# Patient Record
Sex: Female | Born: 1945
Health system: Southern US, Community
[De-identification: ages and names within clinical notes are randomized; demographics above are authoritative.]

## PROBLEM LIST (undated history)

## (undated) DIAGNOSIS — F329 Major depressive disorder, single episode, unspecified: Secondary | ICD-10-CM

## (undated) DIAGNOSIS — E785 Hyperlipidemia, unspecified: Secondary | ICD-10-CM

## (undated) DIAGNOSIS — E119 Type 2 diabetes mellitus without complications: Secondary | ICD-10-CM

## (undated) DIAGNOSIS — R519 Headache, unspecified: Secondary | ICD-10-CM

## (undated) DIAGNOSIS — K219 Gastro-esophageal reflux disease without esophagitis: Secondary | ICD-10-CM

## (undated) DIAGNOSIS — F32A Depression, unspecified: Secondary | ICD-10-CM

## (undated) DIAGNOSIS — N189 Chronic kidney disease, unspecified: Secondary | ICD-10-CM

## (undated) DIAGNOSIS — G4730 Sleep apnea, unspecified: Secondary | ICD-10-CM

## (undated) DIAGNOSIS — R51 Headache: Secondary | ICD-10-CM

## (undated) DIAGNOSIS — M797 Fibromyalgia: Secondary | ICD-10-CM

## (undated) DIAGNOSIS — J189 Pneumonia, unspecified organism: Secondary | ICD-10-CM

## (undated) DIAGNOSIS — R0600 Dyspnea, unspecified: Secondary | ICD-10-CM

## (undated) DIAGNOSIS — F419 Anxiety disorder, unspecified: Secondary | ICD-10-CM

## (undated) DIAGNOSIS — I10 Essential (primary) hypertension: Secondary | ICD-10-CM

## (undated) DIAGNOSIS — M1990 Unspecified osteoarthritis, unspecified site: Secondary | ICD-10-CM

## (undated) HISTORY — PX: CHOLECYSTECTOMY: SHX55

## (undated) HISTORY — PX: APPENDECTOMY: SHX54

## (undated) HISTORY — PX: ESOPHAGOGASTRODUODENOSCOPY: SHX1529

## (undated) HISTORY — PX: ABDOMINAL HYSTERECTOMY: SHX81

## (undated) HISTORY — PX: OTHER SURGICAL HISTORY: SHX169

## (undated) HISTORY — PX: COLONOSCOPY: SHX174

## (undated) HISTORY — PX: FLEXIBLE SIGMOIDOSCOPY: SHX1649

---

## 1974-12-19 HISTORY — PX: TOTAL VAGINAL HYSTERECTOMY: SHX2548

## 1999-04-20 ENCOUNTER — Other Ambulatory Visit: Admission: RE | Admit: 1999-04-20 | Discharge: 1999-04-20 | Payer: Self-pay | Admitting: Gastroenterology

## 1999-07-13 ENCOUNTER — Ambulatory Visit (HOSPITAL_COMMUNITY): Admission: RE | Admit: 1999-07-13 | Discharge: 1999-07-13 | Payer: Self-pay | Admitting: Gastroenterology

## 1999-07-13 ENCOUNTER — Encounter: Payer: Self-pay | Admitting: Gastroenterology

## 1999-08-09 ENCOUNTER — Encounter: Payer: Self-pay | Admitting: Gastroenterology

## 1999-08-09 ENCOUNTER — Ambulatory Visit (HOSPITAL_COMMUNITY): Admission: RE | Admit: 1999-08-09 | Discharge: 1999-08-09 | Payer: Self-pay | Admitting: Gastroenterology

## 2000-03-22 ENCOUNTER — Encounter: Payer: Self-pay | Admitting: Gastroenterology

## 2000-03-22 ENCOUNTER — Ambulatory Visit (HOSPITAL_COMMUNITY): Admission: RE | Admit: 2000-03-22 | Discharge: 2000-03-22 | Payer: Self-pay | Admitting: Gastroenterology

## 2004-09-20 ENCOUNTER — Ambulatory Visit: Payer: Self-pay | Admitting: Internal Medicine

## 2004-10-21 ENCOUNTER — Other Ambulatory Visit: Payer: Self-pay

## 2004-10-21 ENCOUNTER — Emergency Department: Payer: Self-pay | Admitting: Emergency Medicine

## 2004-10-21 IMAGING — CT CT CHEST W/ CM
1 of 2 series · 15 of 32 positions shown, 19 images · IV contrast (APPLIED)
Comparison: none

REASON FOR EXAM: pe
COMMENTS:

[Series 6: inspace · axial · 0.66mm/px · z∈[-303,-33]mm · 15 of 425 slices shown, 19 images]
[im 20/425  mediastinal]
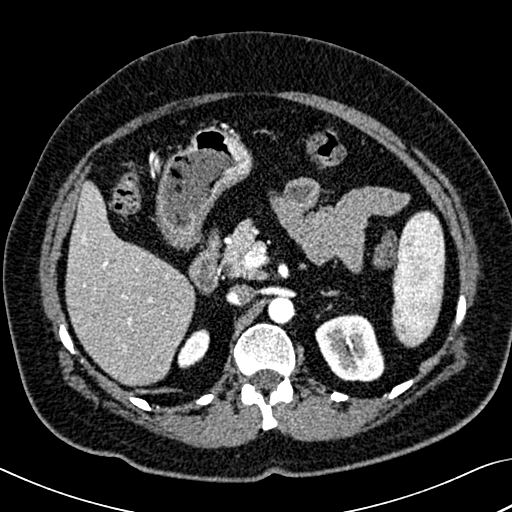
[im 20/425  lung]
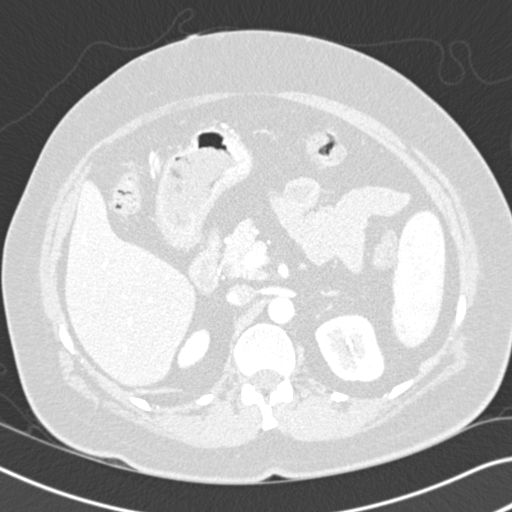
[im 58/425  lung]
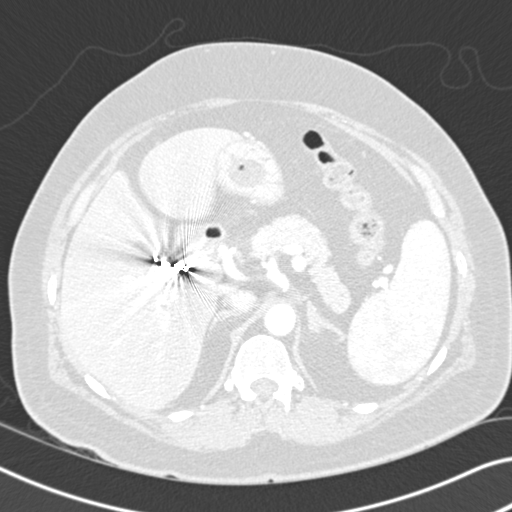
[im 97/425  lung]
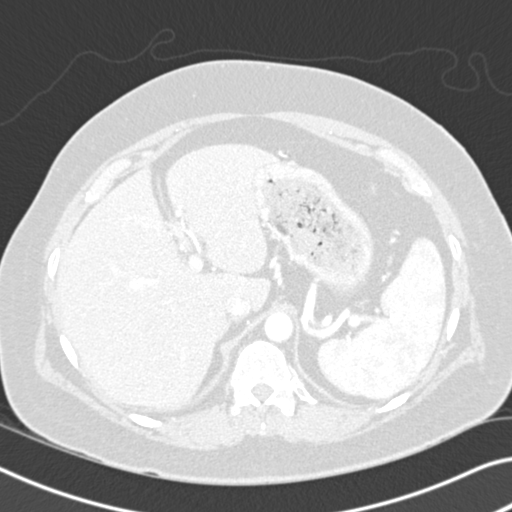
[im 107/425  lung]
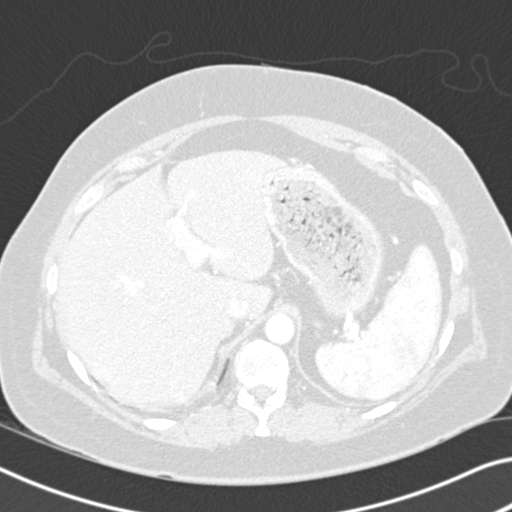
[im 135/425  mediastinal]
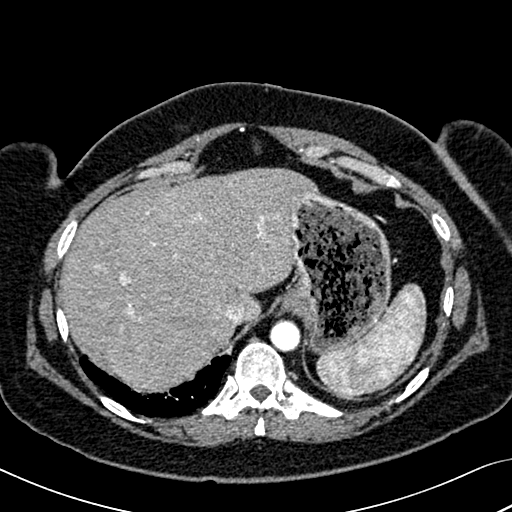
[im 135/425  lung]
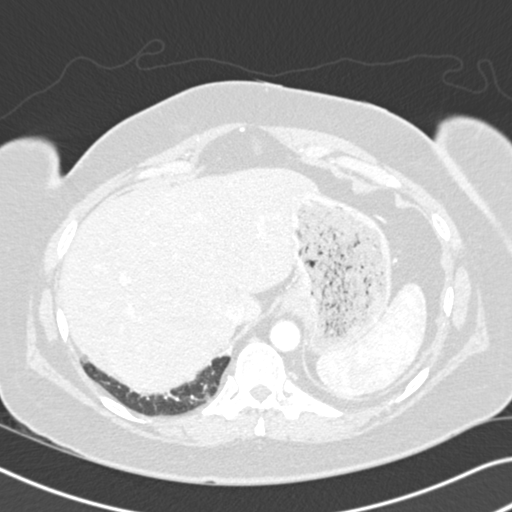
[im 155/425  lung]
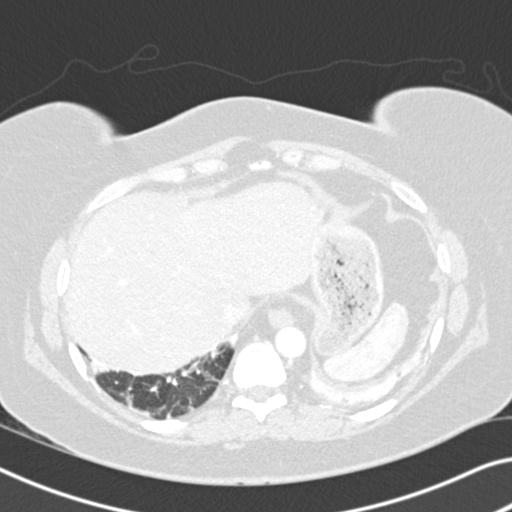
[im 193/425  lung]
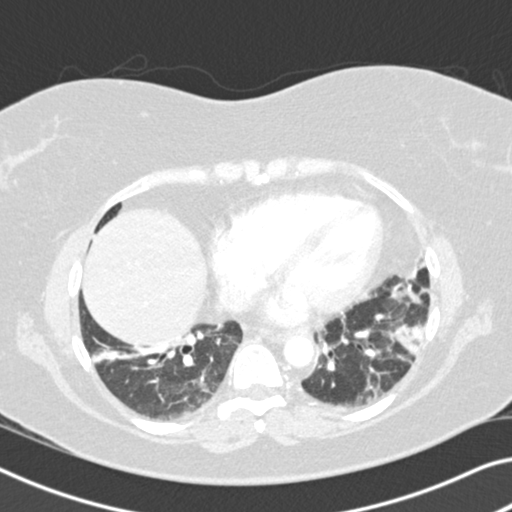
[im 213/425  lung]
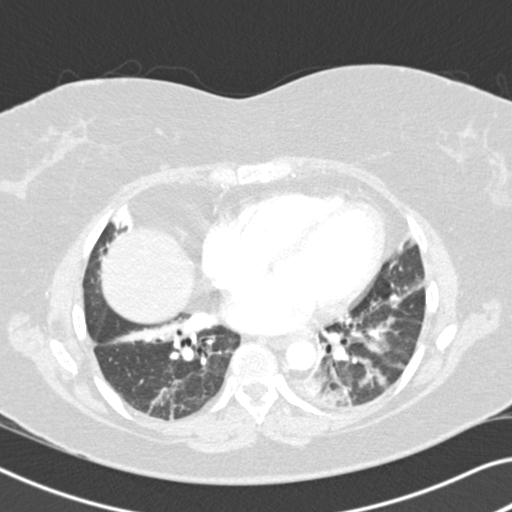
[im 232/425  mediastinal]
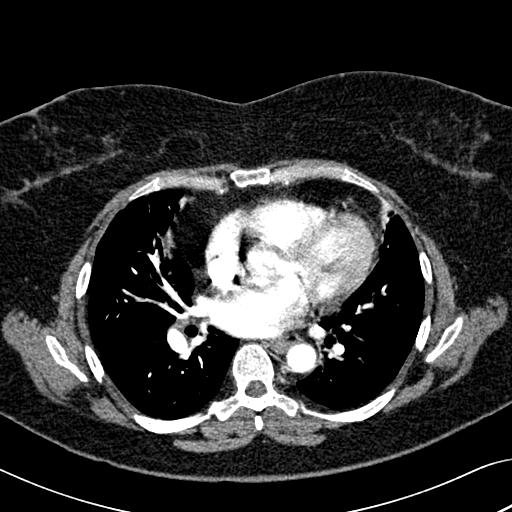
[im 232/425  lung]
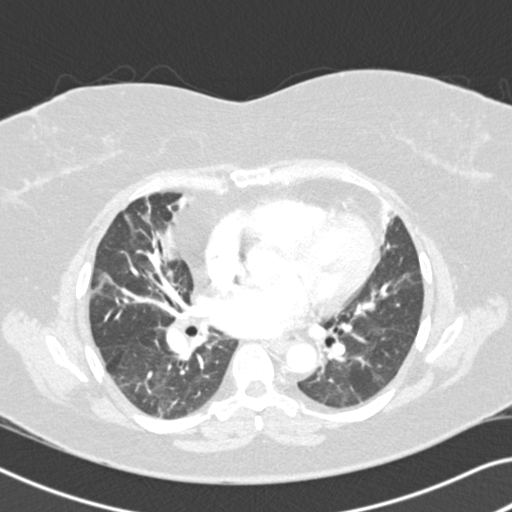
[im 270/425  lung]
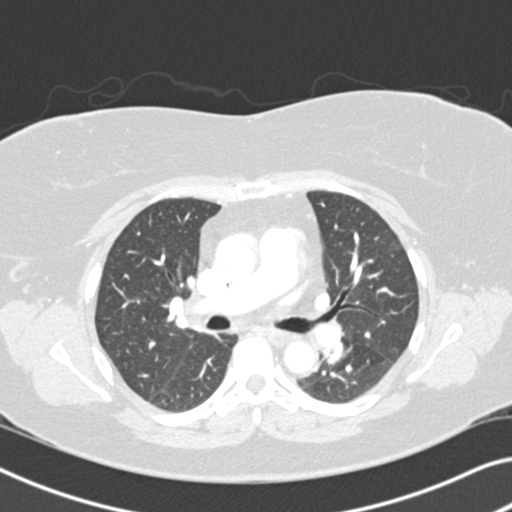
[im 290/425  lung]
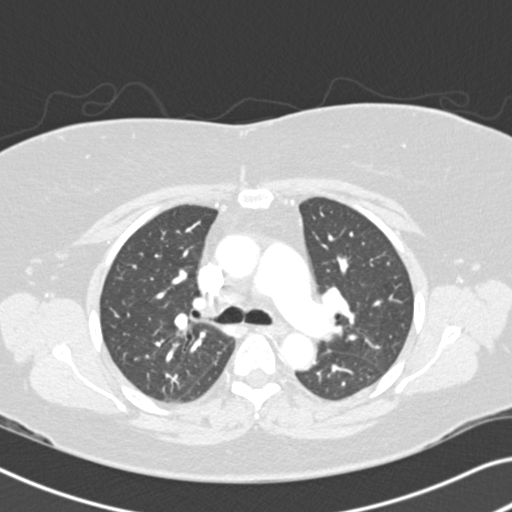
[im 319/425  lung]
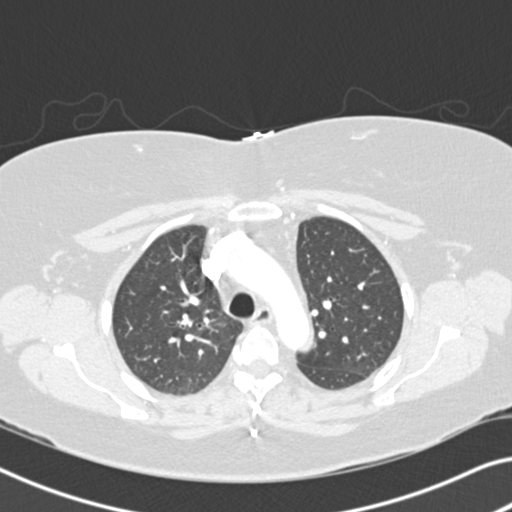
[im 347/425  mediastinal]
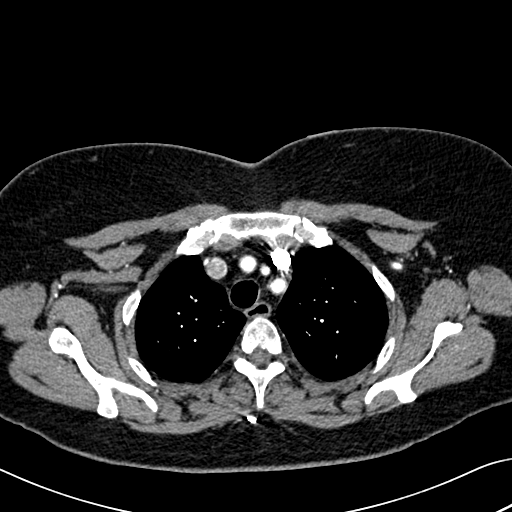
[im 347/425  lung]
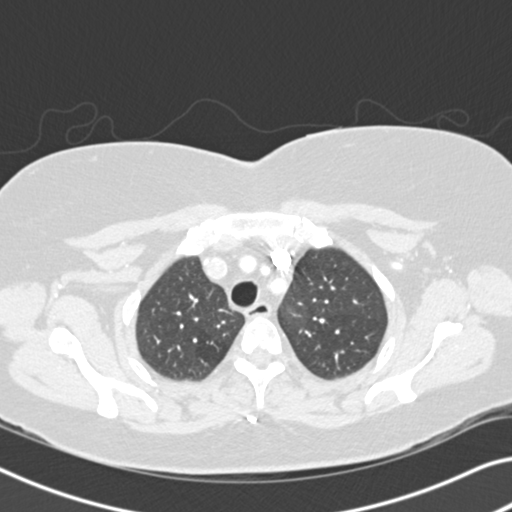
[im 367/425  lung]
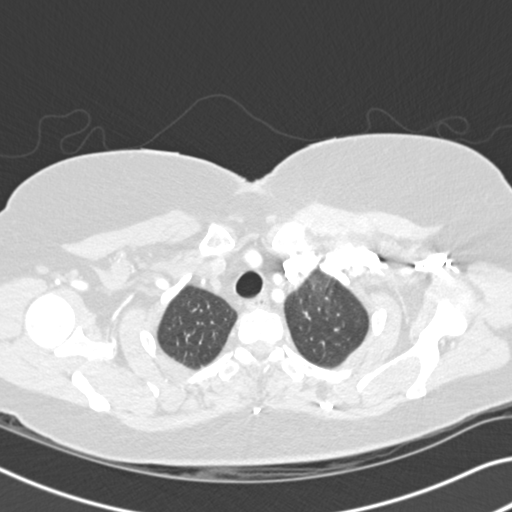
[im 405/425  lung]
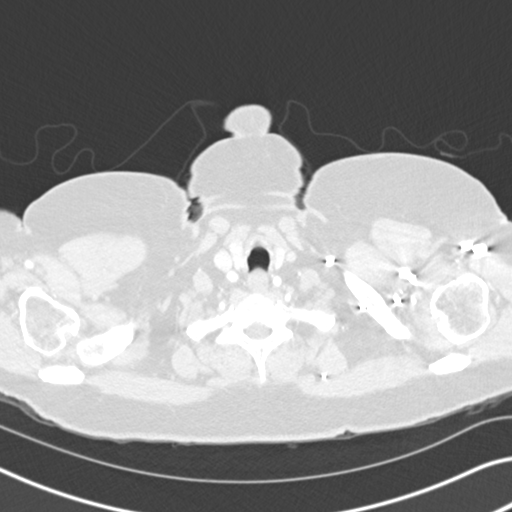

[15 of 32 positions shown; findings below may reference images not displayed]

PROCEDURE:     CT  - CT CHEST (FOR PE) W  - [DATE] [DATE]

RESULT:     IV contrast enhanced CT scan of chest shows no evidence of
filling defect within the pulmonary arteries to suggest a diagnosis of
pulmonary embolism. There appears to be shallow inspiratory effort and there
are some predominantly linear bands of density in the lung bases. The
appearance suggests bibasilar atelectasis. If the patient is experiencing
evidence of an acute infectious process either via laboratory data or
clinical history, the possibility of some mild bibasilar pneumonia cannot be
excluded. No pneumothorax or significant effusion is seen. The upper lung
zones appear to be clear. There is no interstitial edema. The upper
abdominal viscera included on the study appear to be within normal limits.
Cholecystectomy clips are noted.
IMPRESSION: 1.     No evidence of P.E.
2.     Bibasilar areas of atelectasis.
3.     Status post cholecystectomy.
4.     Findings were phoned to the [HOSPITAL] the completion of
the examination.

## 2004-12-16 ENCOUNTER — Ambulatory Visit: Payer: Self-pay | Admitting: Internal Medicine

## 2004-12-16 IMAGING — CT CT ABD-PELV W/ CM
1 of 3 series · 15 of 32 positions shown, 19 images · non-contrast
Comparison: none

REASON FOR EXAM: right midline abdominal pain
COMMENTS:

PROCEDURE:     CT  - CT ABDOMEN / PELVIS  W  - [DATE]  [DATE]
RESULT:     The patient is experiencing diffuse abdominal pain.
TECHNIQUE: Axial images were obtained from the hemidiaphragm to the pubic
symphysis post intravenous and oral administration of contrast.

[Series 3: inspace · axial · 0.63mm/px · z∈[-45,+389]mm · 15 of 490 slices shown, 19 images]
[im 37/490  soft-tissue]
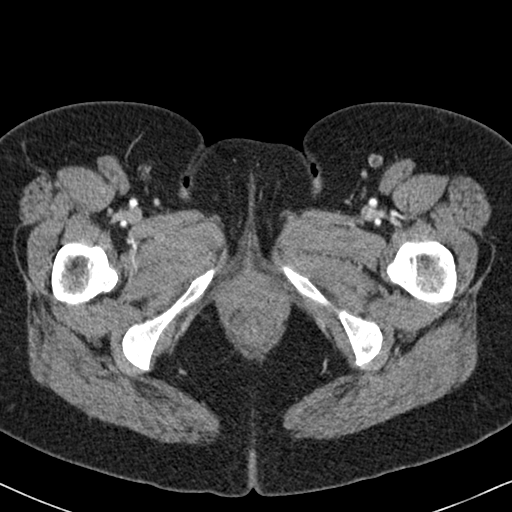
[im 37/490  bone]
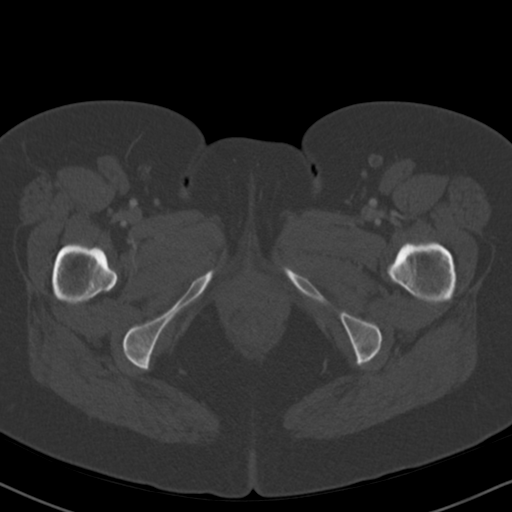
[im 73/490  soft-tissue]
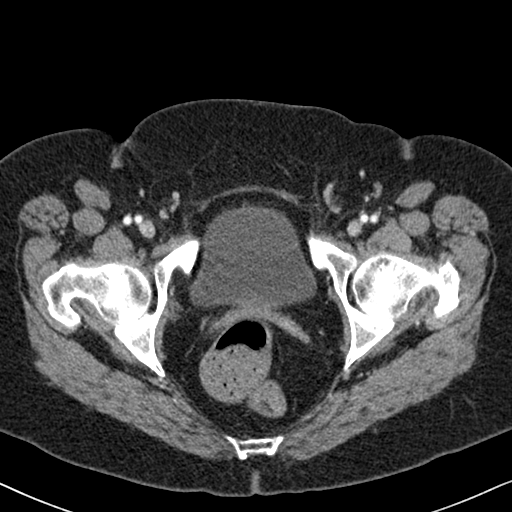
[im 109/490  soft-tissue]
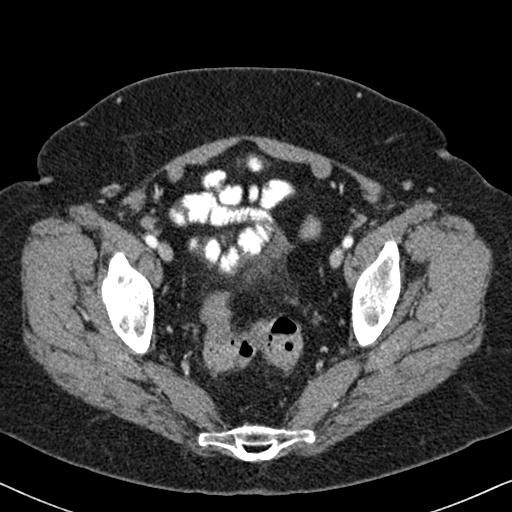
[im 145/490  soft-tissue]
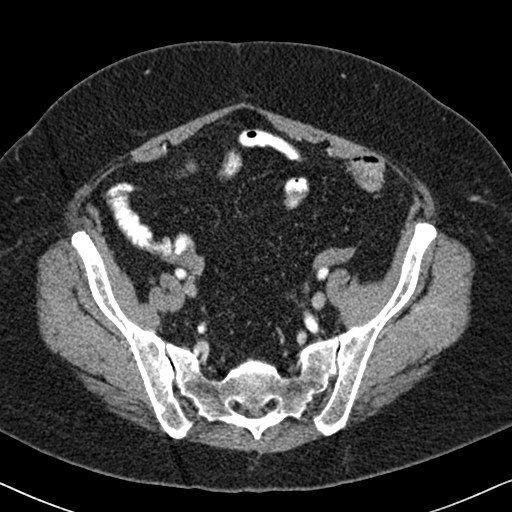
[im 182/490  soft-tissue]
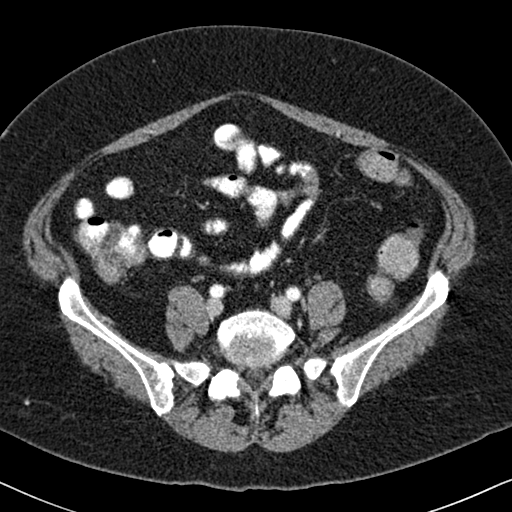
[im 218/490  soft-tissue]
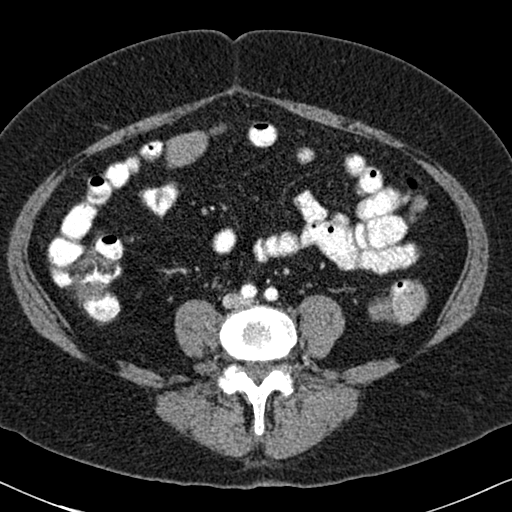
[im 254/490  soft-tissue]
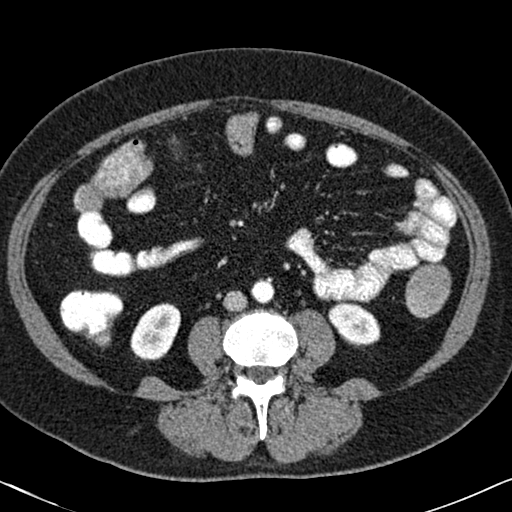
[im 290/490  soft-tissue]
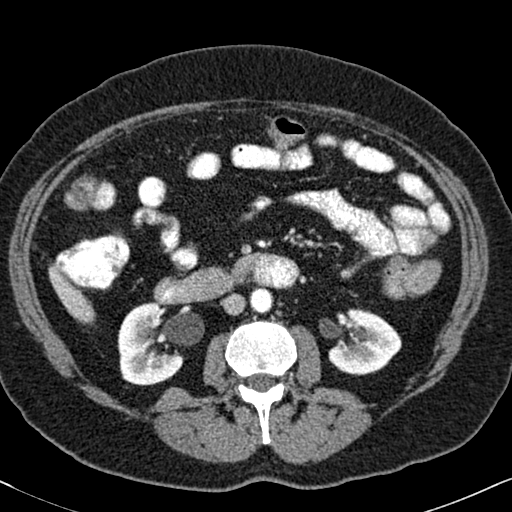
[im 327/490  soft-tissue]
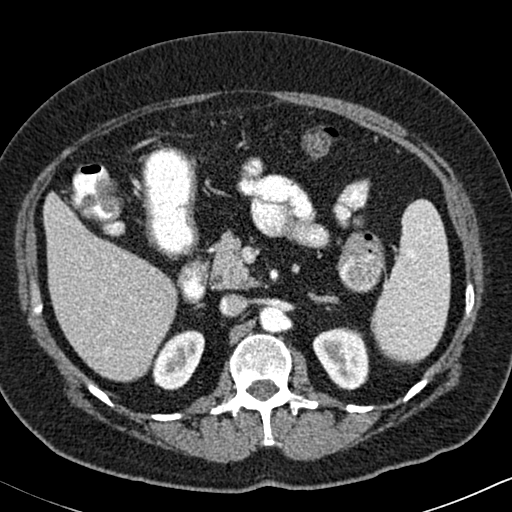
[im 327/490  bone]
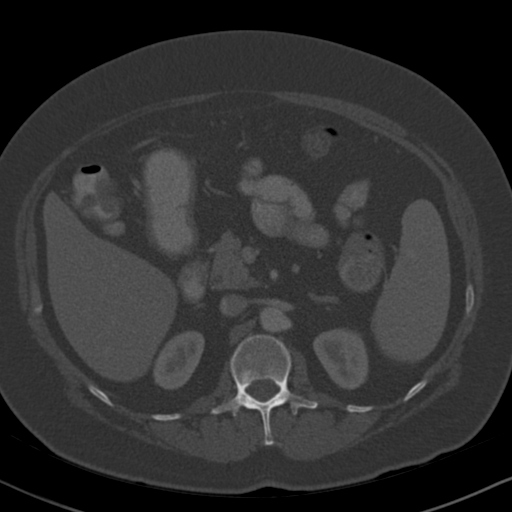
[im 363/490  soft-tissue]
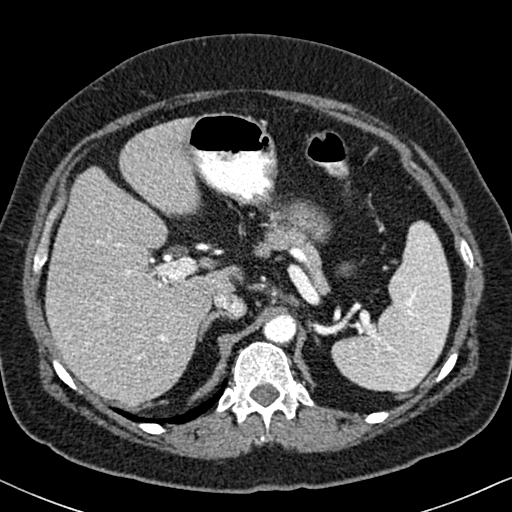
[im 399/490  soft-tissue]
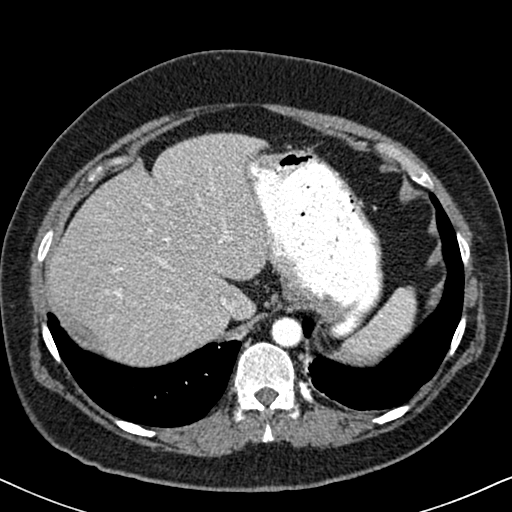
[im 417/490  lung]
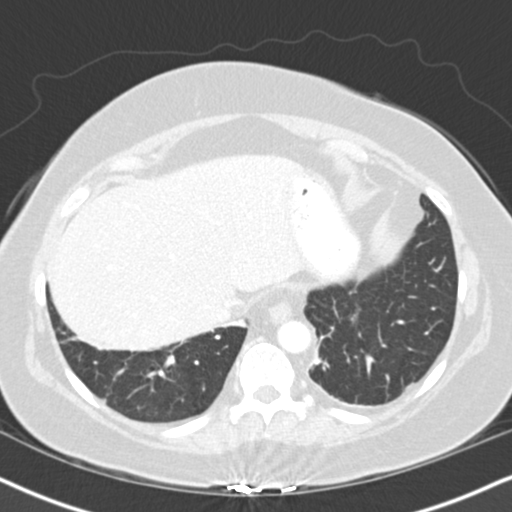
[im 435/490  soft-tissue]
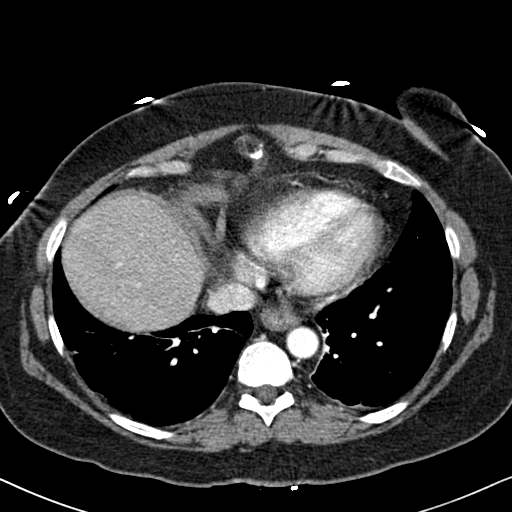
[im 435/490  lung]
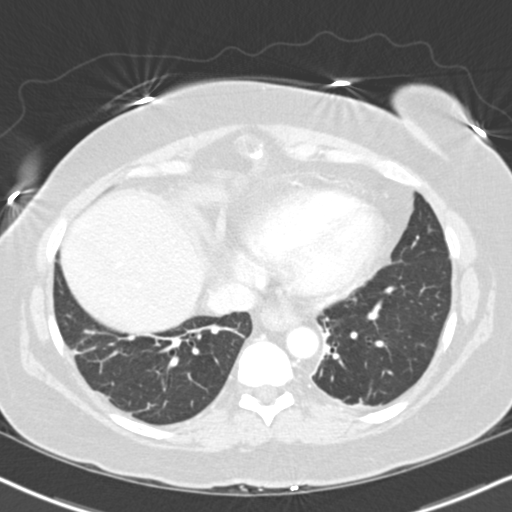
[im 453/490  lung]
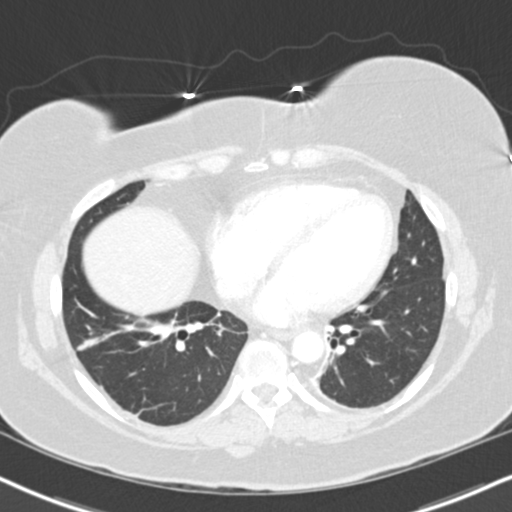
[im 471/490  soft-tissue]
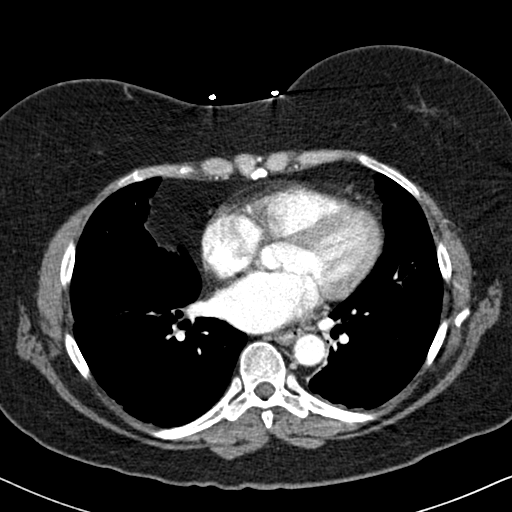
[im 471/490  lung]
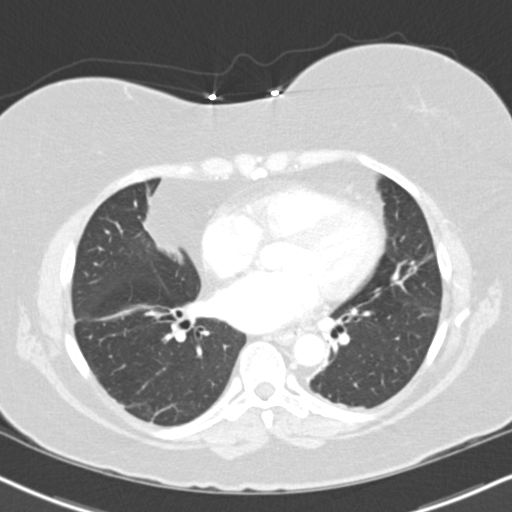

[15 of 32 positions shown; findings below may reference images not displayed]

FINDINGS: The liver and spleen appear intact. Surgical clips are noted in
the gallbladder fossa consistent with prior cholecystectomy. The pancreas is
intact. No focal masses are noted. Both kidneys excrete contrast and no
masses are identified. There is an extrarenal pelvis seen on the RIGHT as
well as a small one on the LEFT. No intra-abdominal masses are seen. No
retroperitoneal adenopathy is noted. In the pelvis, no adenopathy is
identified. No fluid is seen within the abdomen and/or pelvis. No dirtiness
to the fat is noted to suggest an inflammatory response.

In the lung bases, there is noted some linear atelectasis and/or scarring
bilaterally. No effusions are seen.
IMPRESSION: No significant abnormality is identified on the abdominal or
pelvic CT.

## 2005-03-04 ENCOUNTER — Ambulatory Visit: Payer: Self-pay | Admitting: Unknown Physician Specialty

## 2005-03-04 IMAGING — US ABDOMEN ULTRASOUND
1 series · 17 of 25 positions shown · non-contrast
Comparison: none

REASON FOR EXAM: abd pn
COMMENTS:

[Series 1: abdomen ultrasound · 17 of 45 slices shown]
[im 1/45]
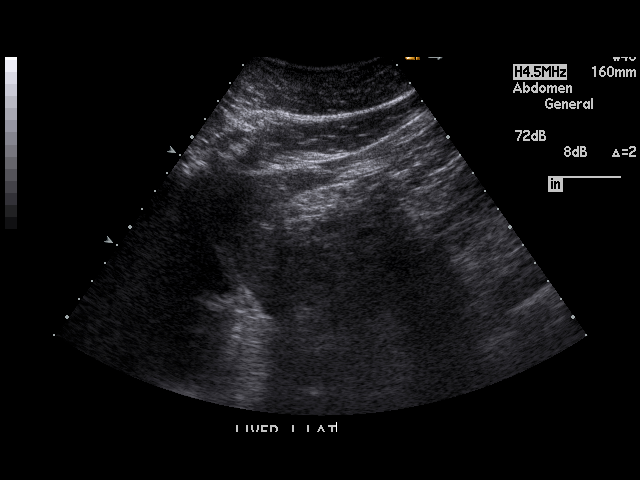
[im 4/45]
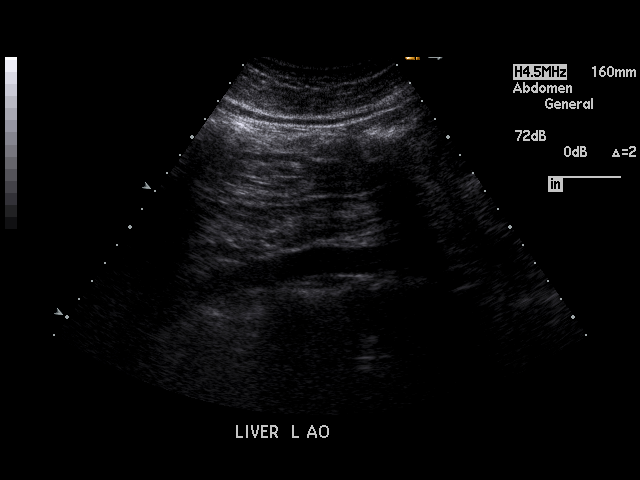
[im 6/45]
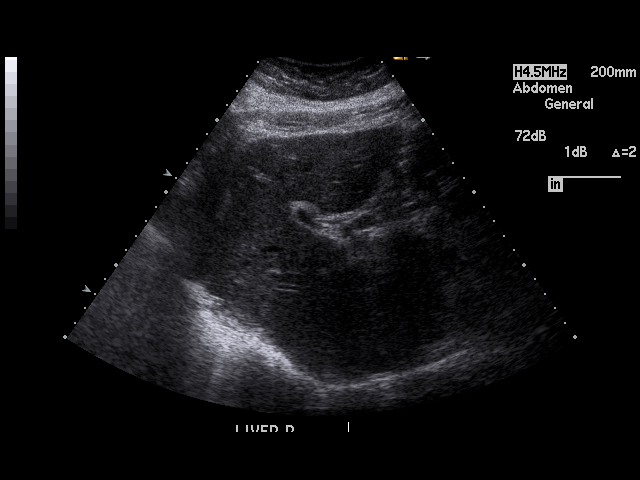
[im 10/45]
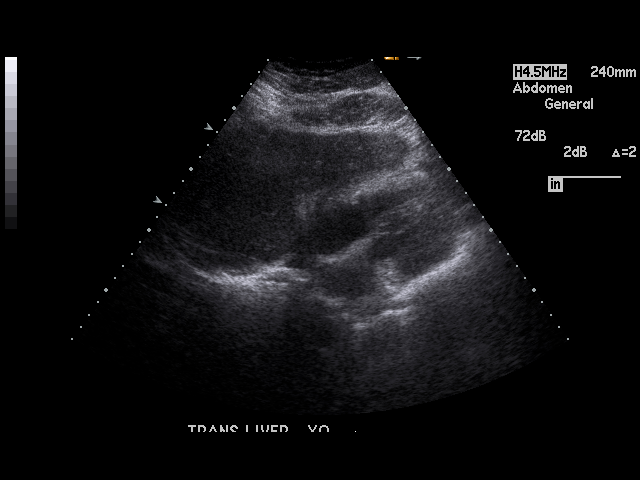
[im 12/45]
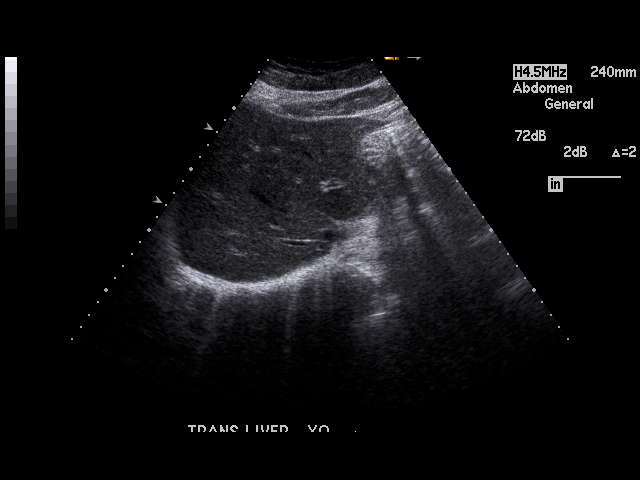
[im 15/45]
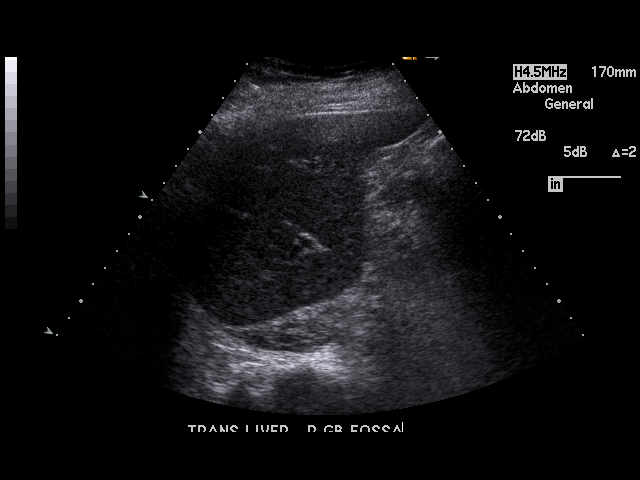
[im 17/45]
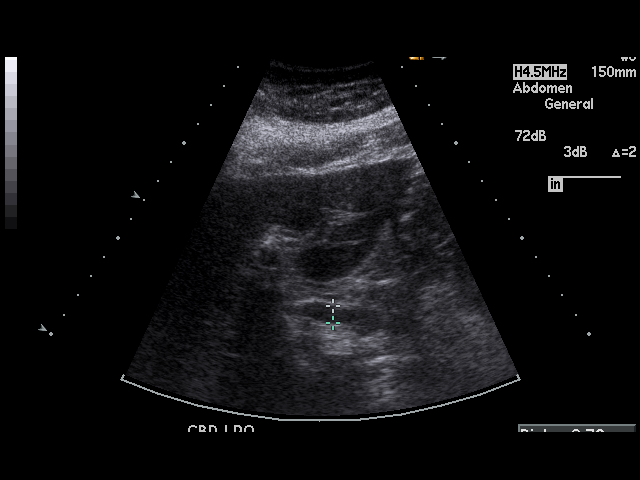
[im 21/45]
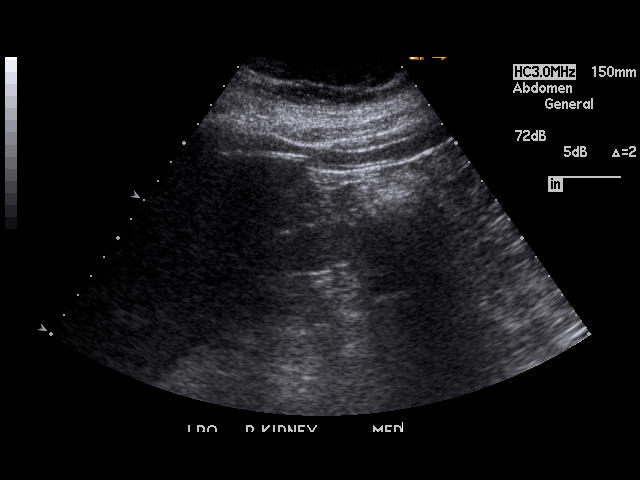
[im 23/45]
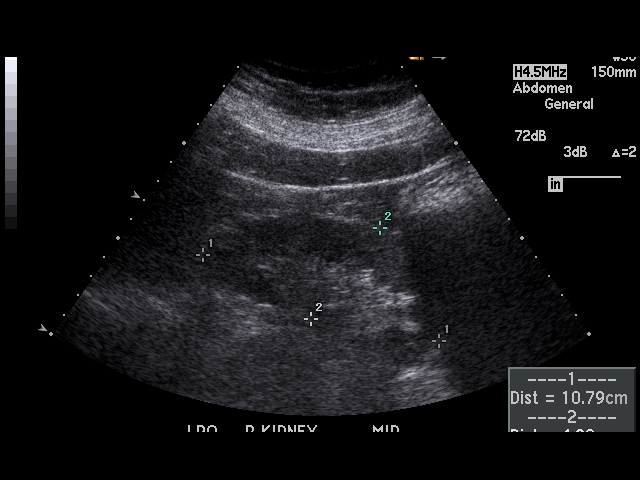
[im 24/45]
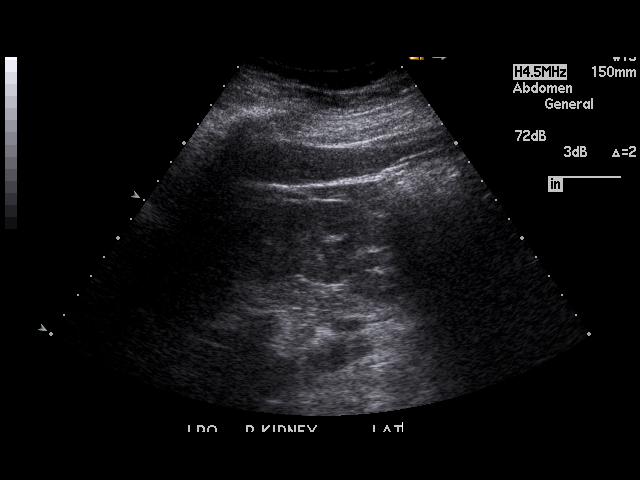
[im 28/45]
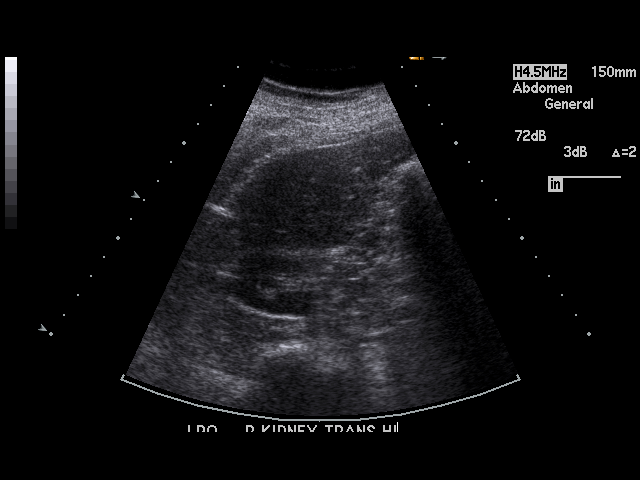
[im 30/45]
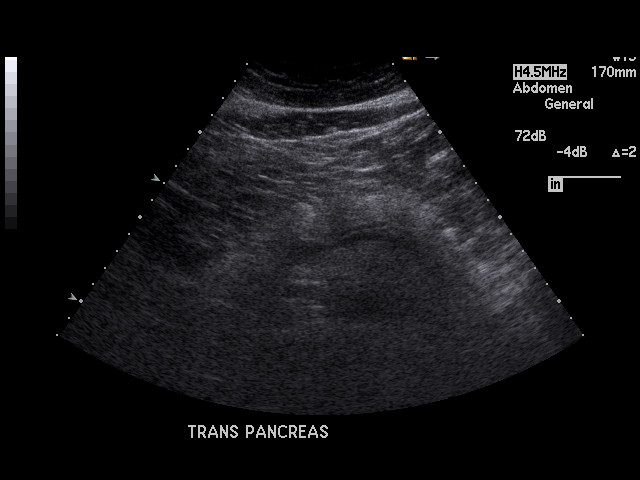
[im 34/45]
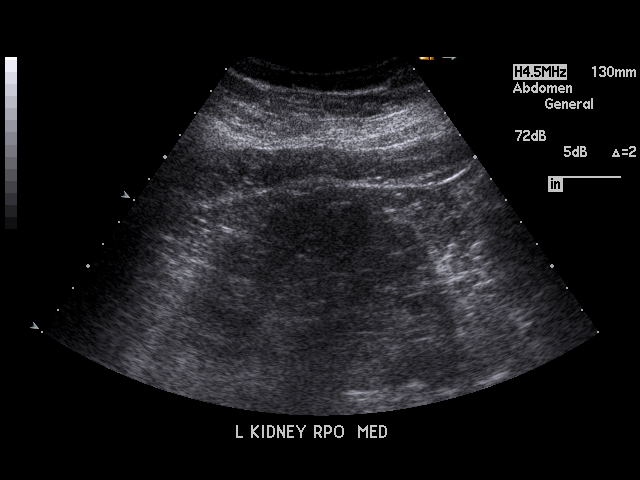
[im 35/45]
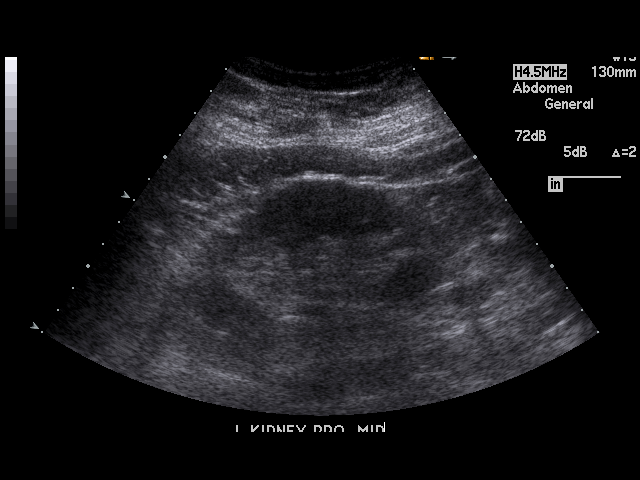
[im 39/45]
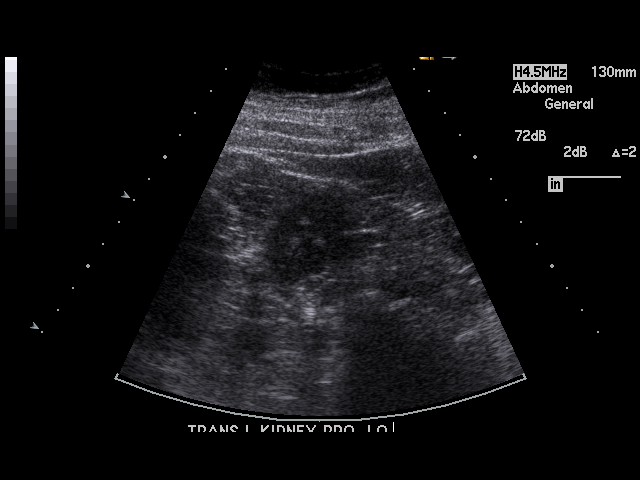
[im 41/45]
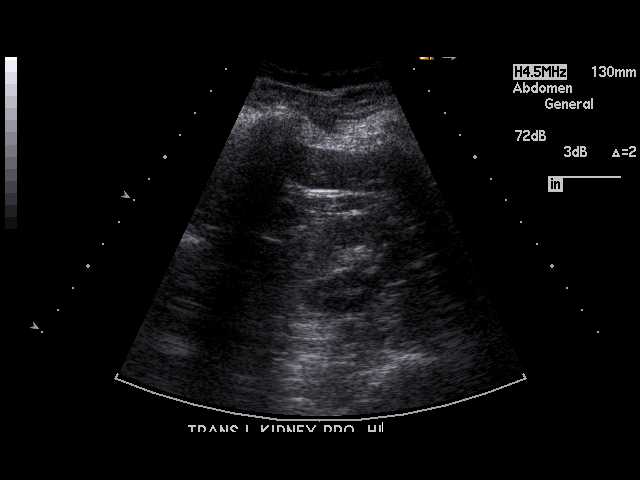
[im 45/45]
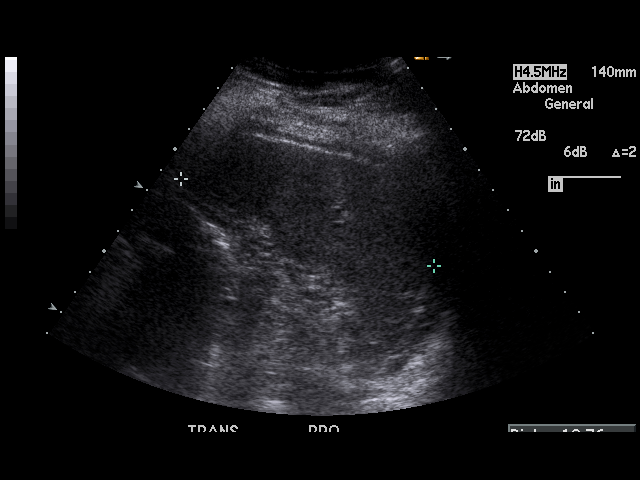

[17 of 25 positions shown; findings below may reference images not displayed]

PROCEDURE:     US  - US ABDOMEN GENERAL SURVEY  - [DATE]  [DATE]

RESULT:     The hepatic echo pattern is normal in appearance. The spleen is
upper limits to normal in size.  The pancreas is normal in appearance. The
gallbladder is not seen compatible with prior cholecystectomy. The common
bile duct measures 7.4 mm in diameter which is within normal limits. The
kidneys show no hydronephrosis. There is no ascites.
IMPRESSION: 1)The patient is status post cholecystectomy.

2)The spleen is upper limits to normal in size or possibly slightly
enlarged.

3)There is no ascites.

## 2005-03-11 ENCOUNTER — Ambulatory Visit: Payer: Self-pay | Admitting: Unknown Physician Specialty

## 2005-04-29 ENCOUNTER — Ambulatory Visit: Payer: Self-pay | Admitting: Unknown Physician Specialty

## 2005-05-26 ENCOUNTER — Ambulatory Visit: Payer: Self-pay | Admitting: General Surgery

## 2005-06-23 ENCOUNTER — Other Ambulatory Visit: Payer: Self-pay

## 2005-06-23 IMAGING — CR DG CHEST 2V
1 series · 2 of 2 positions shown · non-contrast
Comparison: none

REASON FOR EXAM: SOB
COMMENTS:

[Series 1: view not recorded · 0.17mm/px · 2 of 2 slices shown]
[im 1/2]
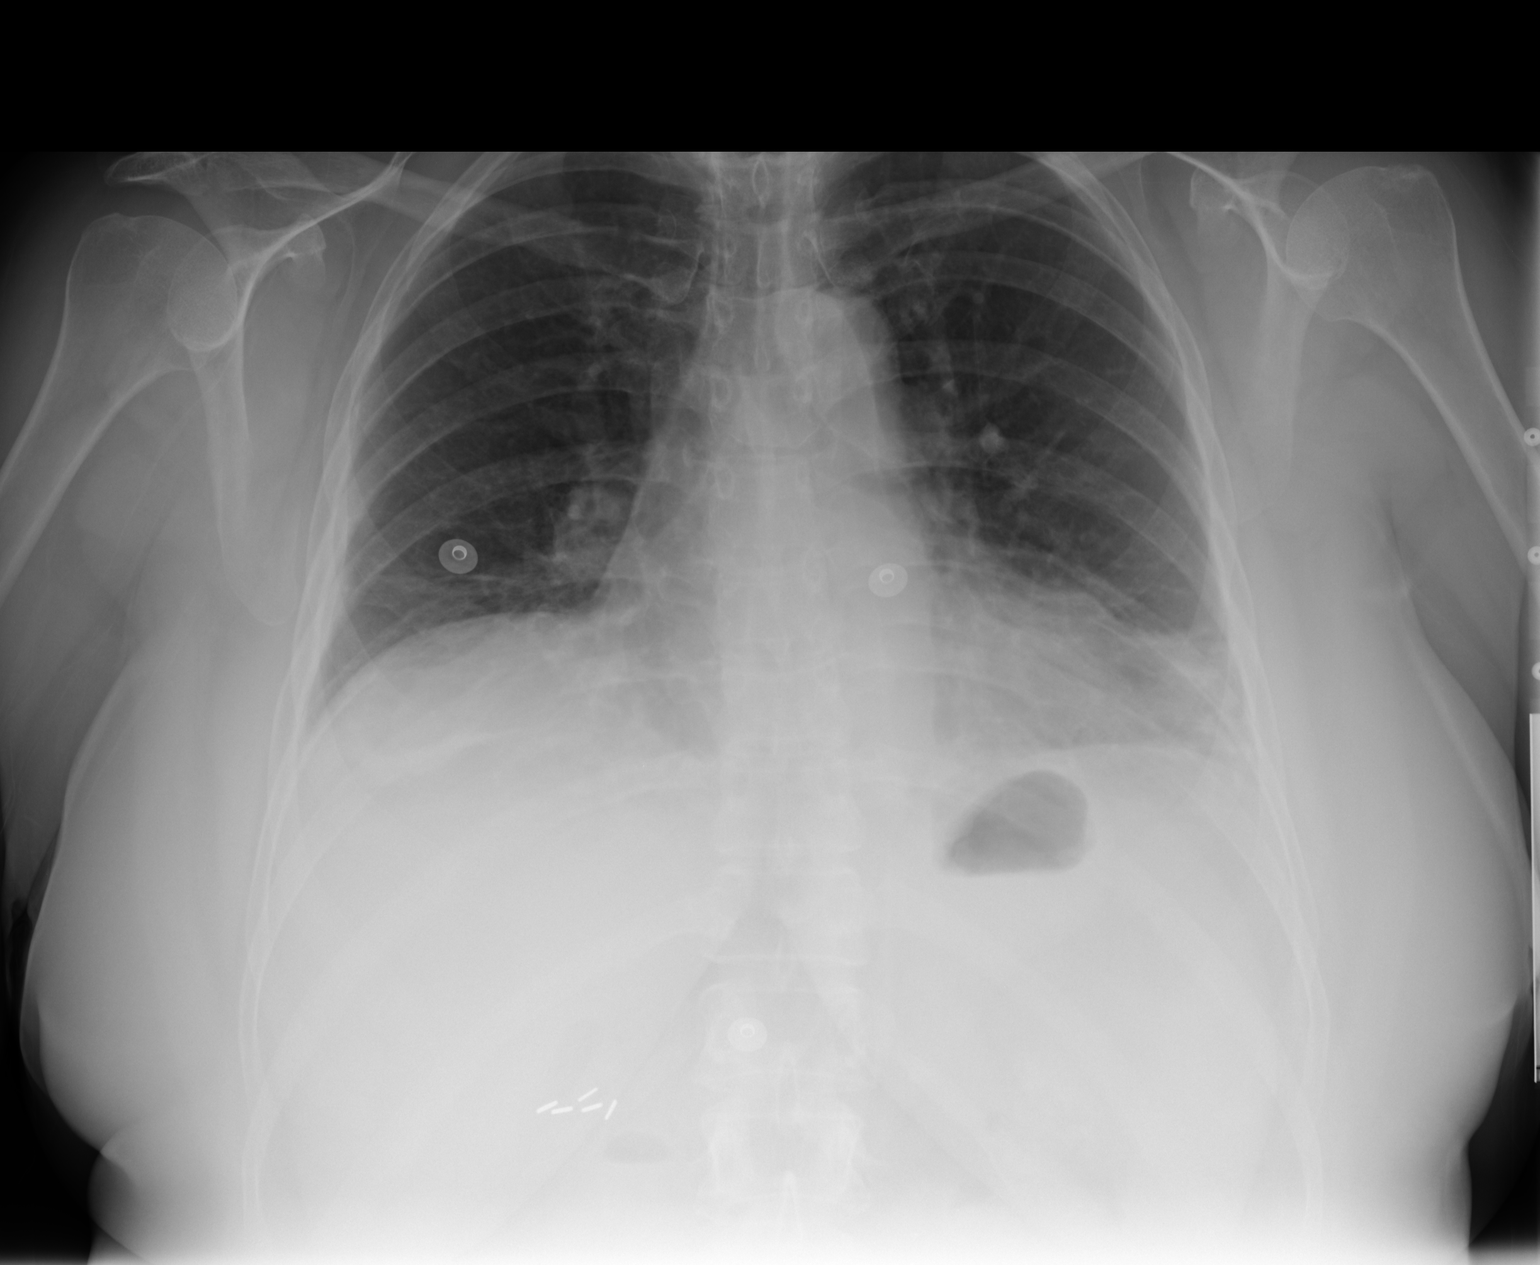
[im 2/2]
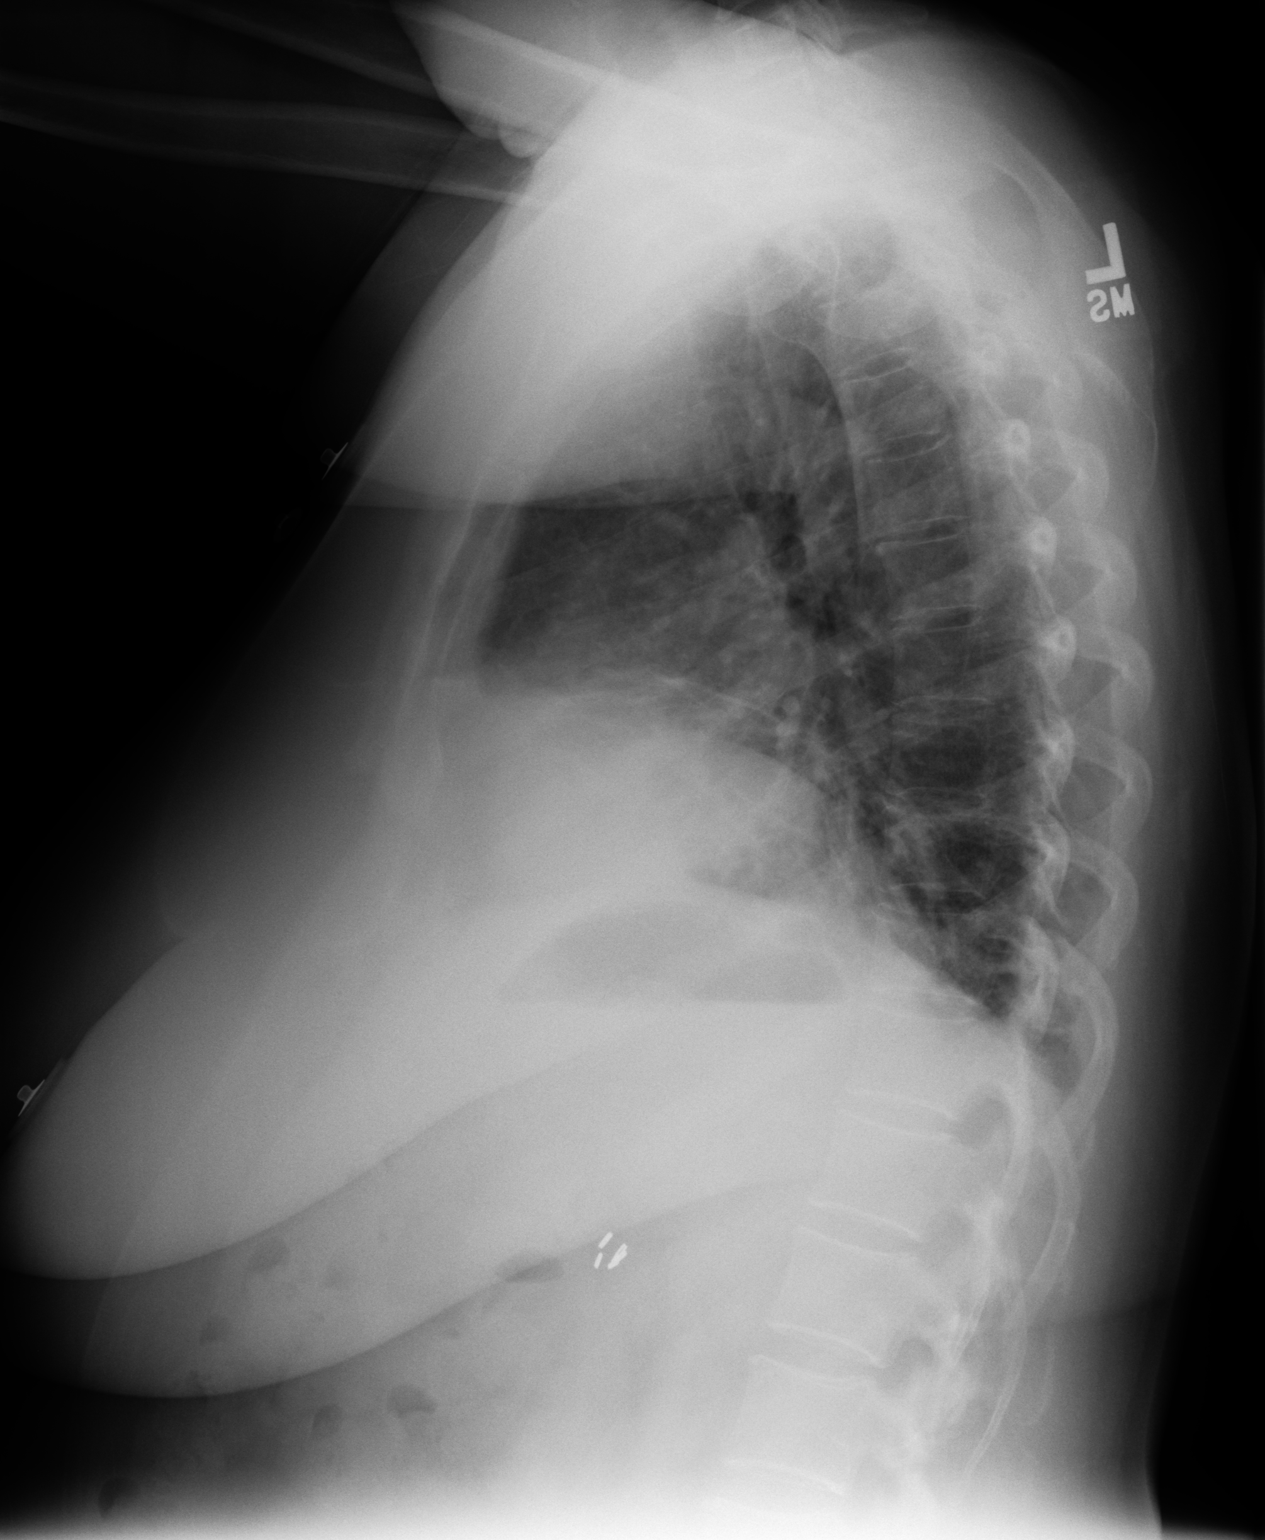

[2 of 2 positions shown; findings below may reference images not displayed]

PROCEDURE:     DXR - DXR CHEST PA (OR AP) AND LATERAL  - [DATE] [DATE]

RESULT:     Subsegmental atelectasis is noted in the lung bases.  The
patient is taking a poor respiratory effort.  There is pleural parenchymal
thickening in the LEFT lung base which is present on the prior study of
[DATE] and is again present.  This region was clear on a prior chest x-ray
of [DP].  These changes could represent chronic atelectasis with recurrent
pneumonia/atelectasis.  If bronchoscopy has not been performed on this
patient this might be useful to exclude a central obstructing lesion.
Reference is made to the CT chest report.
IMPRESSION: LEFT lower lobe atelectasis versus pneumonia.  See discussion above.

## 2005-06-24 ENCOUNTER — Inpatient Hospital Stay: Payer: Self-pay | Admitting: Internal Medicine

## 2005-06-24 IMAGING — CT CT CHEST W/ CM
1 series · 16 of 33 positions shown, 20 images · IV contrast (APPLIED)
Comparison: none

REASON FOR EXAM: Chest pain, shortness of breath, PE protocol     rm 5
COMMENTS:

[Series 5: soft tissue · axial · 0.73mm/px · z∈[-314,-44]mm · 16 of 98 slices shown, 20 images]
[im 4/98  mediastinal]
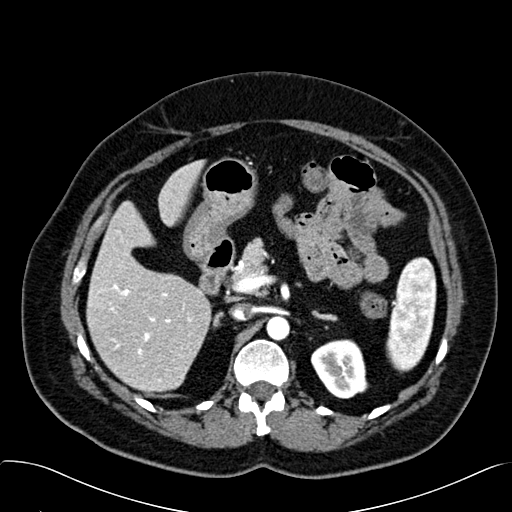
[im 4/98  lung]
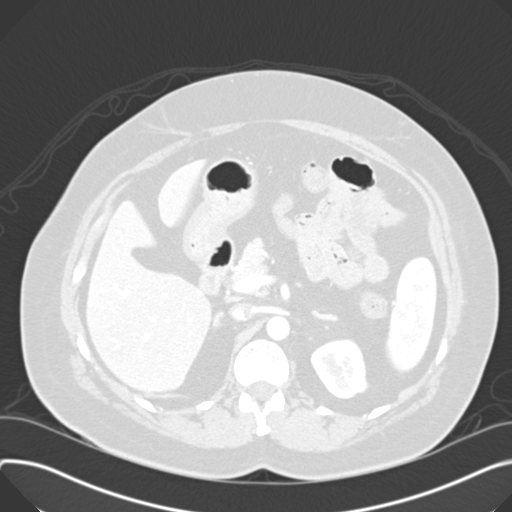
[im 11/98  lung]
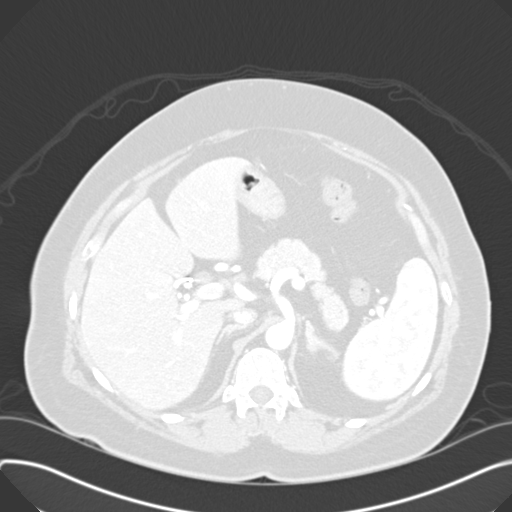
[im 18/98  lung]
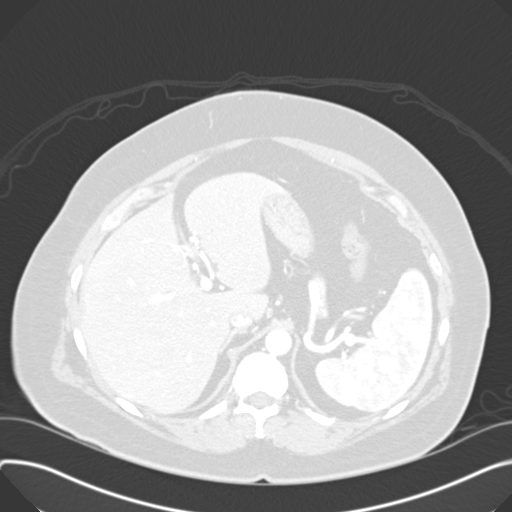
[im 22/98  lung]
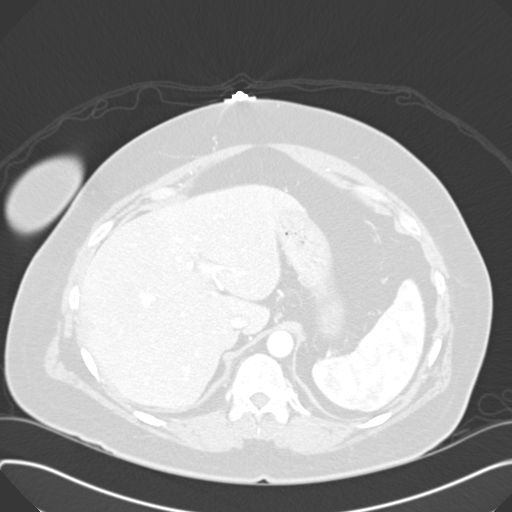
[im 29/98  mediastinal]
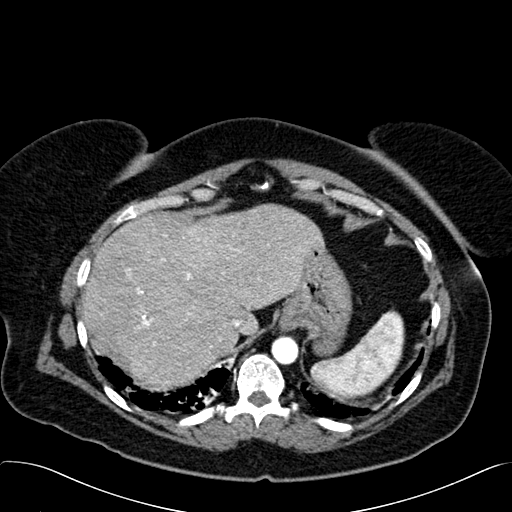
[im 29/98  lung]
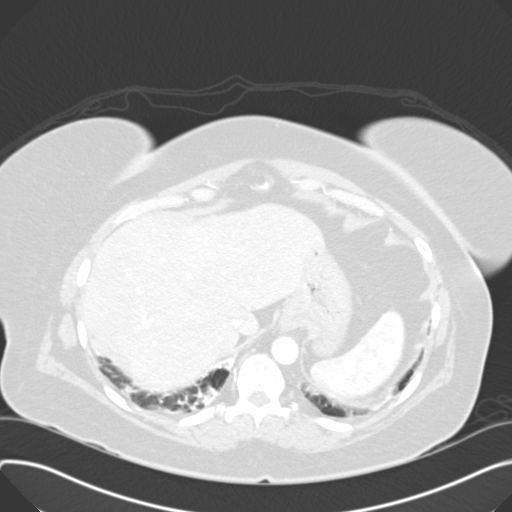
[im 36/98  lung]
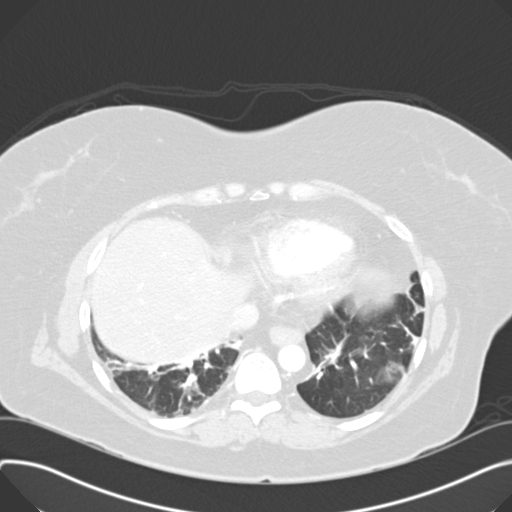
[im 40/98  lung]
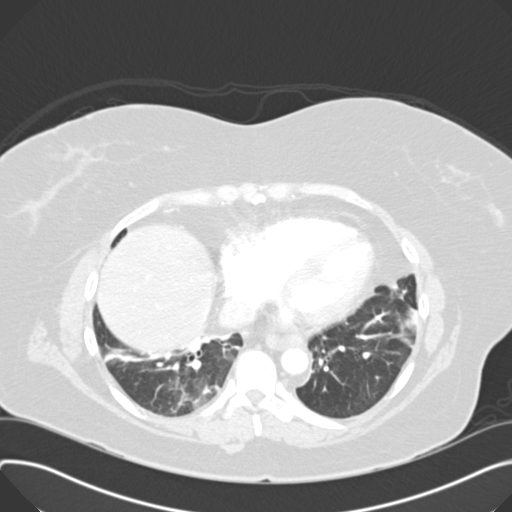
[im 47/98  lung]
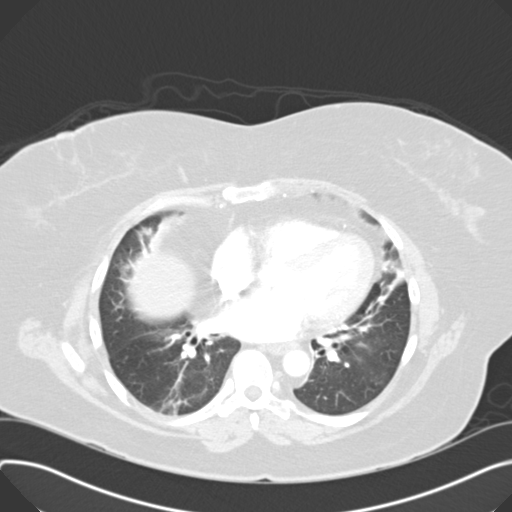
[im 52/98  mediastinal]
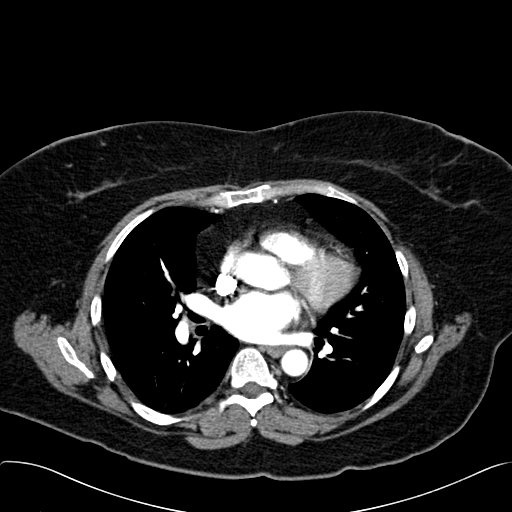
[im 52/98  lung]
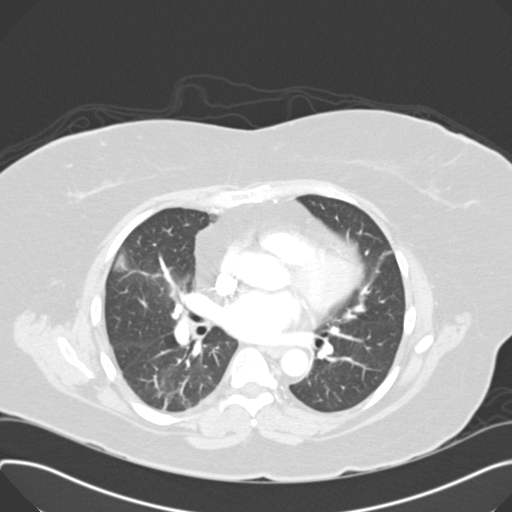
[im 58/98  lung]
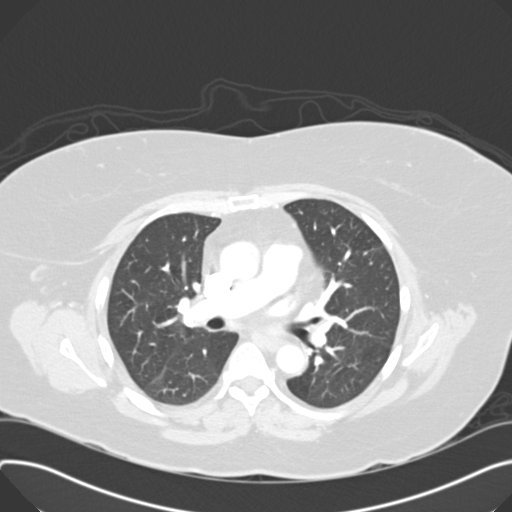
[im 62/98  lung]
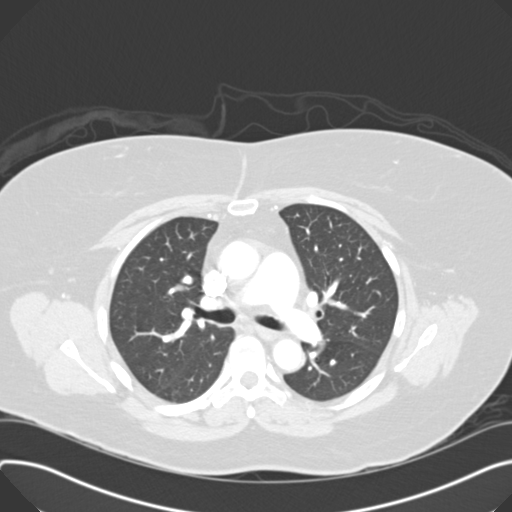
[im 69/98  lung]
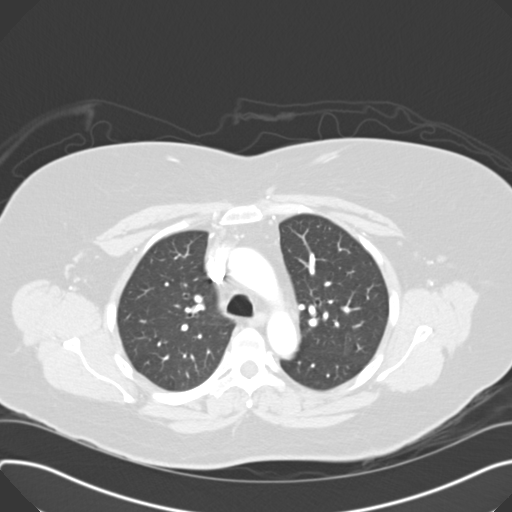
[im 76/98  mediastinal]
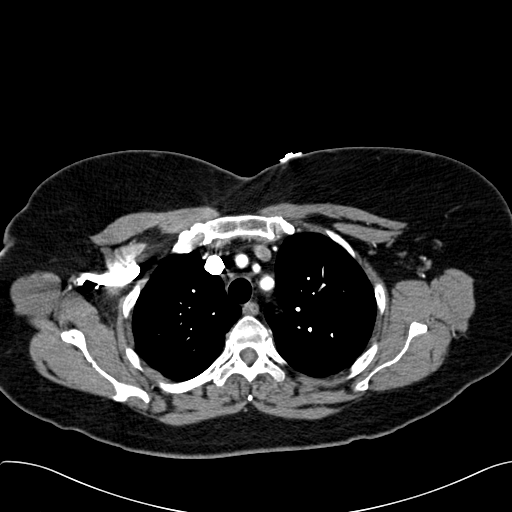
[im 76/98  lung]
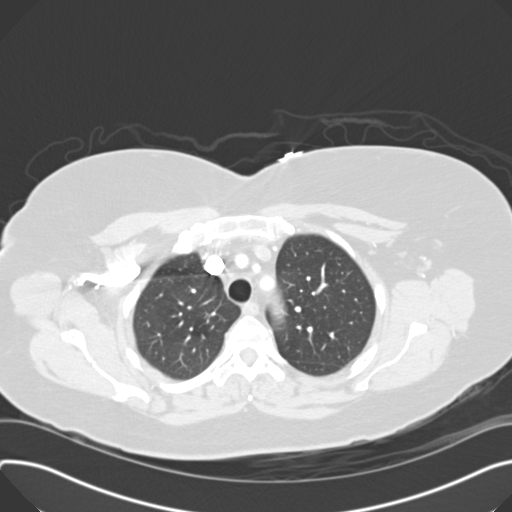
[im 80/98  lung]
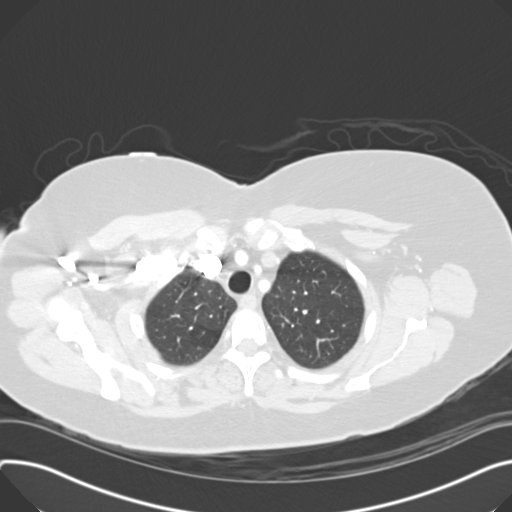
[im 87/98  lung]
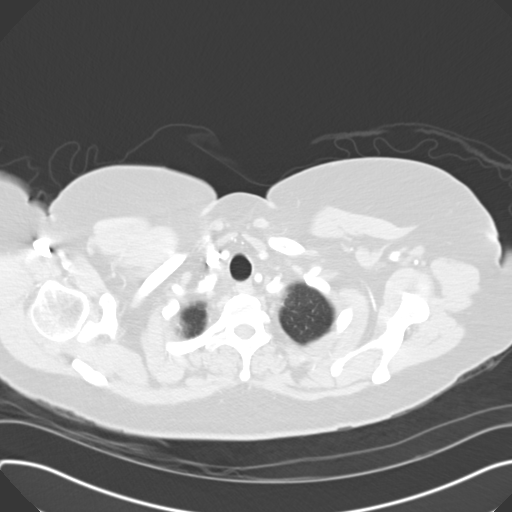
[im 94/98  lung]
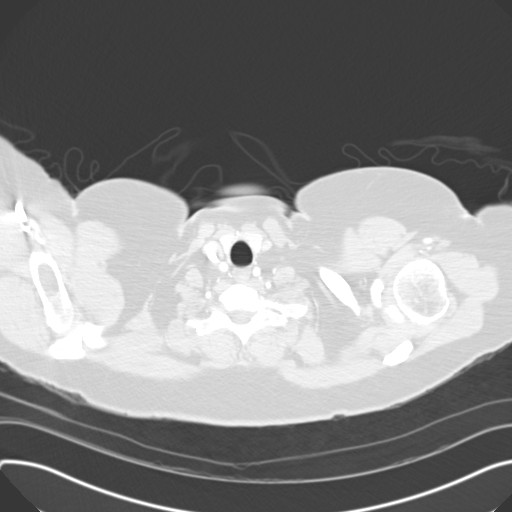

[16 of 33 positions shown; findings below may reference images not displayed]

PROCEDURE:     CT  - CT CHEST (FOR PE) W  - [DATE]  [DATE]

RESULT:     IV contrast-enhanced chest CT is obtained.  There is no
pulmonary embolus.  The pulmonary arteries are normal.  The adrenals are
normal.  There is no bowel obstruction.  Basilar atelectasis and/or scarring
is present bilaterally.  Large airways are patent.  No focal alveolar
infiltrates are noted.  The basilar atelectasis is prominent.  A component
of scarring may be present.  The initial report was given by the [HOSPITAL]
Group at the time of the study.
IMPRESSION: Prominent basilar atelectasis.  Large airways are patent.

No evidence of pulmonary embolus.

## 2005-06-25 IMAGING — CR DG CHEST 2V
1 series · 2 of 2 positions shown · non-contrast
Comparison: none

REASON FOR EXAM: Pneumonia
COMMENTS:

[Series 1: view not recorded · 0.17mm/px · 2 of 2 slices shown]
[im 1/2]
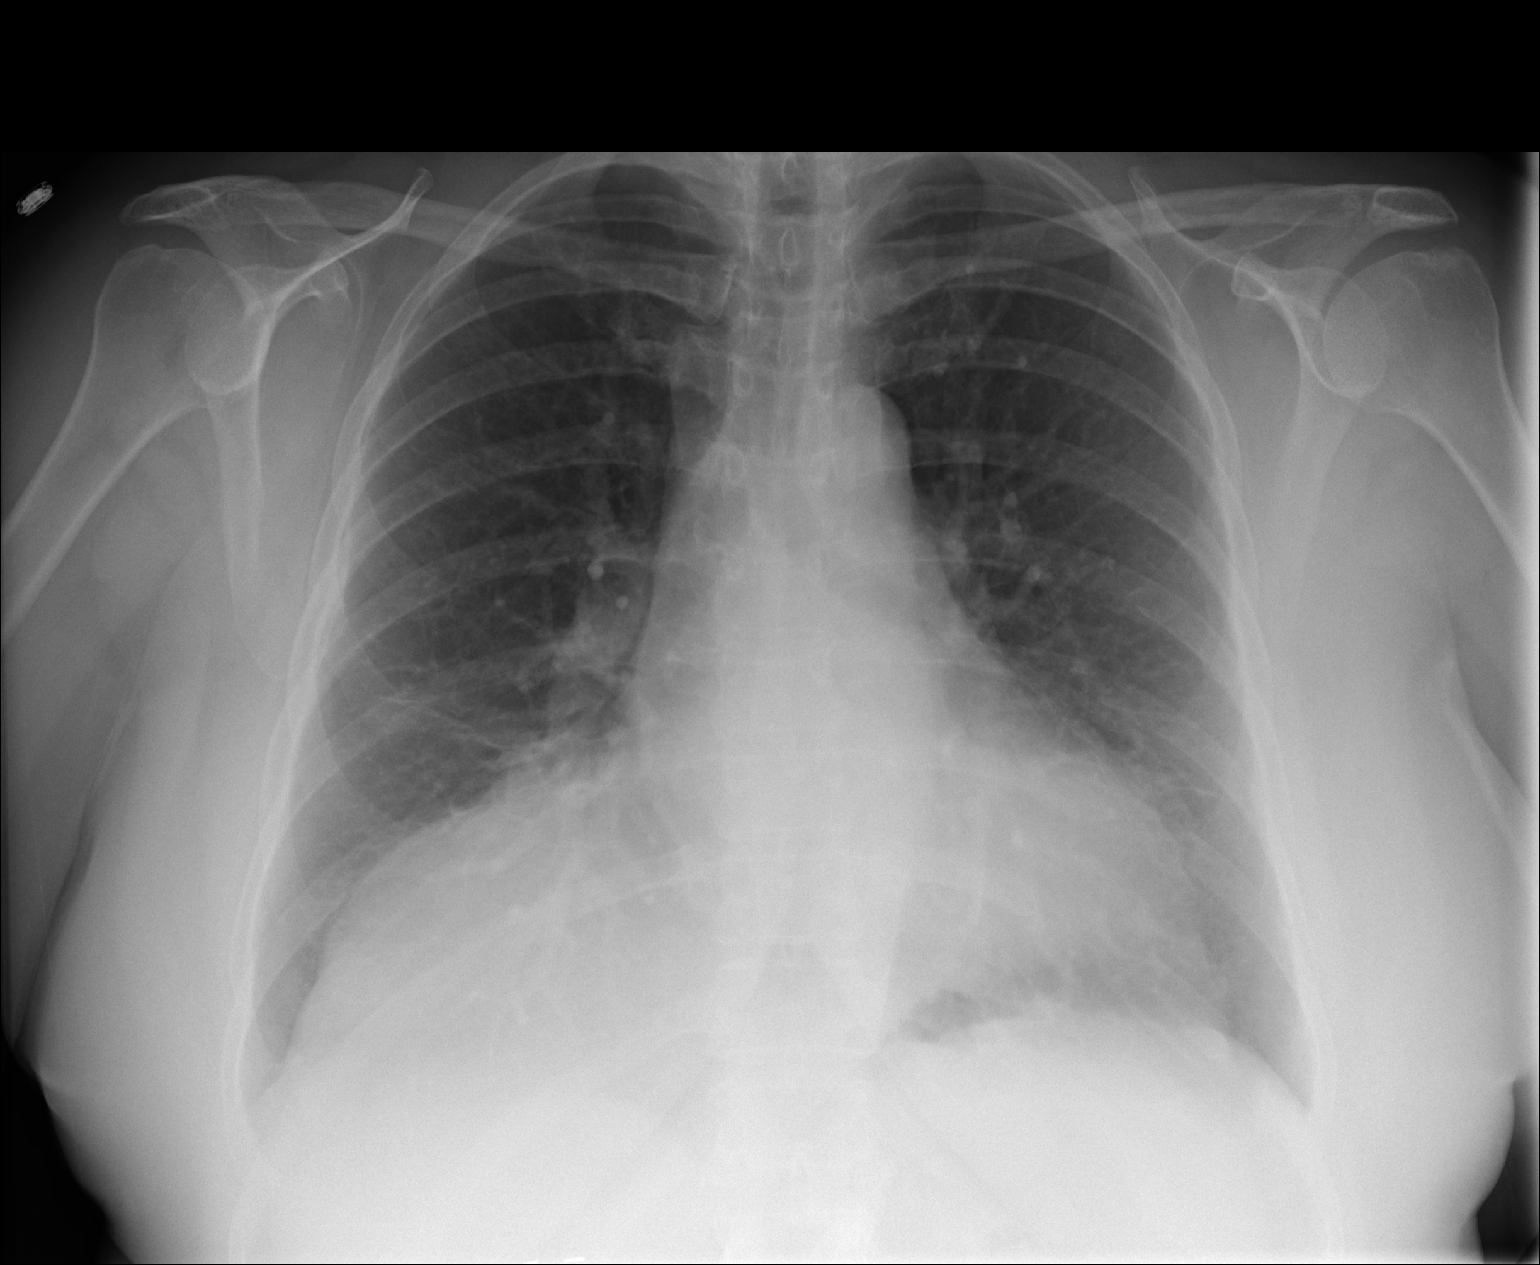
[im 2/2]
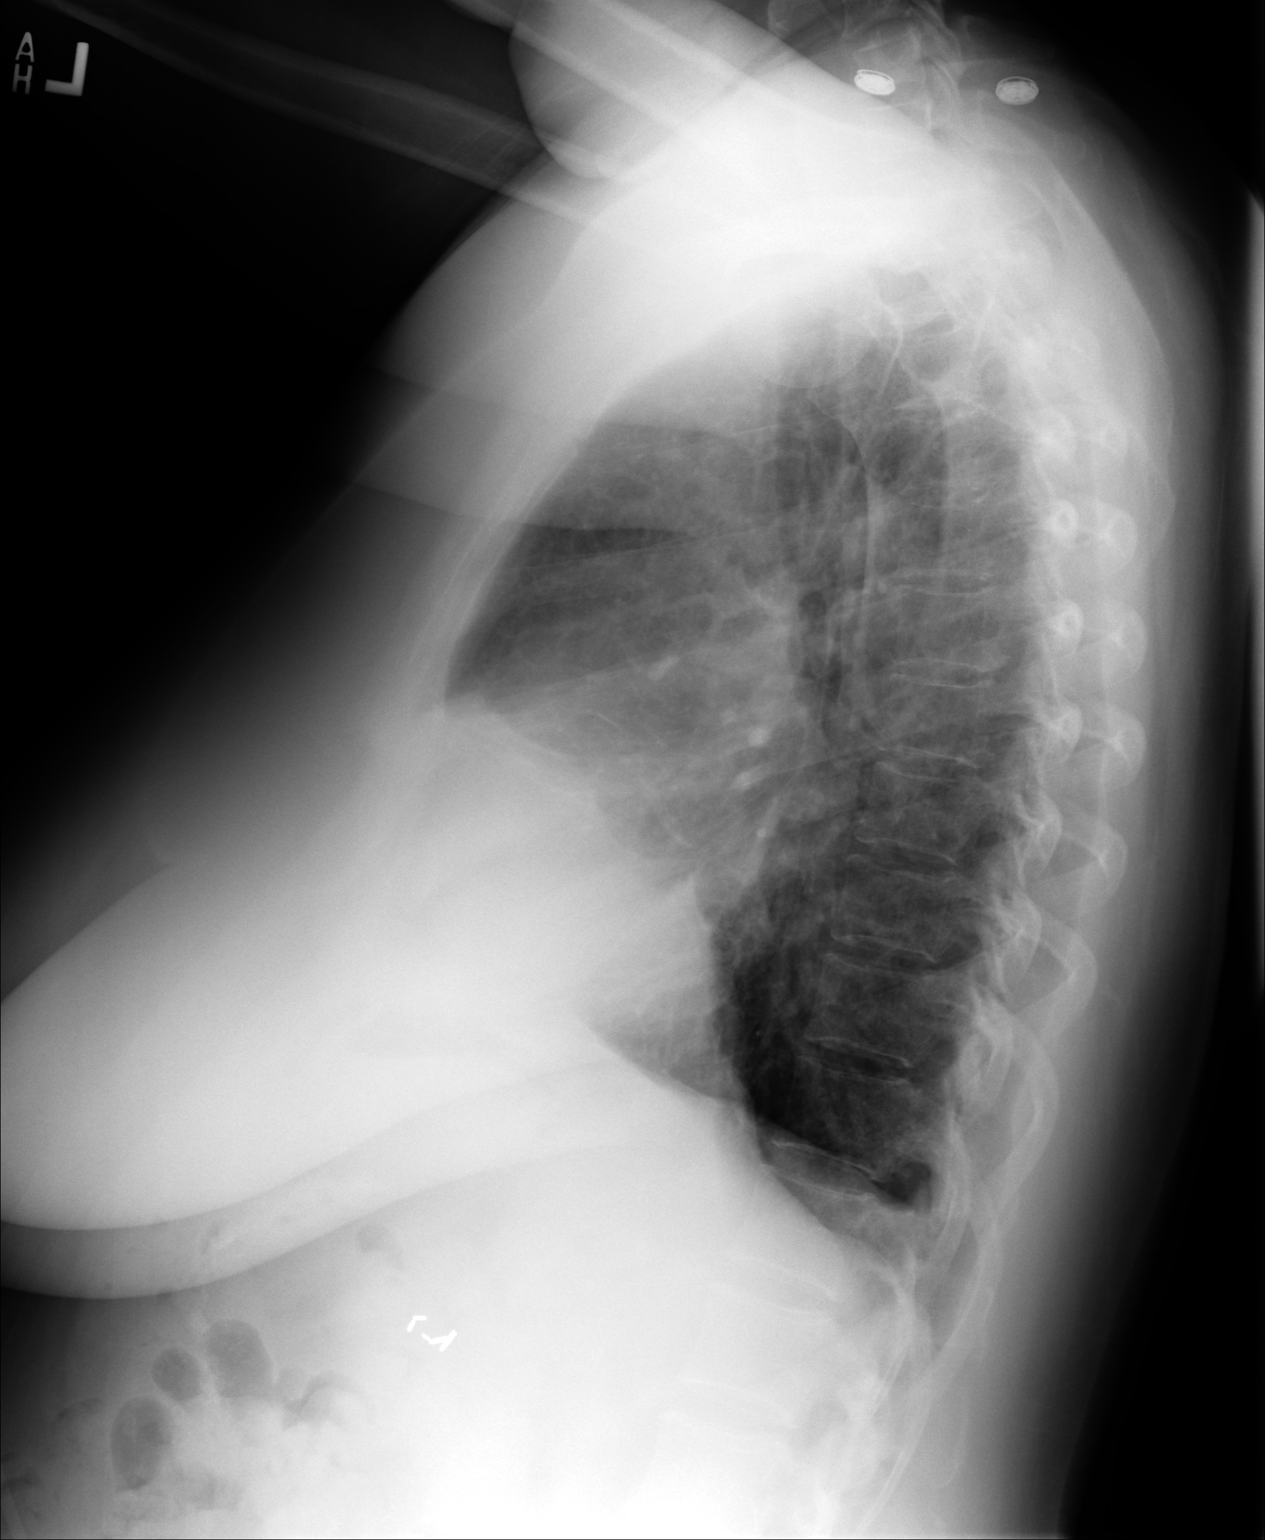

[2 of 2 positions shown; findings below may reference images not displayed]

PROCEDURE:     DXR - DXR CHEST PA (OR AP) AND LATERAL  - [DATE]  [DATE]

RESULT:     Comparison is made to a study [DATE].

The lungs are better inflated today.  The LEFT lung has improved
considerably in appearance.  The RIGHT hemidiaphragm remains prominently
elevated anteriorly.  Minimal atelectatic change at the RIGHT lung base is
seen.  The cardiopericardial silhouette is enlarged.
IMPRESSION: There is an improved appearance of both lungs since the
prior study due chiefly to improved aeration and decrease in atelectasis.
An additional followup chest x-ray is recommended.

## 2006-01-10 ENCOUNTER — Ambulatory Visit: Payer: Self-pay | Admitting: Specialist

## 2006-01-11 ENCOUNTER — Ambulatory Visit: Payer: Self-pay | Admitting: Specialist

## 2006-01-18 ENCOUNTER — Ambulatory Visit: Payer: Self-pay | Admitting: Specialist

## 2006-01-18 IMAGING — CT CT CHEST W/ CM
2 series · 16 of 31 positions shown, 19 images · non-contrast
Comparison: none

REASON FOR EXAM: Pleuritic chest pain
COMMENTS:

[Series 4: soft tissue · axial · 0.65mm/px · z∈[-220,-55]mm · 7 of 85 slices shown]
[im 10/85  mediastinal]
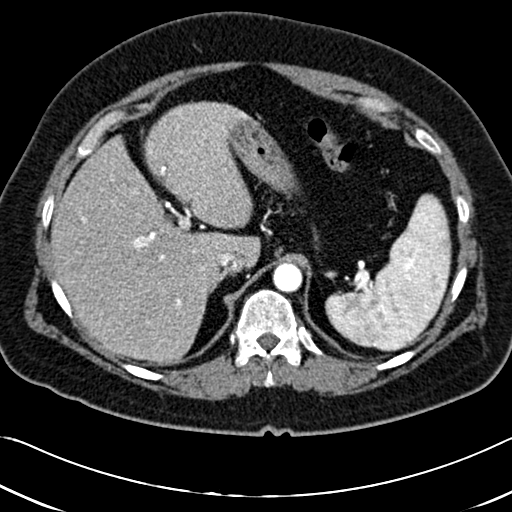
[im 20/85  mediastinal]
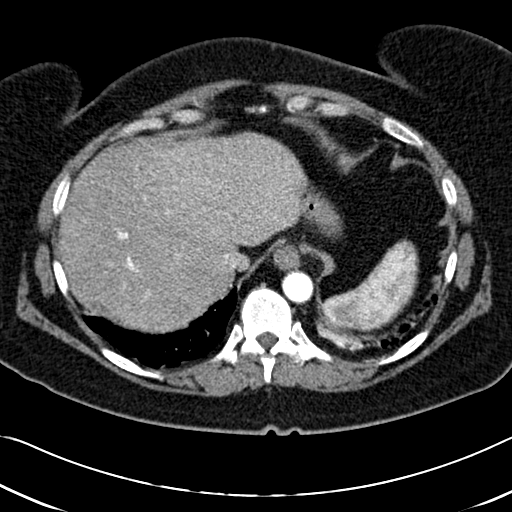
[im 29/85  mediastinal]
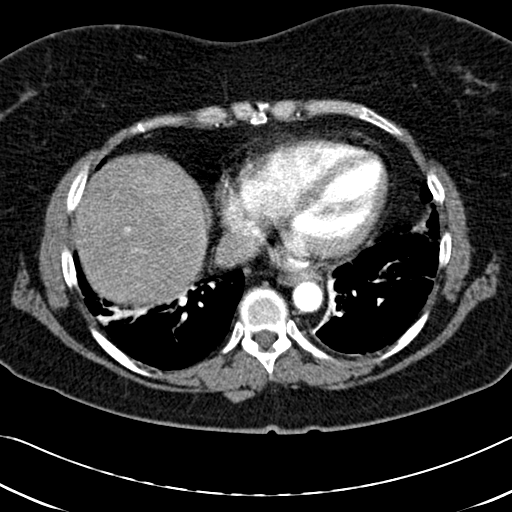
[im 35/85  mediastinal]
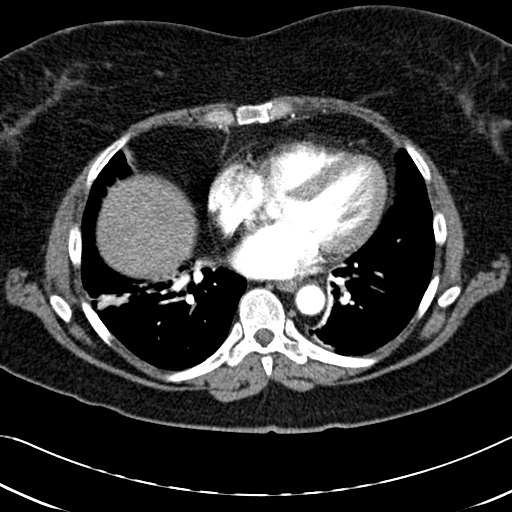
[im 50/85  mediastinal]
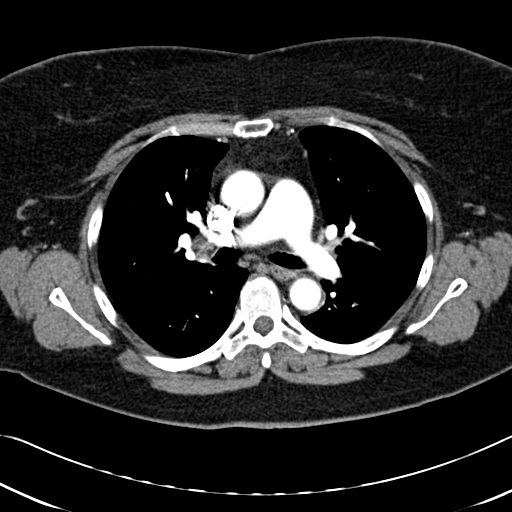
[im 57/85  mediastinal]
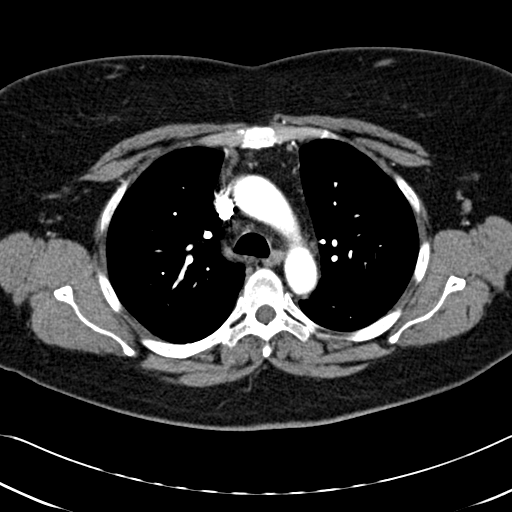
[im 65/85  mediastinal]
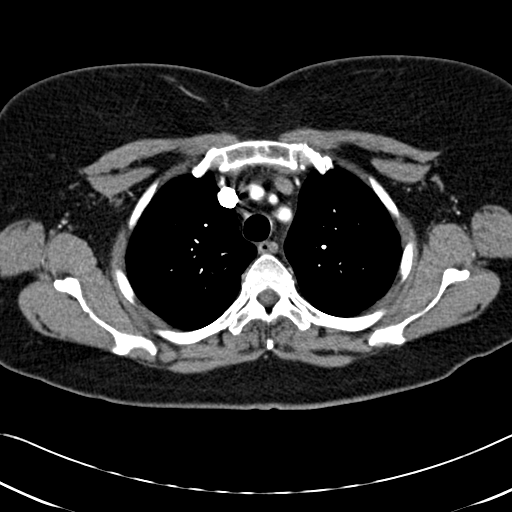

[Series 5: lung windows · axial · 0.65mm/px · z∈[-220,-25]mm · 9 of 51 slices shown, 12 images]
[im 6/51  mediastinal]
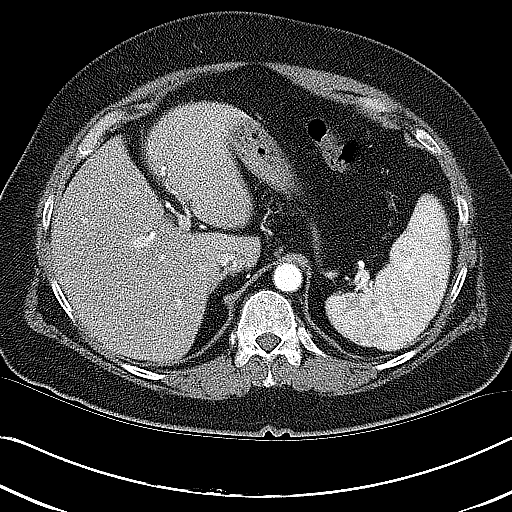
[im 6/51  lung]
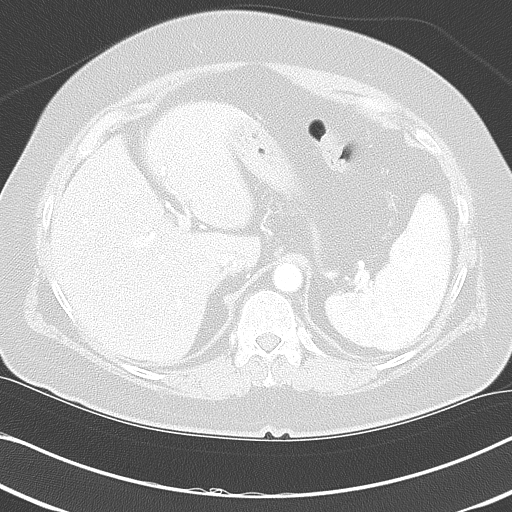
[im 12/51  lung]
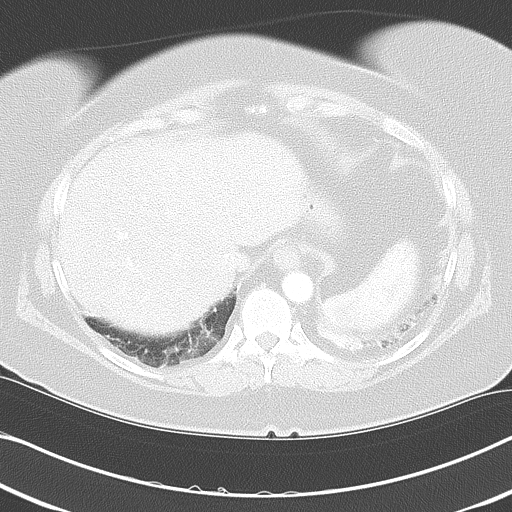
[im 17/51  lung]
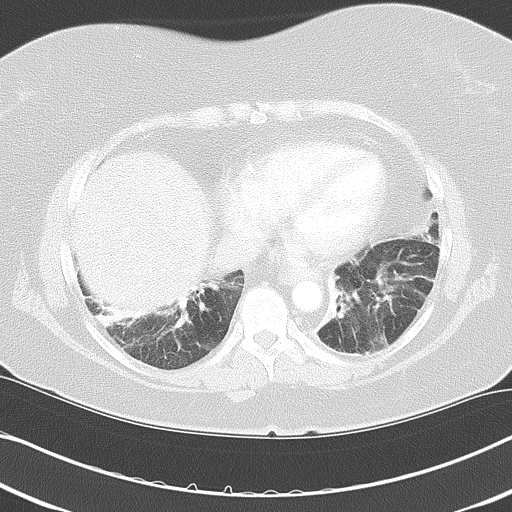
[im 23/51  lung]
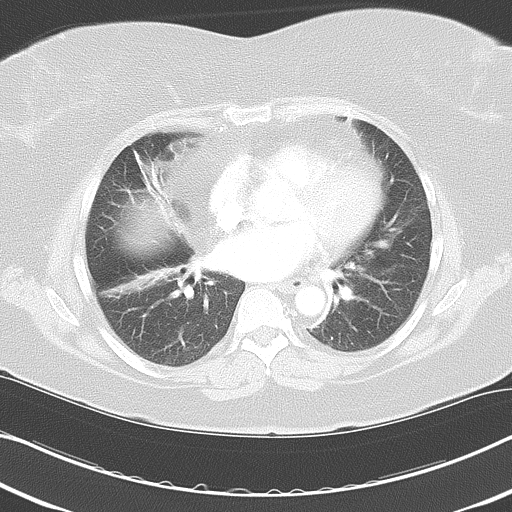
[im 24/51  mediastinal]
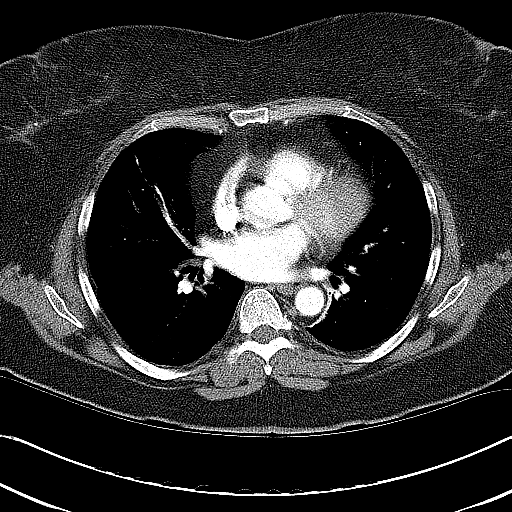
[im 24/51  lung]
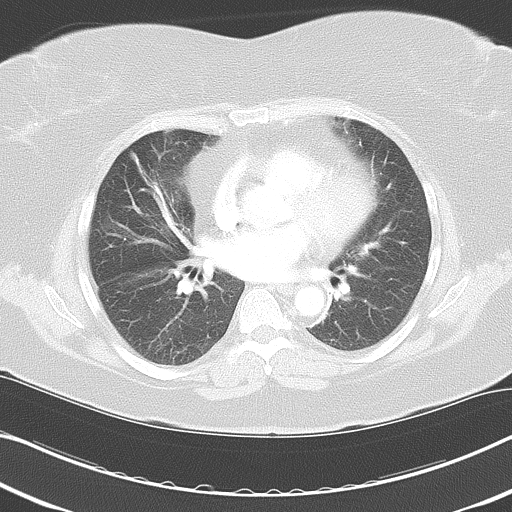
[im 28/51  lung]
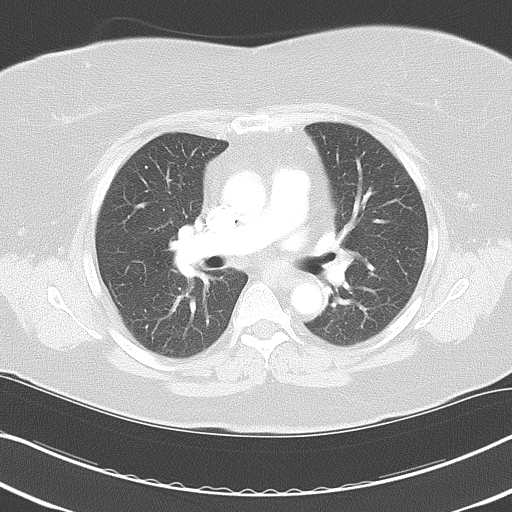
[im 34/51  lung]
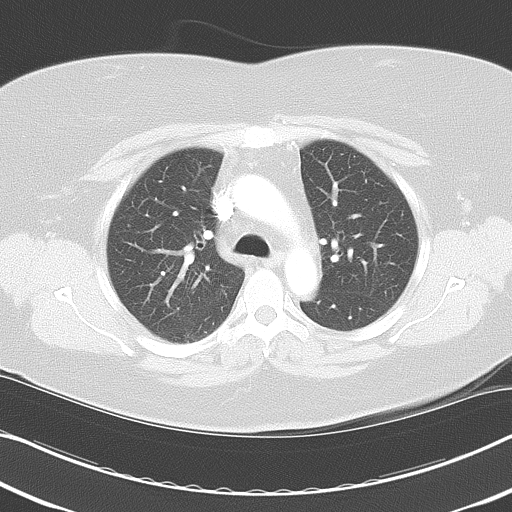
[im 39/51  lung]
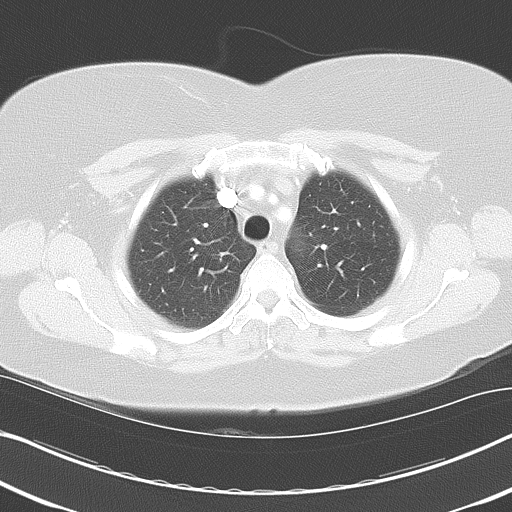
[im 45/51  mediastinal]
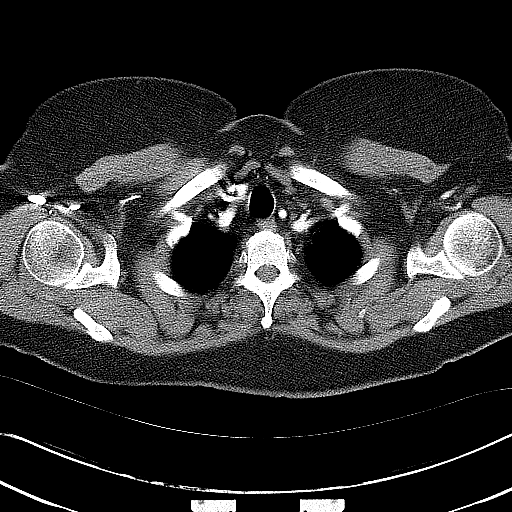
[im 45/51  lung]
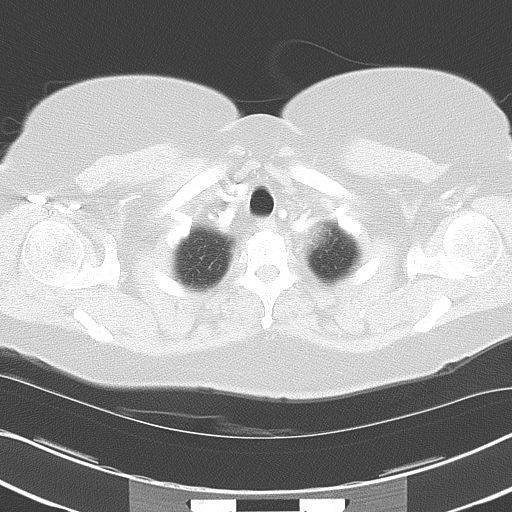

[16 of 31 positions shown; findings below may reference images not displayed]

PROCEDURE:     CT  - CT CHEST WITH CONTRAST  - [DATE]  [DATE]

RESULT:          Spiral 3 mm sections were obtained from the thoracic inlet
through the lung bases status post intravenous administration of 100 ml of
[UR].

Evaluation of the mediastinum and hilar regions and structures demonstrates
no evidence of mediastinal nor hilar adenopathy nor masses.  There is no
evidence of filling defects of any main lobar or segmental pulmonary
arteries.  Evaluation of the lung parenchyma demonstrates linear areas of
increased density within the lung bases as well as patchy areas of air space
disease.  These findings likely represent scarring and/or atelectasis,
though bibasilar infiltrates cannot be completely excluded.  The visualized
upper abdominal viscera demonstrate no gross abnormalities.
IMPRESSION: 1.     No evidence of pulmonary embolus.
2.     Atelectasis versus scarring and/or infiltrate within the lung bases,
LEFT slightly greater than RIGHT.  Clinical correlation is recommended.

## 2006-03-06 ENCOUNTER — Ambulatory Visit: Payer: Self-pay

## 2006-03-06 IMAGING — US ABDOMEN ULTRASOUND
1 series · 17 of 25 positions shown · non-contrast
Comparison: none

REASON FOR EXAM: Epigastric/abdominal pain
COMMENTS:

[Series 1: abdomen ultrasound · 17 of 47 slices shown]
[im 1/47]
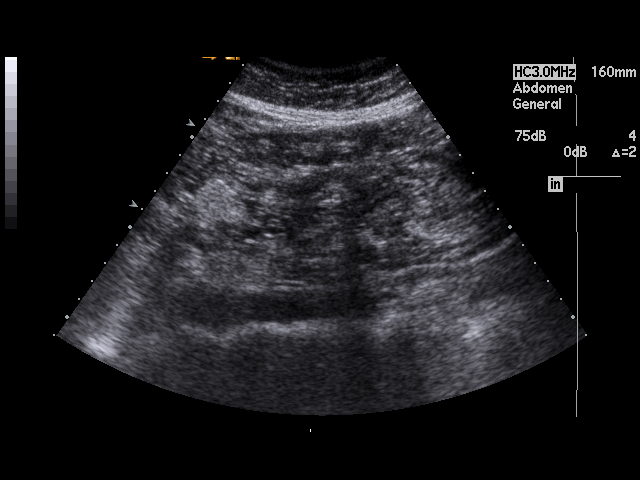
[im 4/47]
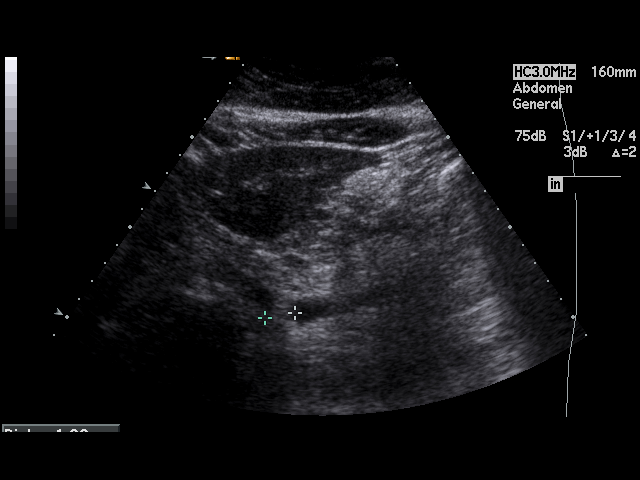
[im 6/47]
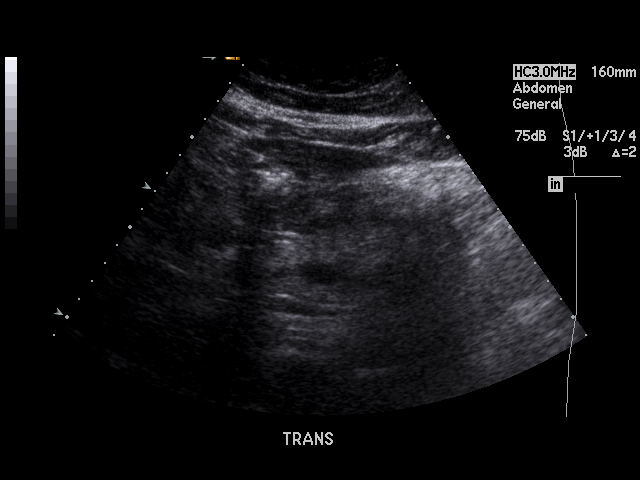
[im 10/47]
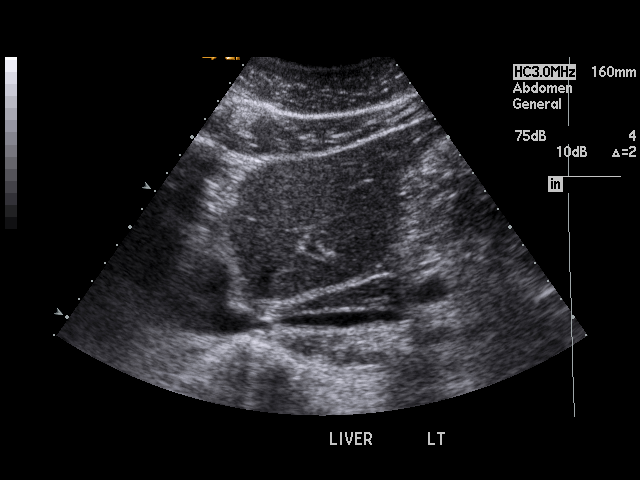
[im 12/47]
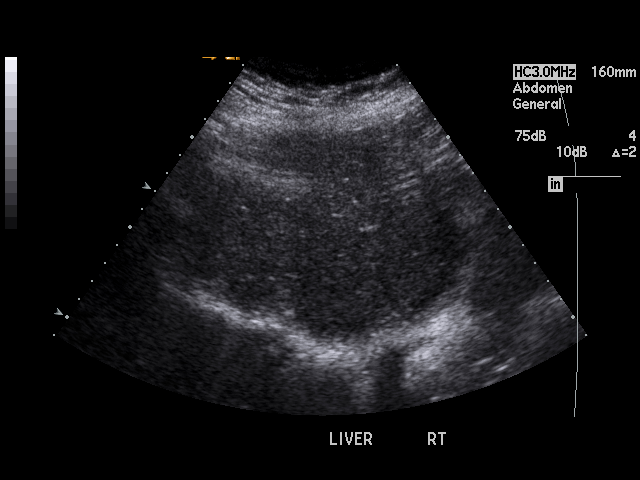
[im 16/47]
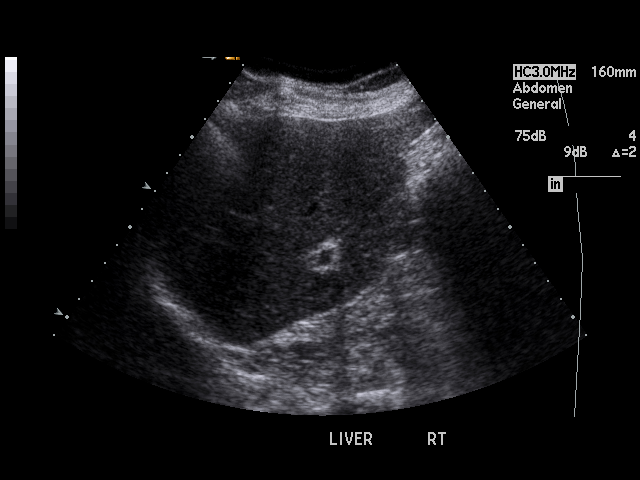
[im 18/47]
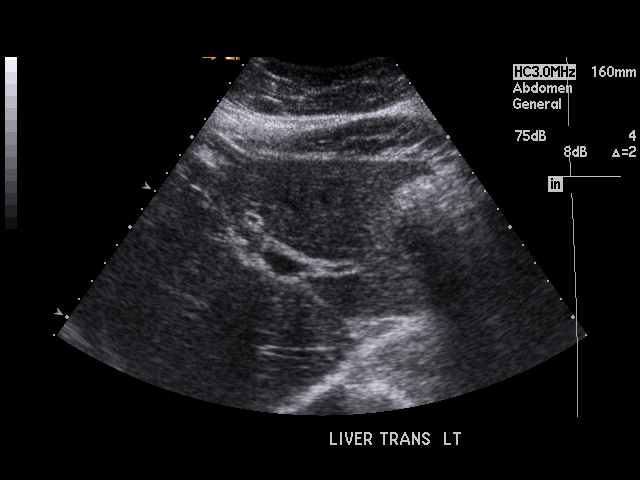
[im 22/47]
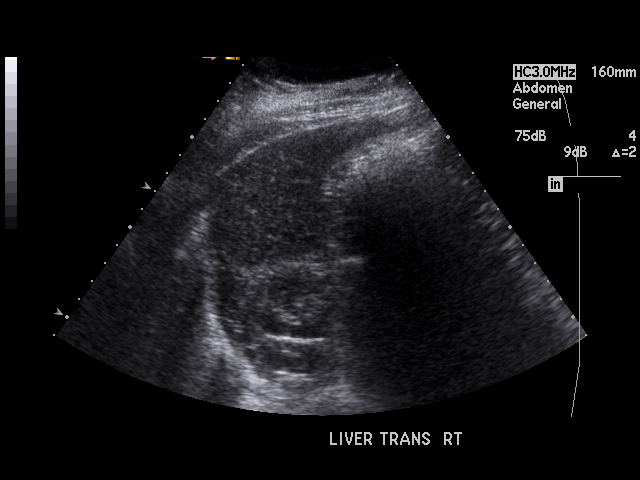
[im 24/47]
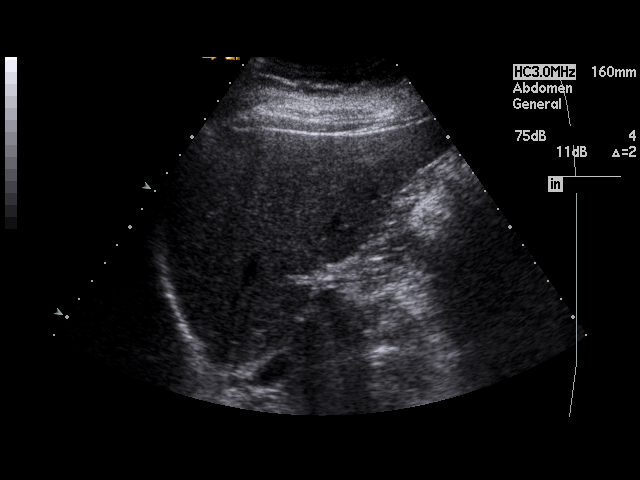
[im 25/47]
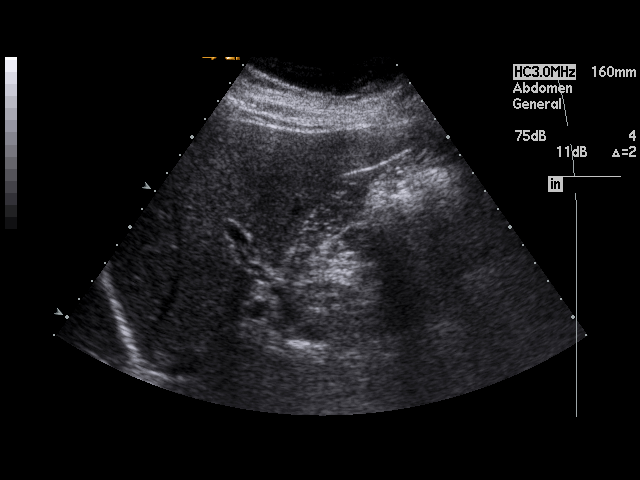
[im 29/47]
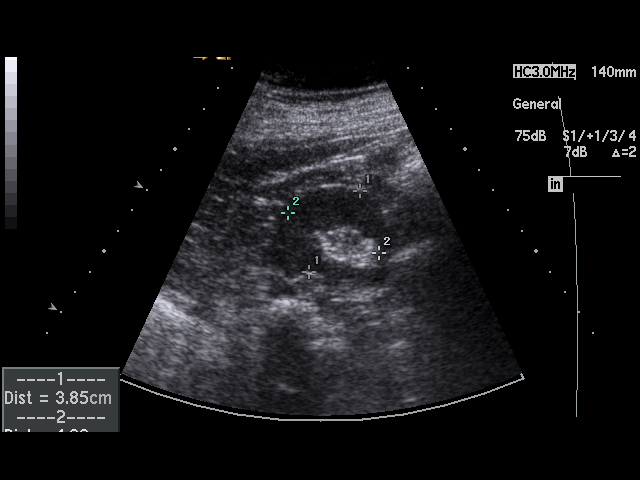
[im 31/47]
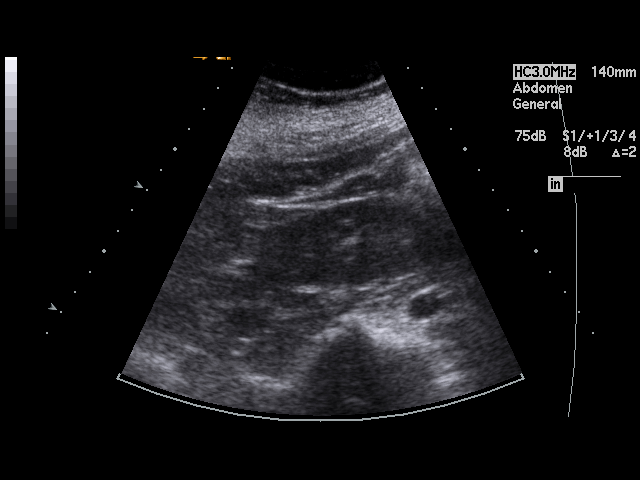
[im 35/47]
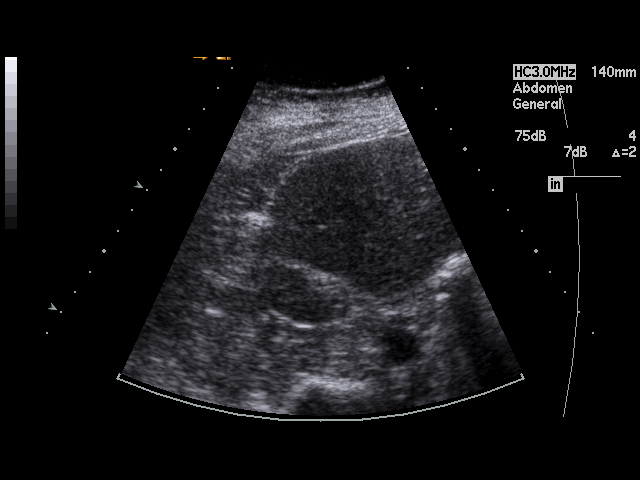
[im 37/47]
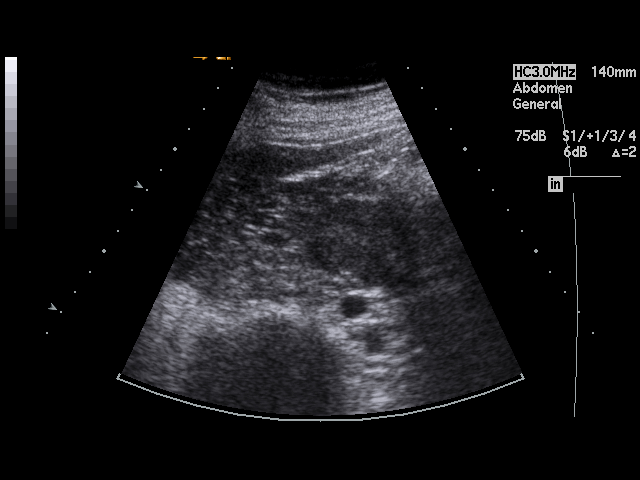
[im 41/47]
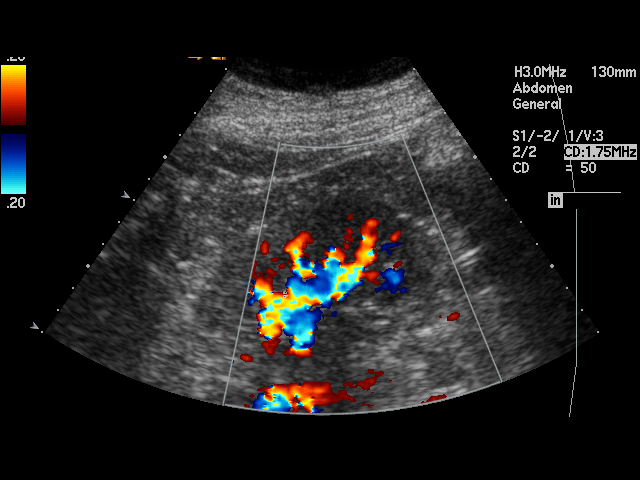
[im 43/47]
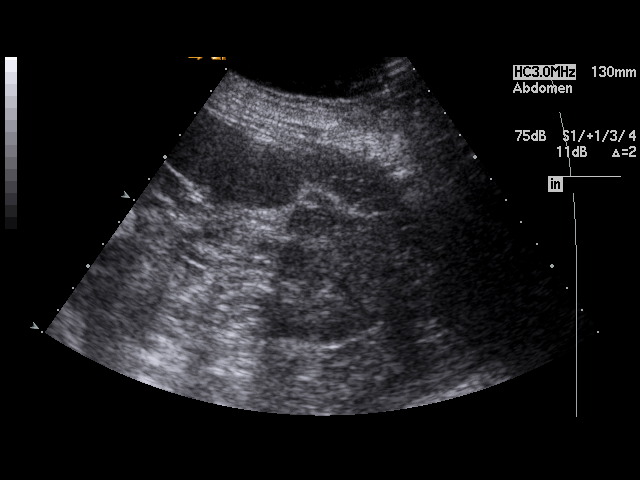
[im 47/47]
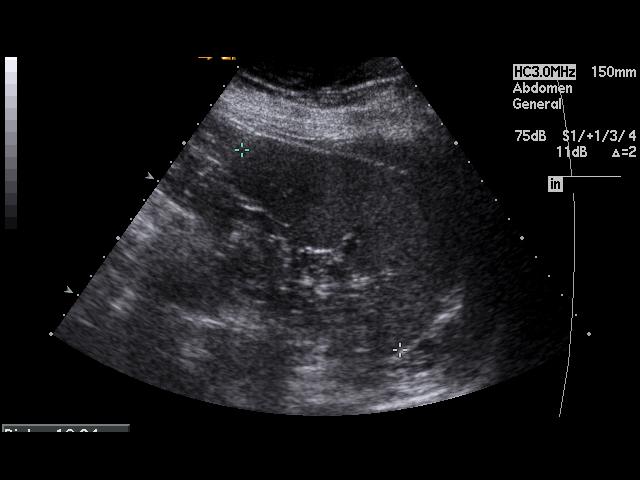

[17 of 25 positions shown; findings below may reference images not displayed]

PROCEDURE:     US  - US ABDOMEN GENERAL SURVEY  - [DATE] [DATE]

RESULT:     The liver, spleen and pancreas are normal in appearance. The
abdominal aorta is visualized and is normal in appearance. The gallbladder
is not seen compatible with prior cholecystectomy. The common bile duct
measures 5.5 mm in diameter which is within normal limits. The kidneys show
no hydronephrosis. There is no ascites.
IMPRESSION: 1.     No significant abnormalities are noted.
2.     The patient is status post cholecystectomy.

## 2006-03-23 ENCOUNTER — Ambulatory Visit: Payer: Self-pay | Admitting: Unknown Physician Specialty

## 2006-03-23 IMAGING — CT CT ABD-PELV W/ CM
1 of 3 series · 13 of 32 positions shown, 19 images · non-contrast
Comparison: none

REASON FOR EXAM: Abdominal pain
COMMENTS:

PROCEDURE:     CT  - CT ABDOMEN / PELVIS  W  - [DATE]  [DATE]
RESULT:          Reason for consultation is abdominal pain and periumbilical
pain.
TECHNIQUE: Axial images were obtained from the hemidiaphragm to the
pubic symphysis post intravenous and oral administration of contrast
material.

[Series 2: soft tissue · axial · 0.69mm/px · z∈[-275,+101]mm · 13 of 55 slices shown, 19 images]
[im 4/55  soft-tissue]
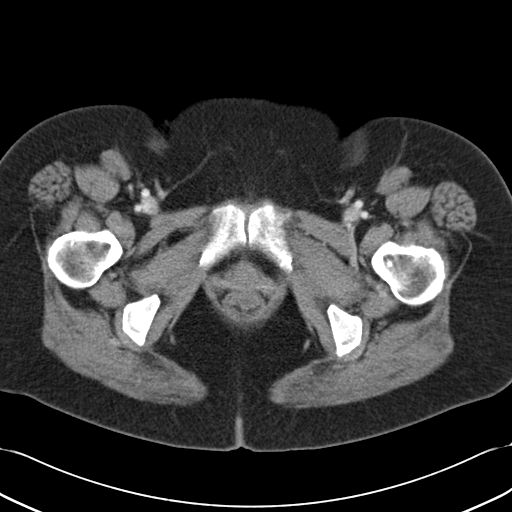
[im 4/55  bone]
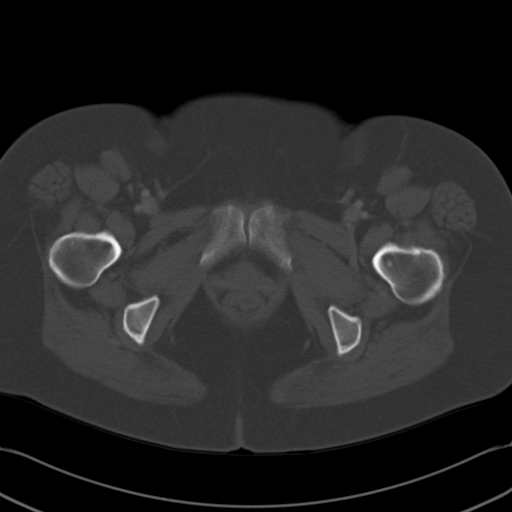
[im 8/55  soft-tissue]
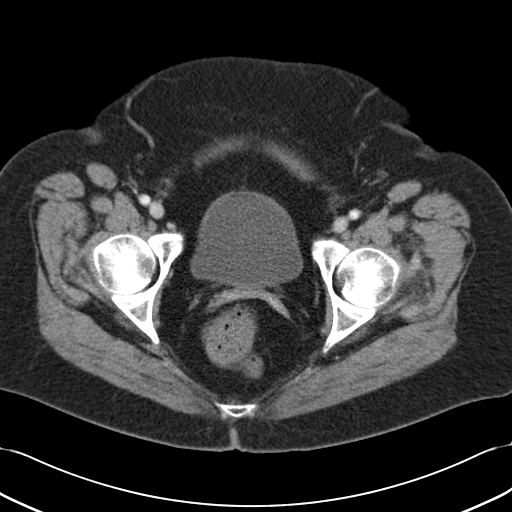
[im 11/55  soft-tissue]
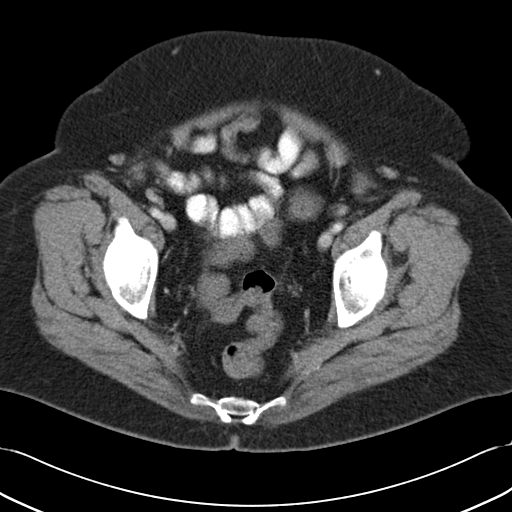
[im 15/55  soft-tissue]
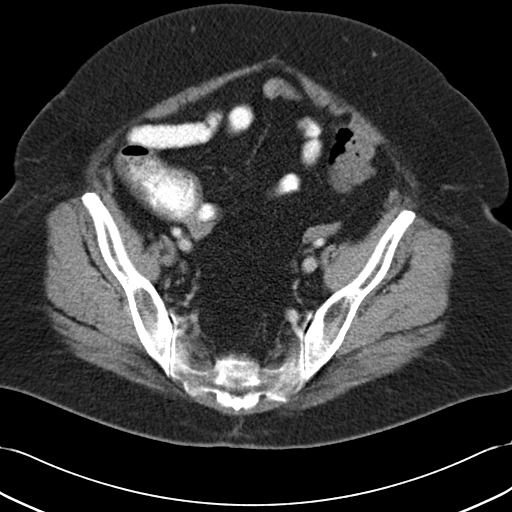
[im 19/55  soft-tissue]
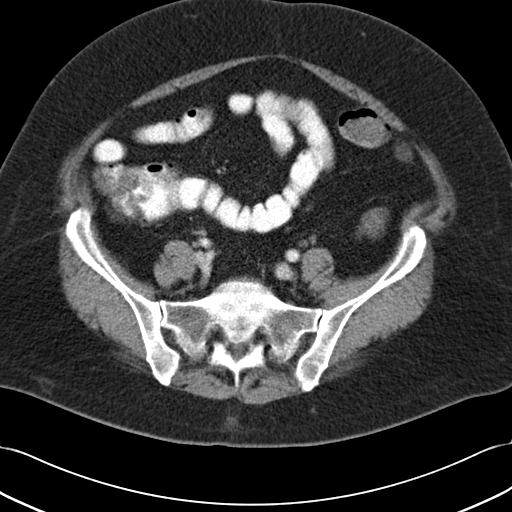
[im 22/55  soft-tissue]
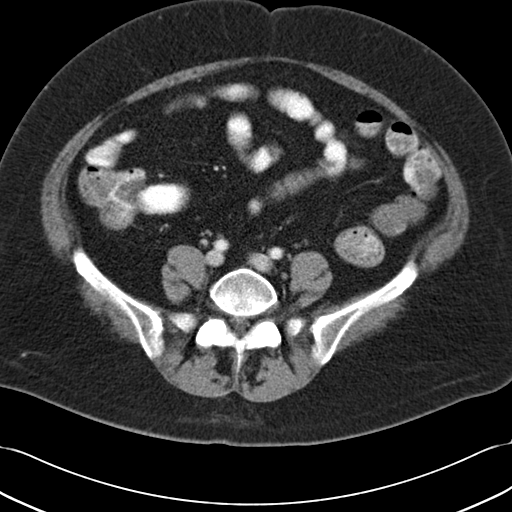
[im 29/55  soft-tissue]
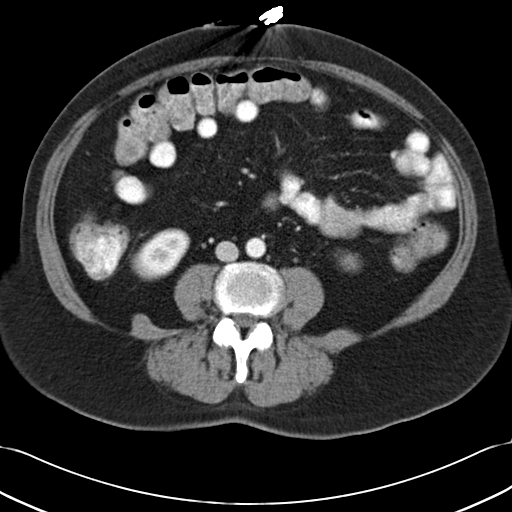
[im 33/55  soft-tissue]
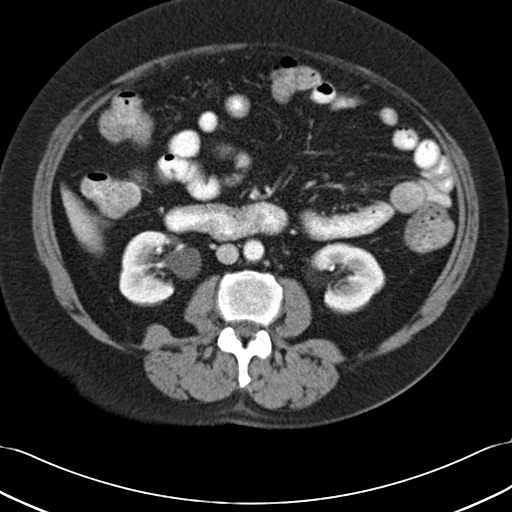
[im 37/55  soft-tissue]
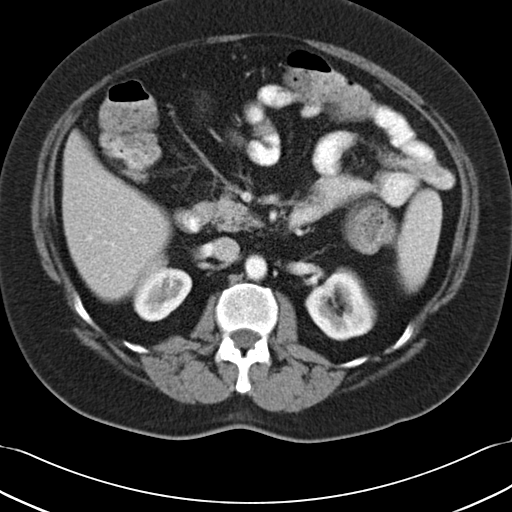
[im 37/55  bone]
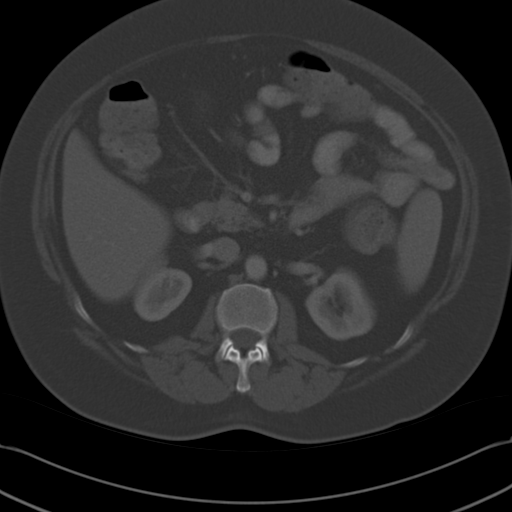
[im 40/55  soft-tissue]
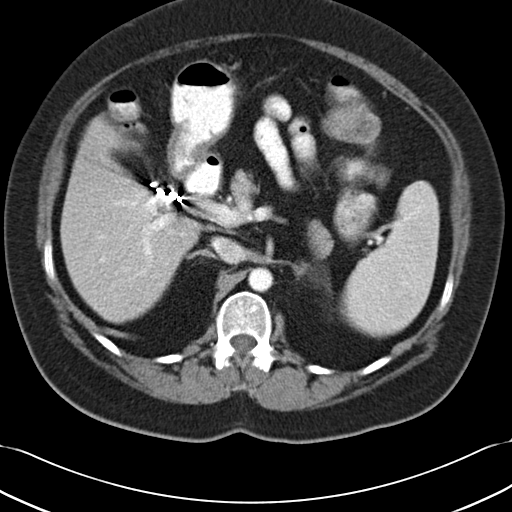
[im 40/55  lung]
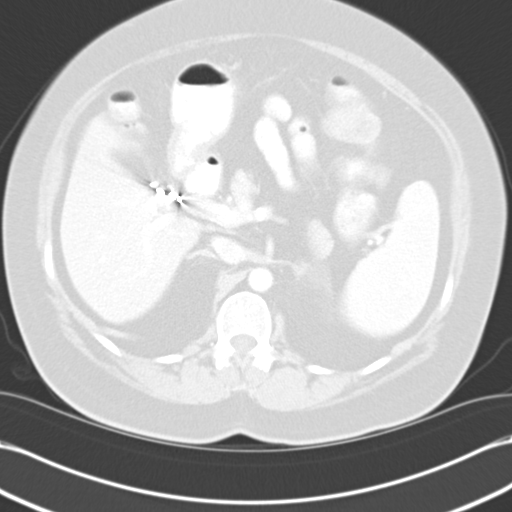
[im 44/55  soft-tissue]
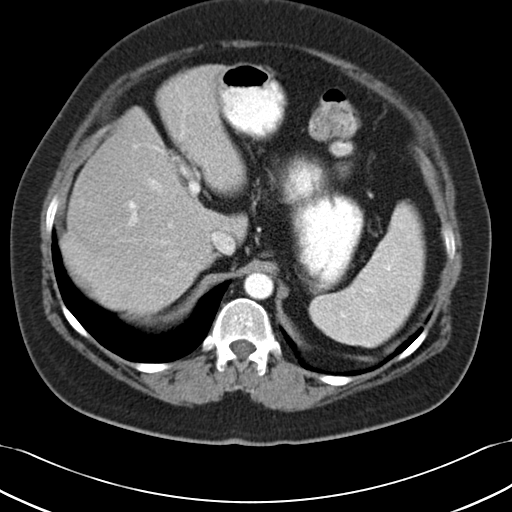
[im 44/55  lung]
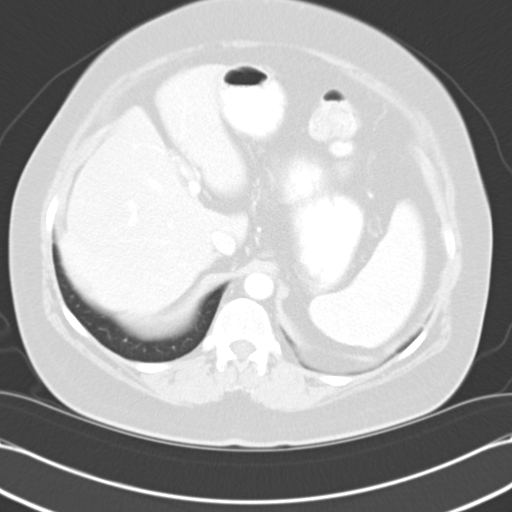
[im 47/55  soft-tissue]
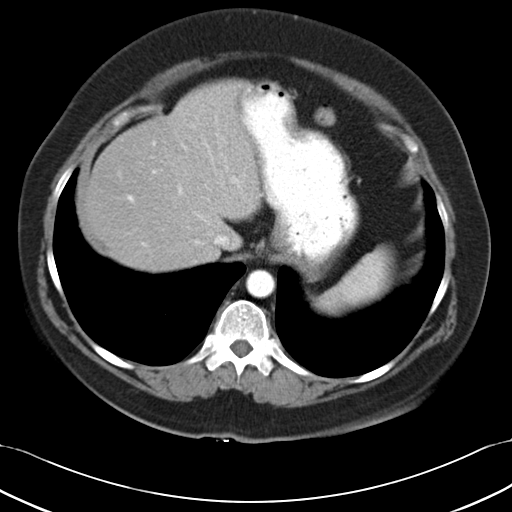
[im 47/55  lung]
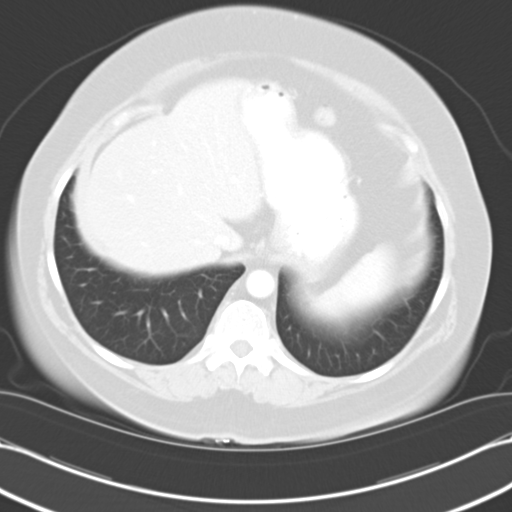
[im 51/55  soft-tissue]
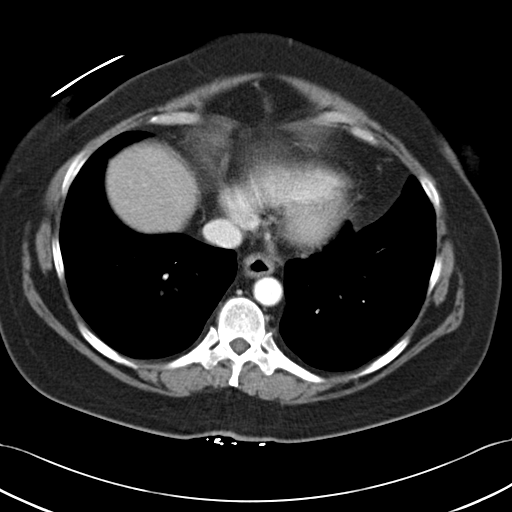
[im 51/55  lung]
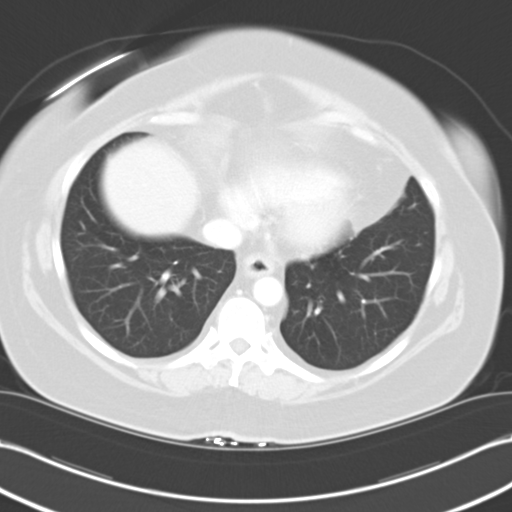

[13 of 32 positions shown; findings below may reference images not displayed]

Both the liver and spleen appear intact.  No focal lesions are identified.
Multiple surgical clips are noted in the gallbladder fossa.  The pancreas is
visualized with no focal masses seen.

Both kidneys excrete the contrast material.  There is no hydronephrosis.  An
extrarenal pelvis is noted bilaterally.  No renal lesions are noted.  No
evidence of diverticulitis is seen.

The images through the lung bases reveal the lung bases to be clear with no
effusions.
IMPRESSION: 1.     The patient is status post cholecystectomy.
2.     No significant abnormality is identified on the abdominopelvic CT.

## 2006-10-18 ENCOUNTER — Ambulatory Visit: Payer: Self-pay | Admitting: Internal Medicine

## 2007-04-17 ENCOUNTER — Ambulatory Visit: Payer: Self-pay | Admitting: Unknown Physician Specialty

## 2007-04-17 IMAGING — RF DG UGI W/ SMALL BOWEL
2 of 3 series · 7 of 24 positions shown · non-contrast
Comparison: none

REASON FOR EXAM: Epigastric abdominal pain
COMMENTS:

[Series 1: view not recorded · 0.17mm/px · 4 of 4 slices shown]
[im 1/4]
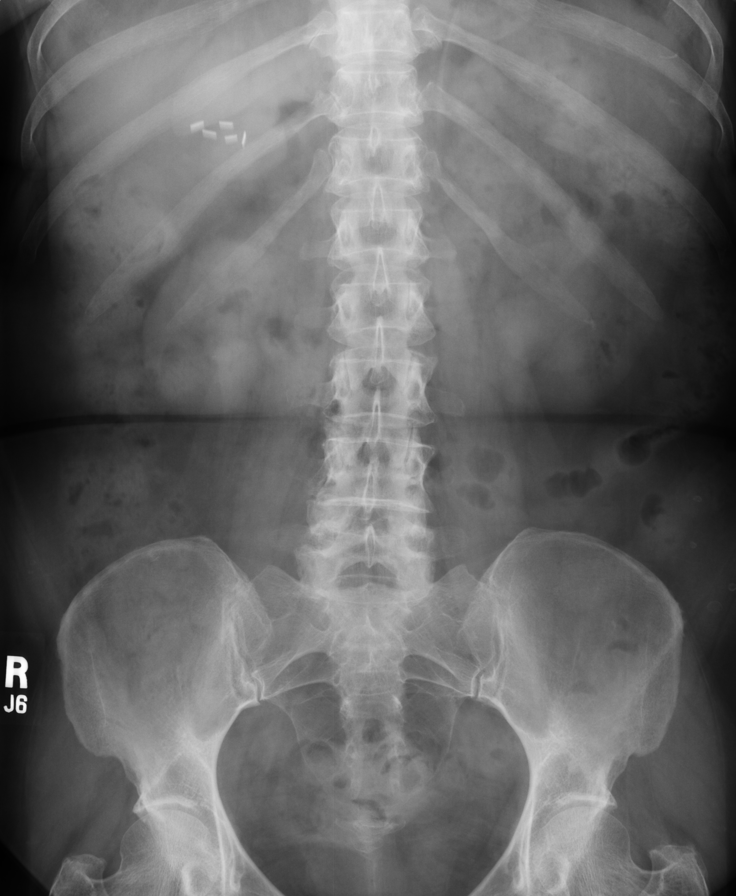
[im 2/4]
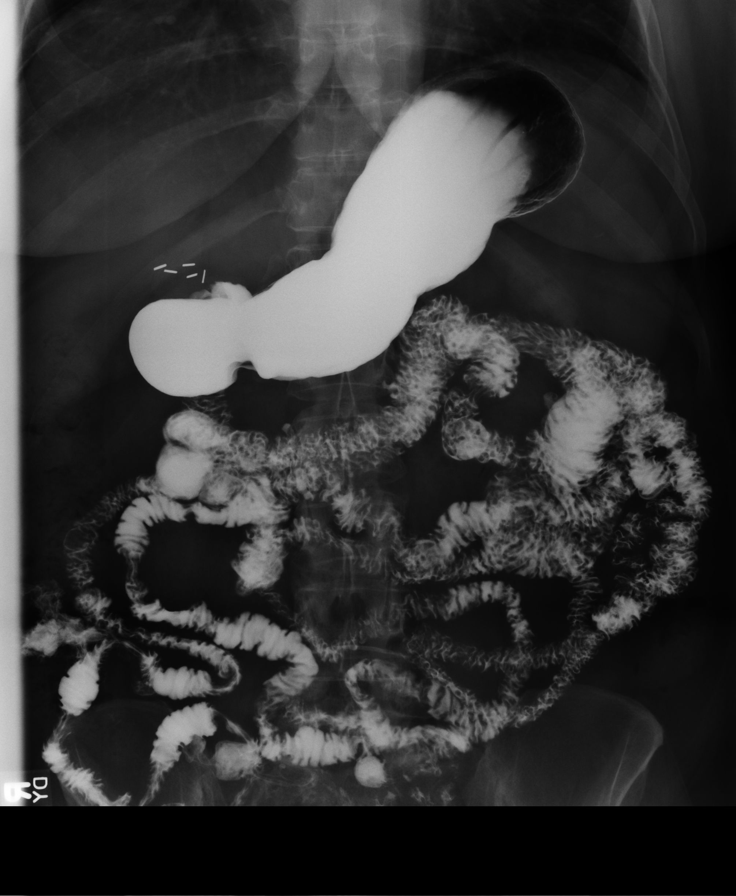
[im 3/4]
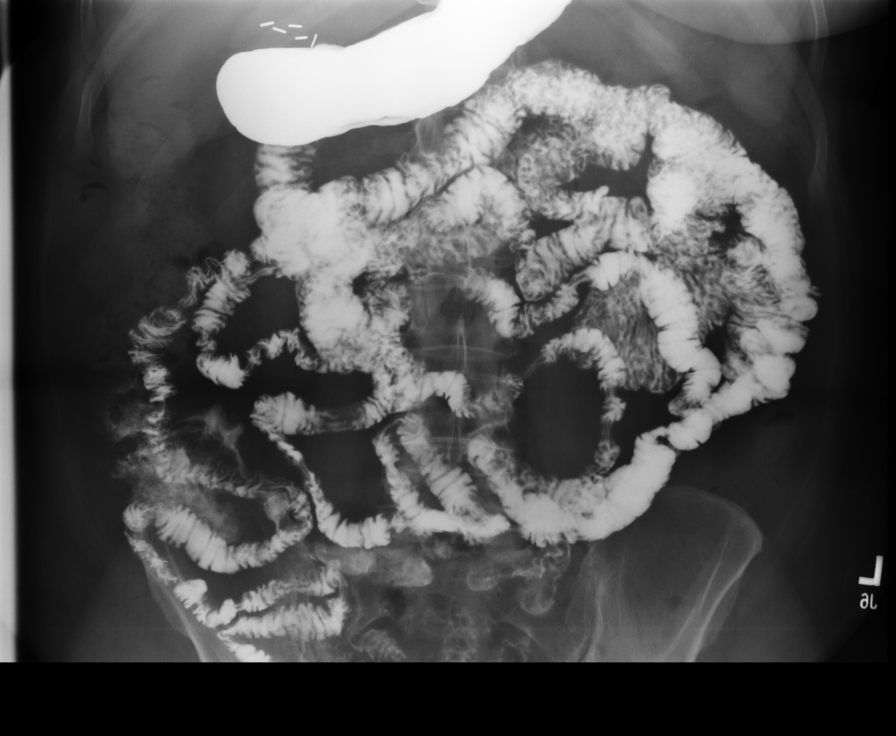
[im 4/4]
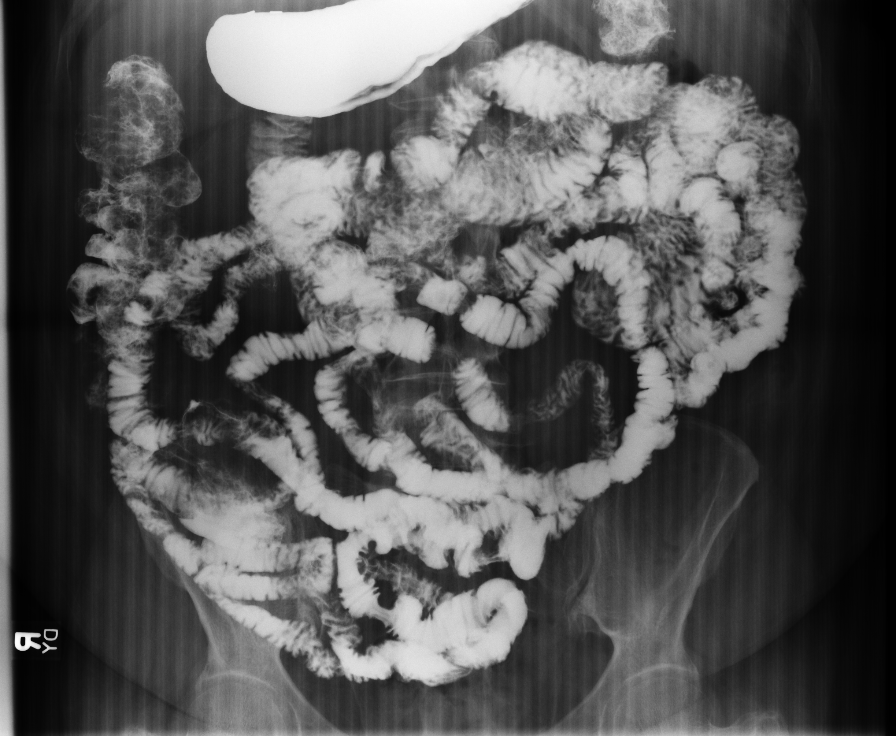

[Series 1: run · 3 of 19 slices shown]
[im 1/19]
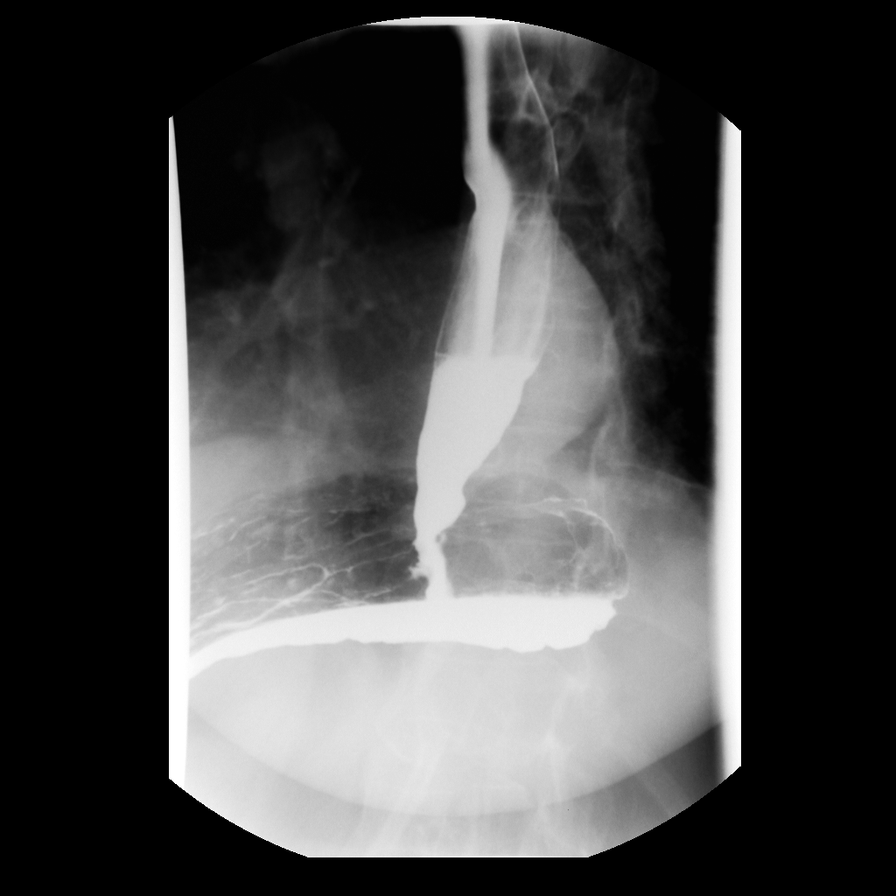
[im 2/19]
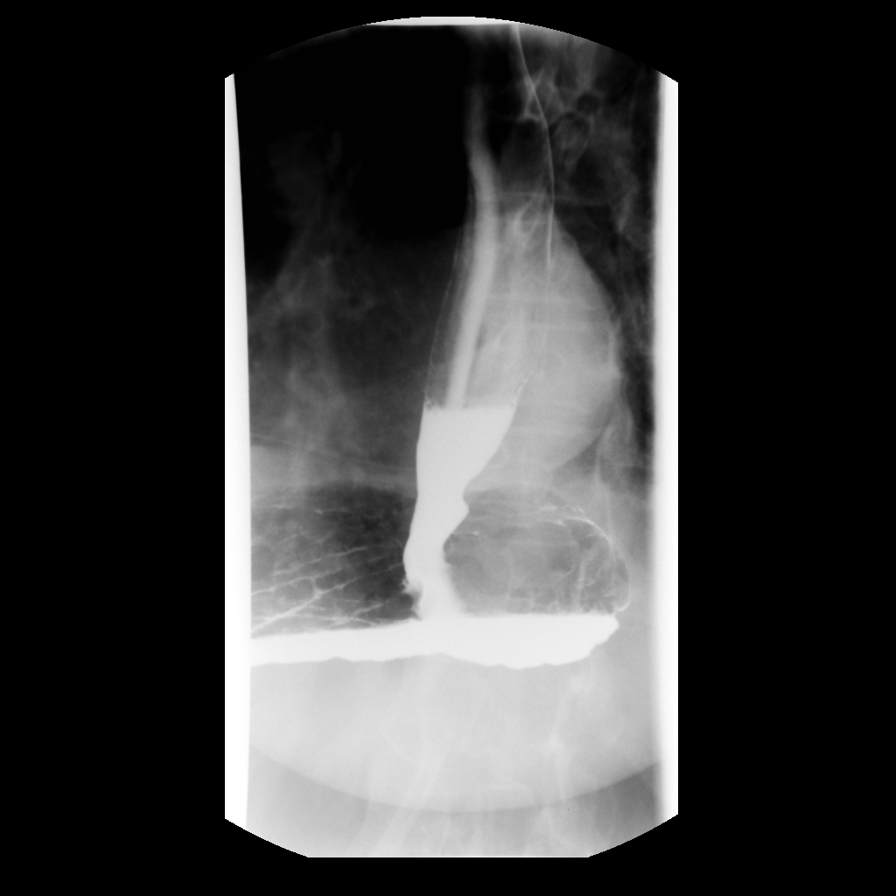
[im 3/19]
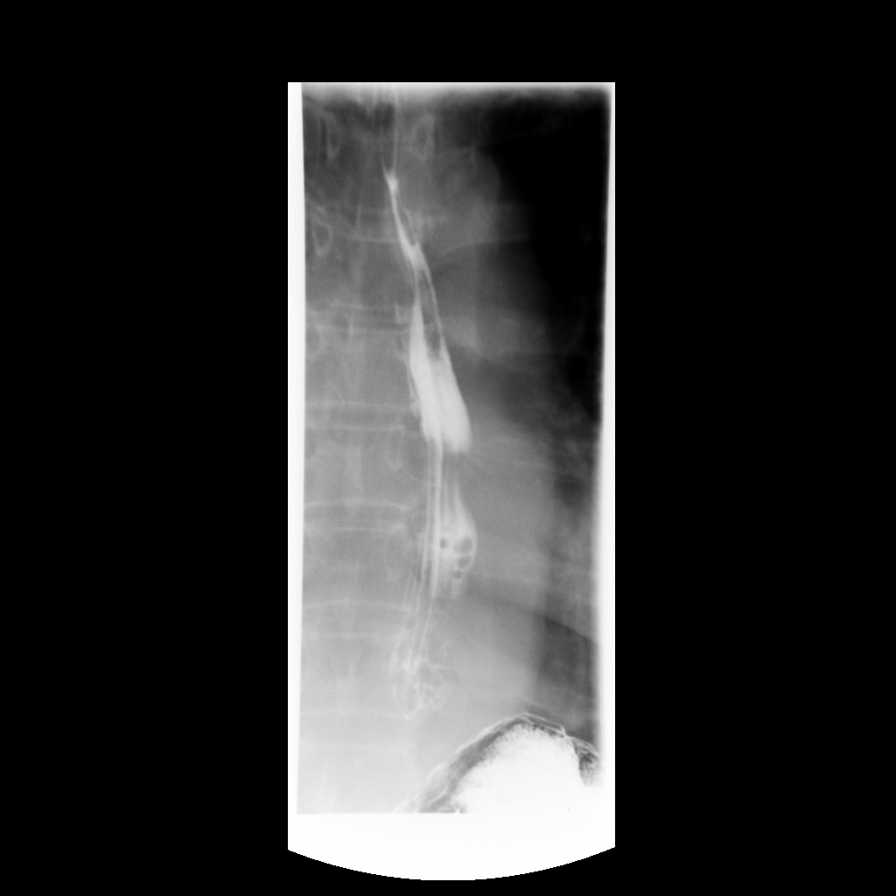

[7 of 24 positions shown; findings below may reference images not displayed]

PROCEDURE:     FL  - FL UPPER GI WITH SMALL BOWEL  - [DATE]  [DATE]

RESULT:     The anticipated procedure was discussed with Ms. AGOSTO. She
voiced her willingness to proceed. The patient ingested barium without
difficulty. The thoracic esophagus distended well. There was a small,
reducible hiatal hernia present. No more than minimal reflux was elicited.
There was no evidence of esophagitis. A 12.0 mm barium pill passed without
difficulty.

The stomach distended well. The mucosal fold pattern was normal. The gastric
emptying appeared normal. The duodenal bulb and C-sweep were normal in
appearance.

The jejunum demonstrated a normal feathery mucosal pattern. There was no
evidence of stricture or abnormally dilated bowel loops. The ileum also
demonstrated a normal mucosal pattern. The terminal ileum demonstrated
normal peristalsis. No tenderness was elicited to palpation over the
abdomen. The barium reached the large bowel by approximately 30 minutes.
IMPRESSION: 1.  There was a small, reducible hiatal hernia with no evidence of
esophagitis and only a small amount of reflux was elicited.
2.  The stomach and duodenum were normal in appearance.
3.  The jejunum and ileum were also normal in appearance.

## 2007-10-04 ENCOUNTER — Other Ambulatory Visit: Payer: Self-pay

## 2007-10-04 ENCOUNTER — Emergency Department: Payer: Self-pay | Admitting: Emergency Medicine

## 2007-10-04 IMAGING — CR DG CHEST 2V
1 series · 2 of 2 positions shown · non-contrast
Comparison: none

REASON FOR EXAM: chest tightness
COMMENTS:

[Series 1: view not recorded · 0.17mm/px · 2 of 2 slices shown]
[im 1/2]
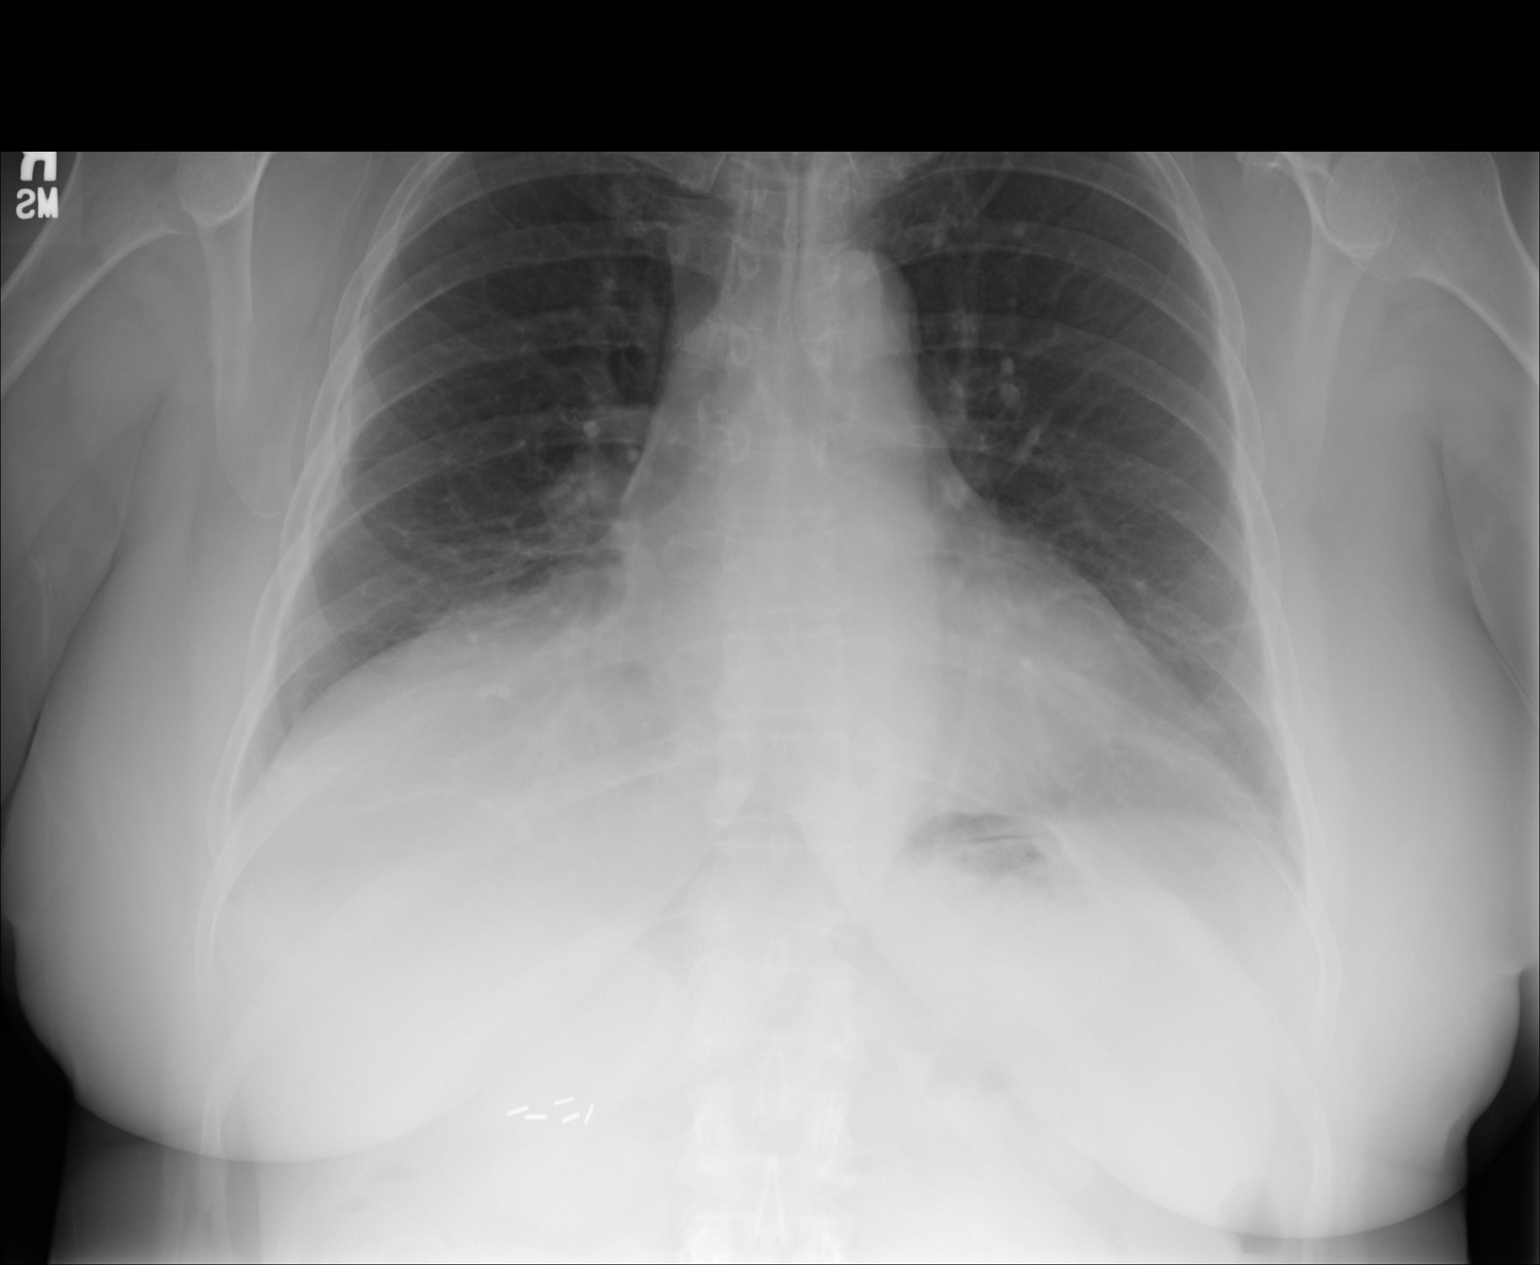
[im 2/2]
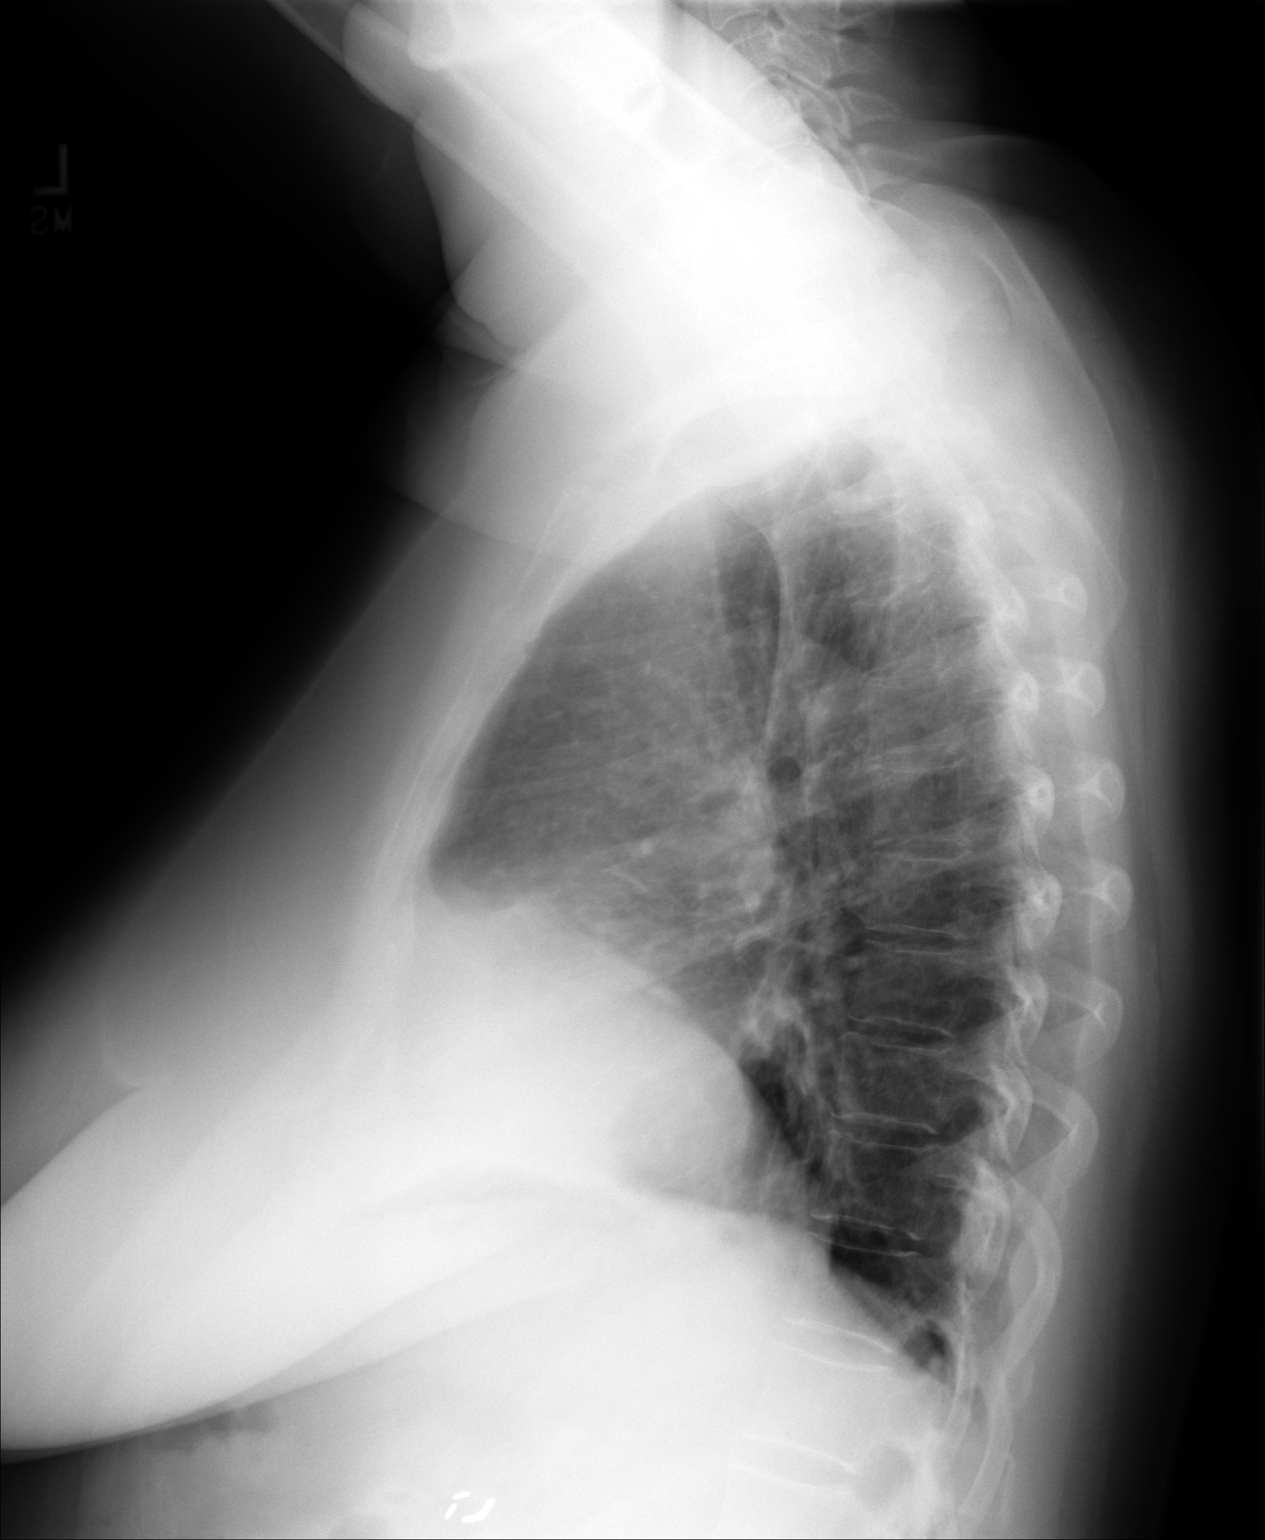

[2 of 2 positions shown; findings below may reference images not displayed]

PROCEDURE:     DXR - DXR CHEST PA (OR AP) AND LATERAL  - [DATE] [DATE]

RESULT:     Two images of the chest are performed. The patient has no prior
examination for comparison. There is mild elevation of the RIGHT
hemidiaphragm anteriorly. There appears to be some RIGHT basilar atelectasis
or minimal infiltrate present. The lungs are otherwise clear. There is no
effusion, pneumothorax or pulmonary edema present. No underlying pulmonary
nodule is evident.
IMPRESSION: 1.     Areas of basilar atelectasis or fibrosis most pronounced in the
anterior aspect at the RIGHT lung base suggestive of RIGHT middle lobe
atelectasis or minimal infiltrate.

## 2007-10-05 IMAGING — CT CT CHEST W/ CM
1 series · 16 of 33 positions shown, 20 images · non-contrast
Comparison: none

REASON FOR EXAM: rm 3   chest pain
COMMENTS:

PROCEDURE:     CT  - CT CHEST (FOR PE) W  - [DATE]  [DATE]
RESULT:
HISTORY: Chest pain.

[Series 4: soft tissue · axial · 0.71mm/px · z∈[-358,-82]mm · 16 of 100 slices shown, 20 images]
[im 4/100  mediastinal]
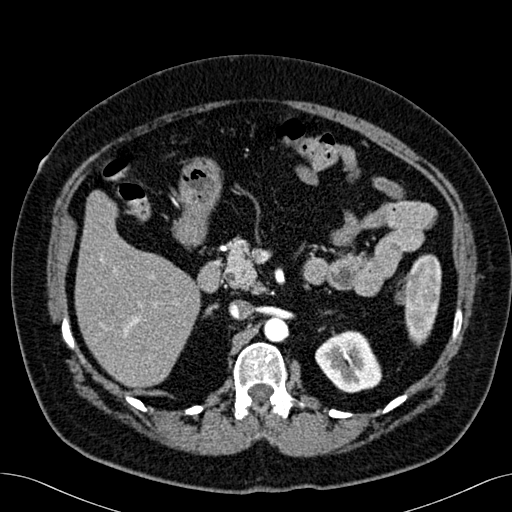
[im 4/100  lung]
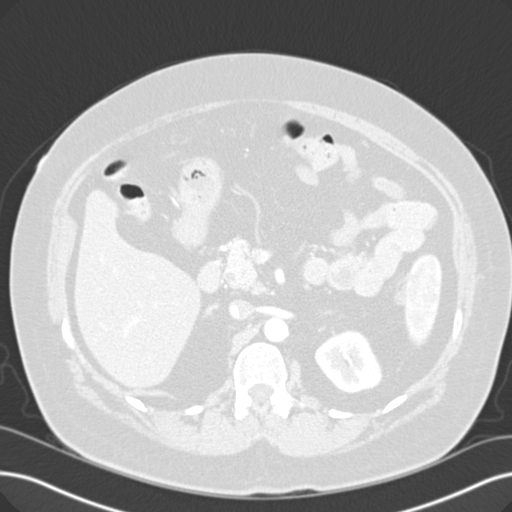
[im 12/100  lung]
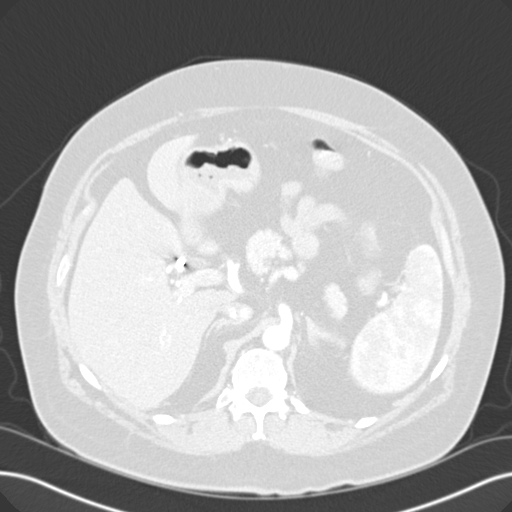
[im 19/100  lung]
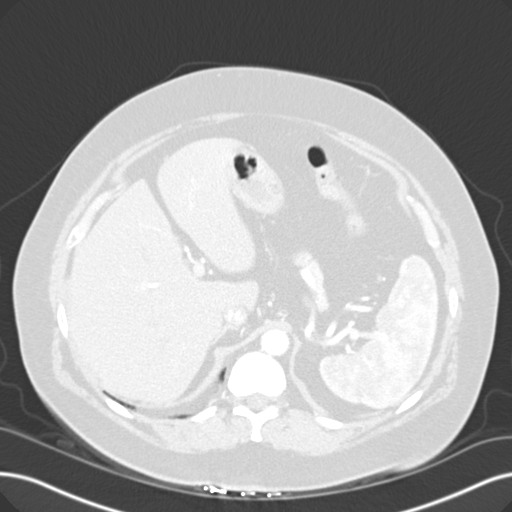
[im 23/100  lung]
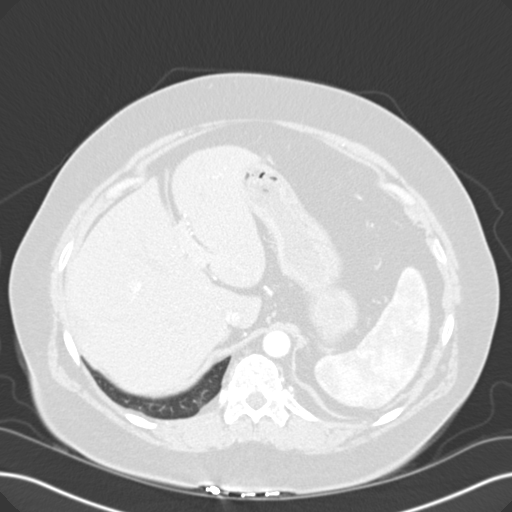
[im 30/100  mediastinal]
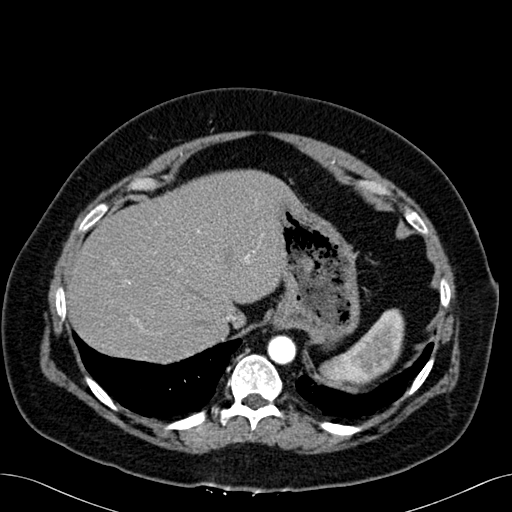
[im 30/100  lung]
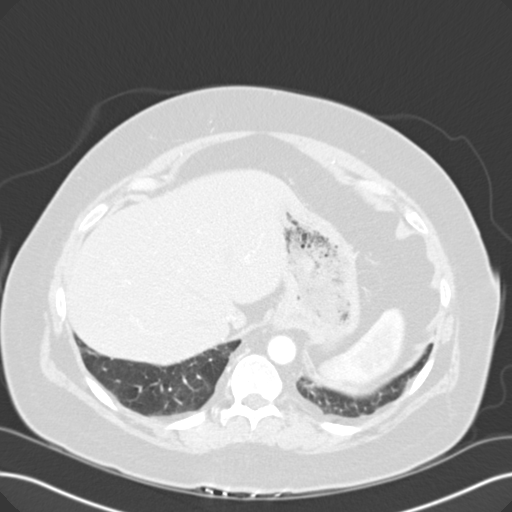
[im 37/100  lung]
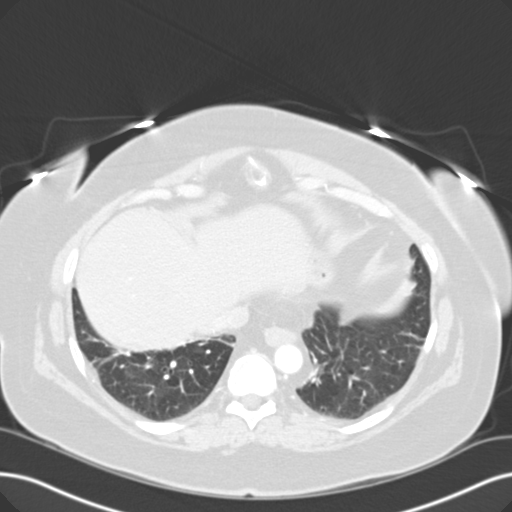
[im 41/100  lung]
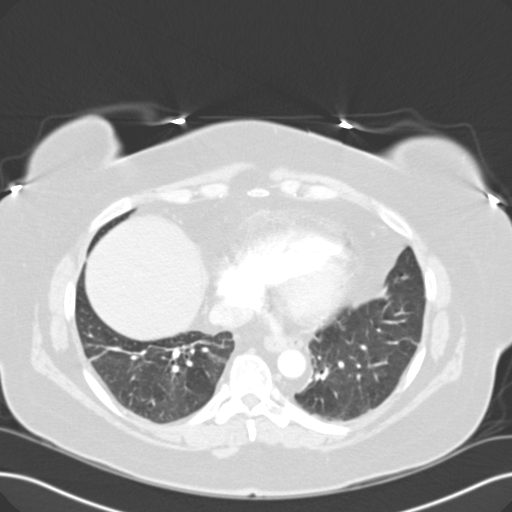
[im 48/100  lung]
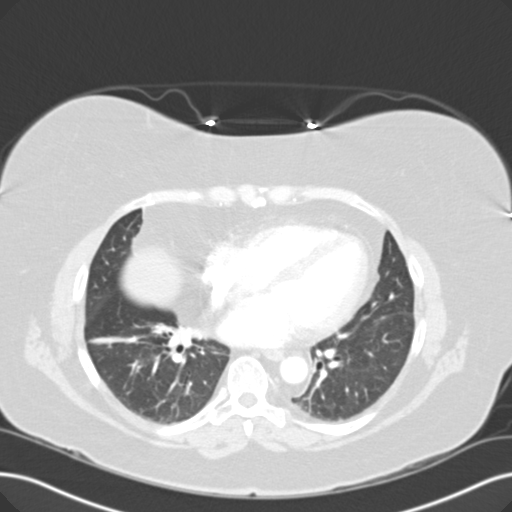
[im 53/100  mediastinal]
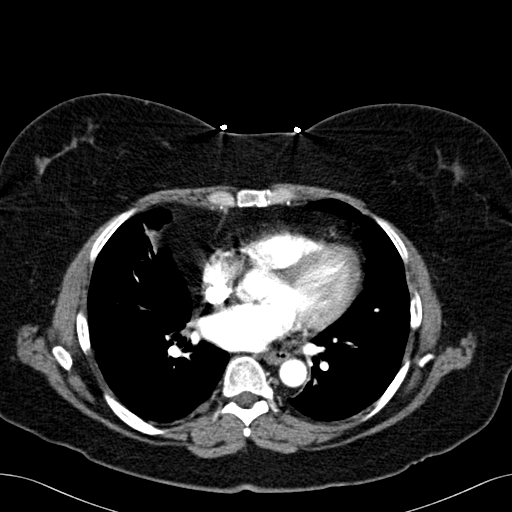
[im 53/100  lung]
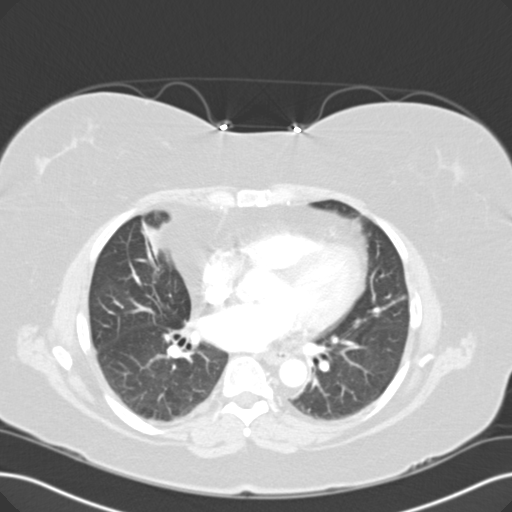
[im 59/100  lung]
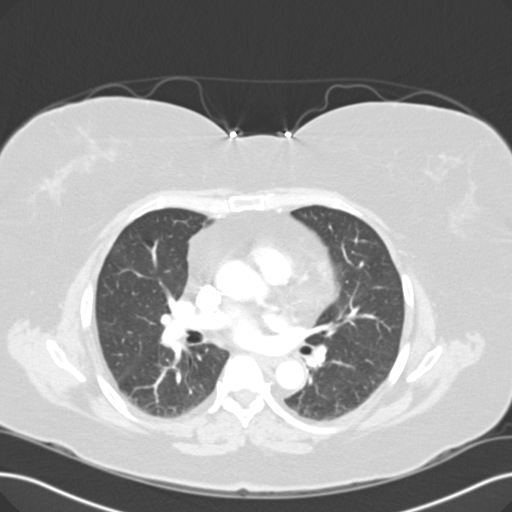
[im 63/100  lung]
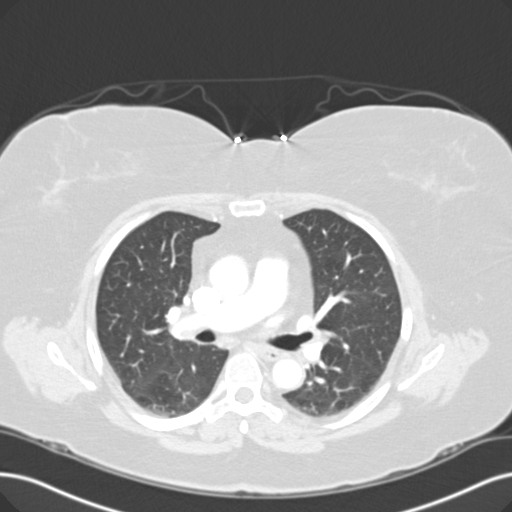
[im 70/100  lung]
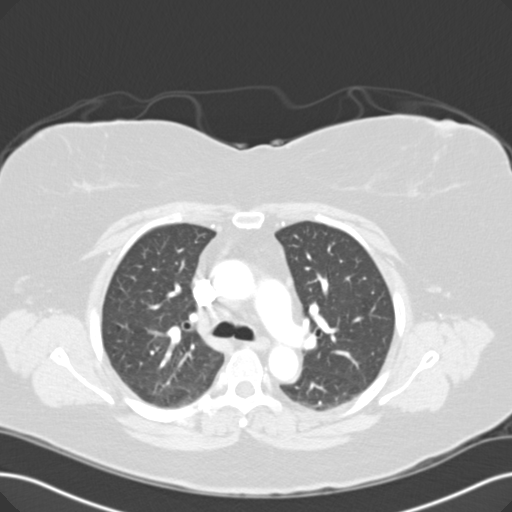
[im 78/100  mediastinal]
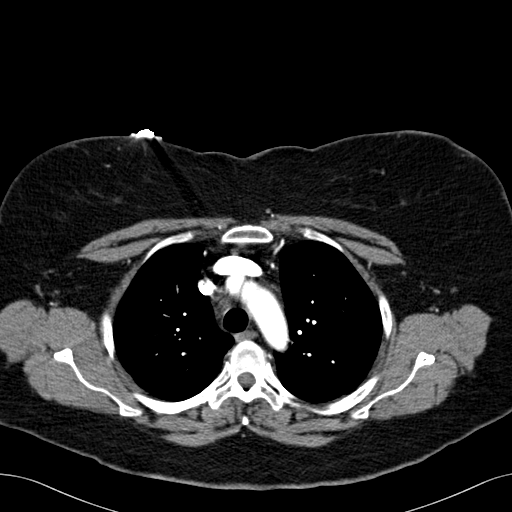
[im 78/100  lung]
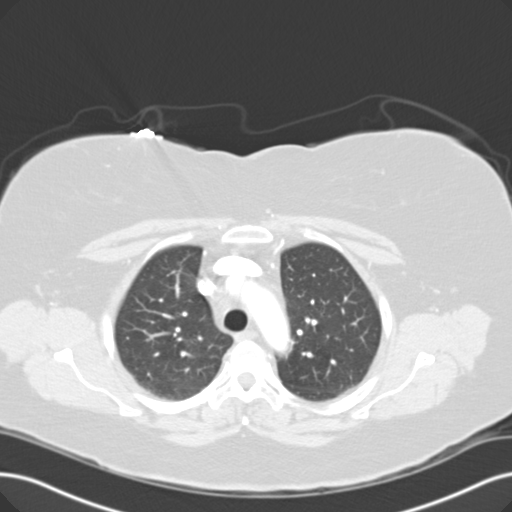
[im 81/100  lung]
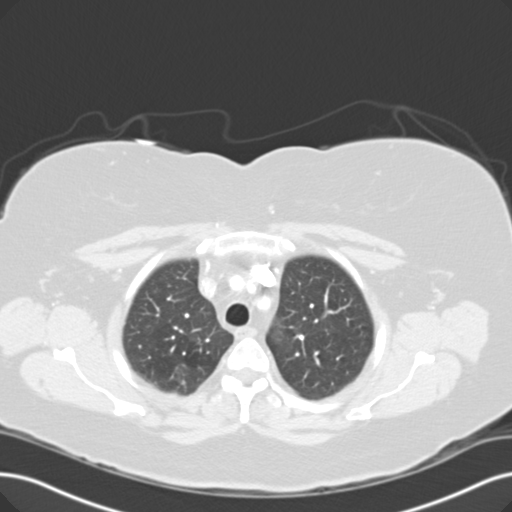
[im 89/100  lung]
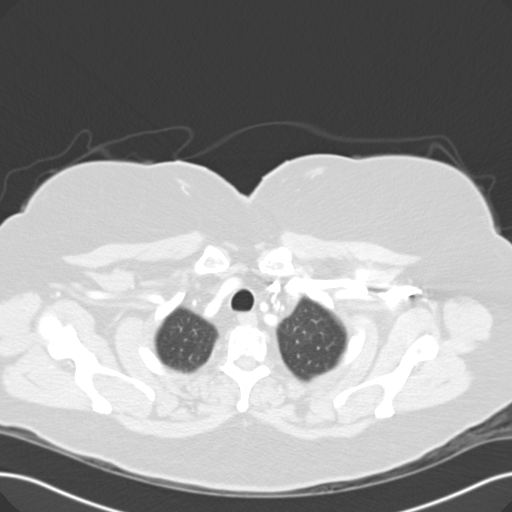
[im 96/100  lung]
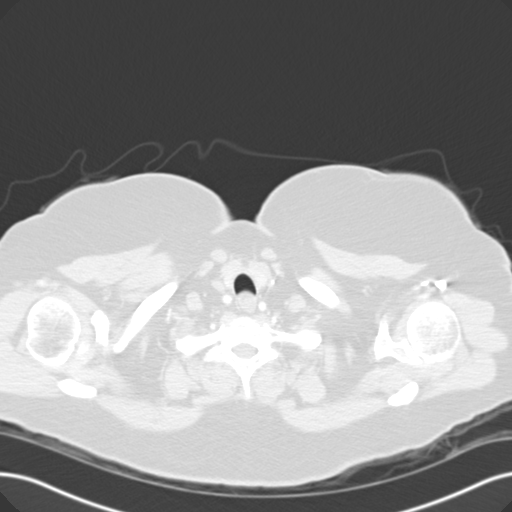

[16 of 33 positions shown; findings below may reference images not displayed]

COMPARISON STUDIES: No recent.

PROEDURE AND FINDINGS: IV contrast enhanced Chest CT is obtained. The
thoracic aorta is unremarkable.  The pulmonary arteries are normal. The
adrenals are normal. Large airways are patent.  No focal pulmonary lesions
are identified. There is mild basilar atelectasis.  Surgical clips are noted
in the gallbladder fossa.
IMPRESSION: 1)No evidence of pulmonary embolus.

## 2008-04-10 ENCOUNTER — Ambulatory Visit: Payer: Self-pay | Admitting: Internal Medicine

## 2009-04-14 ENCOUNTER — Ambulatory Visit: Payer: Self-pay | Admitting: Internal Medicine

## 2010-09-23 ENCOUNTER — Ambulatory Visit: Payer: Self-pay | Admitting: Internal Medicine

## 2010-09-23 IMAGING — MG MAM DGTL SCREENING MAMMO W/CAD
1 series · 4 of 4 positions shown · non-contrast
Comparison: none

REASON FOR EXAM: SCR
COMMENTS:

PROCEDURE:     MAM - MAM DGTL SCREENING MAMMO W/CAD  - [DATE] [DATE]
RESULT:
COMPARISONS: [DATE], [DATE], and [DATE].

[R CC · right · 4 of 4 slices shown]
[im 1/4]
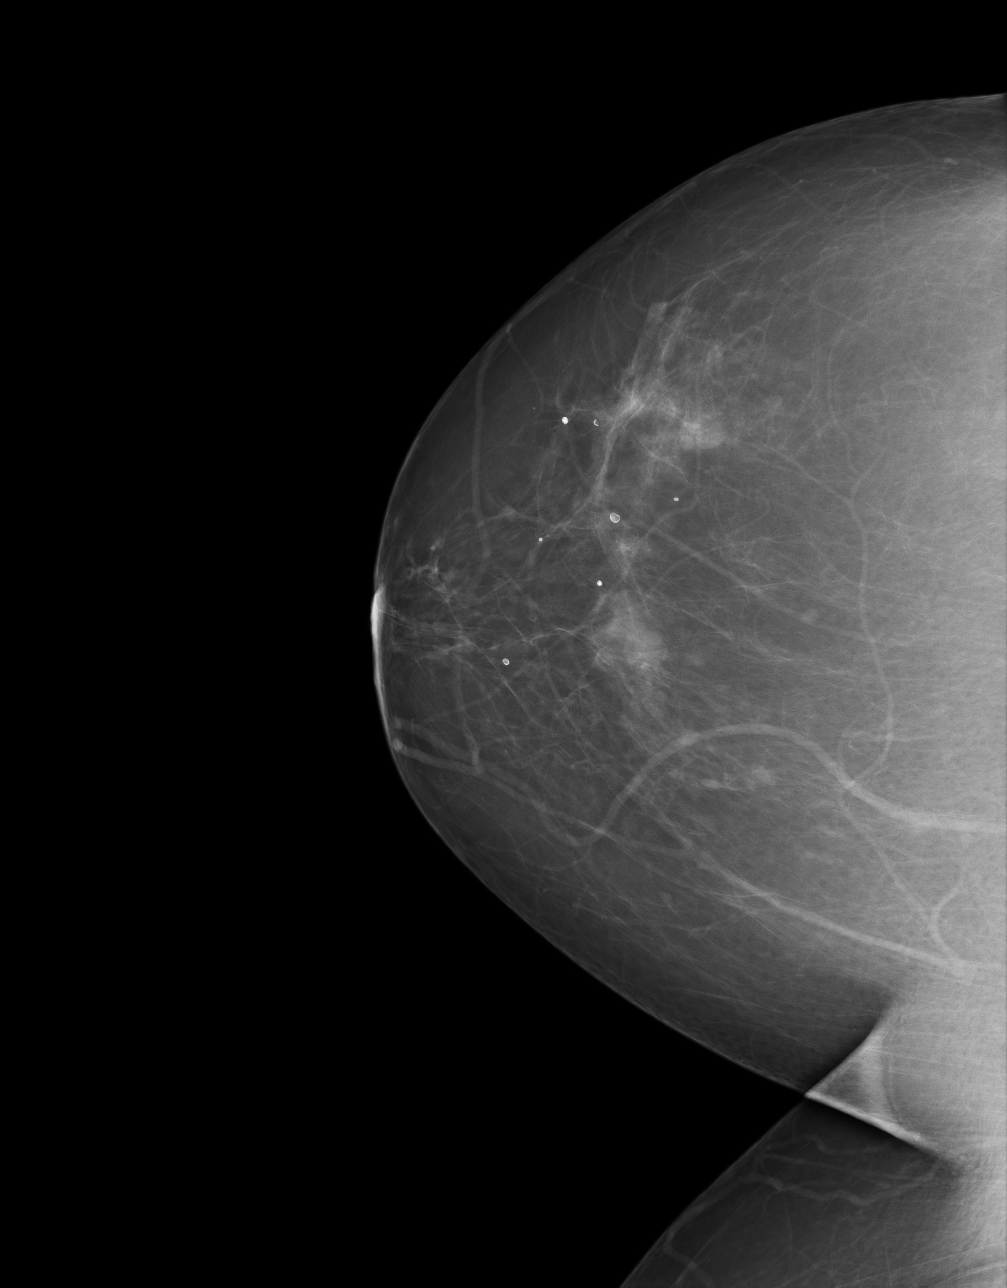
[im 2/4]
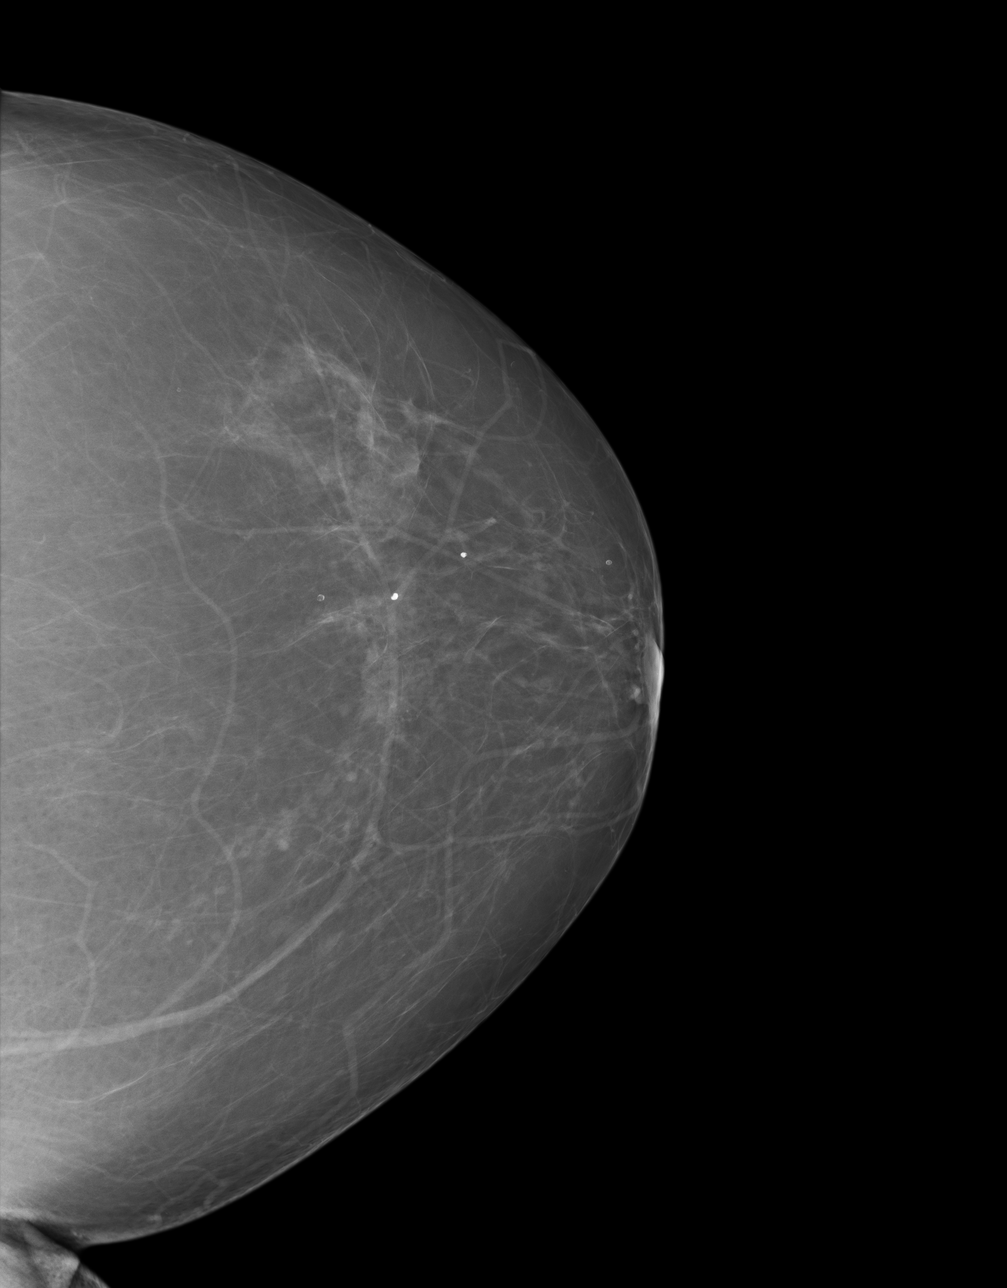
[im 3/4]
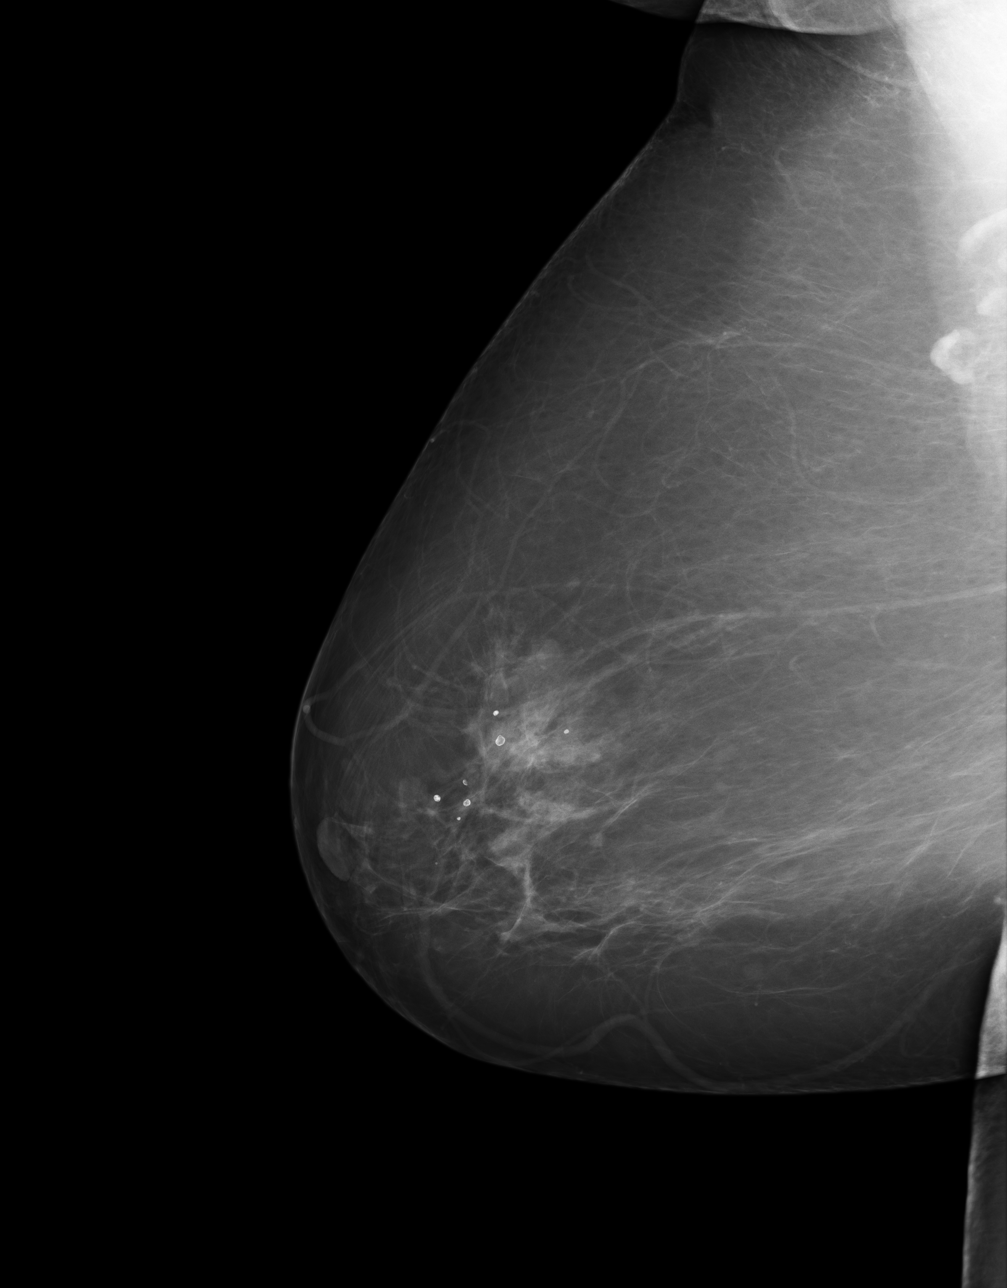
[im 4/4]
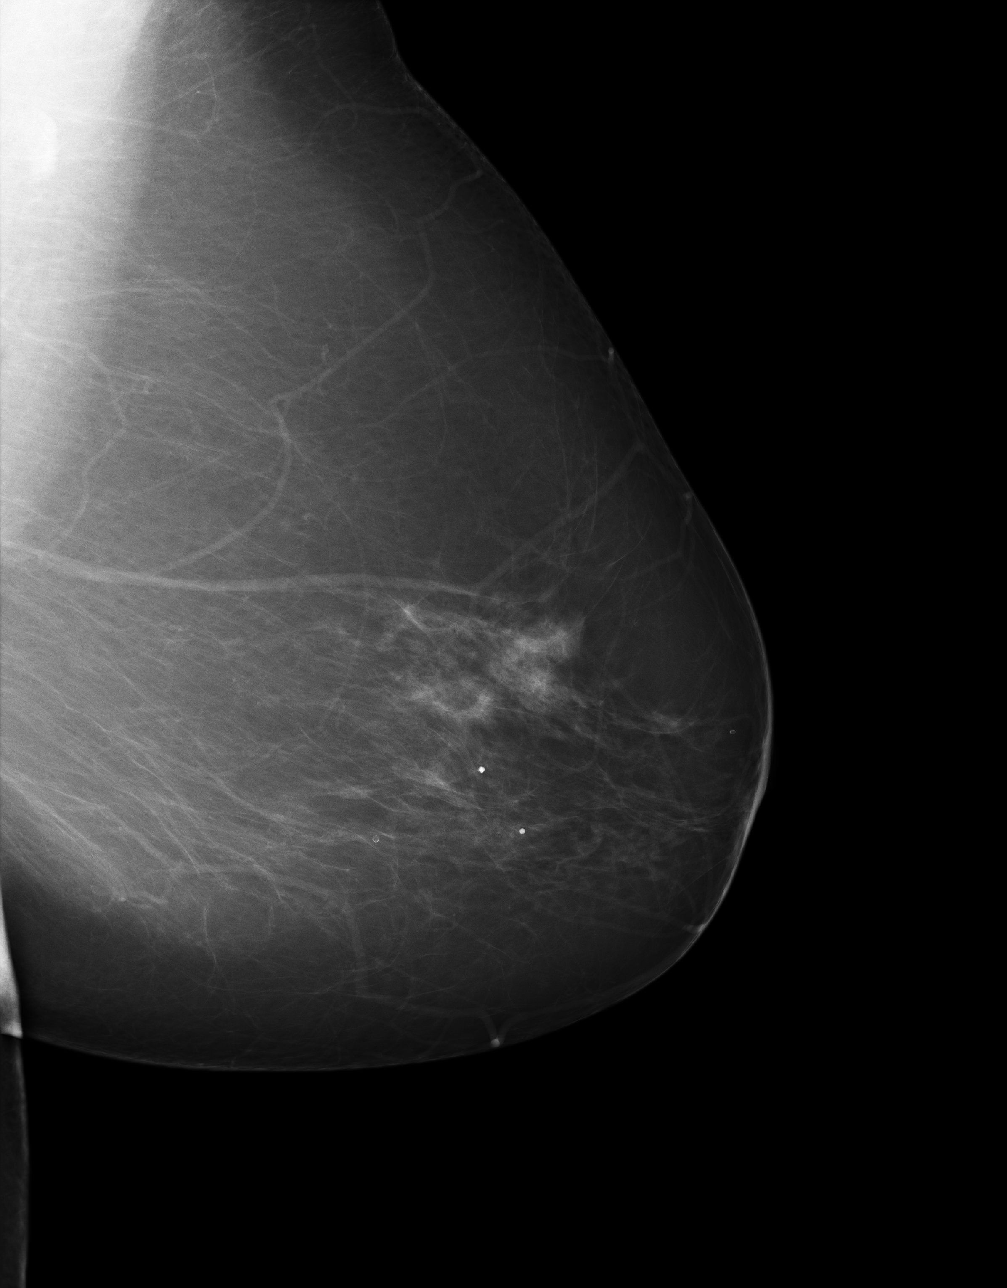

[4 of 4 positions shown; findings below may reference images not displayed]

FINDINGS: There is scattered fibroglandular tissue. There are scattered,
benign appearing calcifications. No new or suspicious masses or
calcifications are identified. No areas of architectural distortion.
IMPRESSION: BI-RADS: Category 2 - Benign Findings

Continue annual screening mammography.

A NEGATIVE MAMMOGRAM REPORT DOES NOT PRECLUDE BIOPSY OR OTHER EVALUATION OF
A CLINICALLY PALPABLE OR OTHERWISE SUSPICIOUS MASS OR LESION. BREAST CANCER
MAY NOT BE DETECTED BY MAMMOGRAPHY IN UP TO 10% OF CASES.

## 2011-10-26 ENCOUNTER — Ambulatory Visit: Payer: Self-pay | Admitting: Internal Medicine

## 2011-10-26 IMAGING — MG MAM DGTL SCRN MAM NO ORDER W/CAD
1 series · 4 of 4 positions shown · non-contrast
Comparison: none

REASON FOR EXAM: SCR MAMMO NO ORDER
COMMENTS:

PROCEDURE:     MAM - MAM DGTL SCRN MAM NO ORDER W/CAD  - [DATE]  [DATE]
RESULT:
No dominant masses or pathologic clustered calcifications demonstrated.
Nodular parenchymal pattern with benign calcifications present. CAD
evaluation is nonfocal.

[Series 9212: R CC · right · 4 of 4 slices shown]
[im 1/4]
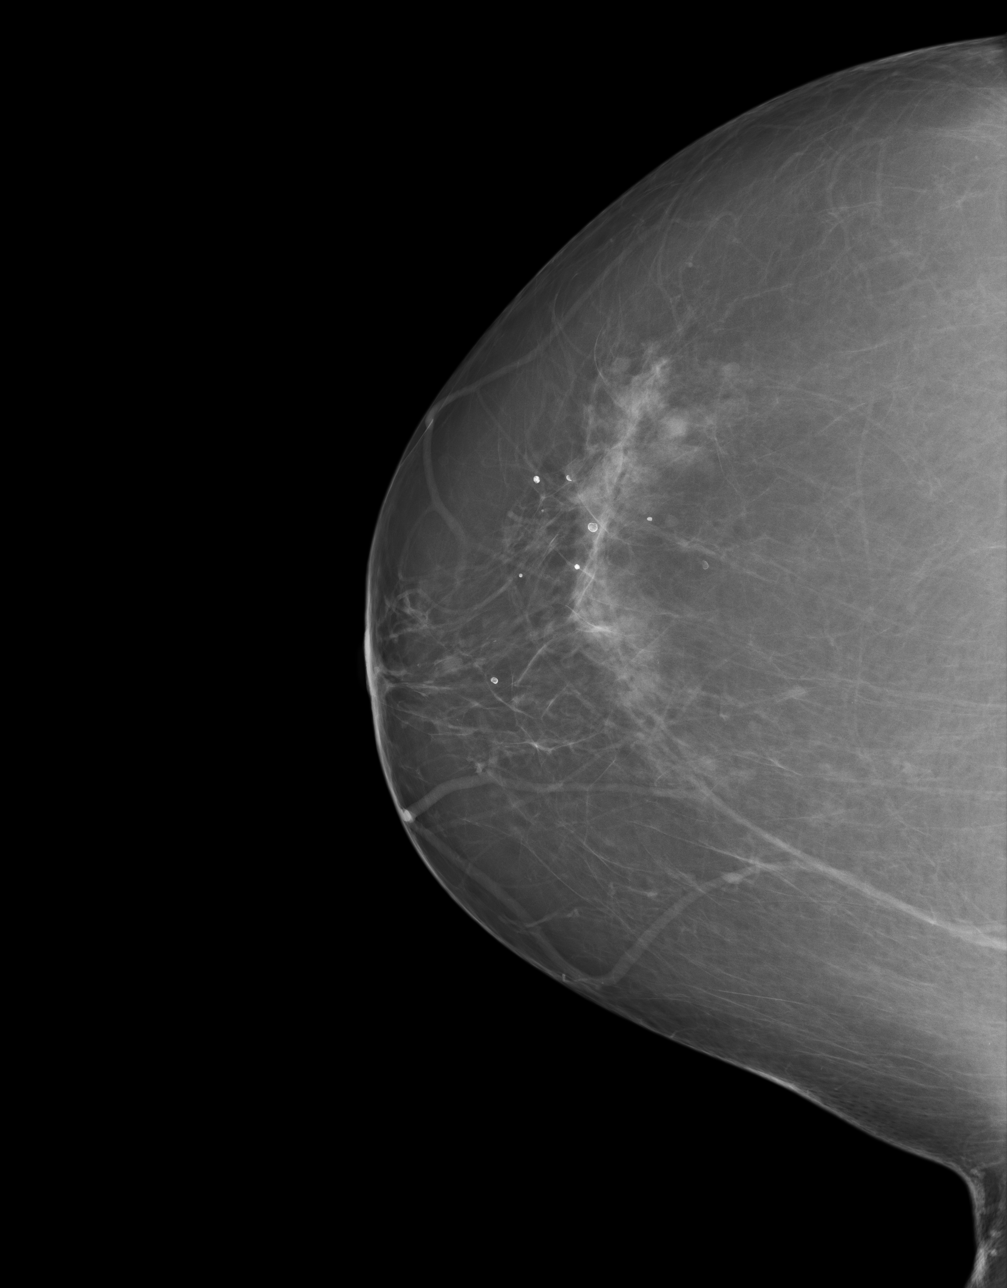
[im 2/4]
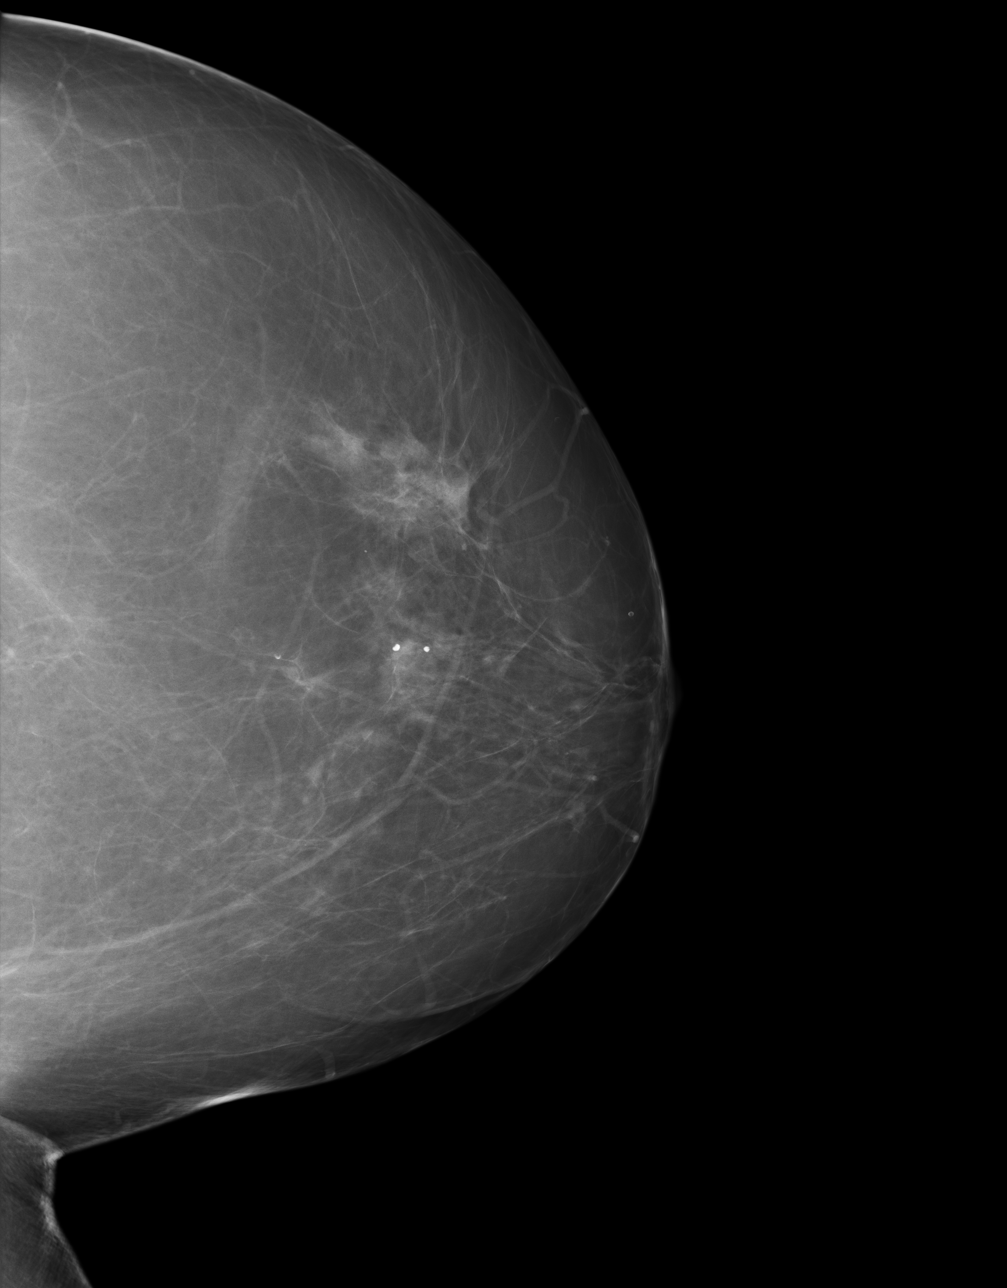
[im 3/4]
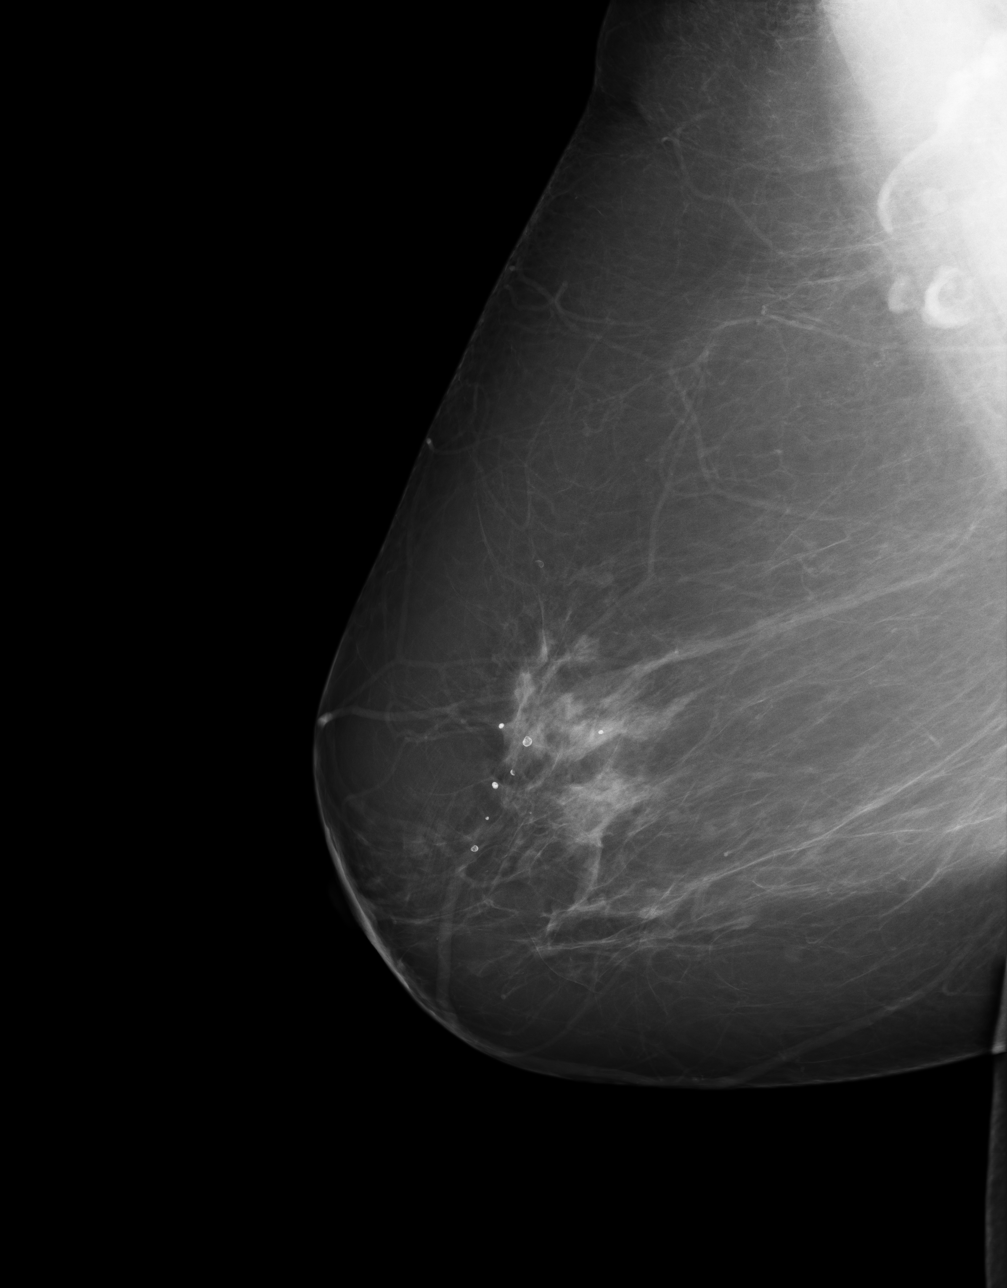
[im 4/4]
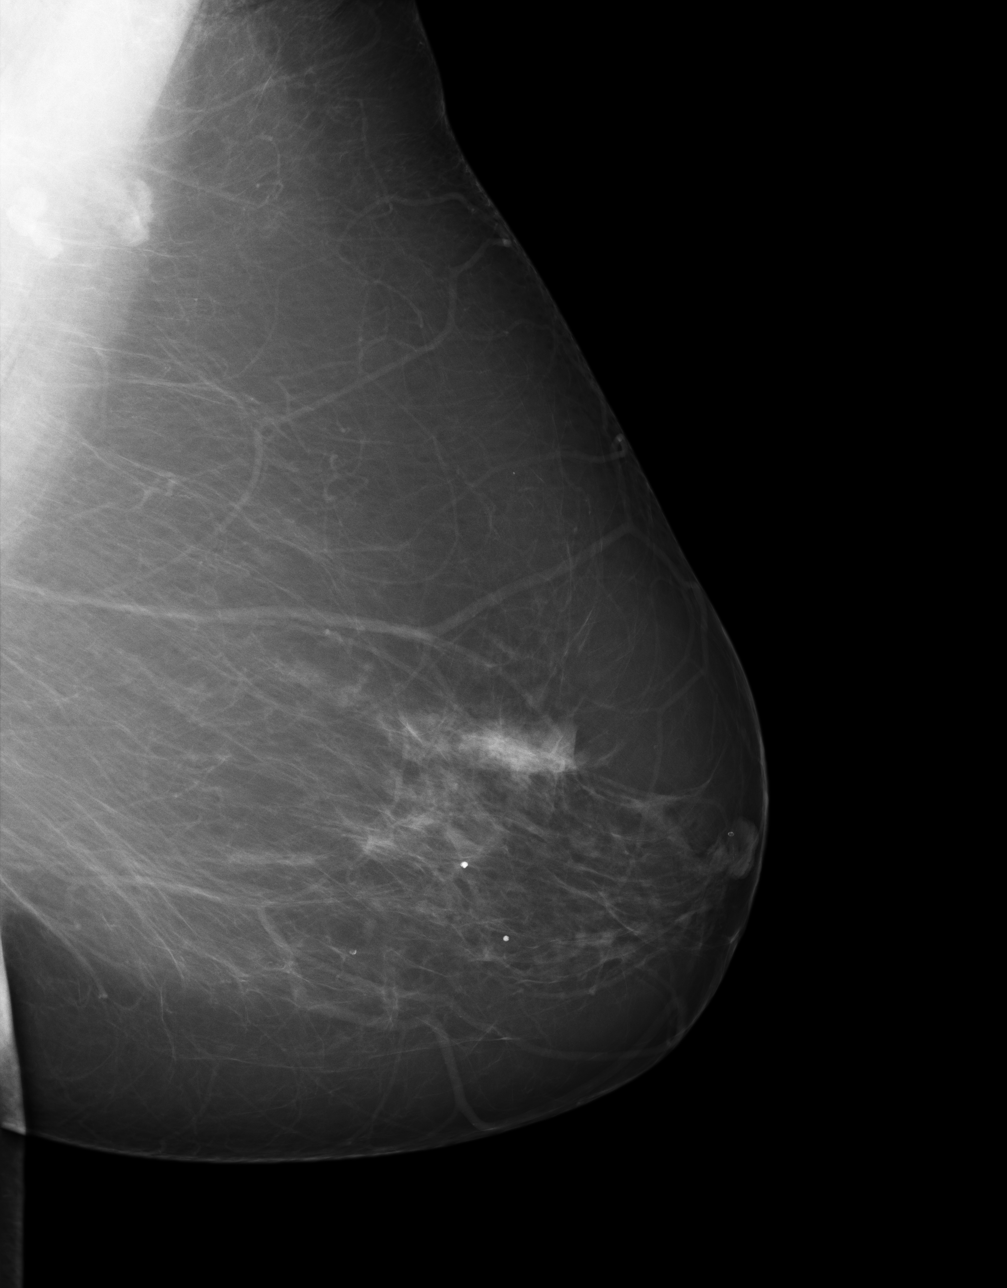

[4 of 4 positions shown; findings below may reference images not displayed]

IMPRESSION: Benign exam. Routine yearly follow-up mammograms suggested.

BI-RADS: Category 2 - Benign Findings

Thank you for this opportunity to contribute to the care of your patient.

A NEGATIVE MAMMOGRAM REPORT DOES NOT PRECLUDE BIOPSY OR OTHER EVALUATION OF
A CLINICALLY PALPABLE OR OTHERWISE SUSPICIOUS MASS OR LESION. BREAST CANCER
MAY NOT BE DETECTED BY MAMMOGRAPHY IN UP TO 10% OF CASES.

## 2011-12-20 HISTORY — PX: EYE SURGERY: SHX253

## 2012-10-30 ENCOUNTER — Ambulatory Visit: Payer: Self-pay | Admitting: Internal Medicine

## 2012-10-30 IMAGING — MG MM CAD SCREENING MAMMO
1 series · 6 of 6 positions shown · non-contrast
Comparison: none

REASON FOR EXAM: SCR MAMMO NO ORDER
COMMENTS:

PROCEDURE:     MAM - MAM DGTL SCRN MAM NO ORDER W/CAD  - [DATE]  [DATE]
RESULT:     Fibroglandular parenchymal pattern. No mass. No pathologic
clustered calcification. Benign calcifications. CAD evaluation is nonfocal.
Stable exam from multiple prior exams.

[R CC · right · 6 of 6 slices shown]
[im 1/6]
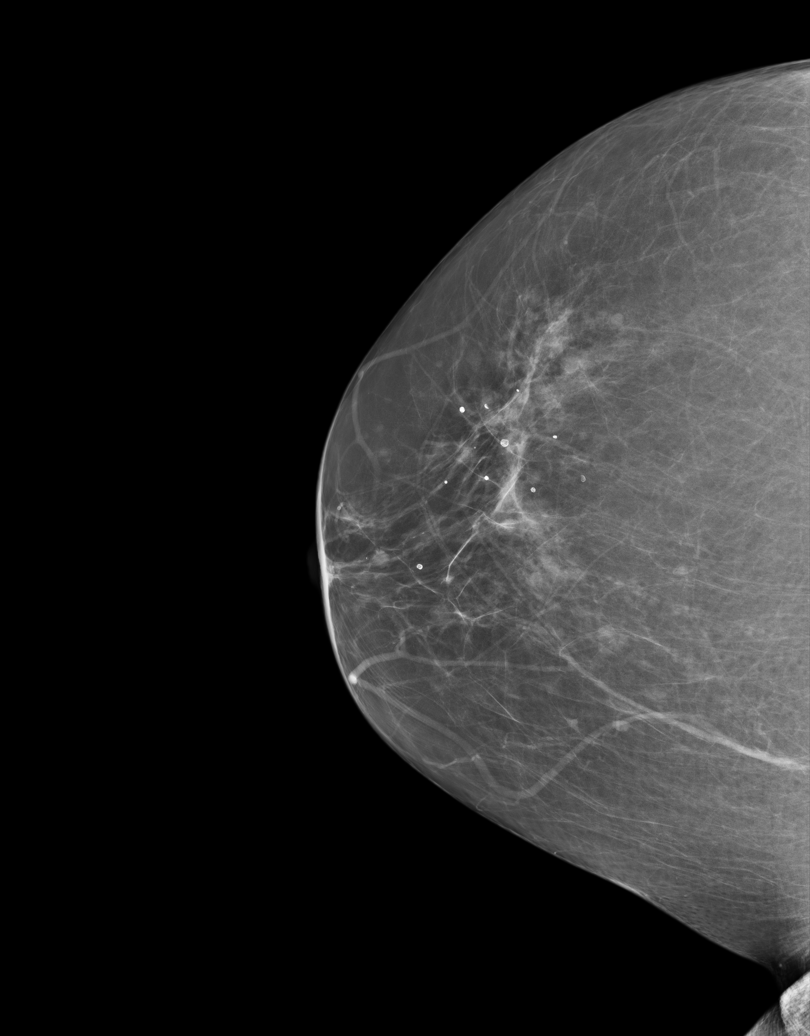
[im 2/6]
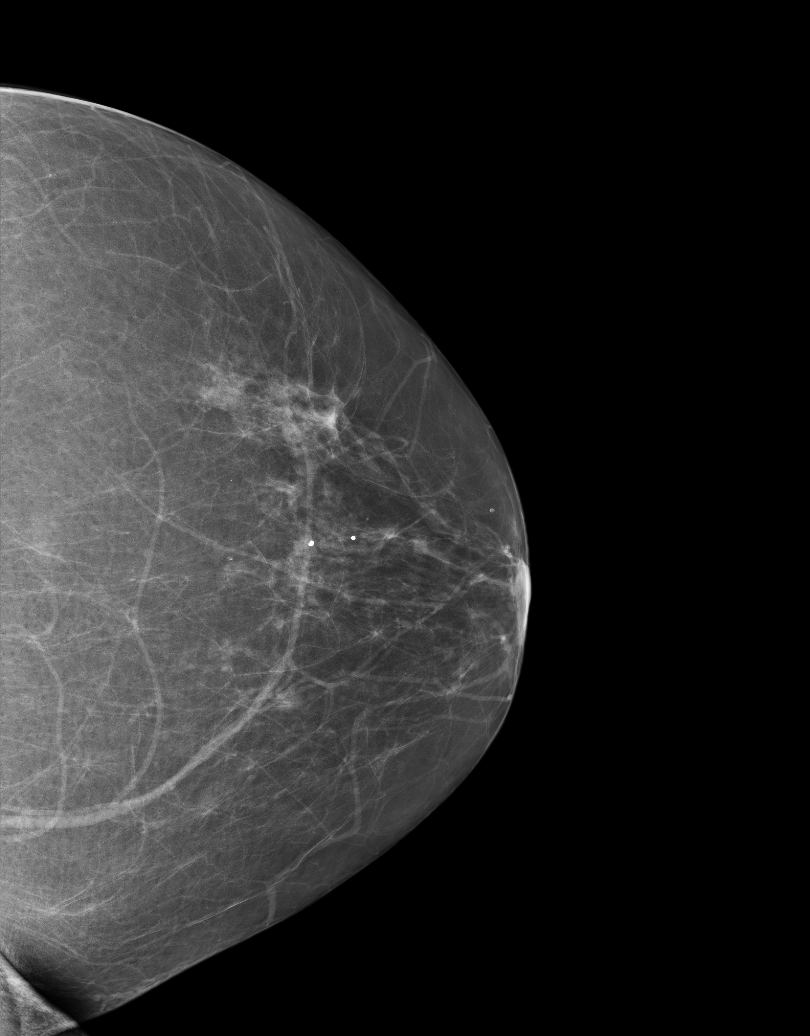
[im 3/6]
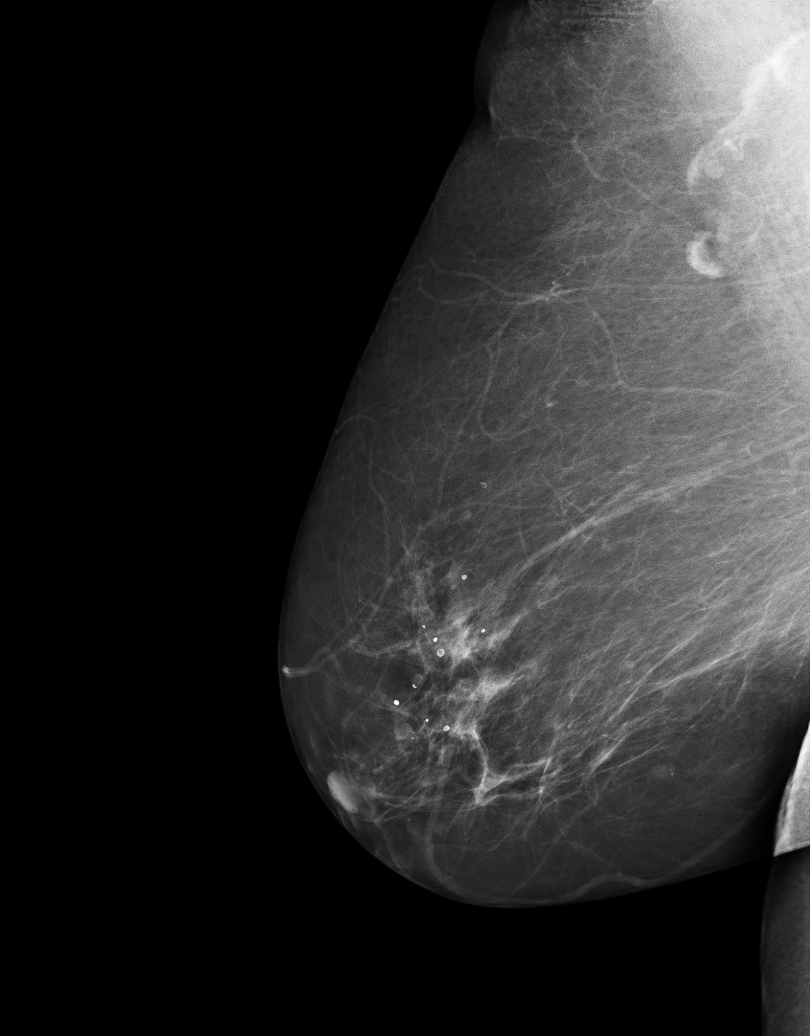
[im 4/6]
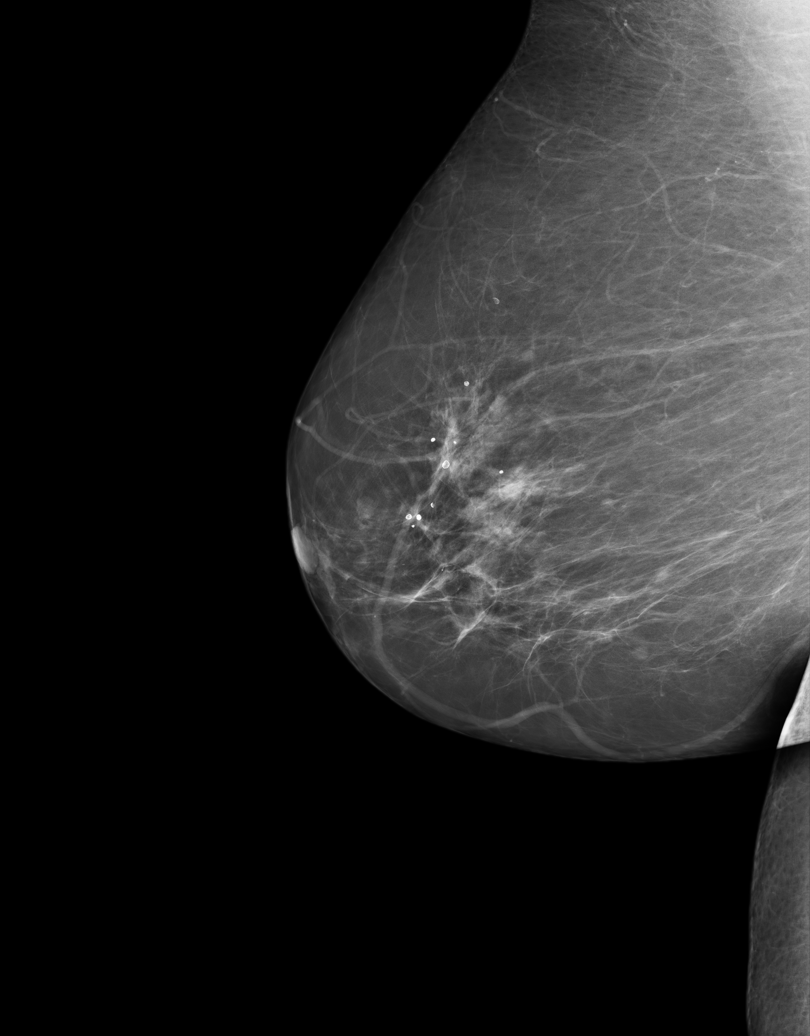
[im 5/6]
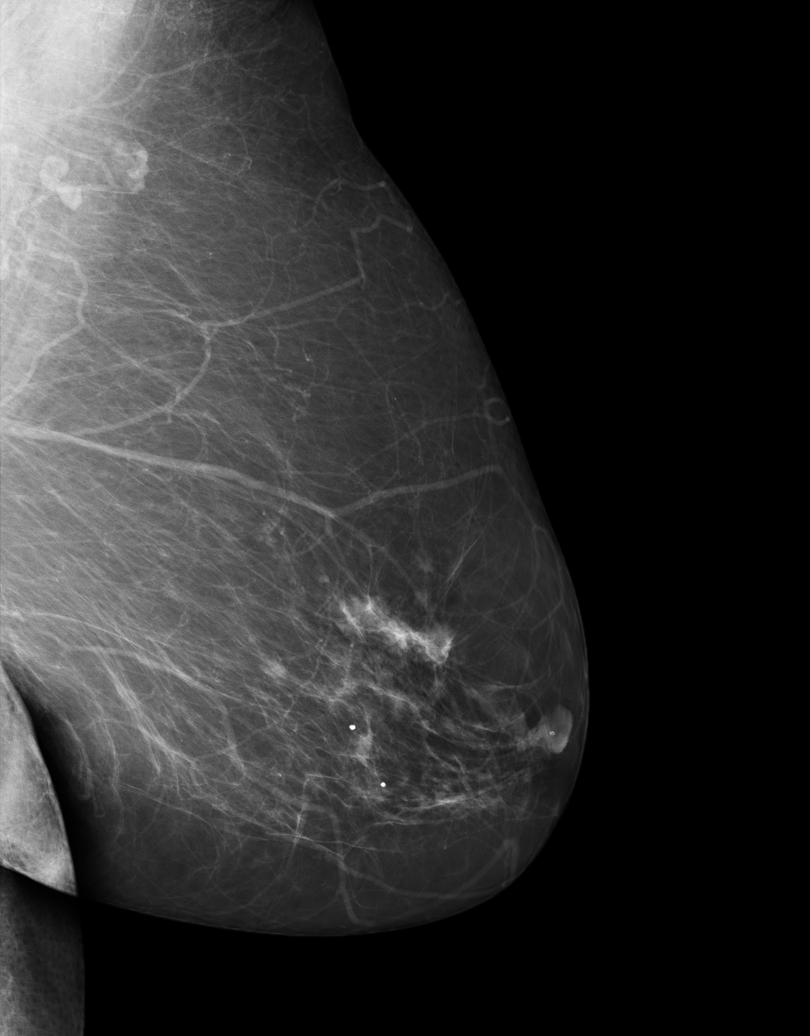
[im 6/6]
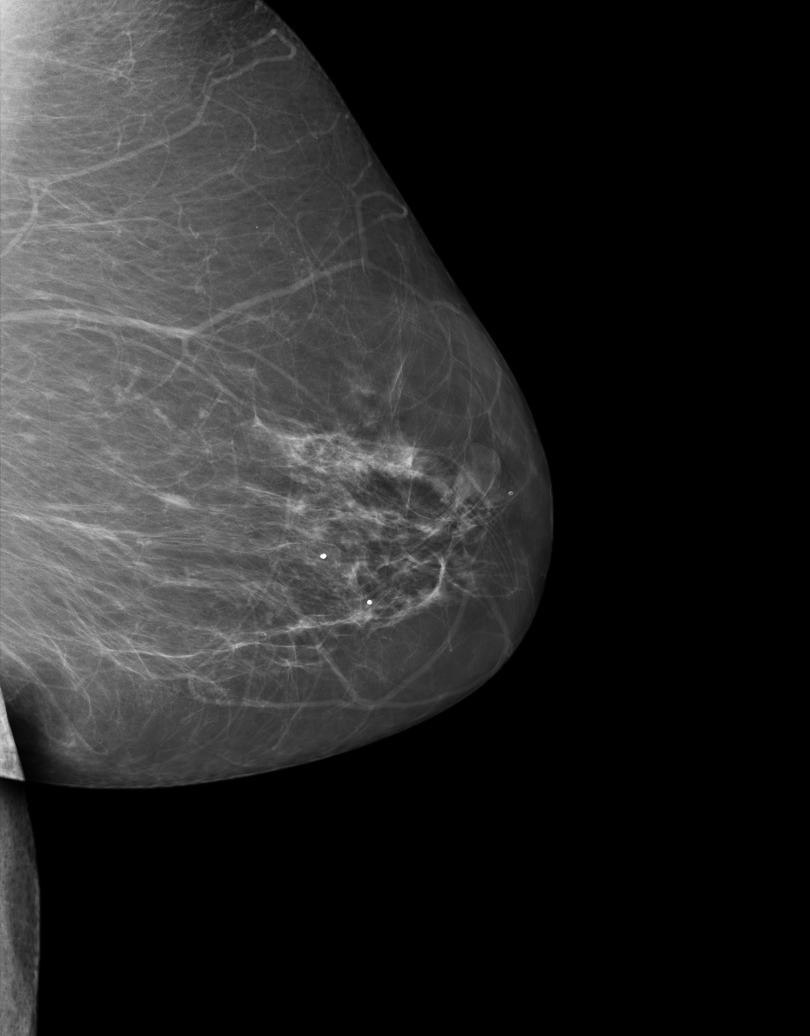

[6 of 6 positions shown; findings below may reference images not displayed]

IMPRESSION: Stable benign exam.

BI-RADS: Category 2- Benign Finding

A NEGATIVE MAMMOGRAM REPORT DOES NOT PRECLUDE BIOPSY OR OTHER EVALUATION OF
A CLINICALLY PALPABLE OR OTHERWISE SUSPICIOUS MASS OR LESION. BREAST CANCER
MAY NOT BE DETECTED IN UP TO 10% OF CASES.

## 2013-03-27 ENCOUNTER — Ambulatory Visit: Payer: Self-pay | Admitting: Internal Medicine

## 2014-04-15 ENCOUNTER — Inpatient Hospital Stay: Payer: Self-pay | Admitting: Internal Medicine

## 2014-04-15 LAB — CBC
HCT: 44.4 % (ref 35.0–47.0)
HGB: 14.7 g/dL (ref 12.0–16.0)
MCH: 29.9 pg (ref 26.0–34.0)
MCHC: 33 g/dL (ref 32.0–36.0)
MCV: 91 fL (ref 80–100)
PLATELETS: 183 10*3/uL (ref 150–440)
RBC: 4.9 10*6/uL (ref 3.80–5.20)
RDW: 12.9 % (ref 11.5–14.5)
WBC: 11.5 10*3/uL — AB (ref 3.6–11.0)

## 2014-04-15 LAB — COMPREHENSIVE METABOLIC PANEL
ALBUMIN: 3.8 g/dL (ref 3.4–5.0)
Alkaline Phosphatase: 109 U/L
Anion Gap: 7 (ref 7–16)
BILIRUBIN TOTAL: 0.7 mg/dL (ref 0.2–1.0)
BUN: 11 mg/dL (ref 7–18)
Calcium, Total: 8.8 mg/dL (ref 8.5–10.1)
Chloride: 97 mmol/L — ABNORMAL LOW (ref 98–107)
Co2: 30 mmol/L (ref 21–32)
Creatinine: 1.12 mg/dL (ref 0.60–1.30)
EGFR (African American): 59 — ABNORMAL LOW
EGFR (Non-African Amer.): 51 — ABNORMAL LOW
Glucose: 133 mg/dL — ABNORMAL HIGH (ref 65–99)
Osmolality: 270 (ref 275–301)
Potassium: 3.6 mmol/L (ref 3.5–5.1)
SGOT(AST): 41 U/L — ABNORMAL HIGH (ref 15–37)
SGPT (ALT): 21 U/L (ref 12–78)
SODIUM: 134 mmol/L — AB (ref 136–145)
TOTAL PROTEIN: 7.6 g/dL (ref 6.4–8.2)

## 2014-04-15 LAB — TROPONIN I

## 2014-04-15 LAB — PRO B NATRIURETIC PEPTIDE: B-TYPE NATIURETIC PEPTID: 62 pg/mL (ref 0–125)

## 2014-04-15 IMAGING — CT CT ANGIO CHEST
2 of 6 series · 18 of 36 positions shown · IV contrast (APPLIED)
Comparison: CT scan of [DATE].

CLINICAL DATA: Tachycardia, shortness of breath.

EXAM:
CT ANGIOGRAPHY CHEST WITH CONTRAST
TECHNIQUE: Multidetector CT imaging of the chest was performed using the
standard protocol during bolus administration of intravenous
contrast. Multiplanar CT image reconstructions and MIPs were
obtained to evaluate the vascular anatomy.
CONTRAST:  100 mL of [7H] intravenously.

[Series 5: pe 1.0 thins · axial · 0.68mm/px · z∈[-646,-395]mm · 17 of 283 slices shown]
[im 16/283  lung]
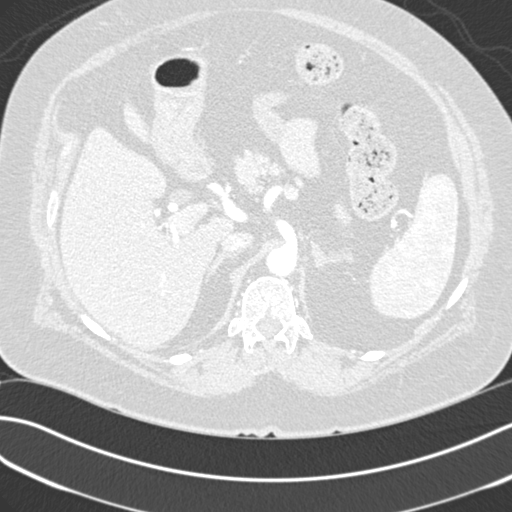
[im 32/283  mediastinal]
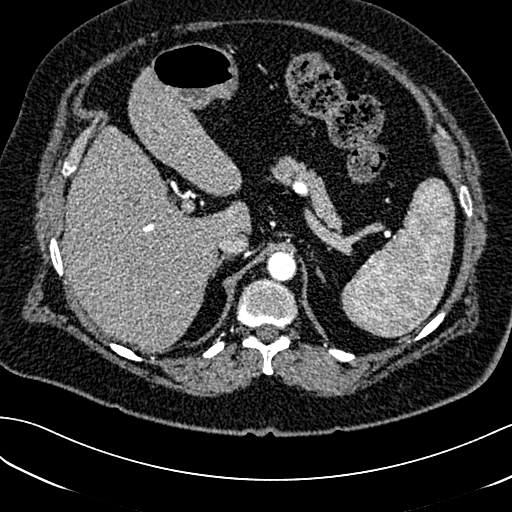
[im 48/283  lung]
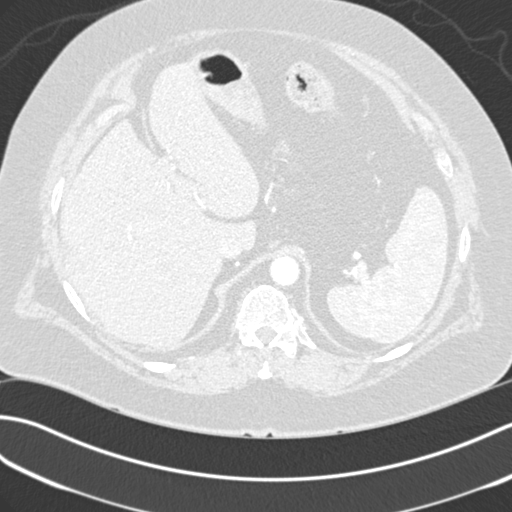
[im 63/283  mediastinal]
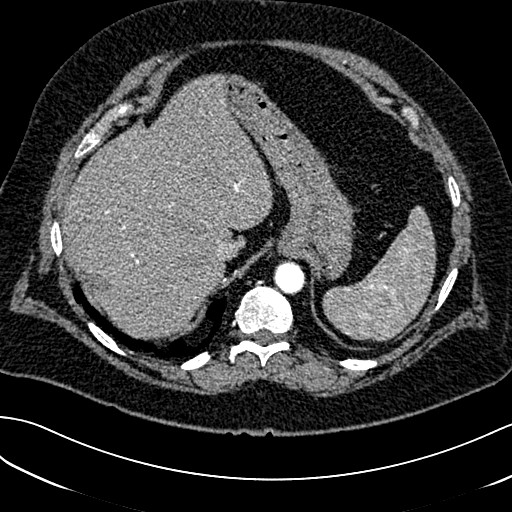
[im 79/283  lung]
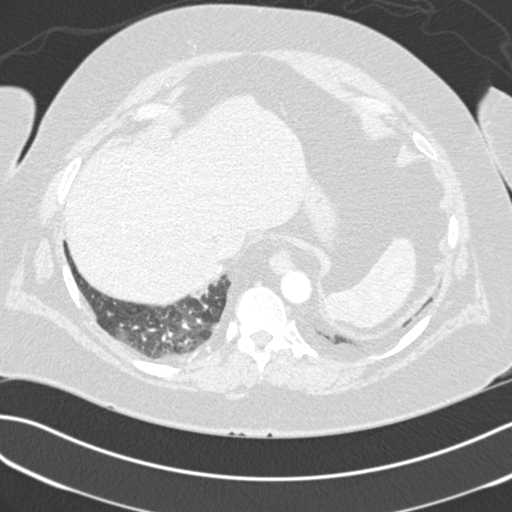
[im 95/283  mediastinal]
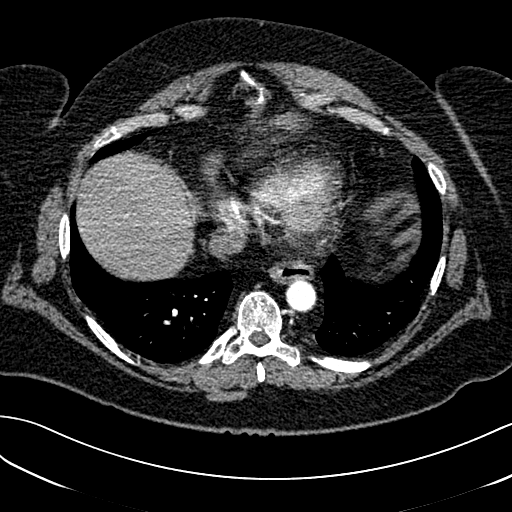
[im 110/283  lung]
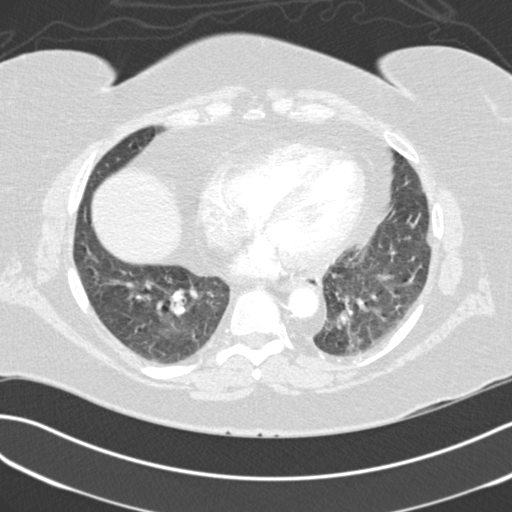
[im 126/283  mediastinal]
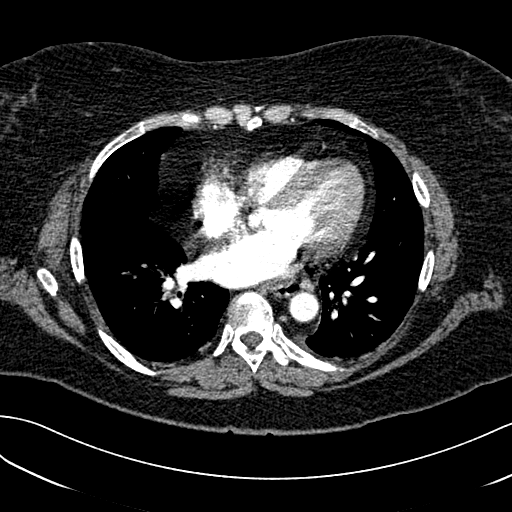
[im 142/283  lung]
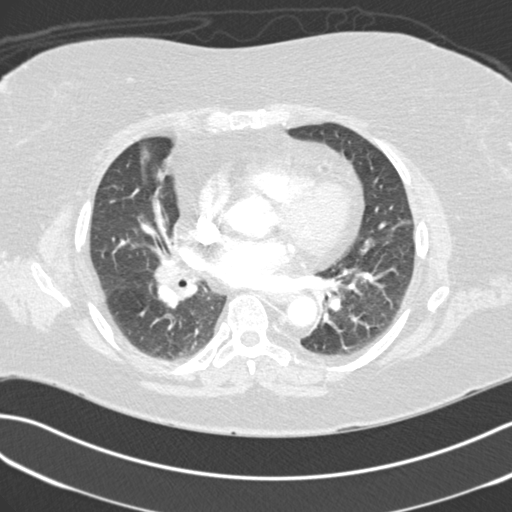
[im 157/283  mediastinal]
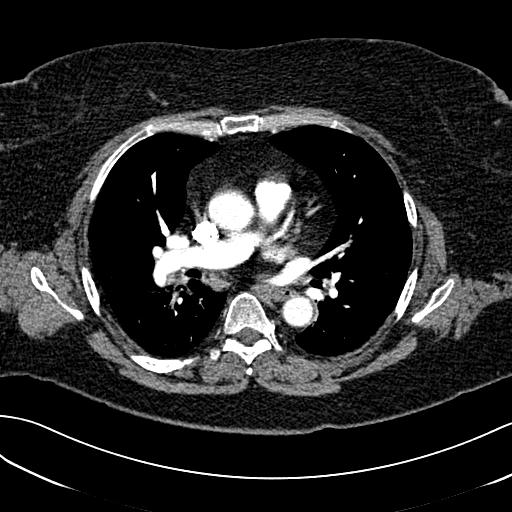
[im 173/283  lung]
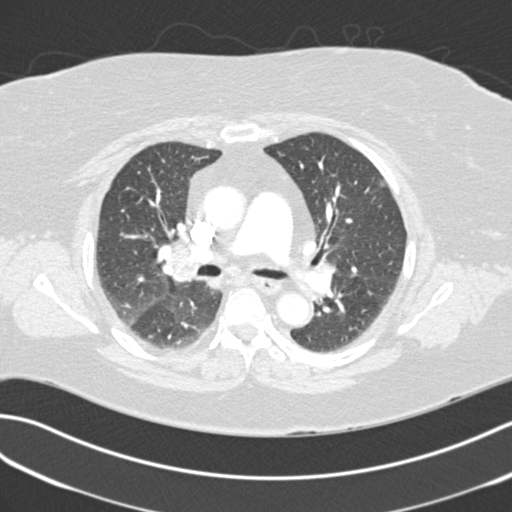
[im 189/283  mediastinal]
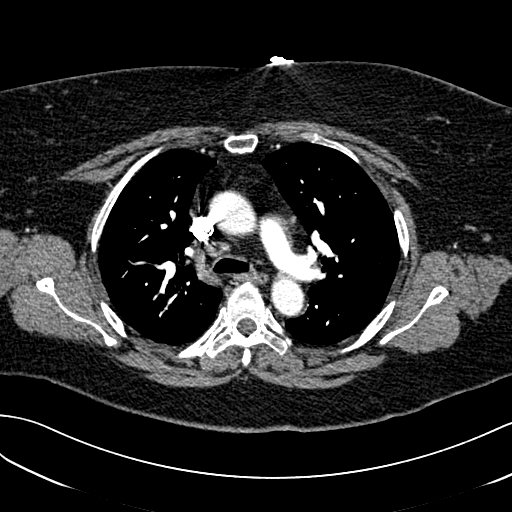
[im 204/283  lung]
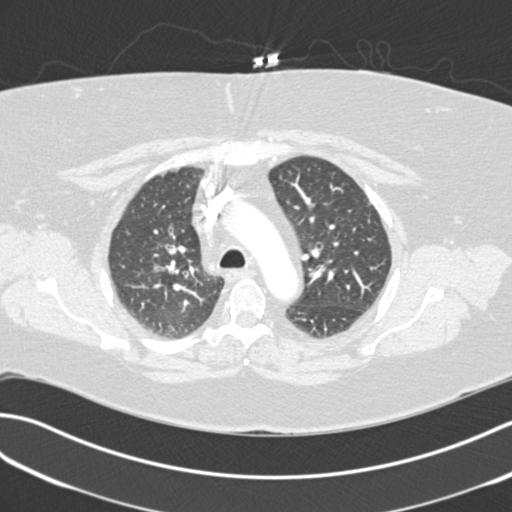
[im 220/283  mediastinal]
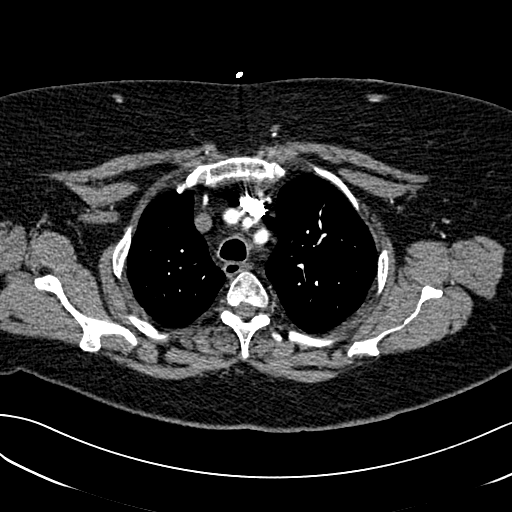
[im 236/283  lung]
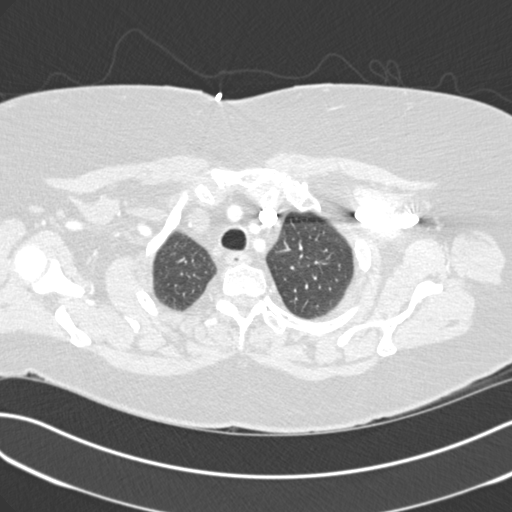
[im 251/283  mediastinal]
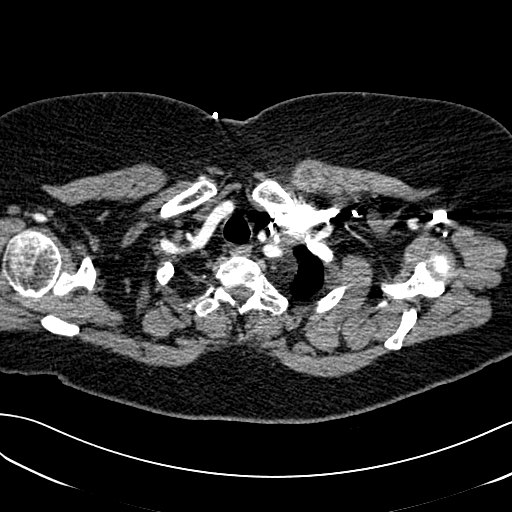
[im 267/283  lung]
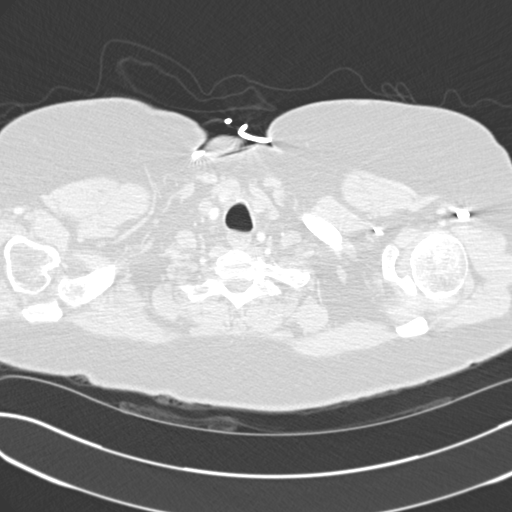

[Series 7: cor pe 2.0 mpr · coronal · 0.68mm/px · 1 of 137 slices shown]
[im 69/137  mediastinal]
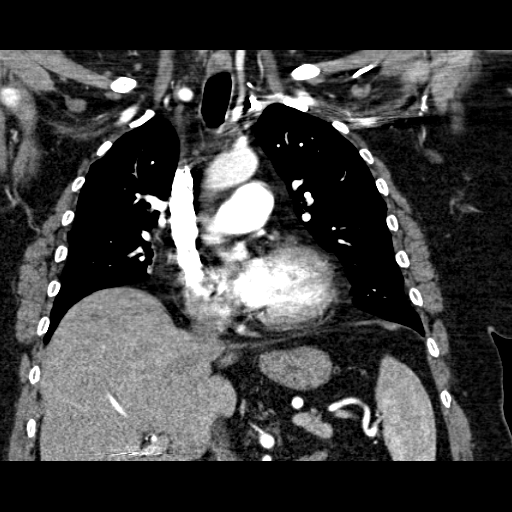

[18 of 36 positions shown; findings below may reference images not displayed]

FINDINGS: No pneumothorax or pleural effusion is noted. Minimal bilateral
posterior basilar subsegmental atelectasis is noted. Thoracic aorta
appears normal. Right hilar lymph node measuring 17 x 15 mm is noted
which is increased compared to prior exam precarinal lymph node is
noted which is unchanged compared to prior exam. No other
significant adenopathy is noted. There is no evidence of pulmonary
embolus. Visualized portion of upper abdomen appears normal.

Review of the MIP images confirms the above findings.
IMPRESSION: No evidence of pulmonary embolus.

Minimal bilateral posterior basilar subsegmental atelectasis is
noted.

17 mm right hilar lymph node is noted which is increased in size
compared to prior exam. This may simply be inflammatory or reactive
in etiology, but followup CT scan in 6 months is recommended.

## 2014-04-15 IMAGING — CR DG CHEST 2V
1 series · 2 of 2 positions shown · non-contrast
Comparison: CT CHEST W/ CM dated [DATE]; DG CHEST 2V dated
[DATE]

CLINICAL DATA: non smoker, shortness of breath, cough x 1 month

EXAM:
CHEST  2 VIEW

[Series 1: w chest pa · 0.14mm/px · 2 of 2 slices shown]
[im 1/2]
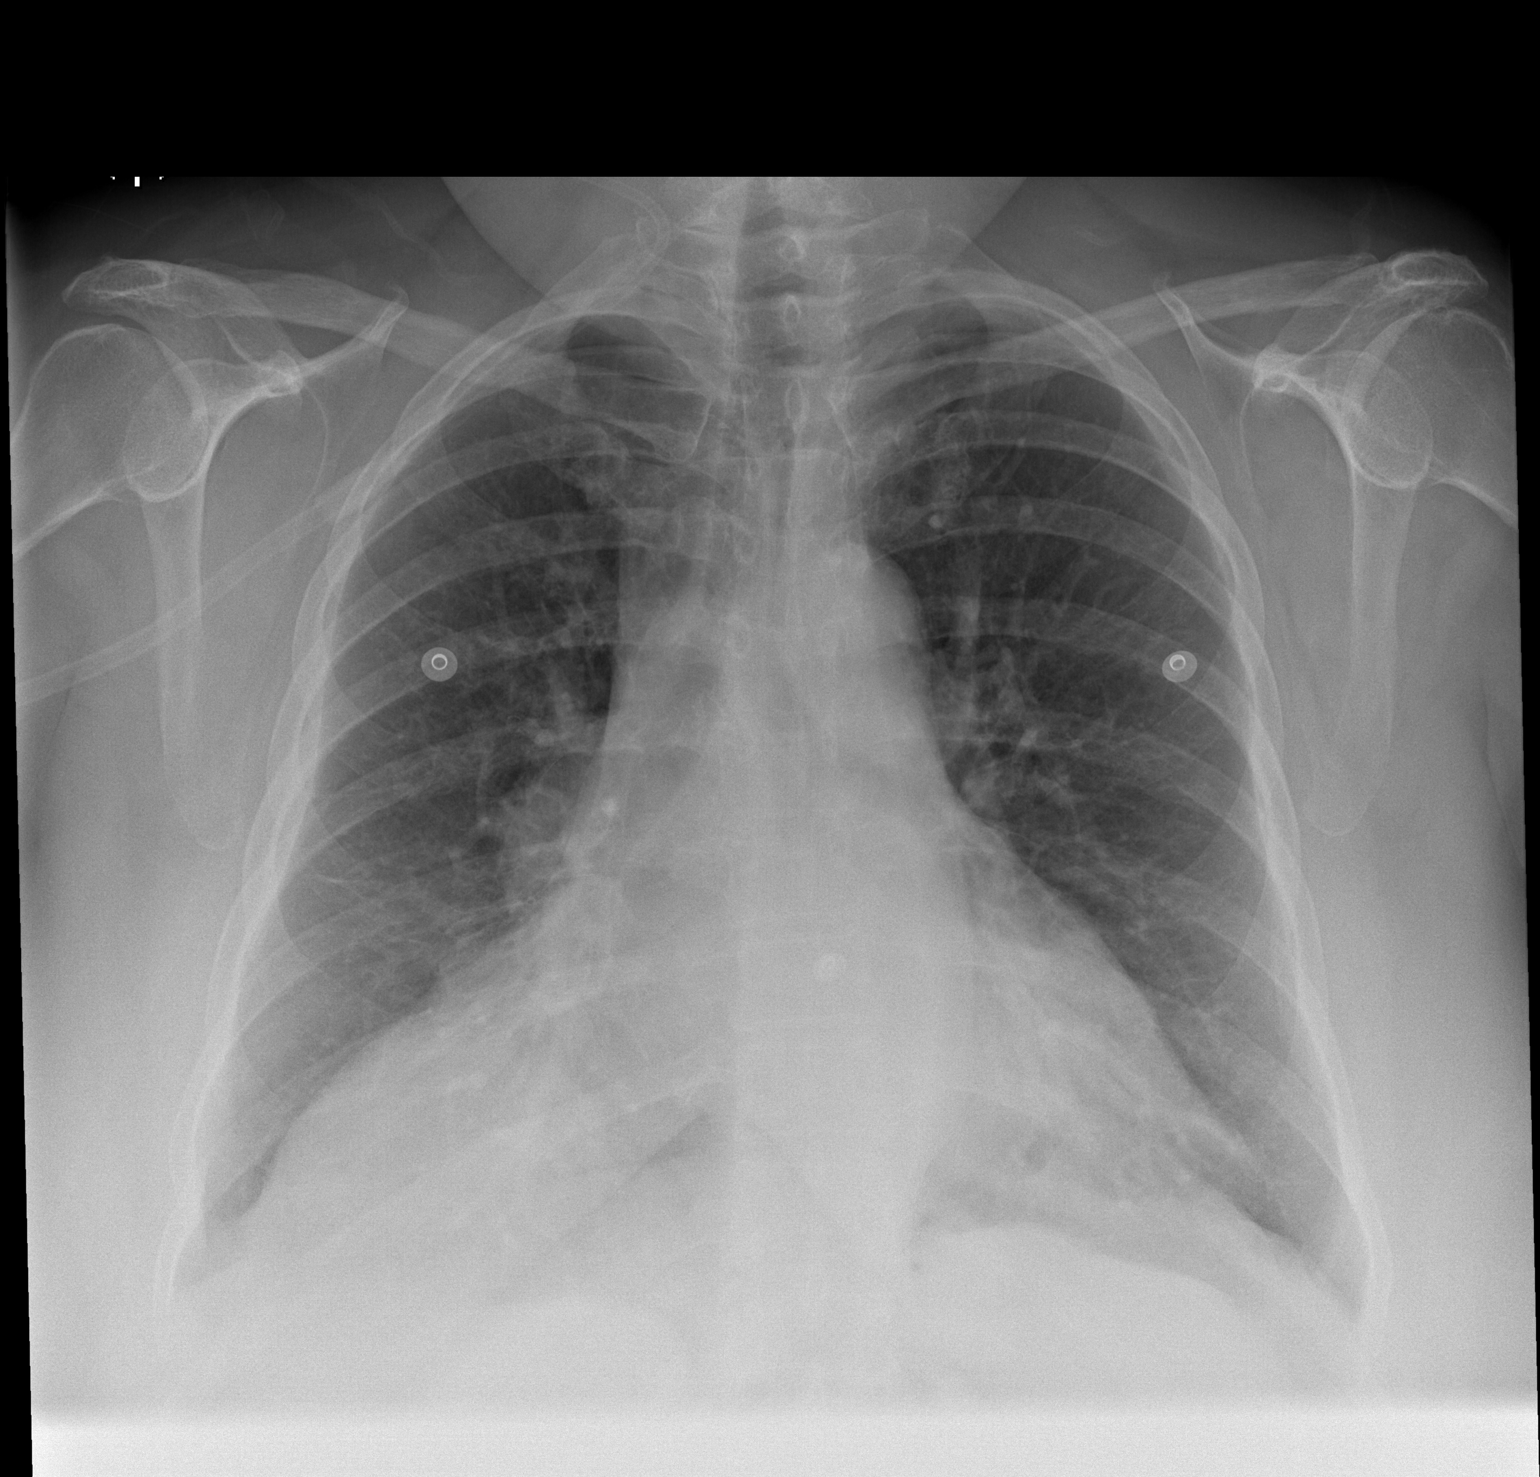
[im 2/2]
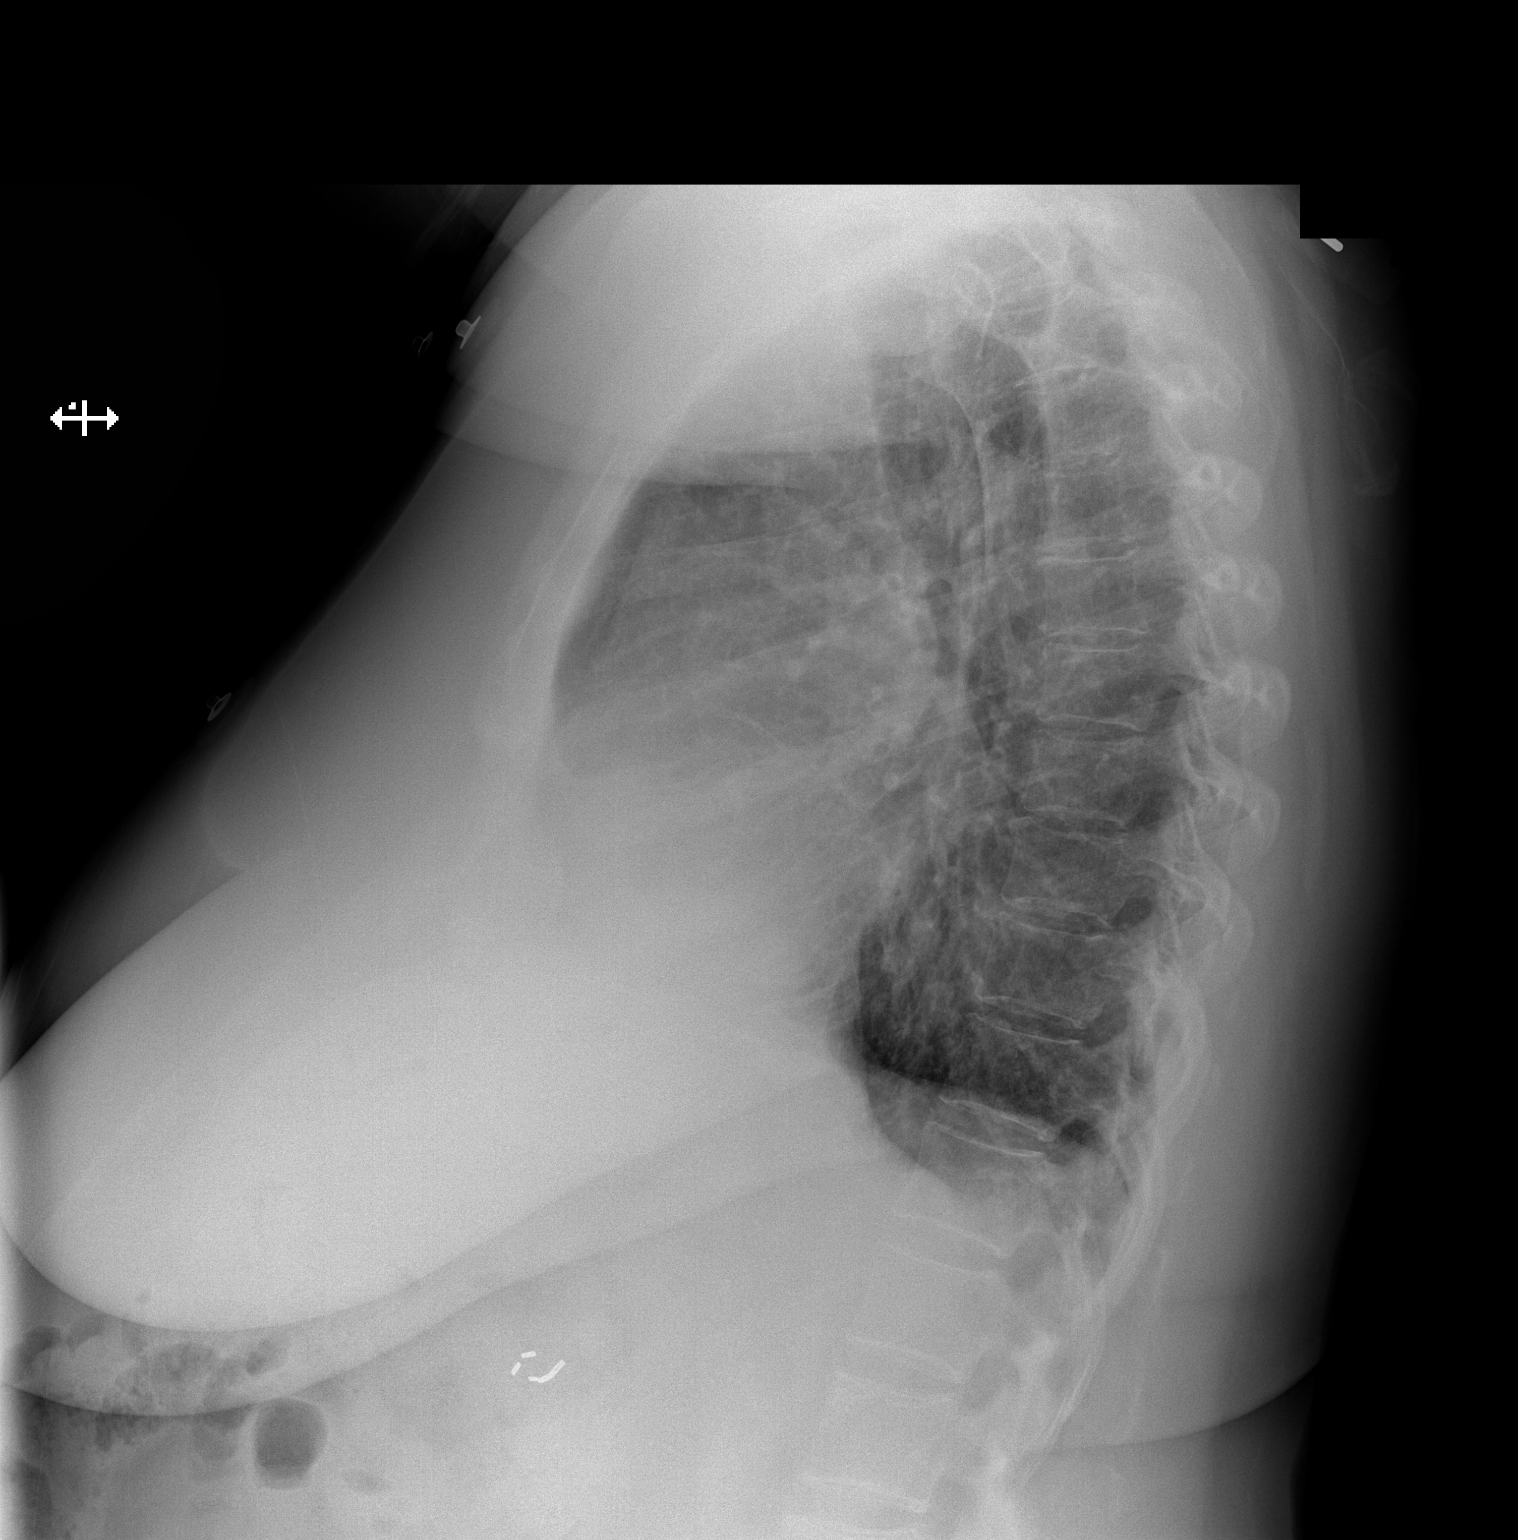

[2 of 2 positions shown; findings below may reference images not displayed]

FINDINGS: There is flattening of the hemidiaphragms. Increased density within
the right lung base is consistent with a prominent pericardial fat
pad. The lungs are otherwise clear. Degenerative changes are
appreciated within the shoulders.
IMPRESSION: COPD no acute cardiopulmonary disease.

## 2014-04-16 LAB — CBC WITH DIFFERENTIAL/PLATELET
BASOS ABS: 0 10*3/uL (ref 0.0–0.1)
BASOS PCT: 0.2 %
EOS ABS: 0 10*3/uL (ref 0.0–0.7)
EOS PCT: 0.2 %
HCT: 37.1 % (ref 35.0–47.0)
HGB: 12.4 g/dL (ref 12.0–16.0)
Lymphocyte #: 0.5 10*3/uL — ABNORMAL LOW (ref 1.0–3.6)
Lymphocyte %: 7 %
MCH: 30.2 pg (ref 26.0–34.0)
MCHC: 33.4 g/dL (ref 32.0–36.0)
MCV: 90 fL (ref 80–100)
Monocyte #: 0.1 x10 3/mm — ABNORMAL LOW (ref 0.2–0.9)
Monocyte %: 1.7 %
NEUTROS ABS: 5.9 10*3/uL (ref 1.4–6.5)
Neutrophil %: 90.9 %
Platelet: 164 10*3/uL (ref 150–440)
RBC: 4.1 10*6/uL (ref 3.80–5.20)
RDW: 12.8 % (ref 11.5–14.5)
WBC: 6.5 10*3/uL (ref 3.6–11.0)

## 2014-04-17 LAB — CREATININE, SERUM
Creatinine: 1.01 mg/dL (ref 0.60–1.30)
EGFR (African American): >60
EGFR (Non-African Amer.): 58 — ABNORMAL LOW

## 2014-04-17 IMAGING — CR DG CHEST 2V
1 series · 2 of 2 positions shown · non-contrast
Comparison: none

CLINICAL DATA: COPD

EXAM:
CHEST - 2 VIEW

[Series 3: w chest pa · 0.14mm/px · 2 of 2 slices shown]
[im 1/2]
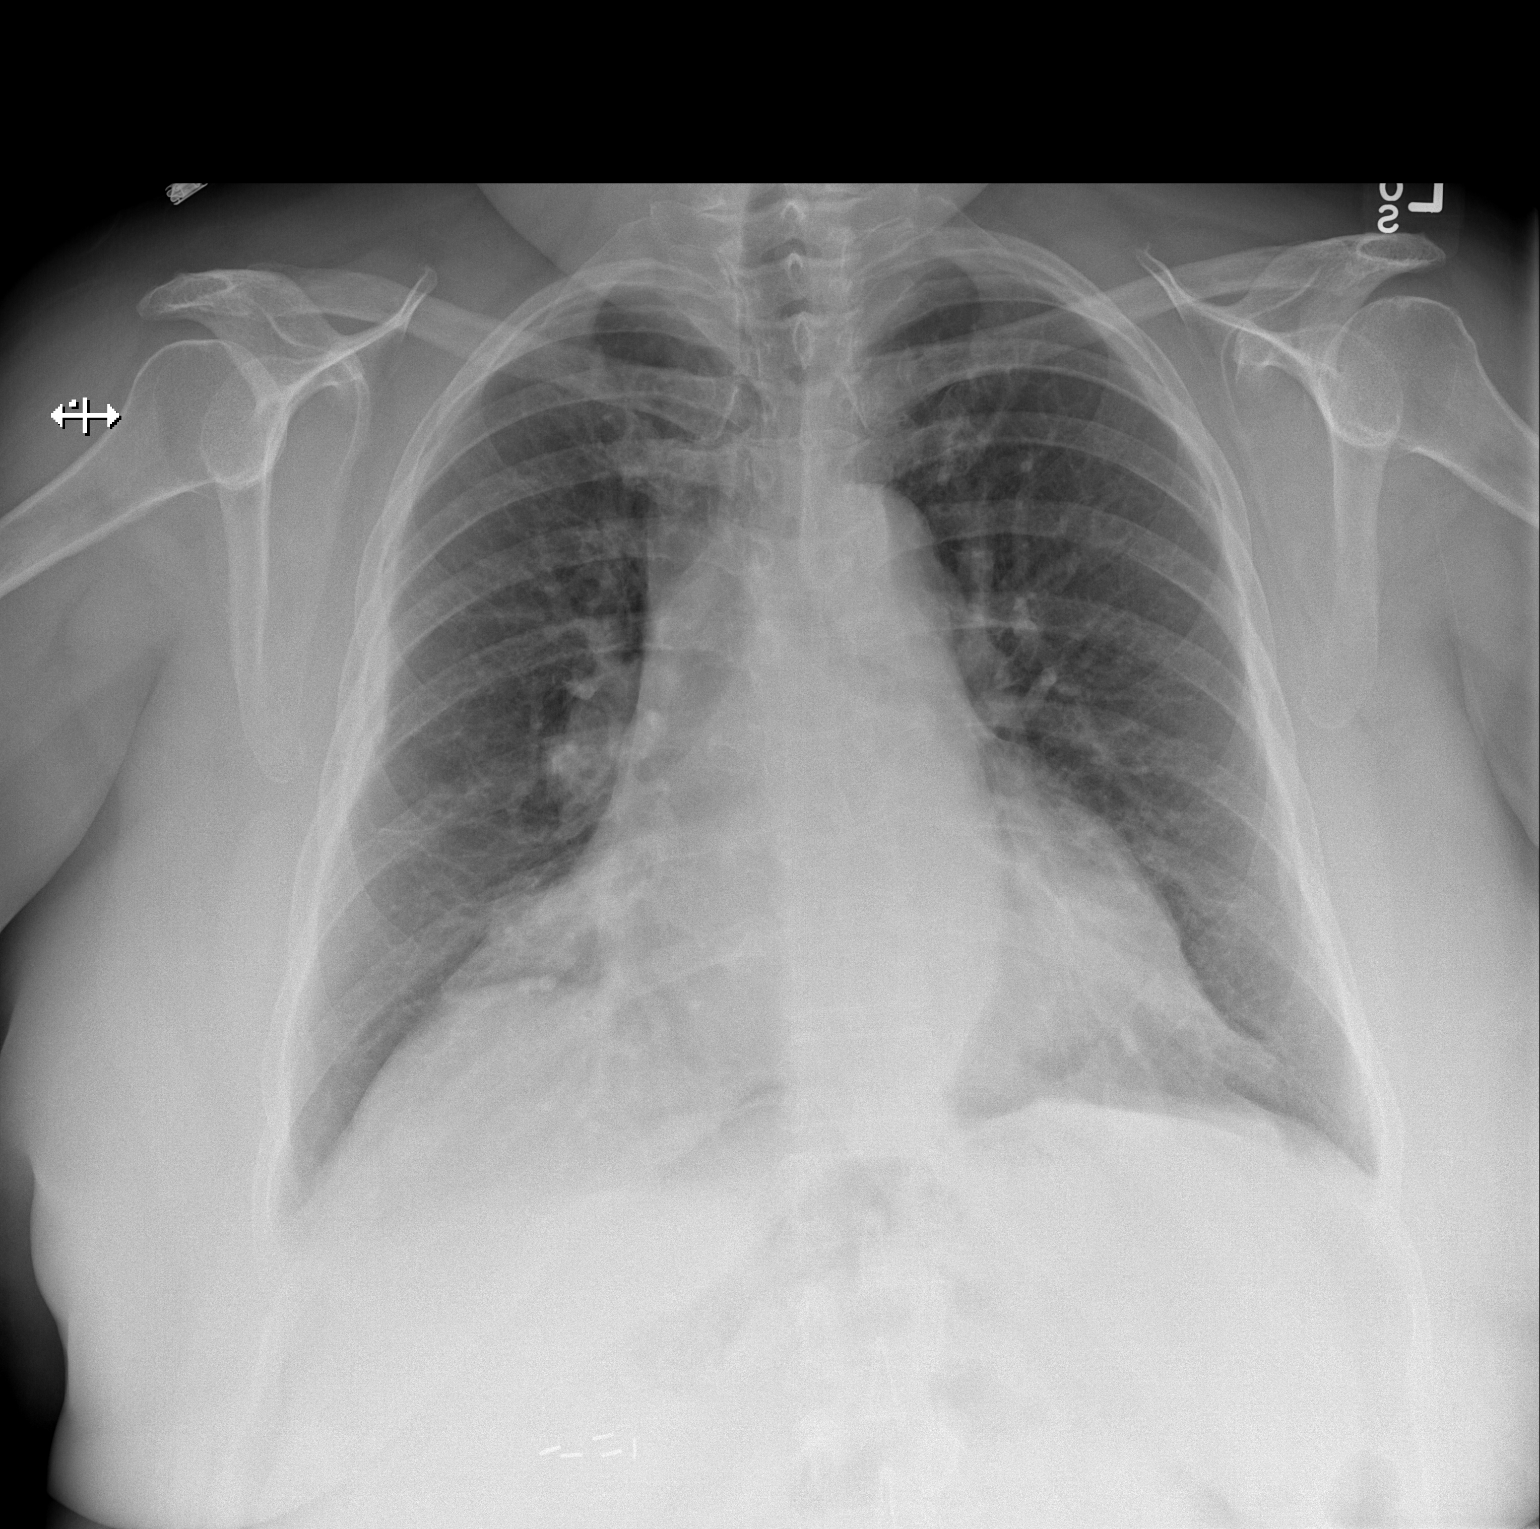
[im 2/2]
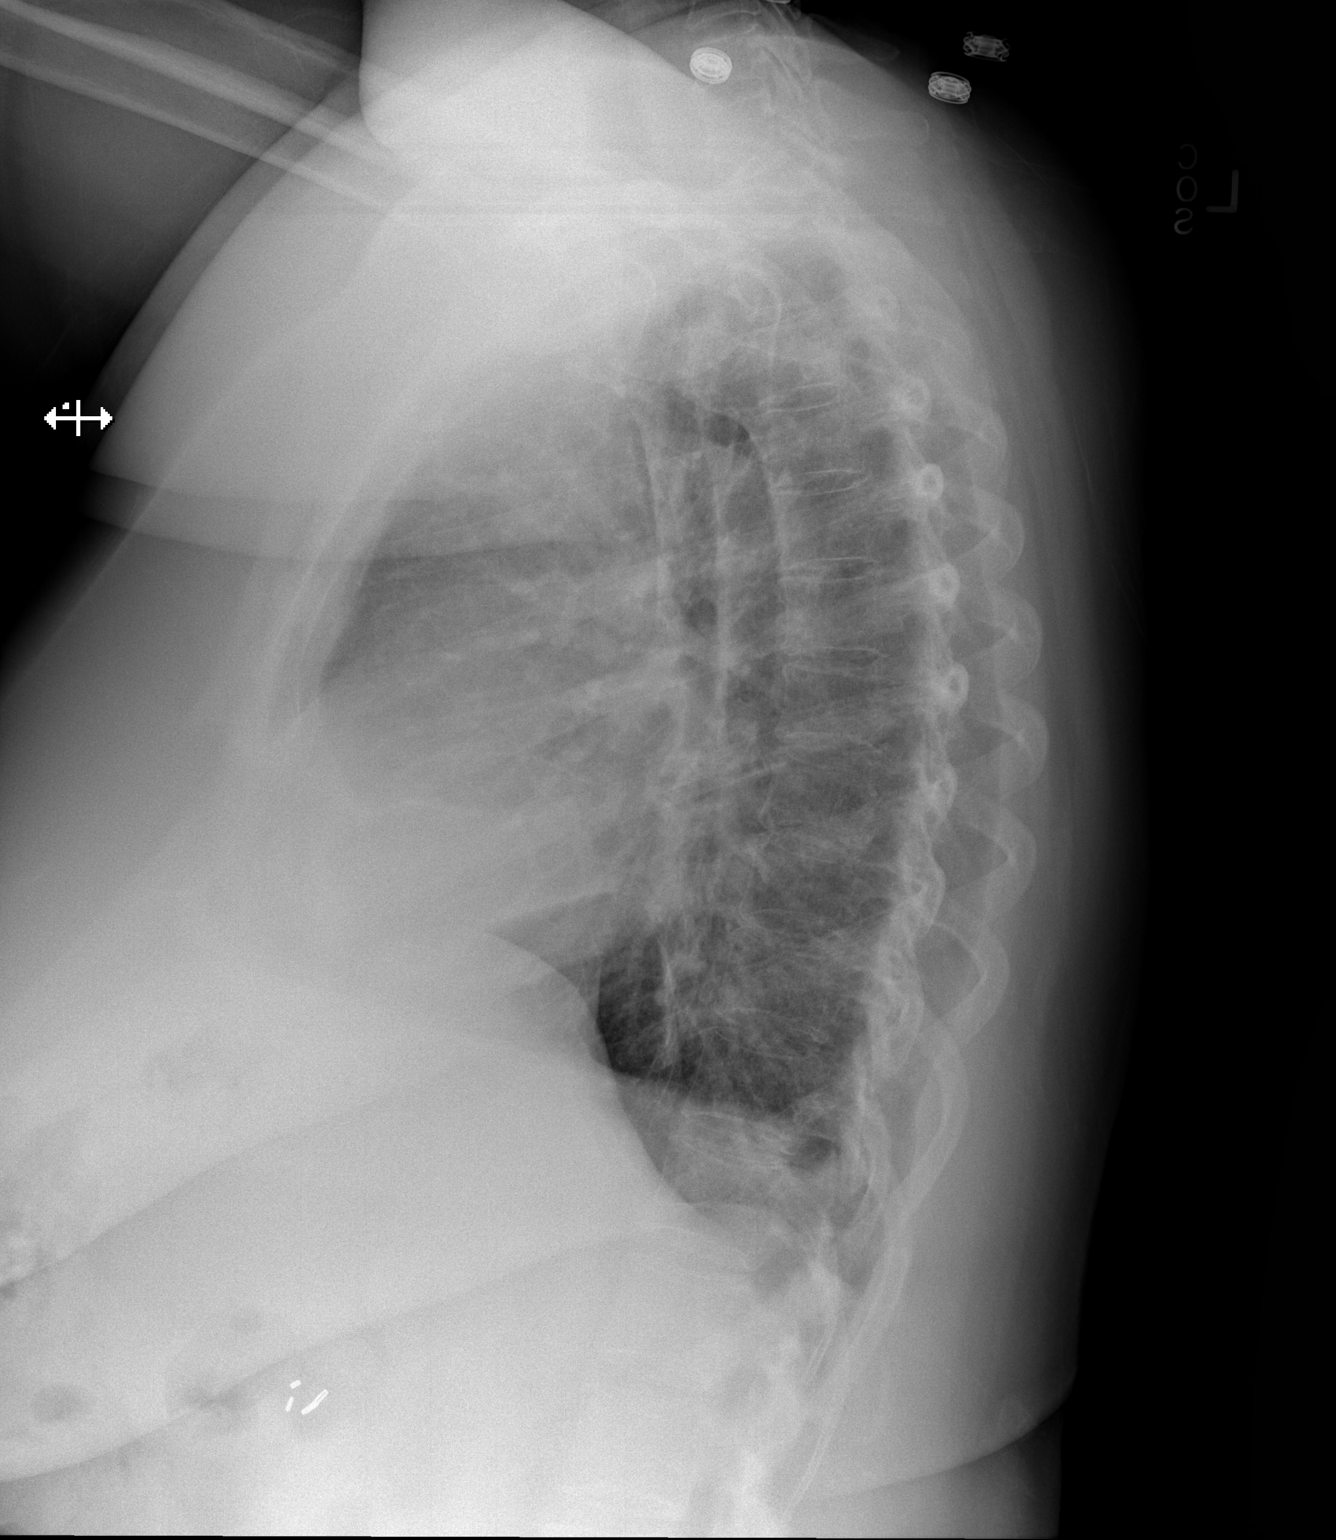

[2 of 2 positions shown; findings below may reference images not displayed]

FINDINGS: Moderate cardiomegaly. Stable density at the right pericardial
phrenic angle. Stable bibasilar scar versus linear atelectasis.
Upper lungs clear. No pleural effusion. No pneumothorax.
IMPRESSION: No active cardiopulmonary disease.  Cardiomegaly without edema.

## 2014-04-21 LAB — CREATININE, SERUM
Creatinine: 1.33 mg/dL — ABNORMAL HIGH (ref 0.60–1.30)
EGFR (African American): 48 — ABNORMAL LOW
EGFR (Non-African Amer.): 41 — ABNORMAL LOW

## 2014-04-22 LAB — BASIC METABOLIC PANEL
Anion Gap: 4 — ABNORMAL LOW (ref 7–16)
BUN: 22 mg/dL — AB (ref 7–18)
CO2: 36 mmol/L — AB (ref 21–32)
Calcium, Total: 8.4 mg/dL — ABNORMAL LOW (ref 8.5–10.1)
Chloride: 95 mmol/L — ABNORMAL LOW (ref 98–107)
Creatinine: 1.2 mg/dL (ref 0.60–1.30)
EGFR (African American): 54 — ABNORMAL LOW
EGFR (Non-African Amer.): 47 — ABNORMAL LOW
Glucose: 173 mg/dL — ABNORMAL HIGH (ref 65–99)
Osmolality: 278 (ref 275–301)
Potassium: 3.4 mmol/L — ABNORMAL LOW (ref 3.5–5.1)
Sodium: 135 mmol/L — ABNORMAL LOW (ref 136–145)

## 2014-04-22 LAB — CBC WITH DIFFERENTIAL/PLATELET
Bands: 3 %
COMMENT - H1-COM2: NORMAL
Comment - H1-Com1: NORMAL
HCT: 41.6 % (ref 35.0–47.0)
HGB: 13.7 g/dL (ref 12.0–16.0)
Lymphocytes: 10 %
MCH: 29.8 pg (ref 26.0–34.0)
MCHC: 32.8 g/dL (ref 32.0–36.0)
MCV: 91 fL (ref 80–100)
Monocytes: 9 %
Platelet: 187 10*3/uL (ref 150–440)
RBC: 4.59 10*6/uL (ref 3.80–5.20)
RDW: 12.9 % (ref 11.5–14.5)
Segmented Neutrophils: 78 %
WBC: 13.8 10*3/uL — ABNORMAL HIGH (ref 3.6–11.0)

## 2014-08-11 ENCOUNTER — Inpatient Hospital Stay: Payer: Self-pay | Admitting: Internal Medicine

## 2014-08-11 LAB — COMPREHENSIVE METABOLIC PANEL
Albumin: 3.5 g/dL (ref 3.4–5.0)
Alkaline Phosphatase: 96 U/L
Anion Gap: 8 (ref 7–16)
BUN: 22 mg/dL — ABNORMAL HIGH (ref 7–18)
Bilirubin,Total: 0.7 mg/dL (ref 0.2–1.0)
Calcium, Total: 9 mg/dL (ref 8.5–10.1)
Chloride: 94 mmol/L — ABNORMAL LOW (ref 98–107)
Co2: 28 mmol/L (ref 21–32)
Creatinine: 1.92 mg/dL — ABNORMAL HIGH (ref 0.60–1.30)
EGFR (African American): 30 — ABNORMAL LOW
EGFR (Non-African Amer.): 26 — ABNORMAL LOW
Glucose: 116 mg/dL — ABNORMAL HIGH (ref 65–99)
Osmolality: 265 (ref 275–301)
Potassium: 3.4 mmol/L — ABNORMAL LOW (ref 3.5–5.1)
SGOT(AST): 17 U/L (ref 15–37)
SGPT (ALT): 17 U/L
Sodium: 130 mmol/L — ABNORMAL LOW (ref 136–145)
Total Protein: 7.1 g/dL (ref 6.4–8.2)

## 2014-08-11 LAB — CBC WITH DIFFERENTIAL/PLATELET
Basophil #: 0.1 10*3/uL (ref 0.0–0.1)
Basophil %: 0.7 %
Eosinophil #: 0.5 10*3/uL (ref 0.0–0.7)
Eosinophil %: 4.7 %
HCT: 42.2 % (ref 35.0–47.0)
HGB: 13.7 g/dL (ref 12.0–16.0)
Lymphocyte #: 2.4 10*3/uL (ref 1.0–3.6)
Lymphocyte %: 21.2 %
MCH: 29.9 pg (ref 26.0–34.0)
MCHC: 32.5 g/dL (ref 32.0–36.0)
MCV: 92 fL (ref 80–100)
Monocyte #: 1.2 x10 3/mm — ABNORMAL HIGH (ref 0.2–0.9)
Monocyte %: 10.7 %
Neutrophil #: 7.2 10*3/uL — ABNORMAL HIGH (ref 1.4–6.5)
Neutrophil %: 62.7 %
Platelet: 202 10*3/uL (ref 150–440)
RBC: 4.59 10*6/uL (ref 3.80–5.20)
RDW: 13.1 % (ref 11.5–14.5)
WBC: 11.5 10*3/uL — ABNORMAL HIGH (ref 3.6–11.0)

## 2014-08-11 LAB — URINALYSIS, COMPLETE
Bacteria: NONE SEEN
Bilirubin,UR: NEGATIVE
Blood: NEGATIVE
Glucose,UR: NEGATIVE mg/dL (ref 0–75)
Hyaline Cast: 3
Ketone: NEGATIVE
Leukocyte Esterase: NEGATIVE
Nitrite: NEGATIVE
Ph: 5 (ref 4.5–8.0)
Protein: NEGATIVE
RBC,UR: <1 /HPF (ref 0–5)
Specific Gravity: 1.011 (ref 1.003–1.030)
Squamous Epithelial: 1
WBC UR: 1 /HPF (ref 0–5)

## 2014-08-11 LAB — CK-MB: CK-MB: 1.2 ng/mL (ref 0.5–3.6)

## 2014-08-11 LAB — TROPONIN I
Troponin-I: <0.02 ng/mL
Troponin-I: <0.02 ng/mL

## 2014-08-11 LAB — PRO B NATRIURETIC PEPTIDE: B-Type Natriuretic Peptide: 123 pg/mL (ref 0–125)

## 2014-08-11 IMAGING — CR DG CHEST 1V PORT
1 series · 1 of 1 positions shown · non-contrast
Comparison: Prior chest x-ray [DATE]

CLINICAL DATA: Dyspnea, abdominal pain

EXAM:
PORTABLE CHEST - 1 VIEW

[ap]
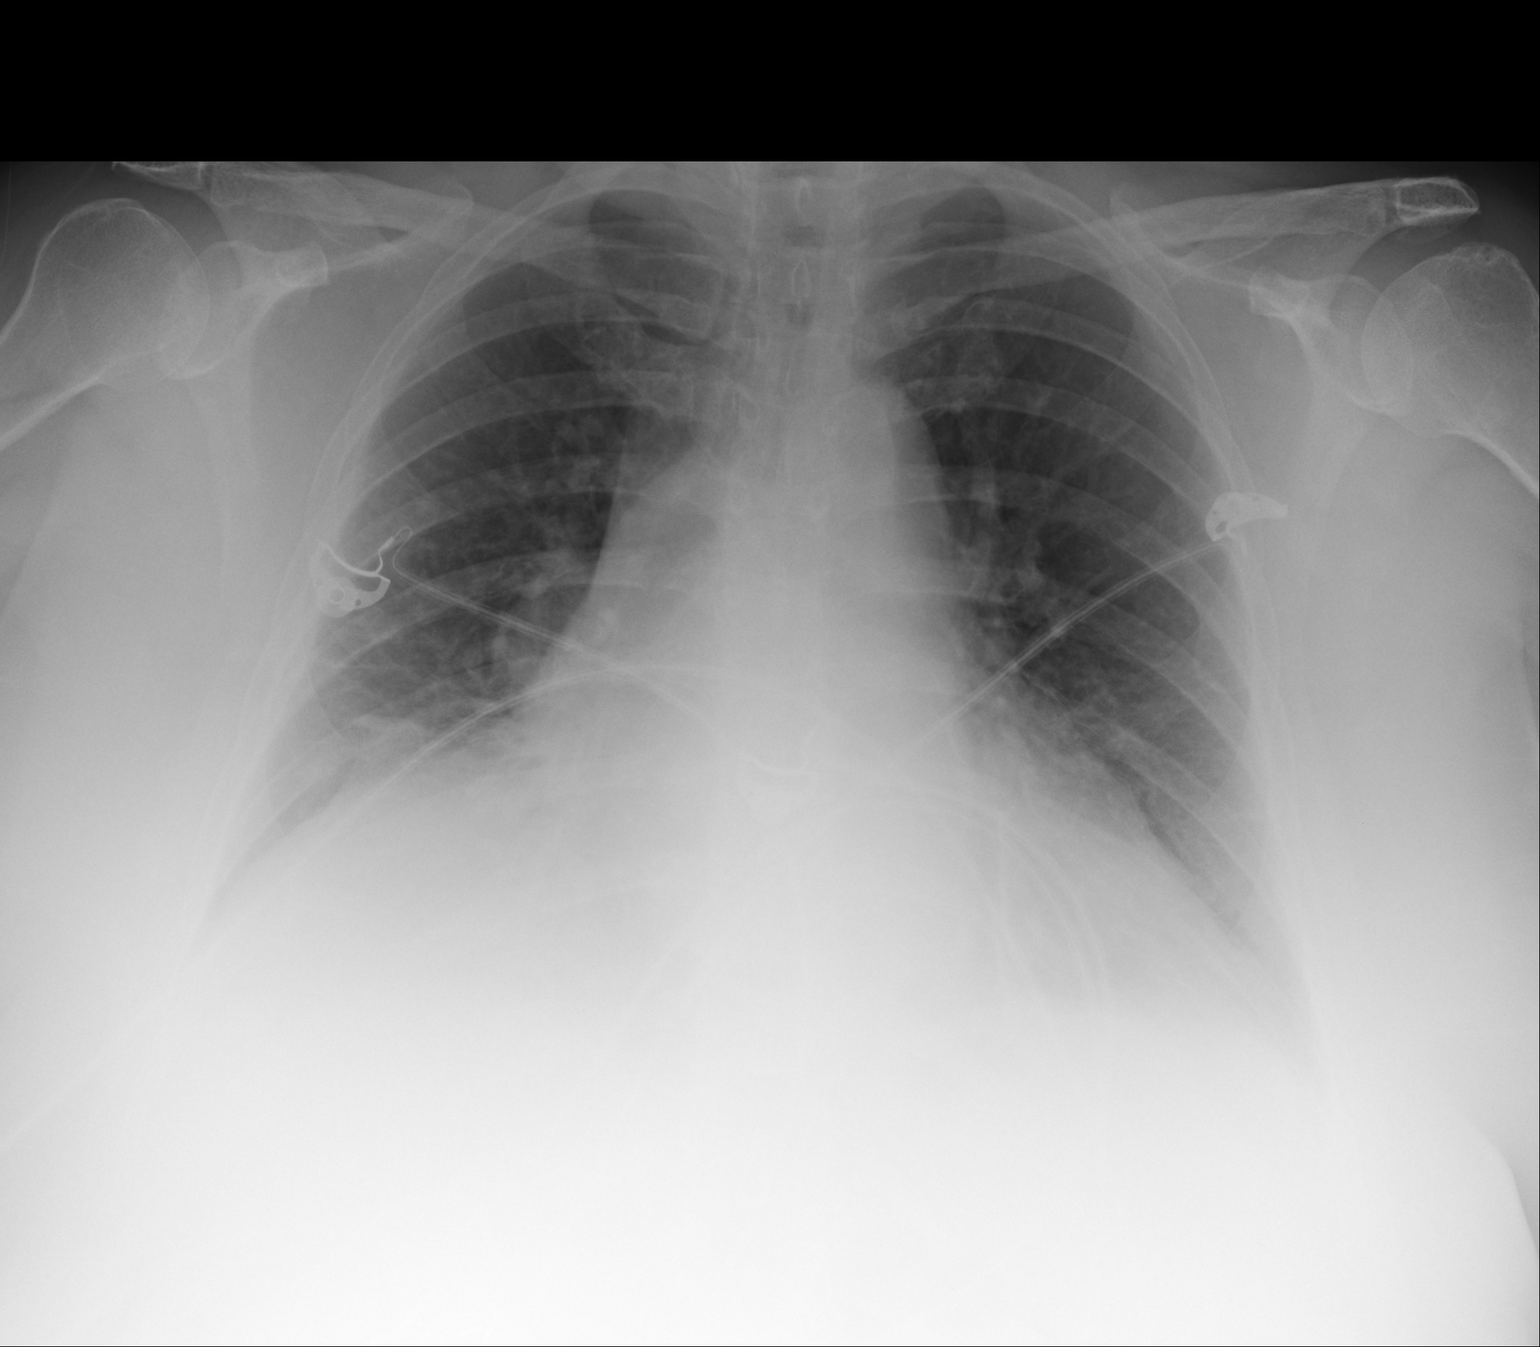

[1 of 1 positions shown; findings below may reference images not displayed]

FINDINGS: Stable cardiomegaly. Chronic elevation of the right hemidiaphragm.
Mild pulmonary vascular congestion without overt edema. Stable
nonspecific bibasilar opacities likely atelectasis. No focal
airspace consolidation, pleural effusion or pneumothorax. No acute
osseous abnormality.
IMPRESSION: 1. No evidence of acute cardiopulmonary process.
2. Unchanged appearance of cardiomegaly and pulmonary vascular
congestion without overt edema in addition to bibasilar atelectasis
versus scarring.

## 2014-08-12 LAB — TROPONIN I

## 2014-08-12 LAB — CK-MB
CK-MB: 1.5 ng/mL (ref 0.5–3.6)
CK-MB: 1.6 ng/mL (ref 0.5–3.6)

## 2014-08-12 LAB — TSH: THYROID STIMULATING HORM: 0.297 u[IU]/mL — AB

## 2014-08-13 LAB — BASIC METABOLIC PANEL
ANION GAP: 10 (ref 7–16)
BUN: 23 mg/dL — ABNORMAL HIGH (ref 7–18)
CHLORIDE: 102 mmol/L (ref 98–107)
CO2: 25 mmol/L (ref 21–32)
CREATININE: 1.16 mg/dL (ref 0.60–1.30)
Calcium, Total: 8.6 mg/dL (ref 8.5–10.1)
EGFR (Non-African Amer.): 48 — ABNORMAL LOW
GFR CALC AF AMER: 56 — AB
GLUCOSE: 192 mg/dL — AB (ref 65–99)
Osmolality: 283 (ref 275–301)
POTASSIUM: 4 mmol/L (ref 3.5–5.1)
SODIUM: 137 mmol/L (ref 136–145)

## 2015-02-18 ENCOUNTER — Ambulatory Visit: Payer: Self-pay | Admitting: Internal Medicine

## 2015-02-18 IMAGING — MG MM DIGITAL SCREENING BILAT W/ CAD
8 of 9 series · 8 of 9 positions shown · non-contrast
Comparison: Previous exam(s).

ACR Breast Density Category a: The breast tissue is almost entirely
fatty.

CLINICAL DATA: Screening.

EXAM:
DIGITAL SCREENING BILATERAL MAMMOGRAM WITH CAD

[R CC (1 of 2)]
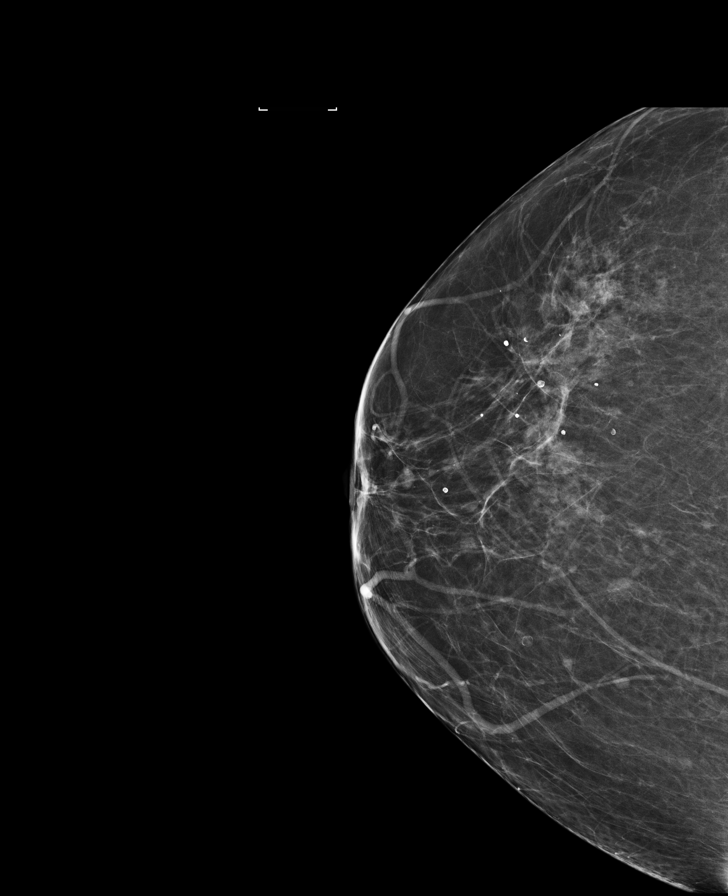

[R MLO (1 of 2)]
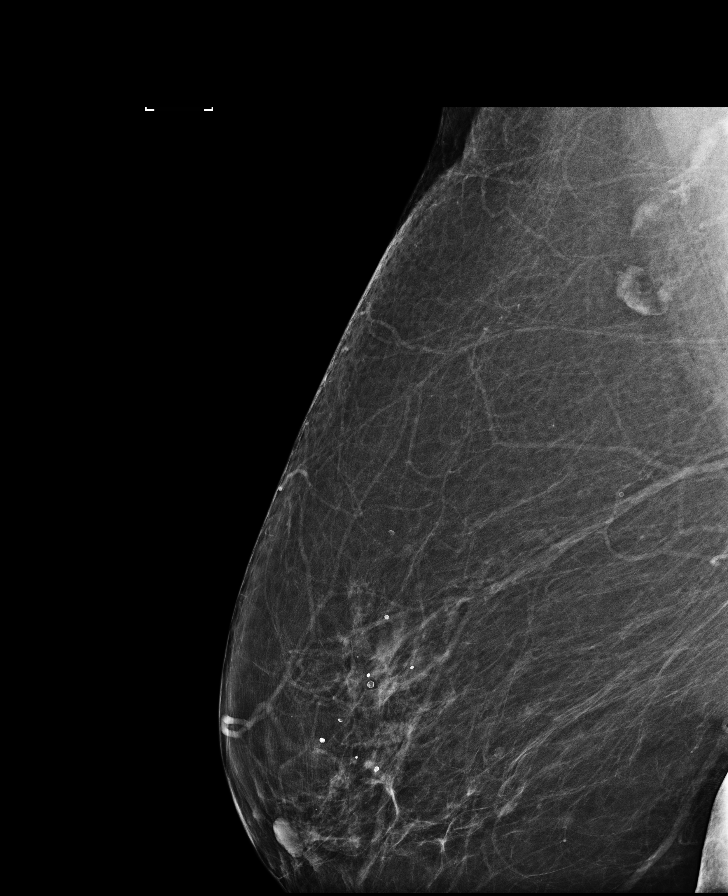

[L MLO (1 of 2)]
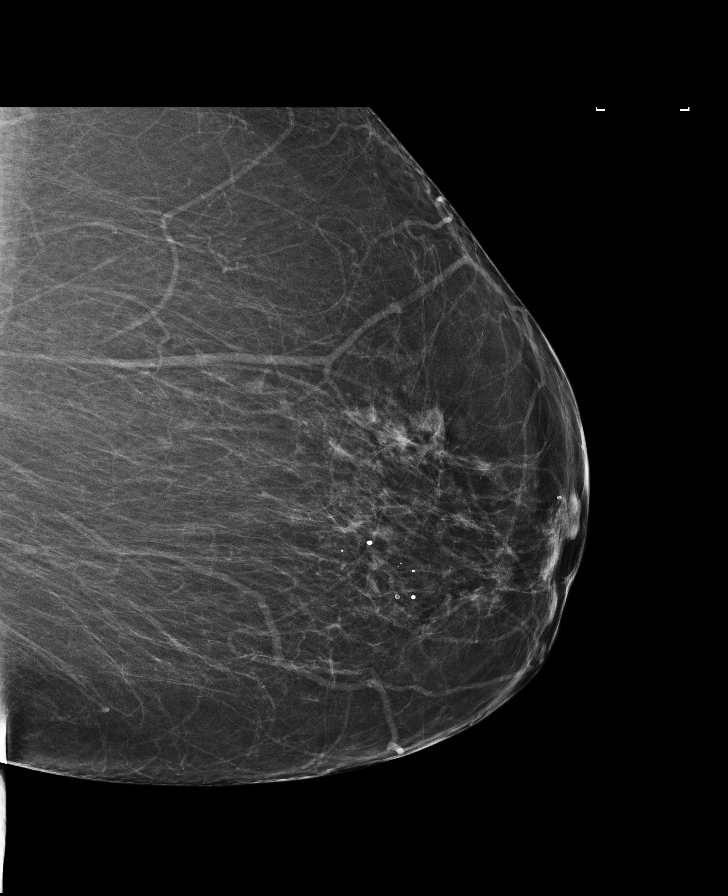

[L MLO (2 of 2)]
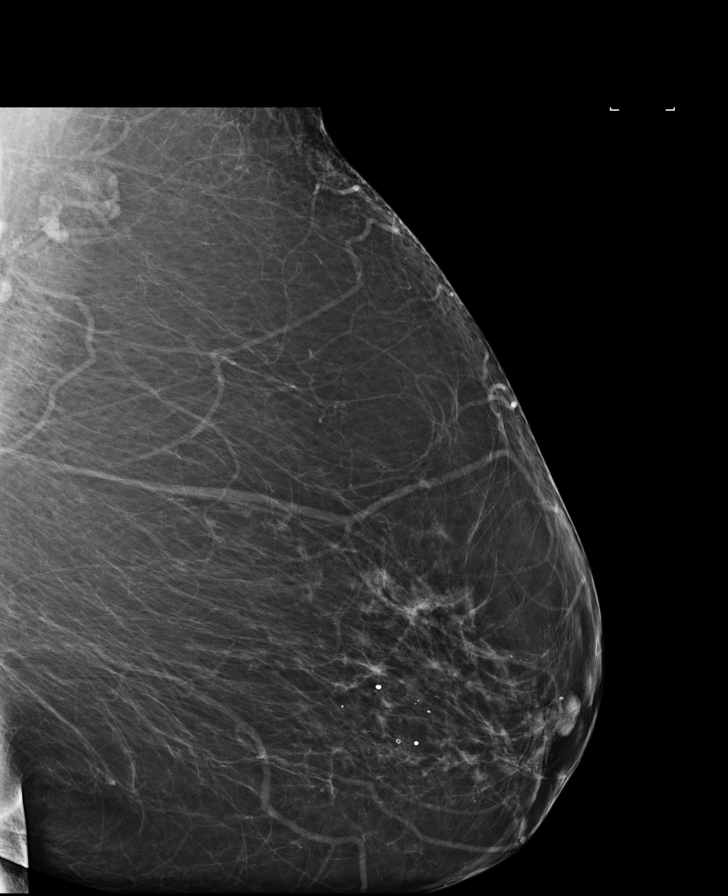

[R MLO (2 of 2)]
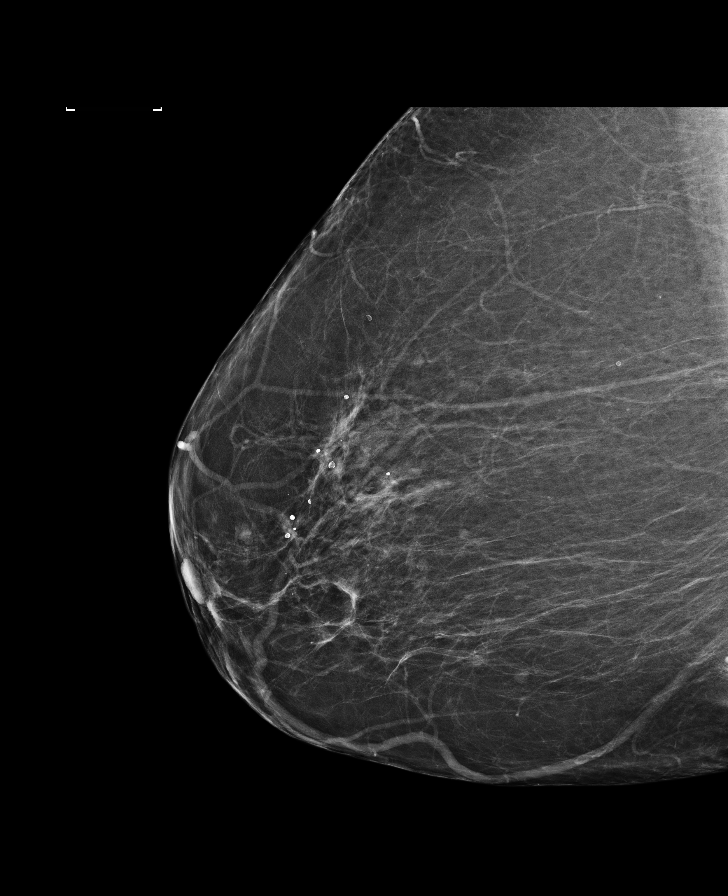

[L CC (1 of 2)]
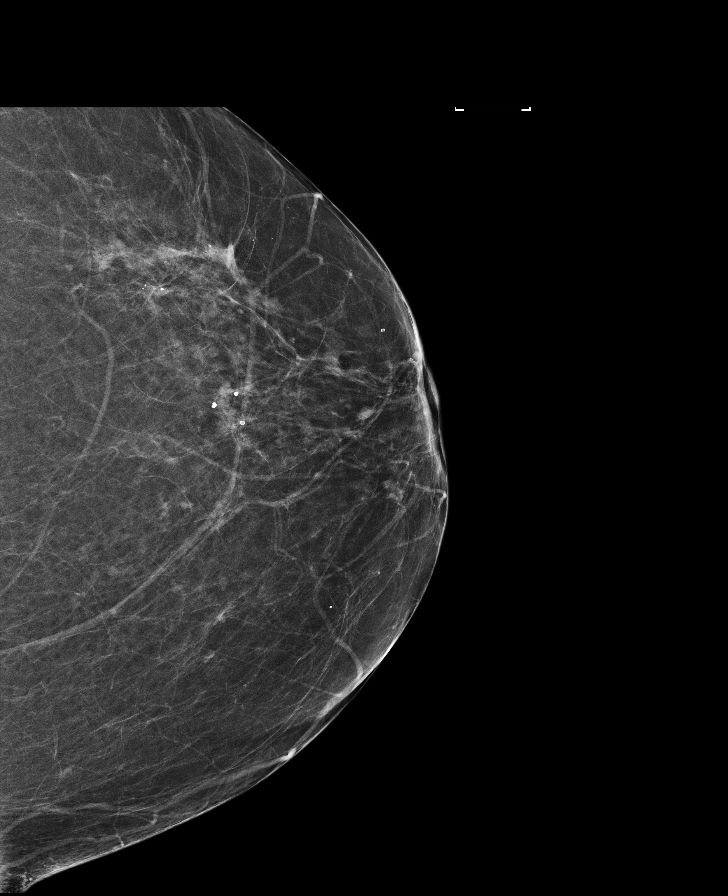

[R CC (2 of 2)]
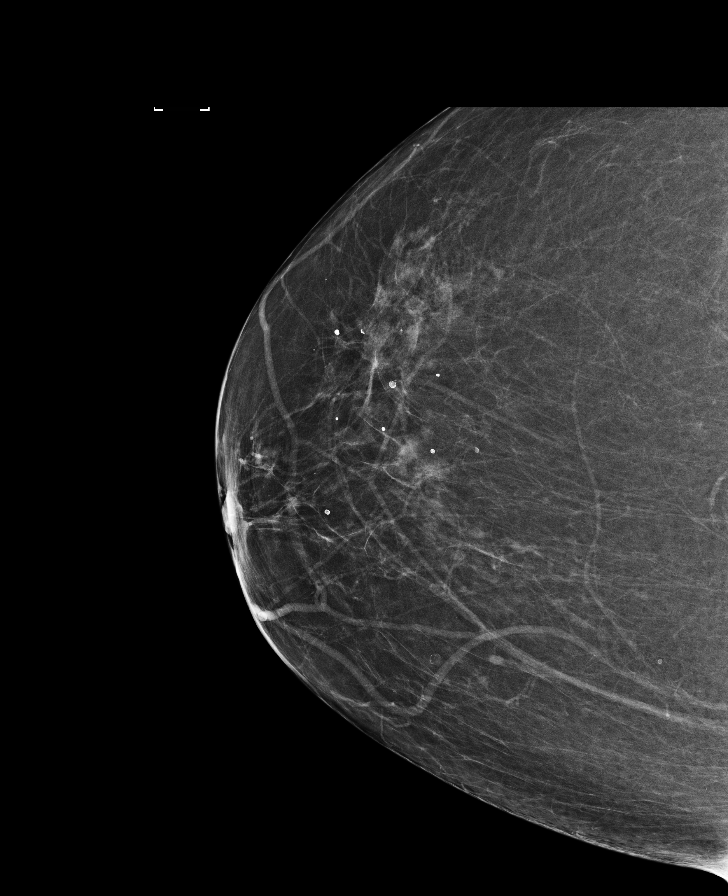

[L CC (2 of 2)]
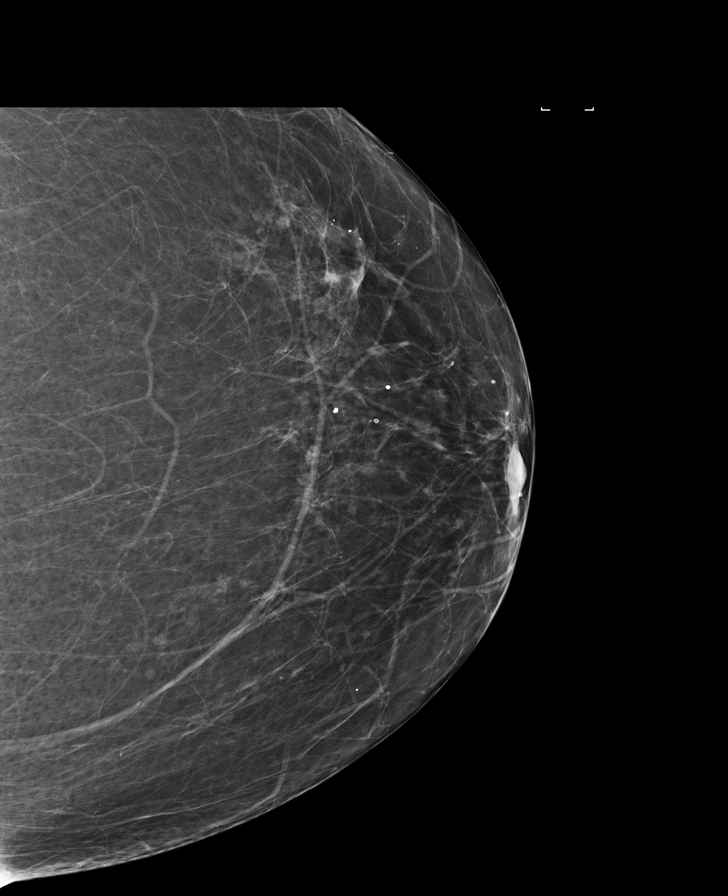

[8 of 9 positions shown; findings below may reference images not displayed]

FINDINGS: There are no findings suspicious for malignancy. Images were
processed with CAD.
IMPRESSION: No mammographic evidence of malignancy. A result letter of this
screening mammogram will be mailed directly to the patient.

RECOMMENDATION:
Screening mammogram in one year. (Code:[0V])

BI-RADS CATEGORY  1: Negative.

## 2015-04-11 NOTE — H&P (Signed)
PATIENT NAME:  Sara Mcdonald, Sara Mcdonald MR#:  [id number] DATE OF BIRTH:  1946-01-05  DATE OF ADMISSION:  04/15/2014  PRIMARY CARE PHYSICIAN: Dr. Emily Filbert.   CHIEF COMPLAINT: Cough, shortness of breath.   HISTORY OF PRESENT ILLNESS: A 69 year old, morbidly obese, Caucasian female patient with history of hypertension, migraines, pleurisy on chronic steroids, who presents to the Emergency Room complaining of on-and-off shortness of breath and cough for a month. The patient initially called her doctor's office and was given a prescription for doxycycline. This seemed to improve, but then she started to worsen and called again, got Z-Pak, but this started worsening again over the past few days and the patient presents to the Emergency Room here. The patient has been found to have severe wheezing, decreased air entry and tachycardia into the 130s, although her saturations have been greater than 90% on room air. Her temperature was 100.2.   She does not complain of any PND, orthopnea or edema. She does have green productive sputum. No hemoptysis. No chest pain.   The patient had a PFT done in 2008 with Dr. Raul Del, which did not show COPD, but showed a possible early interstitial lung disease. She presently follows up at [facility] for pleurisy, which she describes as pain in the right lower chest and has been on prednisone for 8 years. She follows up with rheumatology at [facility].   She is not aware of any interstitial lung disease or COPD diagnosis.   PAST MEDICAL HISTORY:  1.  Hypertension.  2.  Hyperlipidemia.  3.  GERD.  4.  Chronic constipation.  5.  Fibromyalgia.  6.  Migraines.  7.  Pleurisy.   PAST SURGICAL HISTORY: Appendectomy, hysterectomy and cholecystectomy.   ALLERGIES: PENICILLIN AND SULFA.   SOCIAL HISTORY: The patient does not smoke. No second hand smoke exposure. She used to work at [employer] and has retired. Lives at home with her husband.   FAMILY HISTORY: CAD in her mother. Her  father and brother had COPD, but died of coronary artery disease and acute MI.   REVIEW OF SYSTEMS: CONSTITUTIONAL: Has fever, fatigue and weakness. EYES: No blurred vision, pain, redness. ENT: No tinnitus, ear pain or hearing loss. RESPIRATORY: Has cough, greenish sputum, wheezing. CARDIOVASCULAR: No chest pain, orthopnea, edema.  GASTROINTESTINAL: No nausea, vomiting, diarrhea, abdominal pain. GENITOURINARY: No dysuria, hematuria, frequency. ENDOCRINE: No polyuria, nocturia, thyroid problems. HEMATOLOGIC AND LYMPHATIC: No anemia, easy bruising, bleeding. INTEGUMENTARY: No acne, rash, lesion. MUSCULOSKELETAL: No back pain, arthritis.  NEUROLOGICAL: No focal numbness, weakness, seizures. PSYCHIATRIC: No anxiety or depression. Does have fibromyalgia.   HOME MEDICATIONS:  1.  Alprazolam 0.5 mg oral 2 times a day as needed for anxiety.  2.  Ambien 10 mg oral at bedtime.  3.  Colcrys 0.6 mg daily.  4.  Cymbalta 60 mg day.  5.  Lisinopril 10 mg daily.  6.  Lyrica 100 mg 3 times a day. 7.  Maxalt 10 mg oral 2 times a day as needed for migraines.  8.  Maxzide 25/37.5, 1 tablet oral daily.  9.  Pravachol 80 mg daily.  10. Promethazine 25 mg oral 2 times a day as needed for nausea or vomiting.  11. Protonix 40 mg 2 times a day.  12. Tylenol with Codeine 300/30 mg 2 times a day as needed for pain.   PHYSICAL EXAMINATION:  VITAL SIGNS: Temperature 100.2, pulse of 126, saturating 92% on room air with blood pressure 160/70.  GENERAL: Morbidly obese, Caucasian female patient sitting  up in bed in mild respiratory distress.  PSYCHIATRIC: Alert and oriented x 3. Mood and affect appropriate. Judgment intact.  HEENT: Normocephalic, atraumatic. Mucous membranes moist and pink. External ears and nose normal. No pallor. No icterus. Pupils bilaterally equal and reactive to light.  NECK: Supple. No thyromegaly or palpable lymph nodes. Trachea midline. No carotid bruits or JVD.  CARDIOVASCULAR: S1, S2, tachycardic  without any murmurs. Peripheral pulses 2+. No edema.  RESPIRATORY: Decreased air entry with bilateral wheezing and increased work of breathing.  GASTROINTESTINAL: Soft abdomen, nontender. Bowel sounds present. No hepatosplenomegaly palpable.  SKIN: Warm and dry. No petechiae, rash, ulcers.  MUSCULOSKELETAL: No joint swelling, redness of the large joints. Normal muscle tone.  NEUROLOGICAL: Motor strength 5 out of 5 in upper and lower extremities. Sensation to fine touch intact all over.  LYMPHATICS: No cervical lymphadenopathy. \  LABORATORY STUDIES: Show BNP of 62, glucose of 133, BUN 11, creatinine 1.12, sodium 134, potassium 3.6, chloride 97. GFR 51. AST, ALT, alkaline phosphatase, bilirubin normal. Troponin less than 0.02. WBC 9.5, hemoglobin 14.7, platelets of 183. Chest x-ray shows no acute abnormalities. CT scan of the chest for pulmonary margin showed no pulmonary embolism. It did show minimal basilar atelectasis. No infiltrates or effusion. EKG showed sinus tachycardia with inferior Q waves. No acute ST elevation.   ASSESSMENT AND PLAN:  1.  Acute bronchitis. The patient who has used doxycycline and azithromycin recently, without any significant improvement, has sinus tachycardia, fever and severe wheezing at this time. We will admit the patient for acute bronchitis and sepsis as inpatient, start on high dose IV steroids, scheduled nebulizers and antibiotics with IV Levaquin. Send for sputum culture. The patient likely has underlying interstitial lung disease considering her pulmonary function test and the fact that she goes to [facility] to see rheumatology and has diagnosis of pleurisy on chronic steroids. Will need an outpatient repeat pulmonary function test once better. Presently, continue acute inpatient care.  2.  Hypertension, uncontrolled. Blood pressure is up in the 180s to 160s. Will continue home medications and use IV p.r.n. medications as needed, likely elevated secondary to acute anxiety  from shortness of breath and being in the Emergency Room.  3.  Fibromyalgia. Continue medications.  4.  Deep vein thrombosis prophylaxis with Lovenox.   CODE STATUS: Full code.   TIME SPENT TODAY ON THIS CASE: 45 minutes.   ____________________________ Leia Alf Charlesia Canaday, MD srs:aw D: 04/15/2014 13:45:02 ET T: 04/15/2014 14:35:14 ET JOB#: [id number]  cc: Alveta Heimlich R. Darvin Neighbours, MD, <Dictator> [name], MD Neita Carp MD ELECTRONICALLY SIGNED 04/15/2014 17:23

## 2015-04-11 NOTE — Discharge Summary (Signed)
PATIENT NAME:  Sara Mcdonald, Sara Mcdonald MR#:  [id number] DATE OF BIRTH:  30-Aug-1946  DATE OF ADMISSION:  04/15/2014 DATE OF DISCHARGE:  04/22/2014  DISCHARGE DIAGNOSES: 1.  Systemic inflammatory response syndrome with severe asthmatic bronchitis and status asthmaticus.  2.  Hypertension.  3.  Hyperlipidemia.  4.  Fibromyalgia.  5.  Chronic pleurisy.  6.  Migraine headaches.   DISCHARGE MEDICATIONS:  Levaquin 500 mg daily x 5 days, prednisone taper, Xanax 0.5 mg b.i.d. p.r.n., Ambien 10 mg at bedtime, Dyazide 1 daily, Pravachol 80 mg at bedtime, Protonix 40 mg b.i.d., lisinopril 10 mg daily, Lyrica 100 mg t.i.d., Colcrys 0.6 mg daily, Cymbalta 60 mg daily, Tylenol No. 3, 1 b.i.d. p.r.n.; Maxalt 10 mg daily p.r.n., MiraLAX 17 grams daily.   REASON FOR ADMISSION:  A 69 year old female who presents with SIRS and hypoxia with severe dyspnea. Please see H and P for HPI, past medical history and physical exam.   HOSPITAL COURSE:  The patient was admitted. Chest CT did not show pulmonary embolism. Chest x-ray did not show an infiltrate. She was treated aggressively with full pulmonary toilet including IV Levaquin, IV Rocephin, SVNs and high-dose steroids. Her steroid dose was decreased and she had a profound flare with profound hypoxemia and dyspnea, and thus the IV steroids had to be tapered more slowly with success over the course of the hospitalization. She will be going home on the above medications and is close to baseline at this point. Overall prognosis is guarded. She will follow up with Dr. Sabra Heck in 1 week.   ____________________________ [name], MD mfm:dmm D: 04/22/2014 08:01:38 ET T: 04/22/2014 10:15:40 ET JOB#: [id number]  cc: [name], MD, <Dictator> MARK Roselee Culver MD ELECTRONICALLY SIGNED 04/23/2014 7:53

## 2015-04-11 NOTE — H&P (Signed)
PATIENT NAME:  Sara Mcdonald, Sara Mcdonald MR#:  [id number] DATE OF BIRTH:  1946-06-01  DATE OF ADMISSION:  08/11/2014  PRIMARY CARE PHYSICIAN: [name], MD   REFERRING PHYSICIAN: Larae Grooms, MD  CHIEF COMPLAINT: Shortness of breath and abdominal pain.   HISTORY OF PRESENT ILLNESS: Sara Mcdonald is a 69 year old, morbidly obese female with a history of obstructive sleep apnea, noncompliant with CPAP; hypertension; and hyperlipidemia who comes to the Emergency Department with the complaints of shortness of breath, gradually worsening over the few days. Has been having cough with mildly productive sputum of greenish sputum. Has been having on-and-off chills. Since yesterday, the patient also started experiencing pain on the left upper quadrant. Patient states has history of gastritis; however, the patient has more on the left lateral aspect of the abdomen. Has been tolerating diet, having regular bowel movements. Denies having any nausea. Patient's husband states this morning the patient had oxygen saturation of 91% when she woke up; however, when she stood up and walked around, oxygen saturation improved to 94%. However, by evening, patient's shortness of breath getting worse, came to the Emergency Department. Per the Emergency Department, the patient was diffusely wheezing at the time of the presentation, was given breathing treatments with some improvement. Per Emergency Department physician, the patient was hypoxic at the time of the presentation; however, currently having good oxygen saturations on 2 L of oxygen. The patient at baseline does not use any oxygen. BNP is 123. Chest x-ray shows questionable pulmonary edema, congestive heart failure.   PAST MEDICAL HISTORY:  1.  Hypertension.  2.  Hyperlipidemia.  3.  Gastroesophageal reflux disease.  4.  Morbid obesity.  5.  Fibromyalgia.  6.  Migraine headaches.  7.  Chronic constipation.   PAST SURGICAL HISTORY:  1.  Appendectomy.  2.   Hysterectomy.  3.  Cholecystectomy.   ALLERGIES: PENICILLIN AND SULFA.   HOME MEDICATIONS: 1.  Tylenol with Codeine 1 tablet 2 times a day.  2.  Protonix 40 mg 2 times a day.  3.  Prednisone 20 mg 2 tablets once a day.  4.  Pravachol 80 mg once a day.  5.  MiraLax 17 g daily.  6.  Maxzide 25/37.5 mg once a day.  7.  Maxalt 10 mg 2 times a day.  8.  Lyrica 100 mg 3 times a day.  9.  Lisinopril 10 mg once a day.  10.  Cymbalta 60 mg once a day.  11.  Colcrys 0.6 mg once a day.  12.  Ambien 10 mg once a day.  13.  Alprazolam 0.5 mg 2 times a day.   SOCIAL HISTORY: No history of smoking, drinking alcohol, or using illicit drugs. Retired from [employer]. Lives at home with her husband.   FAMILY HISTORY: Coronary artery disease in mother. Father and brother with COPD.   REVIEW OF SYSTEMS:  CONSTITUTIONAL: Experiences generalized weakness. No energy. EYES: No change in vision.  ENT: No change in hearing.   RESPIRATORY: Has cough, shortness of breath.  CARDIOVASCULAR: No chest pain, palpitations.  GASTROINTESTINAL: No nausea, vomiting. Has some abdominal pain.  GENITOURINARY: No dysuria or hematuria.  ENDOCRINE: No polyuria or polydipsia.  HEMATOLOGIC: No easy bruising or bleeding.  SKIN: No rash or lesions.  MUSCULOSKELETAL: Has fibromyalgia.  NEUROLOGIC: No weakness or numbness in any part of the body.   PHYSICAL EXAMINATION:  GENERAL: This is a morbidly obese female sitting in the bed, not in distress.  VITAL SIGNS: Temperature 98, pulse  113, blood pressure 122/70 to 71, respiratory rate of 22, oxygen saturation 93% to 97% on 2 L of oxygen.  HEENT: Head normocephalic, atraumatic. There is no scleral icterus. Conjunctivae normal. Pupils equal and react to light. Mucous membranes moist. No pharyngeal erythema.  NECK: Thick and short neck. No lymphadenopathy, JVD. No carotid bruit.  CHEST: Has no focal tenderness. Decreased breath sounds in the lower lobes. Occasional  bibasilar crackles. HEART: S1, S2, regular, tachycardia.  ABDOMEN: Bowel sounds plus. Soft. Has mild tenderness in the epigastric area. No rebound or guarding. Could not appreciate any hepatosplenomegaly secondary to the patient's body habitus.   EXTREMITIES: 1 to 2+ pitting edema extending up to the knees.  SKIN: No rash or lesions.  MUSCULOSKELETAL: Good range of motion in all the extremities.  NEUROLOGIC: The patient is alert, oriented to place, person, and time. Cranial nerves II through XII intact. Motor 5/5 in upper and lower extremities.   LABORATORY DATA: UA negative for nitrites and leukocyte esterase. Chest x-ray, 1 view, portable: No acute cardiopulmonary disease. Chest x-ray, 1 view, portable: Shows unchanged appearance of the cardiomegaly, pulmonary vascular congestion without overt edema, bibasilar atelectasis.   BMP: BUN 22, creatinine of 1.92, sodium 130, potassium 3.4. The rest of all the values are within normal limits.   CBC: WBC of 11.5. The rest of all the values are within normal limits.   Troponins less than 0.02.   ASSESSMENT AND PLAN: Sara Mcdonald is a 69 year old female who comes to the Emergency Department with cough and shortness of breath.  1.  Hypoxic respiratory failure. This could be a combination of bronchitis and possible congestive heart failure. Will treat with DuoNeb, Solu-Medrol, and antibiotics as well as also keep the patient on intravenous Lasix. The patient states had echocardiogram done 3 years back by the primary care physician.  2.  Obstructive sleep apnea. The patient is extremely noncompliant with the continuous positive airway pressure. Counseled with the patient.  3.  Obesity. Counseled with the patient regarding diet and exercise. Patient expressed understanding.  4.  Hypertension, currently well controlled. Continue the home medications.  5.  Hyponatremia secondary to hydrochlorothiazide. Will hold hydrochlorothiazide.  6.  Hypertension,  currently well controlled. Continue with the lisinopril.  7.  Keep the patient on deep vein thrombosis prophylaxis with Lovenox.   TIME SPENT: 50 minutes.    ____________________________ Monica Becton, MD pv:ST D: 08/11/2014 21:17:09 ET T: 08/11/2014 22:46:08 ET JOB#: [id number]  cc: Monica Becton, MD, <Dictator> Monica Becton MD ELECTRONICALLY SIGNED 08/13/2014 0:21

## 2015-04-11 NOTE — Discharge Summary (Signed)
PATIENT NAME:  Sara Mcdonald, Sara Mcdonald MR#:  [id number] DATE OF BIRTH:  [birth year]  DATE OF ADMISSION:  08/11/2014 DATE OF DISCHARGE:  08/13/2014  DISCHARGE DIAGNOSES:  1. Severe bronchitis with mild hypoxemia.  2. Severe gastritis.  3. Hyponatremia/hypokalemia.  4. Hypertension.  5. Hyperlipidemia.  6. Fibromyalgia.  7. Obstructive sleep apnea.  8. Migraine headaches.   DISCHARGE MEDICATIONS: Xanax 0.5 mg b.i.d. p.r.n. Ambien 10 mg at bedtime, Maxzide 25/37.5 daily, Pravachol 80 mg at bedtime, Protonix 40 mg b.i.d., lisinopril 10 mg daily, Lyrica 100 mg t.i.d., Colcrys 0.6 mg daily, Cymbalta 60 mg daily, Tylenol Number 3 b.i.d. p.r.n., MiraLax 17 grams daily, Ceftin 250 mg b.i.d. x 1 week, Carafate 1 gram q.a.c., at bedtime.   REASON FOR ADMISSION: A 69 year old female who presents with cough, congestion, and abdominal pain with electrolyte abnormalities. Please see H and P for HPI, past medical history, and physical exam.   HOSPITAL COURSE: The patient was admitted. Cardiac enzymes were negative. Her chest x-ray did not show pneumonia. She was treated with IV Rocephin and azithromycin and switched to Ceftin p.o. for discharge. Her cough did improve. She had significant epigastric pain treated with Carafate in addition to her b.i.d. PPI. Her symptoms did improve. Hers sodium and potassium were correct via oral and IV fluids and were normal on discharge. She will follow up with Dr. Sabra Heck in 1 month. She is instructed to use her sleep apnea machine every day.     ____________________________ [name], MD mfm:lt D: 08/13/2014 08:00:58 ET T: 08/13/2014 21:43:28 ET JOB#: [id number]  cc: [name], MD, <Dictator> Herschel Fleagle Roselee Culver MD ELECTRONICALLY SIGNED 08/14/2014 8:14

## 2016-02-11 DIAGNOSIS — Z0000 Encounter for general adult medical examination without abnormal findings: Secondary | ICD-10-CM | POA: Diagnosis not present

## 2016-02-11 DIAGNOSIS — E119 Type 2 diabetes mellitus without complications: Secondary | ICD-10-CM | POA: Diagnosis not present

## 2016-02-11 DIAGNOSIS — M542 Cervicalgia: Secondary | ICD-10-CM | POA: Diagnosis not present

## 2016-02-11 DIAGNOSIS — R0781 Pleurodynia: Secondary | ICD-10-CM | POA: Diagnosis not present

## 2016-02-12 ENCOUNTER — Other Ambulatory Visit: Payer: Self-pay | Admitting: Internal Medicine

## 2016-02-12 DIAGNOSIS — Z1231 Encounter for screening mammogram for malignant neoplasm of breast: Secondary | ICD-10-CM

## 2016-02-19 ENCOUNTER — Ambulatory Visit: Payer: Self-pay

## 2016-02-25 ENCOUNTER — Ambulatory Visit
Admission: RE | Admit: 2016-02-25 | Discharge: 2016-02-25 | Disposition: A | Discharge status: Home / Self Care | Payer: PPO | Source: Ambulatory Visit | Attending: Internal Medicine | Admitting: Internal Medicine

## 2016-02-25 DIAGNOSIS — Z1231 Encounter for screening mammogram for malignant neoplasm of breast: Secondary | ICD-10-CM

## 2016-03-04 ENCOUNTER — Other Ambulatory Visit: Payer: Self-pay | Admitting: Internal Medicine

## 2016-03-04 DIAGNOSIS — N63 Unspecified lump in unspecified breast: Secondary | ICD-10-CM

## 2016-03-18 ENCOUNTER — Other Ambulatory Visit: Payer: PPO

## 2016-03-18 ENCOUNTER — Ambulatory Visit: Payer: PPO

## 2016-03-27 IMAGING — US US BREAST*L* LIMITED INC AXILLA
1 series · 3 of 3 positions shown · non-contrast
Comparison: Previous exam(s).

CLINICAL DATA: 69-year-old female presenting for evaluation of a
tender palpable area of concern in the upper inner left breast.

EXAM:
2D DIGITAL DIAGNOSTIC BILATERAL MAMMOGRAM WITH CAD AND ADJUNCT TOMO
ULTRASOUND LEFT BREAST

[Series 1: us breast*left* limited inc axilla · 0.06mm/px · 3 of 3 slices shown]
[im 1/3]
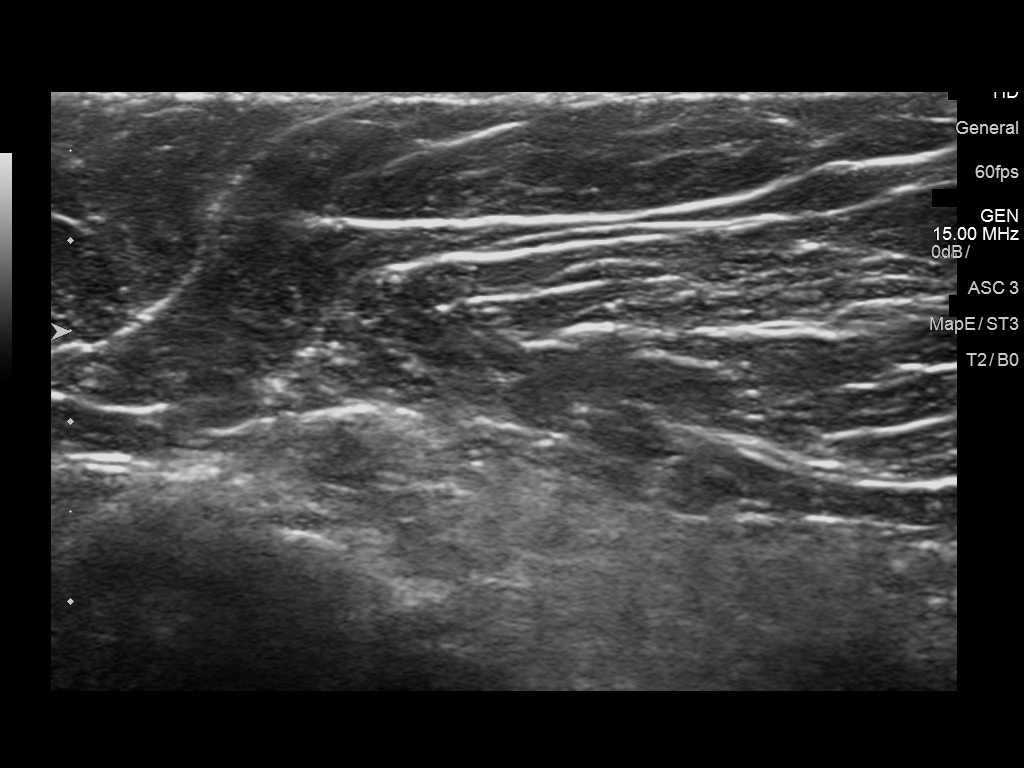
[im 2/3]
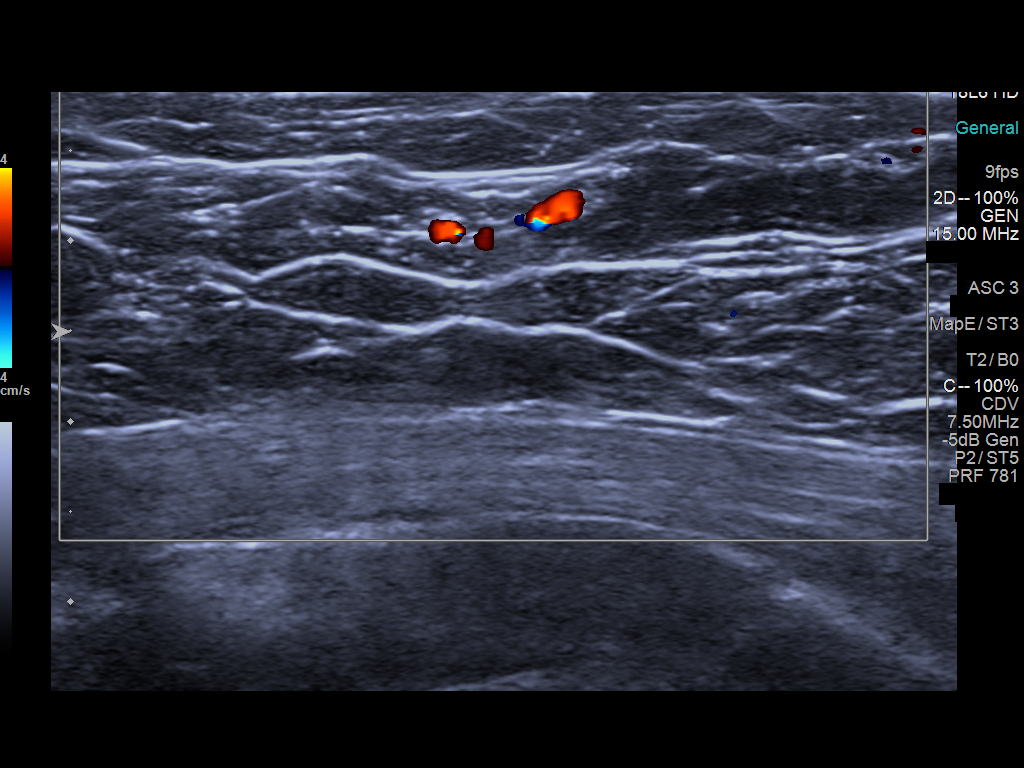
[im 3/3]
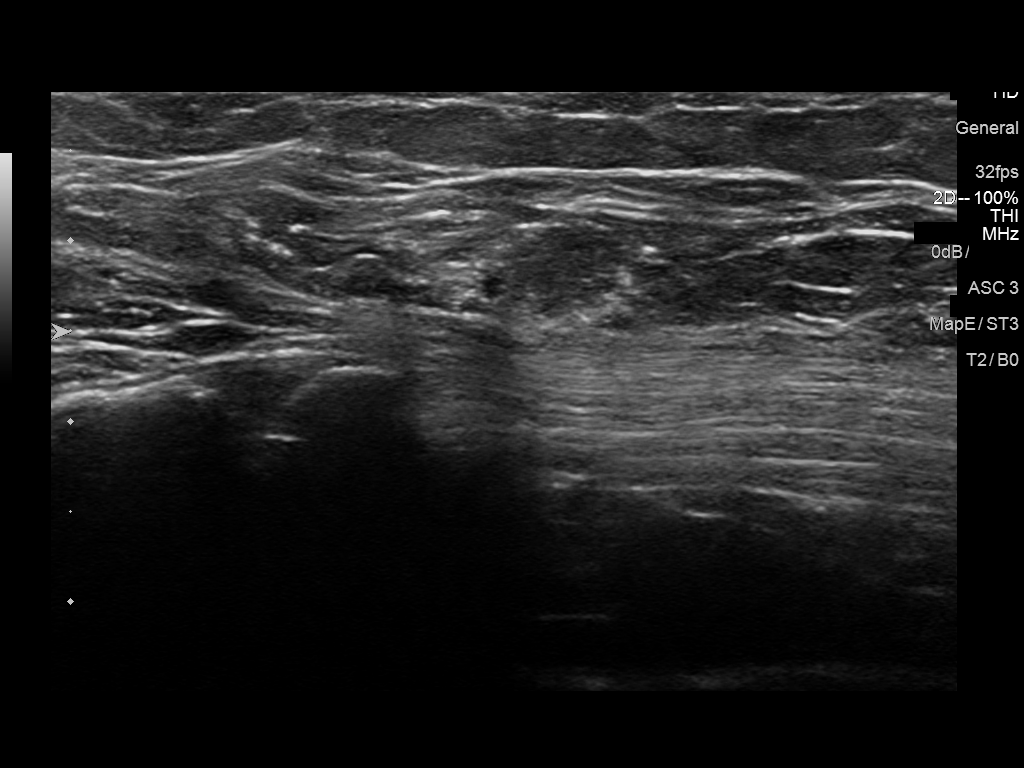

[3 of 3 positions shown; findings below may reference images not displayed]

ACR Breast Density Category b: There are scattered areas of
fibroglandular density.
FINDINGS: A palpable marker has been placed on the upper inner left breast. No
mammographic abnormalities are seen deep to this marker. No
suspicious calcifications, masses or areas of distortion are seen in
the bilateral breasts.

Mammographic images were processed with CAD.

On physical exam, no discrete palpable mass can be identified in the
palpable area of concern in the upper inner left breast.

Targeted ultrasound is performed from the 9 o'clock to the 12
o'clock position, showing normal subcutaneous tissue. No masses or
suspicious areas of shadowing are identified.
IMPRESSION: 1. No mammographic or targeted sonographic correlate for the tender
palpable area of concern in the upper inner left breast.

RECOMMENDATION:
1. Clinical follow-up recommended for the tender palpable area of
concern in the left breast. Any further workup should be based on
clinical grounds.

2.  Screening mammogram in one year.(Code:[UF])

I have discussed the findings and recommendations with the patient.
Results were also provided in writing at the conclusion of the
visit. If applicable, a reminder letter will be sent to the patient
regarding the next appointment.

BI-RADS CATEGORY  1: Negative.

## 2016-04-01 ENCOUNTER — Ambulatory Visit
Admission: RE | Admit: 2016-04-01 | Discharge: 2016-04-01 | Disposition: A | Discharge status: Home / Self Care | Payer: PPO | Source: Ambulatory Visit | Attending: Internal Medicine | Admitting: Internal Medicine

## 2016-04-01 DIAGNOSIS — R928 Other abnormal and inconclusive findings on diagnostic imaging of breast: Secondary | ICD-10-CM | POA: Diagnosis not present

## 2016-04-01 DIAGNOSIS — N63 Unspecified lump in unspecified breast: Secondary | ICD-10-CM

## 2016-04-01 DIAGNOSIS — N6489 Other specified disorders of breast: Secondary | ICD-10-CM | POA: Diagnosis not present

## 2016-04-01 IMAGING — MG MM DIGITAL DIAGNOSTIC BILAT W/ TOMO W/ CAD
8 of 15 series · 8 of 35 positions shown · non-contrast
Comparison: Previous exam(s).

CLINICAL DATA: 69-year-old female presenting for evaluation of a
tender palpable area of concern in the upper inner left breast.

EXAM:
2D DIGITAL DIAGNOSTIC BILATERAL MAMMOGRAM WITH CAD AND ADJUNCT TOMO
ULTRASOUND LEFT BREAST

[L TAN synth-2D]
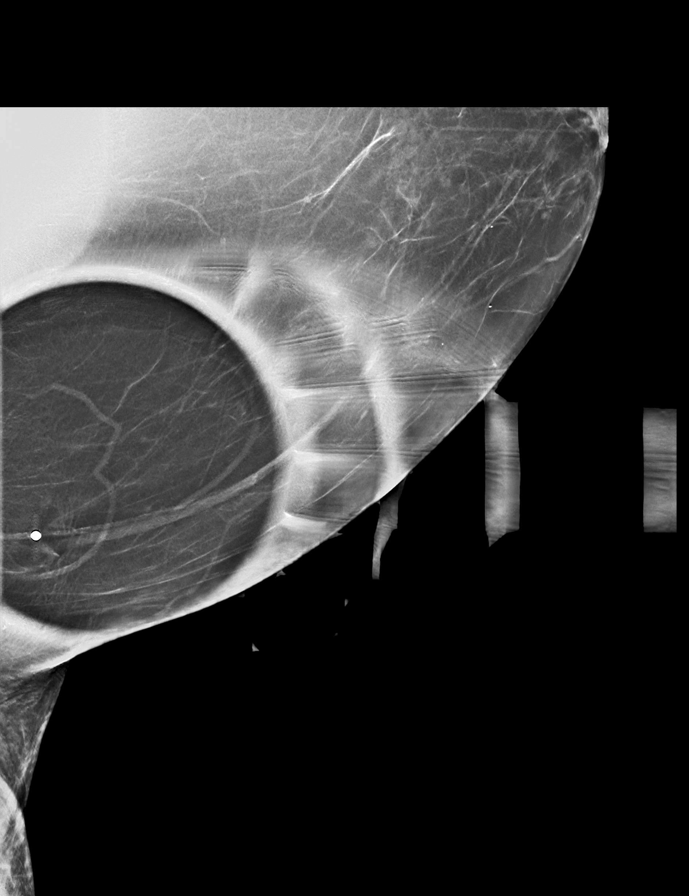

[L MLO]
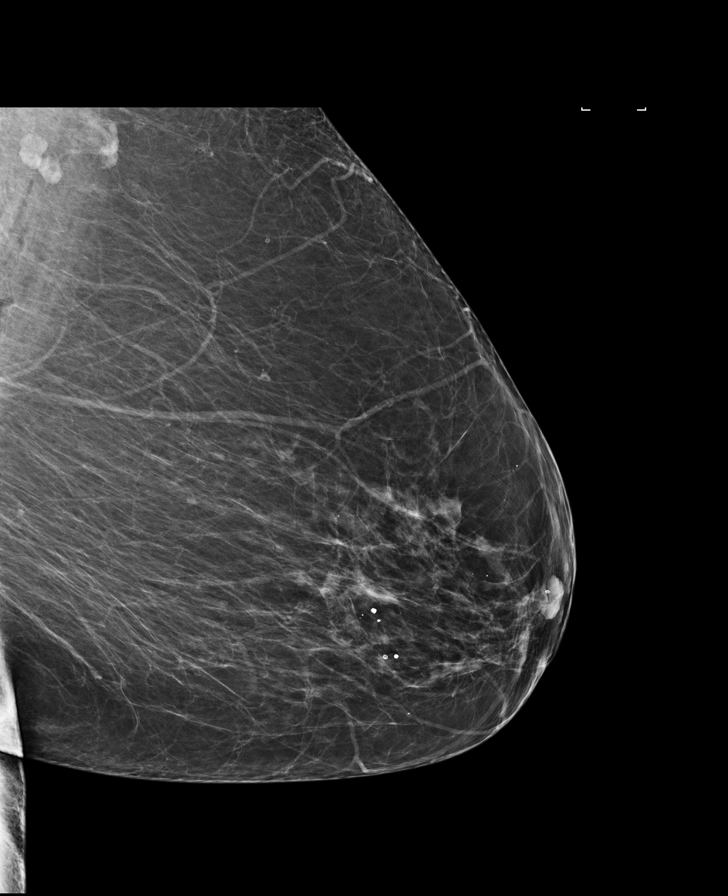

[L MLO synth-2D]
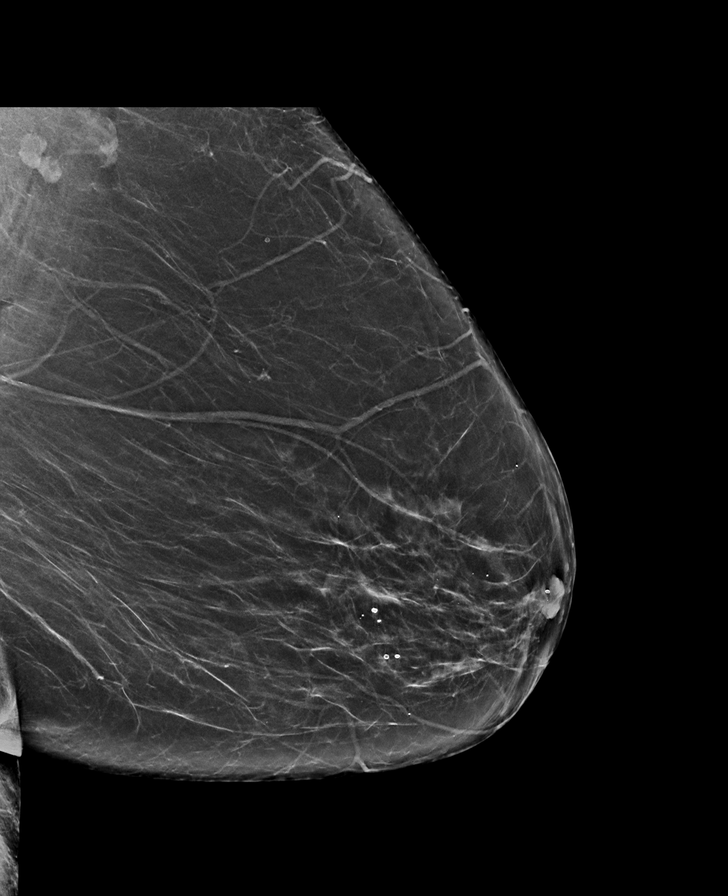

[R MLO synth-2D]
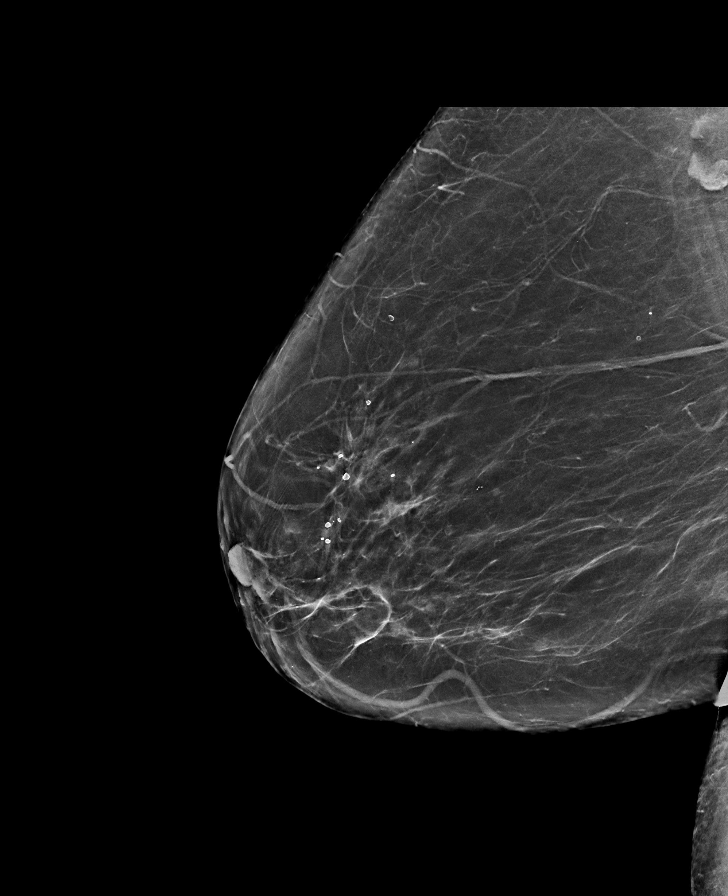

[R CC]
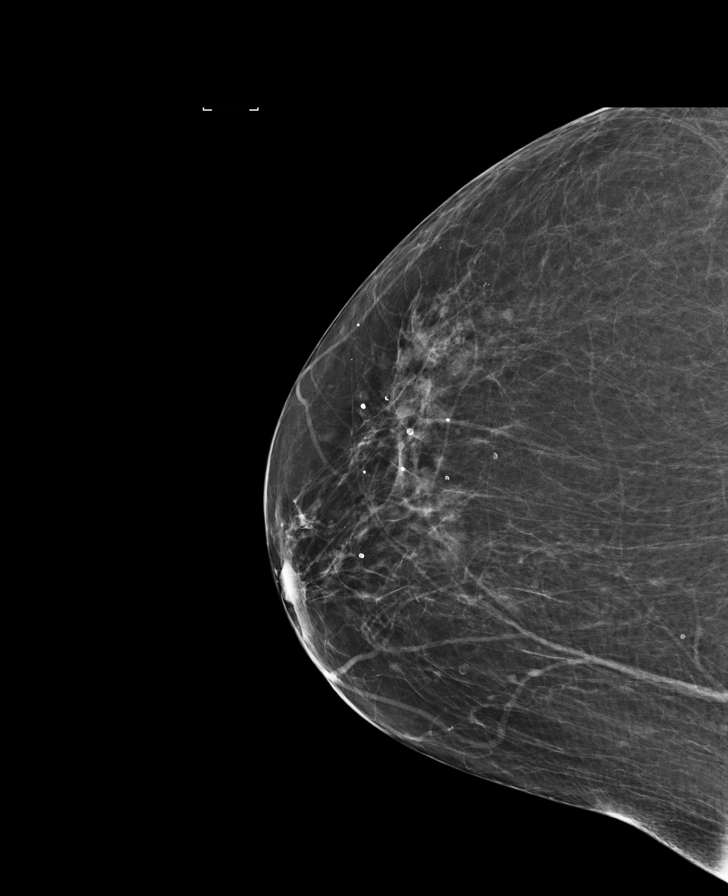

[L CC]
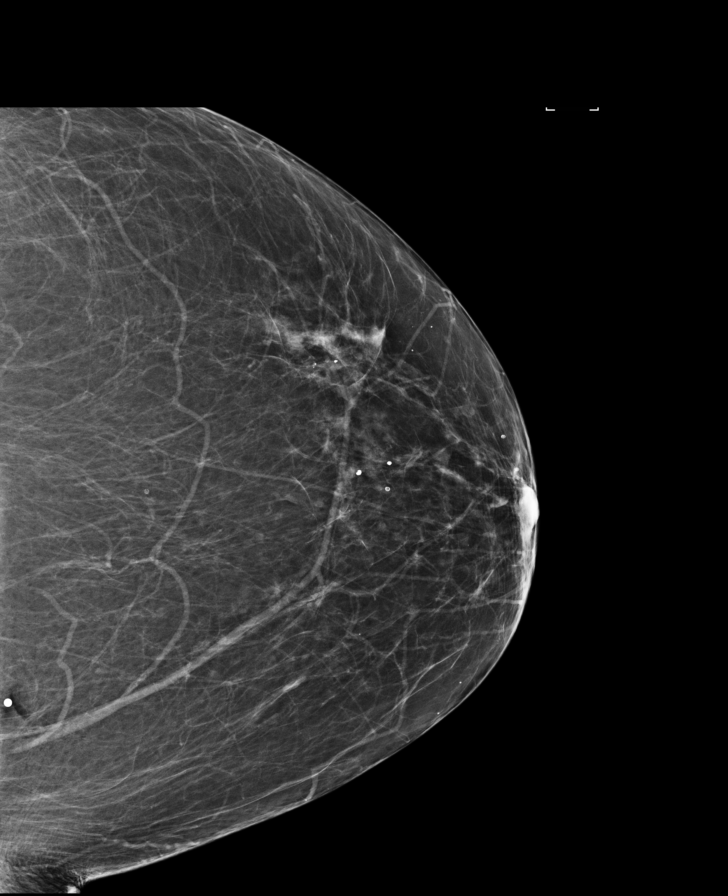

[L CC synth-2D]
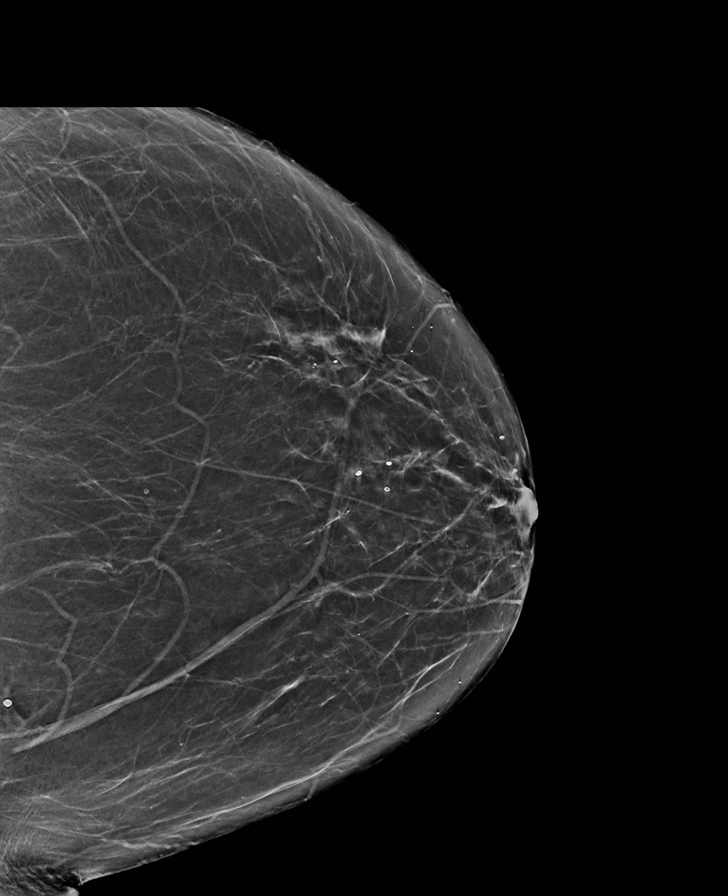

[R MLO]
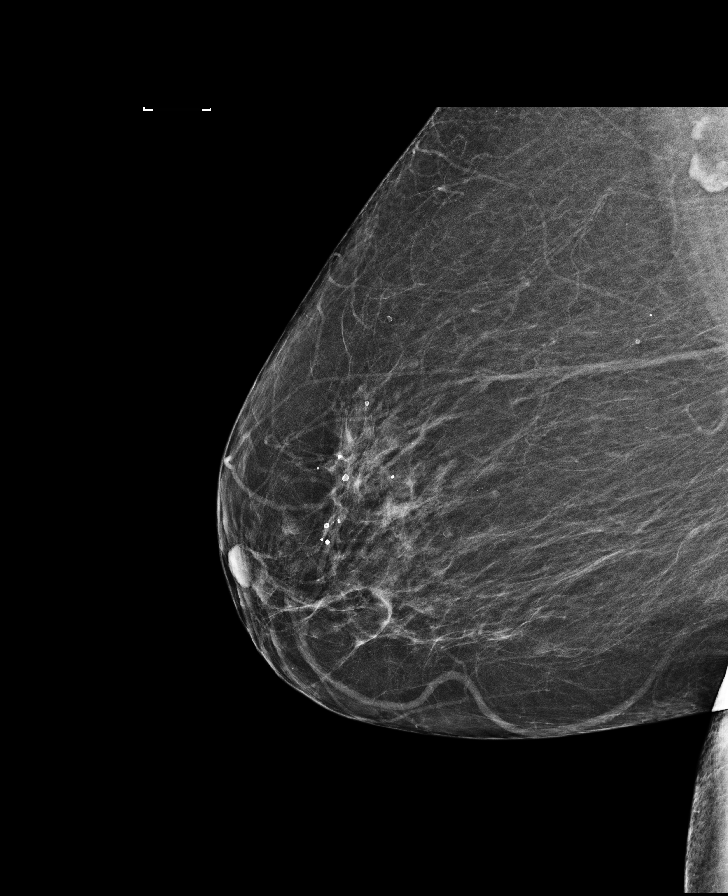

[8 of 35 positions shown; findings below may reference images not displayed]

ACR Breast Density Category b: There are scattered areas of
fibroglandular density.
FINDINGS: A palpable marker has been placed on the upper inner left breast. No
mammographic abnormalities are seen deep to this marker. No
suspicious calcifications, masses or areas of distortion are seen in
the bilateral breasts.

Mammographic images were processed with CAD.

On physical exam, no discrete palpable mass can be identified in the
palpable area of concern in the upper inner left breast.

Targeted ultrasound is performed from the 9 o'clock to the 12
o'clock position, showing normal subcutaneous tissue. No masses or
suspicious areas of shadowing are identified.
IMPRESSION: 1. No mammographic or targeted sonographic correlate for the tender
palpable area of concern in the upper inner left breast.

RECOMMENDATION:
1. Clinical follow-up recommended for the tender palpable area of
concern in the left breast. Any further workup should be based on
clinical grounds.

2.  Screening mammogram in one year.(Code:[UF])

I have discussed the findings and recommendations with the patient.
Results were also provided in writing at the conclusion of the
visit. If applicable, a reminder letter will be sent to the patient
regarding the next appointment.

BI-RADS CATEGORY  1: Negative.

## 2016-05-12 DIAGNOSIS — Z86010 Personal history of colonic polyps: Secondary | ICD-10-CM | POA: Diagnosis not present

## 2016-07-06 ENCOUNTER — Encounter: Payer: Self-pay | Admitting: *Deleted

## 2016-07-07 ENCOUNTER — Encounter
Admission: RE | Disposition: A | Discharge status: Home / Self Care | Payer: Self-pay | Source: Ambulatory Visit | Attending: Unknown Physician Specialty

## 2016-07-07 ENCOUNTER — Ambulatory Visit: Payer: PPO | Admitting: Anesthesiology

## 2016-07-07 ENCOUNTER — Ambulatory Visit
Admission: RE | Admit: 2016-07-07 | Discharge: 2016-07-07 | Disposition: A | Discharge status: Home / Self Care | Payer: PPO | Source: Ambulatory Visit | Attending: Unknown Physician Specialty | Admitting: Unknown Physician Specialty

## 2016-07-07 ENCOUNTER — Encounter: Payer: Self-pay | Admitting: *Deleted

## 2016-07-07 DIAGNOSIS — D124 Benign neoplasm of descending colon: Secondary | ICD-10-CM | POA: Diagnosis not present

## 2016-07-07 DIAGNOSIS — D122 Benign neoplasm of ascending colon: Secondary | ICD-10-CM | POA: Diagnosis not present

## 2016-07-07 DIAGNOSIS — K648 Other hemorrhoids: Secondary | ICD-10-CM | POA: Diagnosis not present

## 2016-07-07 DIAGNOSIS — Z79899 Other long term (current) drug therapy: Secondary | ICD-10-CM | POA: Diagnosis not present

## 2016-07-07 DIAGNOSIS — K5790 Diverticulosis of intestine, part unspecified, without perforation or abscess without bleeding: Secondary | ICD-10-CM | POA: Diagnosis not present

## 2016-07-07 DIAGNOSIS — Z6833 Body mass index (BMI) 33.0-33.9, adult: Secondary | ICD-10-CM | POA: Diagnosis not present

## 2016-07-07 DIAGNOSIS — Z9049 Acquired absence of other specified parts of digestive tract: Secondary | ICD-10-CM | POA: Insufficient documentation

## 2016-07-07 DIAGNOSIS — Z90710 Acquired absence of both cervix and uterus: Secondary | ICD-10-CM | POA: Diagnosis not present

## 2016-07-07 DIAGNOSIS — Z86010 Personal history of colonic polyps: Secondary | ICD-10-CM | POA: Insufficient documentation

## 2016-07-07 DIAGNOSIS — K640 First degree hemorrhoids: Secondary | ICD-10-CM | POA: Diagnosis not present

## 2016-07-07 DIAGNOSIS — E669 Obesity, unspecified: Secondary | ICD-10-CM | POA: Insufficient documentation

## 2016-07-07 DIAGNOSIS — K635 Polyp of colon: Secondary | ICD-10-CM | POA: Diagnosis not present

## 2016-07-07 DIAGNOSIS — Z1211 Encounter for screening for malignant neoplasm of colon: Secondary | ICD-10-CM | POA: Insufficient documentation

## 2016-07-07 DIAGNOSIS — Z803 Family history of malignant neoplasm of breast: Secondary | ICD-10-CM | POA: Insufficient documentation

## 2016-07-07 DIAGNOSIS — K5730 Diverticulosis of large intestine without perforation or abscess without bleeding: Secondary | ICD-10-CM | POA: Diagnosis not present

## 2016-07-07 HISTORY — DX: Fibromyalgia: M79.7

## 2016-07-07 HISTORY — DX: Unspecified osteoarthritis, unspecified site: M19.90

## 2016-07-07 HISTORY — DX: Headache: R51

## 2016-07-07 HISTORY — DX: Hyperlipidemia, unspecified: E78.5

## 2016-07-07 HISTORY — DX: Sleep apnea, unspecified: G47.30

## 2016-07-07 HISTORY — DX: Headache, unspecified: R51.9

## 2016-07-07 HISTORY — PX: COLONOSCOPY WITH PROPOFOL: SHX5780

## 2016-07-07 HISTORY — DX: Type 2 diabetes mellitus without complications: E11.9

## 2016-07-07 HISTORY — DX: Essential (primary) hypertension: I10

## 2016-07-07 LAB — GLUCOSE, CAPILLARY: Glucose-Capillary: 96 mg/dL (ref 65–99)

## 2016-07-07 SURGERY — COLONOSCOPY WITH PROPOFOL
Anesthesia: General

## 2016-07-07 MED ORDER — PROPOFOL 500 MG/50ML IV EMUL
INTRAVENOUS | Status: DC | PRN
  Administered 2016-07-07: 120 ug/kg/min via INTRAVENOUS

## 2016-07-07 MED ORDER — SODIUM CHLORIDE 0.9 % IV SOLN
INTRAVENOUS | Status: DC
  Administered 2016-07-07: 07:00:00 via INTRAVENOUS

## 2016-07-07 MED ORDER — PROPOFOL 10 MG/ML IV BOLUS
INTRAVENOUS | Status: DC | PRN
  Administered 2016-07-07: 30 mg via INTRAVENOUS

## 2016-07-07 MED ORDER — MIDAZOLAM HCL 2 MG/2ML IJ SOLN
INTRAMUSCULAR | Status: DC | PRN
  Administered 2016-07-07: 1 mg via INTRAVENOUS

## 2016-07-07 MED ORDER — FENTANYL CITRATE (PF) 100 MCG/2ML IJ SOLN
INTRAMUSCULAR | Status: DC | PRN
Start: 2016-07-07 — End: 2016-07-08
  Administered 2016-07-07: 50 ug via INTRAVENOUS

## 2016-07-07 MED ORDER — SODIUM CHLORIDE 0.9 % IV SOLN: INTRAVENOUS | Status: DC

## 2016-07-07 NOTE — Anesthesia Preprocedure Evaluation (Signed)
Anesthesia Evaluation  Patient identified by MRN, date of birth, ID band Patient awake    Reviewed: Allergy & Precautions, NPO status , Patient's Chart, lab work & pertinent test results  Airway Mallampati: III  TM Distance: <3 FB     Dental  (+) Teeth Intact, Caps   Pulmonary sleep apnea ,    breath sounds clear to auscultation       Cardiovascular Exercise Tolerance: Good hypertension, Pt. on medications  Rhythm:Regular     Neuro/Psych  Headaches,    GI/Hepatic negative GI ROS, Neg liver ROS,   Endo/Other  diabetes, Type 2, Oral Hypoglycemic Agents  Renal/GU negative Renal ROS     Musculoskeletal   Abdominal (+) + obese,   Peds  Hematology   Anesthesia Other Findings   Reproductive/Obstetrics                             Anesthesia Physical Anesthesia Plan  ASA: III  Anesthesia Plan: General   Post-op Pain Management:    Induction: Intravenous  Airway Management Planned: Natural Airway and Nasal Cannula  Additional Equipment:   Intra-op Plan:   Post-operative Plan:   Informed Consent: I have reviewed the patients History and Physical, chart, labs and discussed the procedure including the risks, benefits and alternatives for the proposed anesthesia with the patient or authorized representative who has indicated his/her understanding and acceptance.     Plan Discussed with: CRNA  Anesthesia Plan Comments:         Anesthesia Quick Evaluation

## 2016-07-07 NOTE — H&P (Signed)
Primary Care Physician:  [name], MD Primary Gastroenterologist:  Dr. Vira Agar  Pre-Procedure History & Physical: HPI:  Sara Mcdonald is a 70 y.o. female is here for an colonoscopy.   Past Medical History  Diagnosis Date  . Elevated lipids   . Diabetes mellitus without complication ([place])   . Fibromyalgia   . Hypertension   . Headache   . Sleep apnea   . OA (osteoarthritis)     Past Surgical History  Procedure Laterality Date  . Appendectomy    . Cholecystectomy    . Colonoscopy    . Esophagogastroduodenoscopy    . Abdominal hysterectomy    . Nanoflex Bilateral   . Flexible sigmoidoscopy      Prior to Admission medications   Medication Sig Start Date End Date Taking? Authorizing Provider  acetaminophen-codeine (TYLENOL #3) 300-30 MG tablet Take by mouth every 4 (four) hours as needed for moderate pain.   Yes Historical Provider, MD  buPROPion (WELLBUTRIN SR) 150 MG 12 hr tablet Take 150 mg by mouth 2 (two) times daily.   Yes Historical Provider, MD  DULoxetine (CYMBALTA) 60 MG capsule Take 60 mg by mouth daily.   Yes Historical Provider, MD  ketorolac (TORADOL) 10 MG tablet Take 10 mg by mouth every 6 (six) hours as needed.   Yes Historical Provider, MD  lisinopril (PRINIVIL,ZESTRIL) 10 MG tablet Take 10 mg by mouth daily.   Yes Historical Provider, MD  metFORMIN (GLUCOPHAGE) 500 MG tablet Take 500 mg by mouth 2 (two) times daily with a meal.   Yes Historical Provider, MD  Multiple Vitamin (MULTIVITAMIN) capsule Take 1 capsule by mouth daily.   Yes Historical Provider, MD  nystatin-triamcinolone (MYCOLOG II) cream Apply 1 application topically 2 (two) times daily.   Yes Historical Provider, MD  pantoprazole (PROTONIX) 40 MG tablet Take 40 mg by mouth daily.   Yes Historical Provider, MD  pravastatin (PRAVACHOL) 80 MG tablet Take 80 mg by mouth daily.   Yes Historical Provider, MD  promethazine (PHENERGAN) 25 MG suppository Place 25 mg rectally every 6 (six) hours as  needed for nausea or vomiting.   Yes Historical Provider, MD  traZODone (DESYREL) 100 MG tablet Take 100 mg by mouth at bedtime.   Yes Historical Provider, MD  triamterene-hydrochlorothiazide (MAXZIDE-25) 37.5-25 MG tablet Take 1 tablet by mouth daily.   Yes Historical Provider, MD  zolpidem (AMBIEN) 10 MG tablet Take 10 mg by mouth at bedtime as needed for sleep.   Yes Historical Provider, MD    Allergies as of 06/09/2016  . (Not on File)    Family History  Problem Relation Age of Onset  . Breast cancer Paternal Aunt 34    Social History   Social History  . Marital Status: Married    Spouse Name: N/A  . Number of Children: N/A  . Years of Education: N/A   Occupational History  . Not on file.   Social History Main Topics  . Smoking status: Never Smoker   . Smokeless tobacco: Never Used  . Alcohol Use: No  . Drug Use: No  . Sexual Activity: Not on file   Other Topics Concern  . Not on file   Social History Narrative    Review of Systems: See HPI, otherwise negative ROS  Physical Exam: BP 171/72 mmHg  Pulse 97  Temp(Src) 98.2 F (36.8 C) (Tympanic)  Resp 16  Ht 4\' 11"  (1.499 m)  Wt 75.297 kg (166 lb)  BMI 33.51  kg/m2  SpO2 98% General:   Alert,  pleasant and cooperative in NAD Head:  Normocephalic and atraumatic. Neck:  Supple; no masses or thyromegaly. Lungs:  Clear throughout to auscultation.    Heart:  Regular rate and rhythm. Abdomen:  Soft, nontender and nondistended. Normal bowel sounds, without guarding, and without rebound.   Neurologic:  Alert and  oriented x4;  grossly normal neurologically.  Impression/Plan: Sara Mcdonald is here for an colonoscopy to be performed for [facility] colon polyps  Risks, benefits, limitations, and alternatives regarding  colonoscopy have been reviewed with the patient.  Questions have been answered.  All parties agreeable.   Sara Cheers, MD  07/07/2016, 7:31 AM

## 2016-07-07 NOTE — Transfer of Care (Signed)
Immediate Anesthesia Transfer of Care Note  Patient: Sara Mcdonald  Procedure(s) Performed: Procedure(s): COLONOSCOPY WITH PROPOFOL (N/A)  Patient Location: PACU  Anesthesia Type:General  Level of Consciousness: awake and alert   Airway & Oxygen Therapy: Patient Spontanous Breathing and Patient connected to nasal cannula oxygen  Post-op Assessment: Report given to RN  Post vital signs: Reviewed  Last Vitals:  Filed Vitals:   07/07/16 0655  BP: 171/72  Pulse: 97  Temp: 36.8 C  Resp: 16    Last Pain: There were no vitals filed for this visit.       Complications: No apparent anesthesia complications

## 2016-07-07 NOTE — Op Note (Signed)
[facility] Gastroenterology Patient Name: Sara Mcdonald Procedure Date: 07/07/2016 7:28 AM MRN: [id number] Account #: [id number] Date of Birth: July 01, 1946 Admit Type: Outpatient Age: 70 Room: [facility] ENDO ROOM 1 Gender: Female Note Status: Finalized Procedure:            Colonoscopy Indications:          High risk colon cancer surveillance: Personal history                        of colonic polyps Providers:            Manya Silvas, MD Referring MD:         [name], MD (Referring MD) Medicines:            Propofol per Anesthesia Complications:        No immediate complications. Procedure:            Pre-Anesthesia Assessment:                       - After reviewing the risks and benefits, the patient                        was deemed in satisfactory condition to undergo the                        procedure.                       After obtaining informed consent, the colonoscope was                        passed under direct vision. Throughout the procedure,                        the patient's blood pressure, pulse, and oxygen                        saturations were monitored continuously. The                        Colonoscope was introduced through the anus and                        advanced to the the cecum, identified by appendiceal                        orifice and ileocecal valve. The colonoscopy was                        performed without difficulty. The patient tolerated the                        procedure well. The quality of the bowel preparation                        was good. Findings:      Four sessile polyps were found in the ascending colon. The polyps were       diminutive in size. These polyps were removed with a jumbo cold forceps.       Resection and retrieval were complete.      Two sessile  polyps were found in the descending colon. The polyps were       diminutive in size. These polyps were removed with a jumbo cold forceps.        Resection and retrieval were complete.      Multiple small-mouthed diverticula were found in the sigmoid colon and       descending colon.      Internal hemorrhoids were found during endoscopy. The hemorrhoids were       small and Grade I (internal hemorrhoids that do not prolapse).      The exam was otherwise without abnormality. Impression:           - Four diminutive polyps in the ascending colon,                        removed with a jumbo cold forceps. Resected and                        retrieved.                       - Two diminutive polyps in the descending colon,                        removed with a jumbo cold forceps. Resected and                        retrieved.                       - Diverticulosis in the sigmoid colon and in the                        descending colon.                       - Internal hemorrhoids.                       - The examination was otherwise normal. Recommendation:       - Await pathology results. Manya Silvas, MD 07/07/2016 8:08:24 AM This report has been signed electronically. Number of Addenda: 0 Note Initiated On: 07/07/2016 7:28 AM Scope Withdrawal Time: 0 hours 16 minutes 24 seconds  Total Procedure Duration: 0 hours 25 minutes 55 seconds       [facility]

## 2016-07-08 ENCOUNTER — Encounter: Payer: Self-pay | Admitting: Unknown Physician Specialty

## 2016-07-08 LAB — SURGICAL PATHOLOGY

## 2016-07-08 NOTE — Anesthesia Postprocedure Evaluation (Signed)
Anesthesia Post Note  Patient: Sara Mcdonald  Procedure(s) Performed: Procedure(s) (LRB): COLONOSCOPY WITH PROPOFOL (N/A)  Patient location during evaluation: PACU Anesthesia Type: General Level of consciousness: awake Pain management: pain level controlled Vital Signs Assessment: post-procedure vital signs reviewed and stable Respiratory status: spontaneous breathing Cardiovascular status: blood pressure returned to baseline Anesthetic complications: no    Last Vitals:  Filed Vitals:   07/07/16 0830 07/07/16 0840  BP: 142/91 155/95  Pulse: 95 93  Temp:    Resp: 15 15    Last Pain:  Filed Vitals:   07/08/16 0741  PainSc: 0-No pain                 VAN STAVEREN,Layan Zalenski

## 2016-08-16 DIAGNOSIS — Z0000 Encounter for general adult medical examination without abnormal findings: Secondary | ICD-10-CM | POA: Diagnosis not present

## 2016-08-16 DIAGNOSIS — E119 Type 2 diabetes mellitus without complications: Secondary | ICD-10-CM | POA: Diagnosis not present

## 2016-08-23 DIAGNOSIS — E119 Type 2 diabetes mellitus without complications: Secondary | ICD-10-CM | POA: Diagnosis not present

## 2016-08-23 DIAGNOSIS — M5010 Cervical disc disorder with radiculopathy, unspecified cervical region: Secondary | ICD-10-CM | POA: Diagnosis not present

## 2016-09-19 DIAGNOSIS — E089 Diabetes mellitus due to underlying condition without complications: Secondary | ICD-10-CM | POA: Diagnosis not present

## 2016-09-19 DIAGNOSIS — E119 Type 2 diabetes mellitus without complications: Secondary | ICD-10-CM | POA: Diagnosis not present

## 2016-09-19 DIAGNOSIS — Z961 Presence of intraocular lens: Secondary | ICD-10-CM | POA: Diagnosis not present

## 2017-01-20 DIAGNOSIS — R05 Cough: Secondary | ICD-10-CM | POA: Diagnosis not present

## 2017-01-20 DIAGNOSIS — J40 Bronchitis, not specified as acute or chronic: Secondary | ICD-10-CM | POA: Diagnosis not present

## 2017-01-20 DIAGNOSIS — J4522 Mild intermittent asthma with status asthmaticus: Secondary | ICD-10-CM | POA: Diagnosis not present

## 2017-01-23 DIAGNOSIS — J40 Bronchitis, not specified as acute or chronic: Secondary | ICD-10-CM | POA: Diagnosis not present

## 2017-01-23 DIAGNOSIS — J4522 Mild intermittent asthma with status asthmaticus: Secondary | ICD-10-CM | POA: Diagnosis not present

## 2017-02-01 ENCOUNTER — Emergency Department: Payer: PPO

## 2017-02-01 ENCOUNTER — Inpatient Hospital Stay
Admission: EM | Admit: 2017-02-01 | Discharge: 2017-02-03 | DRG: 871 | Disposition: A | Discharge status: Home / Self Care | Payer: PPO | Attending: Specialist | Admitting: Specialist

## 2017-02-01 ENCOUNTER — Encounter: Payer: Self-pay | Admitting: Medical Oncology

## 2017-02-01 DIAGNOSIS — E872 Acidosis: Secondary | ICD-10-CM | POA: Diagnosis present

## 2017-02-01 DIAGNOSIS — Z880 Allergy status to penicillin: Secondary | ICD-10-CM | POA: Diagnosis not present

## 2017-02-01 DIAGNOSIS — I959 Hypotension, unspecified: Secondary | ICD-10-CM

## 2017-02-01 DIAGNOSIS — E785 Hyperlipidemia, unspecified: Secondary | ICD-10-CM | POA: Diagnosis present

## 2017-02-01 DIAGNOSIS — R0902 Hypoxemia: Secondary | ICD-10-CM | POA: Diagnosis not present

## 2017-02-01 DIAGNOSIS — F419 Anxiety disorder, unspecified: Secondary | ICD-10-CM | POA: Diagnosis not present

## 2017-02-01 DIAGNOSIS — Z7984 Long term (current) use of oral hypoglycemic drugs: Secondary | ICD-10-CM

## 2017-02-01 DIAGNOSIS — I129 Hypertensive chronic kidney disease with stage 1 through stage 4 chronic kidney disease, or unspecified chronic kidney disease: Secondary | ICD-10-CM | POA: Diagnosis present

## 2017-02-01 DIAGNOSIS — J9601 Acute respiratory failure with hypoxia: Secondary | ICD-10-CM | POA: Diagnosis present

## 2017-02-01 DIAGNOSIS — Z8249 Family history of ischemic heart disease and other diseases of the circulatory system: Secondary | ICD-10-CM | POA: Diagnosis not present

## 2017-02-01 DIAGNOSIS — F329 Major depressive disorder, single episode, unspecified: Secondary | ICD-10-CM | POA: Diagnosis not present

## 2017-02-01 DIAGNOSIS — J189 Pneumonia, unspecified organism: Secondary | ICD-10-CM | POA: Diagnosis present

## 2017-02-01 DIAGNOSIS — Z888 Allergy status to other drugs, medicaments and biological substances status: Secondary | ICD-10-CM

## 2017-02-01 DIAGNOSIS — R05 Cough: Secondary | ICD-10-CM | POA: Diagnosis not present

## 2017-02-01 DIAGNOSIS — N183 Chronic kidney disease, stage 3 (moderate): Secondary | ICD-10-CM | POA: Diagnosis not present

## 2017-02-01 DIAGNOSIS — G4733 Obstructive sleep apnea (adult) (pediatric): Secondary | ICD-10-CM | POA: Diagnosis present

## 2017-02-01 DIAGNOSIS — Z66 Do not resuscitate: Secondary | ICD-10-CM | POA: Diagnosis present

## 2017-02-01 DIAGNOSIS — R0600 Dyspnea, unspecified: Secondary | ICD-10-CM | POA: Diagnosis not present

## 2017-02-01 DIAGNOSIS — R0602 Shortness of breath: Secondary | ICD-10-CM | POA: Diagnosis not present

## 2017-02-01 DIAGNOSIS — R079 Chest pain, unspecified: Secondary | ICD-10-CM | POA: Diagnosis not present

## 2017-02-01 DIAGNOSIS — Z882 Allergy status to sulfonamides status: Secondary | ICD-10-CM | POA: Diagnosis not present

## 2017-02-01 DIAGNOSIS — E1122 Type 2 diabetes mellitus with diabetic chronic kidney disease: Secondary | ICD-10-CM | POA: Diagnosis not present

## 2017-02-01 DIAGNOSIS — J45909 Unspecified asthma, uncomplicated: Secondary | ICD-10-CM | POA: Diagnosis present

## 2017-02-01 DIAGNOSIS — A419 Sepsis, unspecified organism: Principal | ICD-10-CM | POA: Diagnosis present

## 2017-02-01 DIAGNOSIS — E119 Type 2 diabetes mellitus without complications: Secondary | ICD-10-CM

## 2017-02-01 DIAGNOSIS — M797 Fibromyalgia: Secondary | ICD-10-CM | POA: Diagnosis not present

## 2017-02-01 HISTORY — DX: Depression, unspecified: F32.A

## 2017-02-01 HISTORY — DX: Major depressive disorder, single episode, unspecified: F32.9

## 2017-02-01 HISTORY — DX: Anxiety disorder, unspecified: F41.9

## 2017-02-01 LAB — CBC
HCT: 40.1 % (ref 35.0–47.0)
Hemoglobin: 13.8 g/dL (ref 12.0–16.0)
MCH: 29.4 pg (ref 26.0–34.0)
MCHC: 34.3 g/dL (ref 32.0–36.0)
MCV: 85.8 fL (ref 80.0–100.0)
PLATELETS: 270 10*3/uL (ref 150–440)
RBC: 4.67 MIL/uL (ref 3.80–5.20)
RDW: 12.4 % (ref 11.5–14.5)
WBC: 12.2 10*3/uL — AB (ref 3.6–11.0)

## 2017-02-01 LAB — URINALYSIS, COMPLETE (UACMP) WITH MICROSCOPIC
BACTERIA UA: NONE SEEN
BILIRUBIN URINE: NEGATIVE
Glucose, UA: NEGATIVE mg/dL
Hgb urine dipstick: NEGATIVE
Ketones, ur: NEGATIVE mg/dL
Leukocytes, UA: NEGATIVE
NITRITE: NEGATIVE
Protein, ur: NEGATIVE mg/dL
RBC / HPF: NONE SEEN RBC/hpf (ref 0–5)
SPECIFIC GRAVITY, URINE: 1.016 (ref 1.005–1.030)
pH: 7 (ref 5.0–8.0)

## 2017-02-01 LAB — INFLUENZA PANEL BY PCR (TYPE A & B)
INFLAPCR: NEGATIVE
INFLBPCR: NEGATIVE

## 2017-02-01 LAB — BASIC METABOLIC PANEL
Anion gap: 7 (ref 5–15)
BUN: 20 mg/dL (ref 6–20)
CO2: 27 mmol/L (ref 22–32)
CREATININE: 1.19 mg/dL — AB (ref 0.44–1.00)
Calcium: 9.9 mg/dL (ref 8.9–10.3)
Chloride: 96 mmol/L — ABNORMAL LOW (ref 101–111)
GFR calc Af Amer: 52 mL/min — ABNORMAL LOW (ref >60)
GFR, EST NON AFRICAN AMERICAN: 45 mL/min — AB (ref >60)
Glucose, Bld: 210 mg/dL — ABNORMAL HIGH (ref 65–99)
Potassium: 4 mmol/L (ref 3.5–5.1)
SODIUM: 130 mmol/L — AB (ref 135–145)

## 2017-02-01 LAB — LACTIC ACID, PLASMA
Lactic Acid, Venous: 2 mmol/L (ref 0.5–1.9)
Lactic Acid, Venous: 1.1 mmol/L (ref 0.5–1.9)

## 2017-02-01 LAB — TROPONIN I: Troponin I: <0.03 ng/mL (ref <0.03)

## 2017-02-01 IMAGING — CT CT ANGIO CHEST
1 of 6 series · 19 of 36 positions shown · IV contrast (APPLIED)
Comparison: Chest radiography same day

CLINICAL DATA: Influenza diagnosis 3 weeks ago. Left-sided chest
pain, productive cough and shortness of breath.

EXAM:
CT ANGIOGRAPHY CHEST WITH CONTRAST
TECHNIQUE: Multidetector CT imaging of the chest was performed using the
standard protocol during bolus administration of intravenous
contrast. Multiplanar CT image reconstructions and MIPs were
obtained to evaluate the vascular anatomy.
CONTRAST:  75 cc Isovue 370

[Series 5: thins · axial · 0.65mm/px · z∈[-468,-212]mm · 19 of 287 slices shown]
[im 15/287  lung]
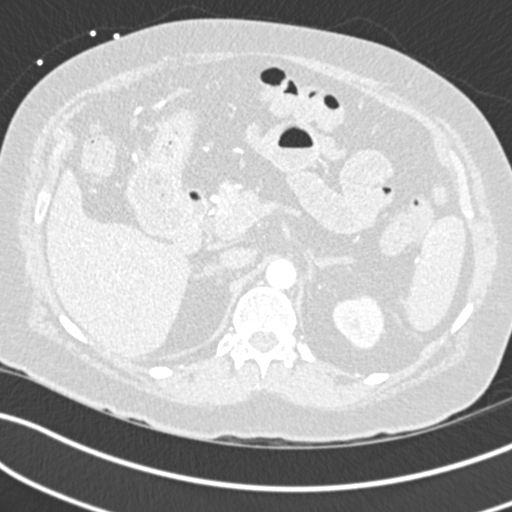
[im 29/287  mediastinal]
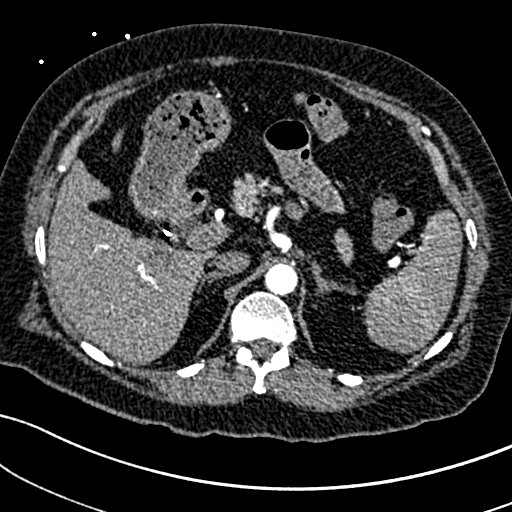
[im 43/287  lung]
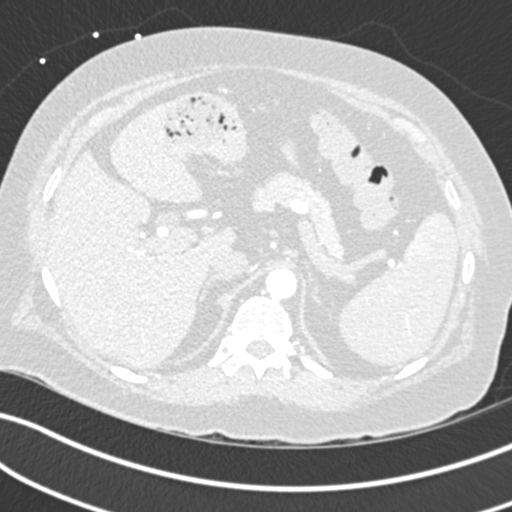
[im 58/287  mediastinal]
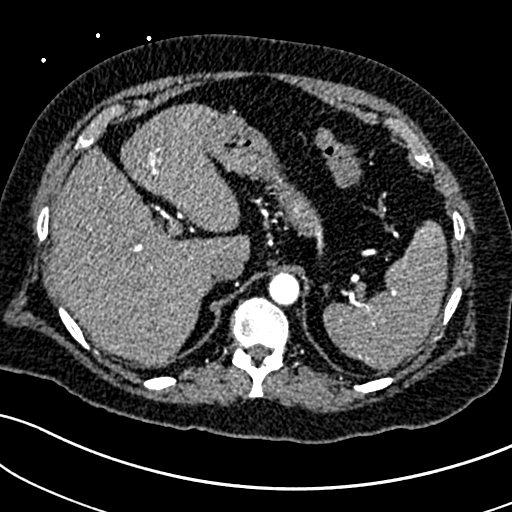
[im 72/287  lung]
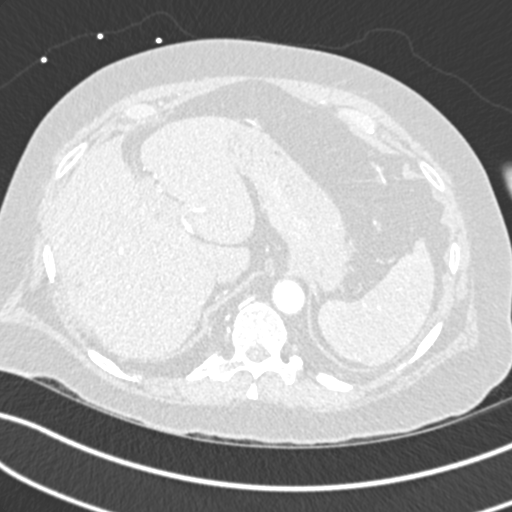
[im 86/287  mediastinal]
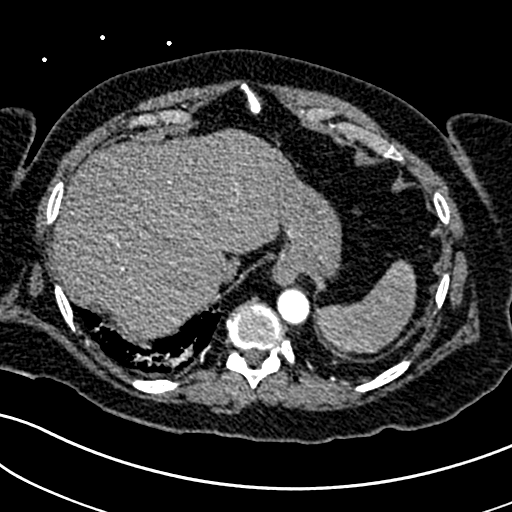
[im 101/287  lung]
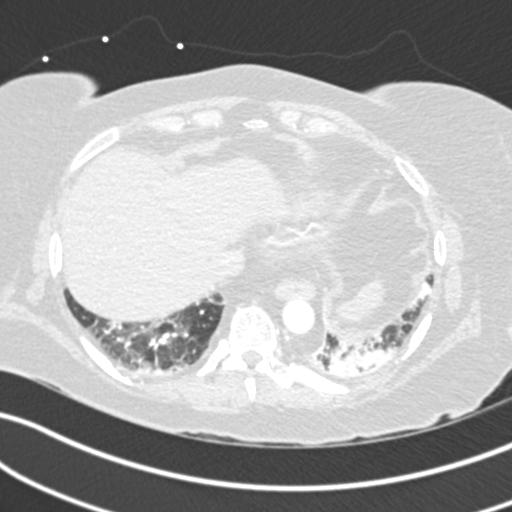
[im 115/287  mediastinal]
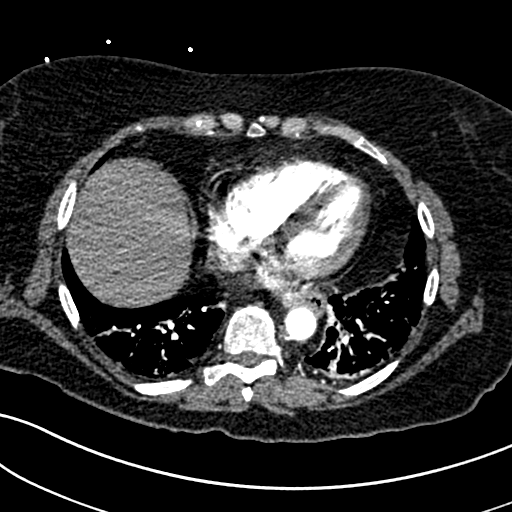
[im 129/287  lung]
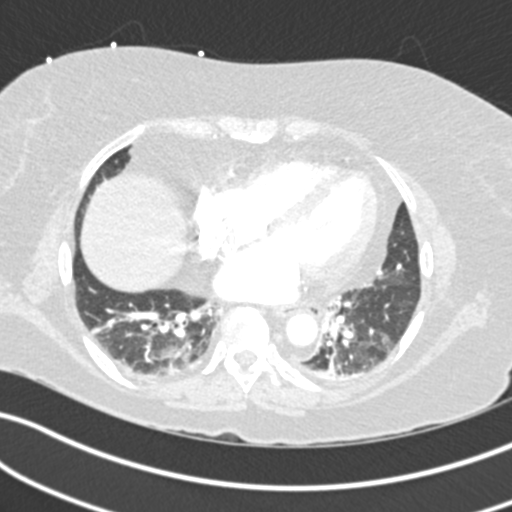
[im 144/287  mediastinal]
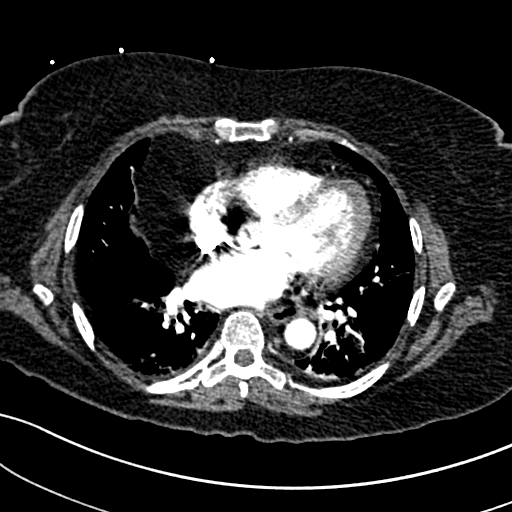
[im 158/287  lung]
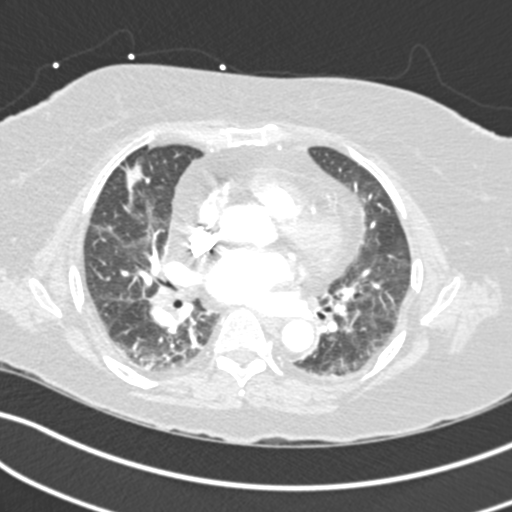
[im 172/287  mediastinal]
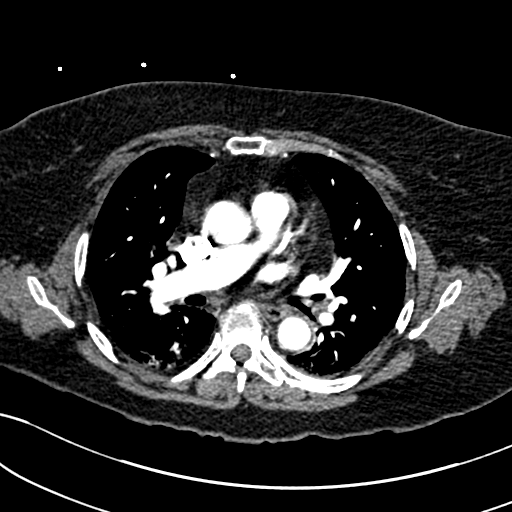
[im 186/287  lung]
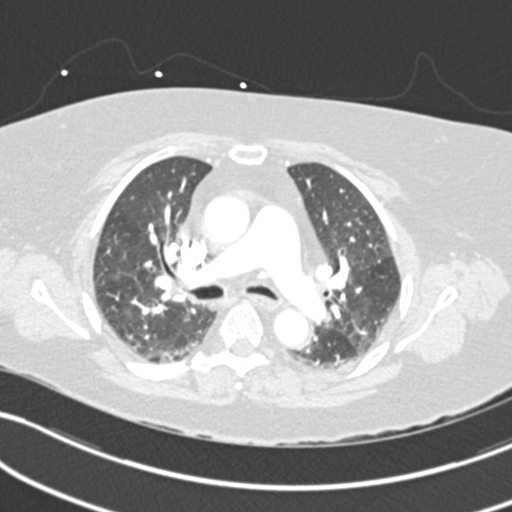
[im 201/287  mediastinal]
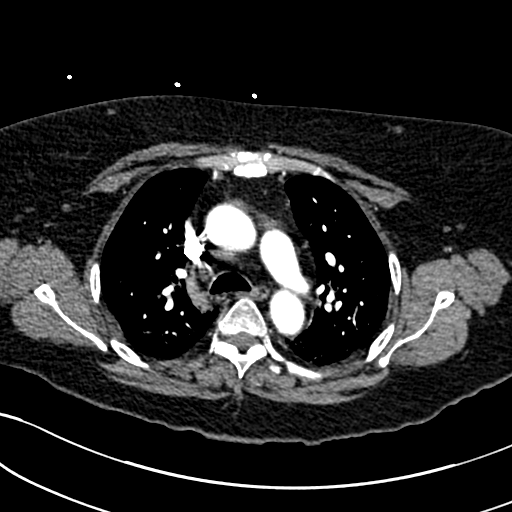
[im 215/287  lung]
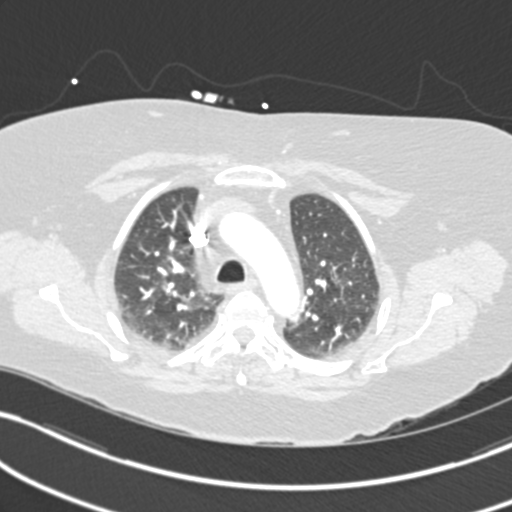
[im 229/287  mediastinal]
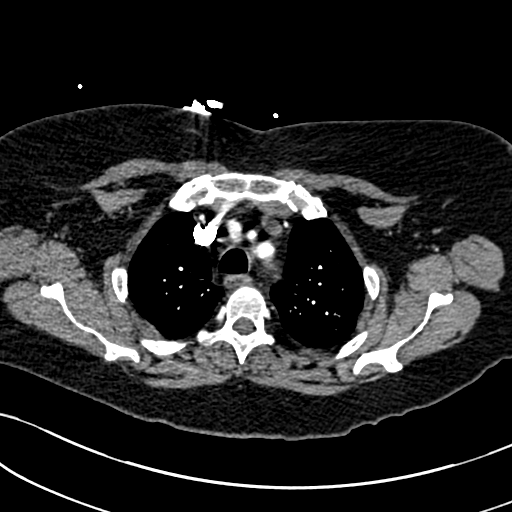
[im 244/287  lung]
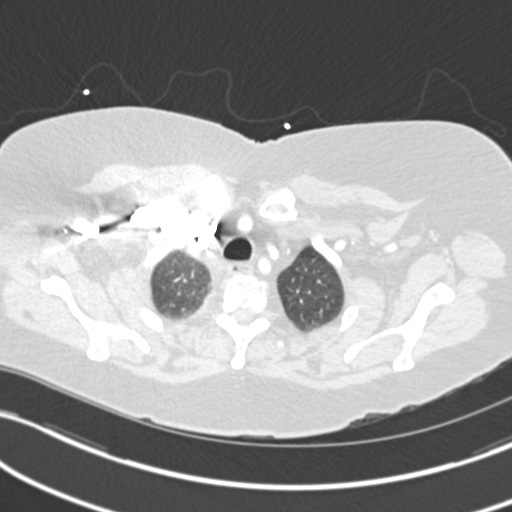
[im 258/287  mediastinal]
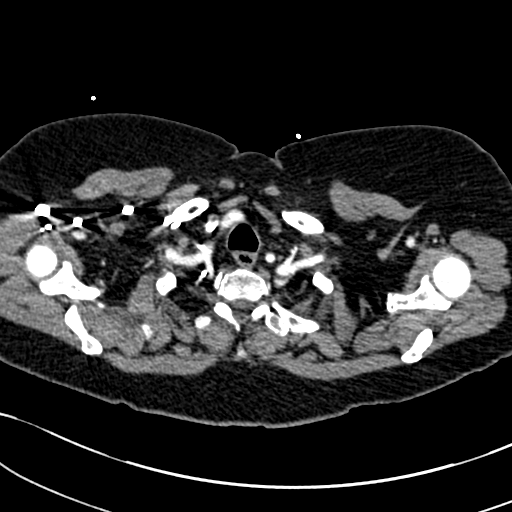
[im 272/287  lung]
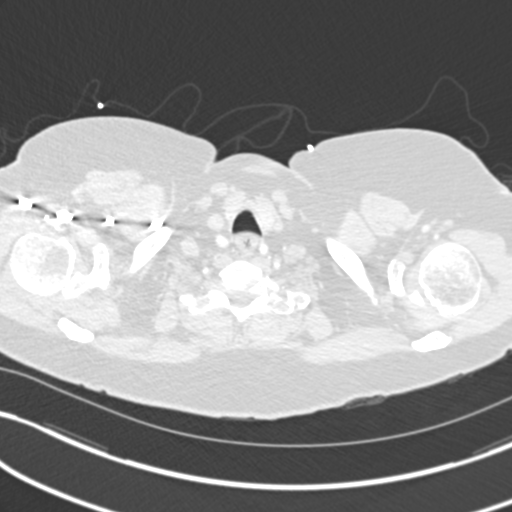

[19 of 36 positions shown; findings below may reference images not displayed]

FINDINGS: Cardiovascular: Arterial opacification is good. There are no
pulmonary emboli. No aortic atherosclerosis or acute aortic
pathology. No visible coronary artery calcification. No pericardial
fluid. Prominent epicardial fat.

Mediastinum/Nodes: No mediastinal adenopathy or mass.

Lungs/Pleura: There is dependent pulmonary atelectasis left worse
than right that could be simple atelectasis or atelectatic
pneumonia. No dense consolidation or lobar collapse. No pleural
fluid. The patient could possibly have a degree of interstitial
edema/ fluid overload. Prominent pleural fat.

Upper Abdomen: Negative.  Previous cholecystectomy.

Musculoskeletal: Negative

Review of the MIP images confirms the above findings.
IMPRESSION: No pulmonary emboli or acute systemic arterial finding.

Dependent pulmonary atelectasis/infiltrate bilaterally left worse
than right. No dense consolidation or lobar collapse. Interstitial
pattern could also be seen with some degree of fluid overload/ mild
edema.

## 2017-02-01 IMAGING — CR DG CHEST 2V
2 series · 2 of 2 positions shown · non-contrast
Comparison: [DATE]

CLINICAL DATA: Shortness of breath and chest pain

EXAM:
CHEST  2 VIEW

[chest pa]
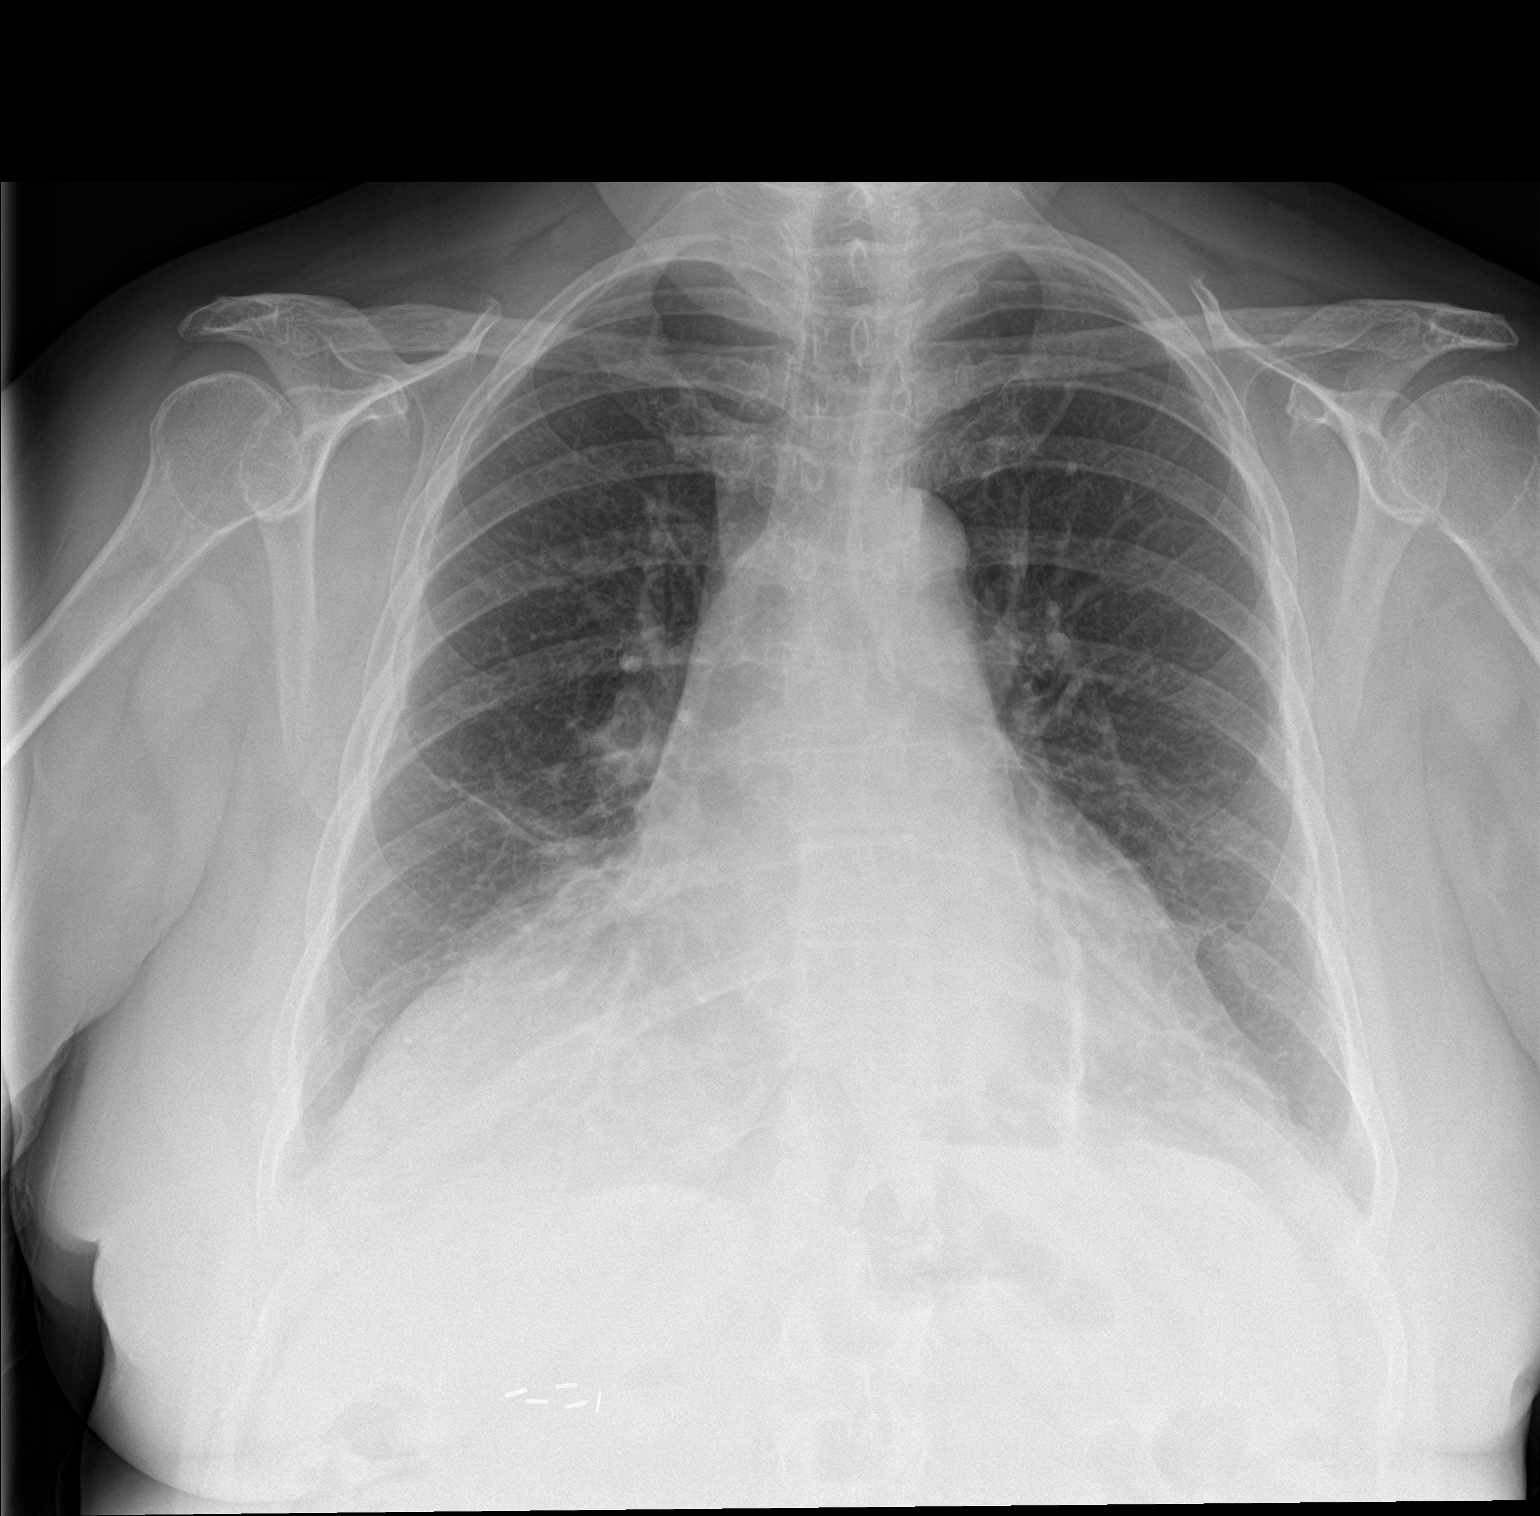

[chest lat]
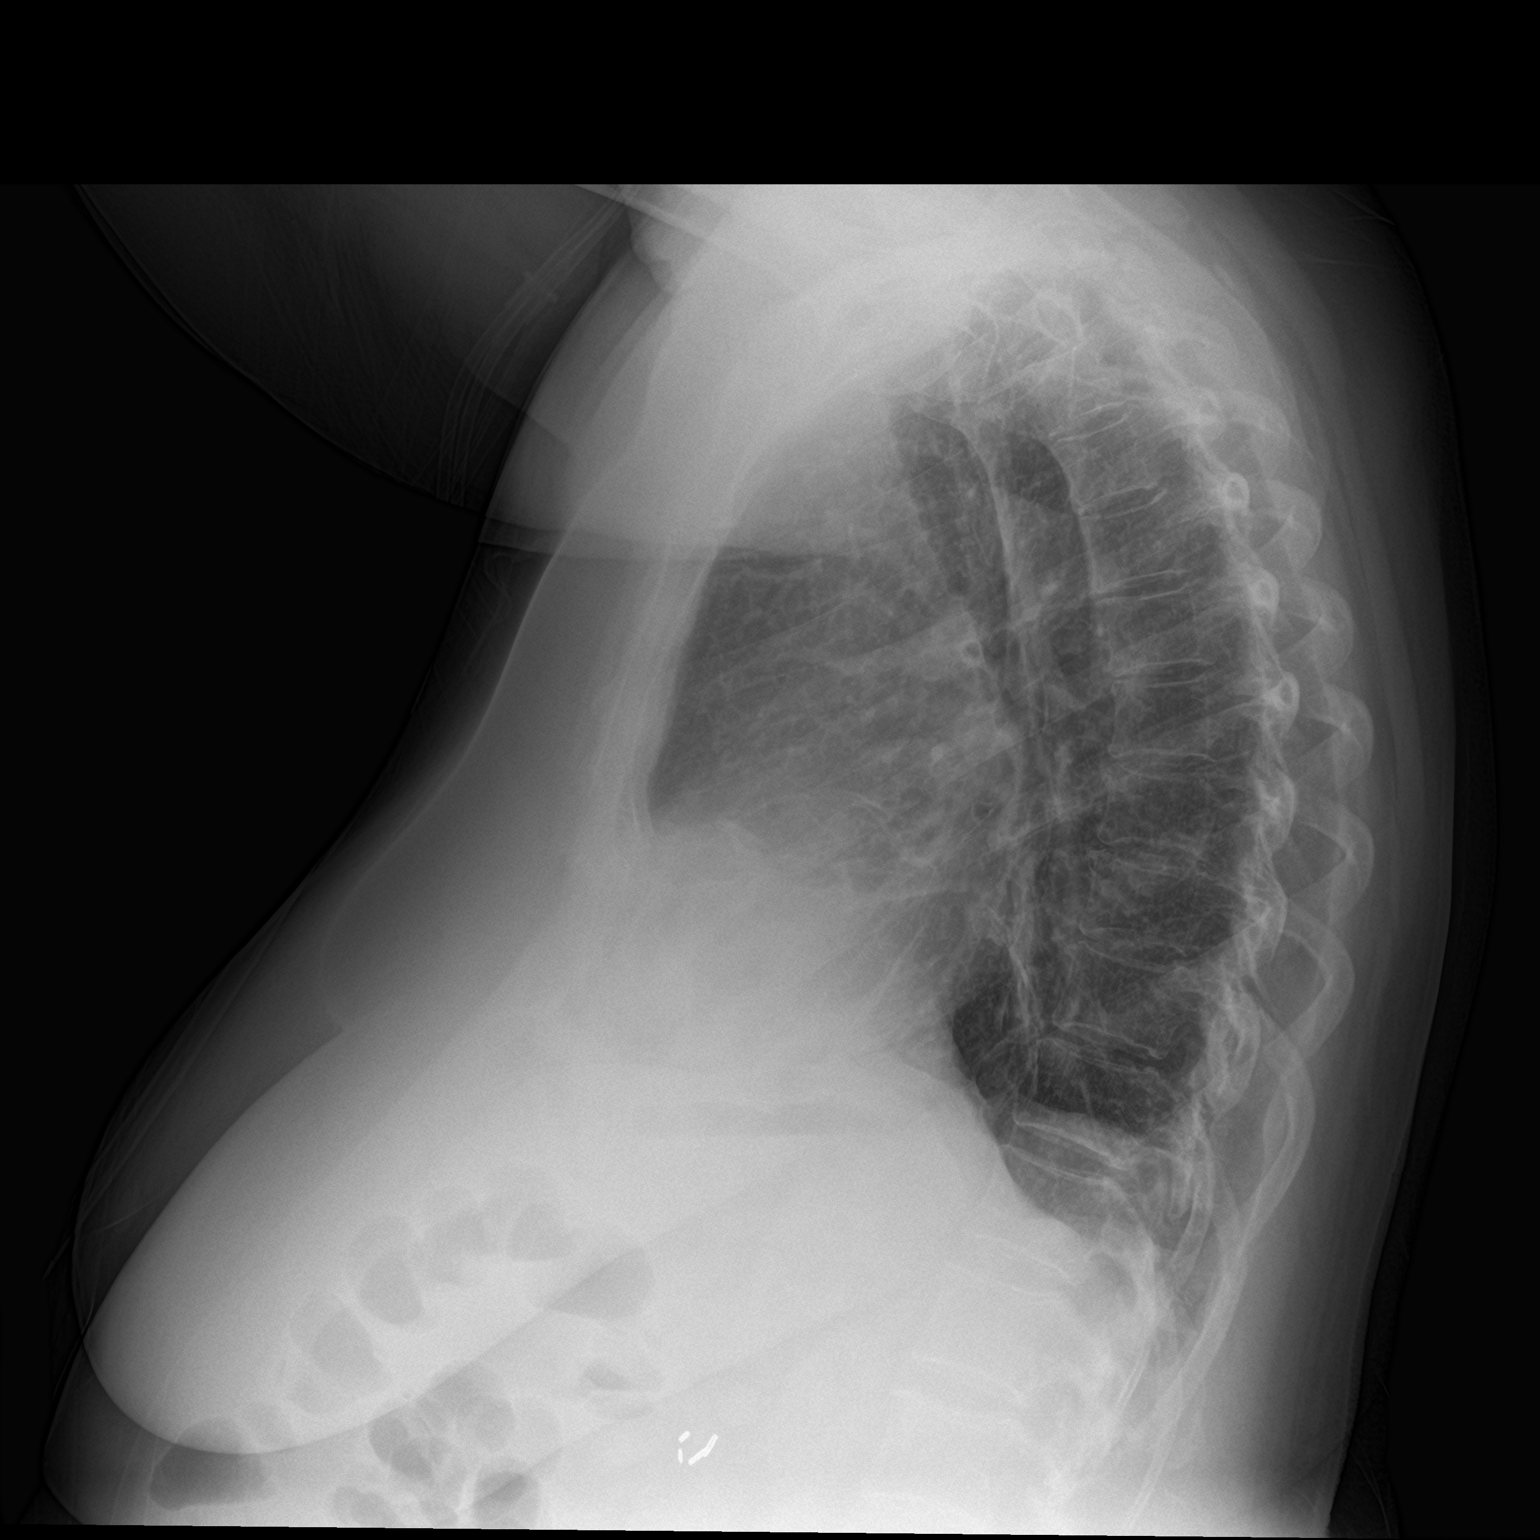

[2 of 2 positions shown; findings below may reference images not displayed]

FINDINGS: There is scarring in both lower lung zones. There is no edema or
consolidation. There is eventration of the right hemidiaphragm,
stable.

Heart is upper normal in size with pulmonary vascularity within
normal limits. No adenopathy. No bone lesions.
IMPRESSION: Areas of scarring bilaterally. No edema or consolidation. Stable
cardiac silhouette.

## 2017-02-01 MED ORDER — ZOLPIDEM TARTRATE 5 MG PO TABS
5.0000 mg | ORAL_TABLET | Freq: Every evening | ORAL | Status: DC | PRN
  Administered 2017-02-01: 5 mg via ORAL
  Filled 2017-02-01: qty 1

## 2017-02-01 MED ORDER — ACETAMINOPHEN 650 MG RE SUPP: 650.0000 mg | Freq: Four times a day (QID) | RECTAL | Status: DC | PRN

## 2017-02-01 MED ORDER — VALACYCLOVIR HCL 500 MG PO TABS
1000.0000 mg | ORAL_TABLET | Freq: Two times a day (BID) | ORAL | Status: AC
  Administered 2017-02-01 – 2017-02-02 (×2): 1000 mg via ORAL
  Filled 2017-02-01 (×2): qty 2

## 2017-02-01 MED ORDER — SODIUM CHLORIDE 0.9 % IV BOLUS (SEPSIS)
500.0000 mL | Freq: Once | INTRAVENOUS | Status: AC
  Administered 2017-02-01: 500 mL via INTRAVENOUS

## 2017-02-01 MED ORDER — VANCOMYCIN HCL IN DEXTROSE 1-5 GM/200ML-% IV SOLN: 1000.0000 mg | Freq: Once | INTRAVENOUS | Status: DC

## 2017-02-01 MED ORDER — IOPAMIDOL (ISOVUE-370) INJECTION 76%
60.0000 mL | Freq: Once | INTRAVENOUS | Status: AC | PRN
  Administered 2017-02-01: 60 mL via INTRAVENOUS

## 2017-02-01 MED ORDER — ENOXAPARIN SODIUM 40 MG/0.4ML ~~LOC~~ SOLN
40.0000 mg | SUBCUTANEOUS | Status: DC
  Administered 2017-02-01 – 2017-02-02 (×2): 40 mg via SUBCUTANEOUS
  Filled 2017-02-01 (×2): qty 0.4

## 2017-02-01 MED ORDER — BUDESONIDE 0.5 MG/2ML IN SUSP
0.5000 mg | Freq: Two times a day (BID) | RESPIRATORY_TRACT | Status: DC
  Administered 2017-02-01 – 2017-02-03 (×4): 0.5 mg via RESPIRATORY_TRACT
  Filled 2017-02-01 (×4): qty 2

## 2017-02-01 MED ORDER — BUPROPION HCL ER (SR) 150 MG PO TB12
150.0000 mg | ORAL_TABLET | Freq: Every day | ORAL | Status: DC
  Administered 2017-02-02 – 2017-02-03 (×2): 150 mg via ORAL
  Filled 2017-02-01 (×2): qty 1

## 2017-02-01 MED ORDER — ADULT MULTIVITAMIN W/MINERALS CH
1.0000 | ORAL_TABLET | Freq: Every day | ORAL | Status: DC
  Administered 2017-02-02 – 2017-02-03 (×2): 1 via ORAL
  Filled 2017-02-01 (×2): qty 1

## 2017-02-01 MED ORDER — SODIUM CHLORIDE 0.9 % IV BOLUS (SEPSIS)
1000.0000 mL | Freq: Once | INTRAVENOUS | Status: AC
  Administered 2017-02-01: 1000 mL via INTRAVENOUS

## 2017-02-01 MED ORDER — CEFTRIAXONE SODIUM-DEXTROSE 1-3.74 GM-% IV SOLR
1.0000 g | INTRAVENOUS | Status: DC
  Administered 2017-02-01: 1 g via INTRAVENOUS
  Filled 2017-02-01 (×2): qty 50

## 2017-02-01 MED ORDER — PRAVASTATIN SODIUM 20 MG PO TABS
80.0000 mg | ORAL_TABLET | Freq: Every day | ORAL | Status: DC
  Administered 2017-02-02: 80 mg via ORAL
  Filled 2017-02-01: qty 4

## 2017-02-01 MED ORDER — ONDANSETRON HCL 4 MG/2ML IJ SOLN: 4.0000 mg | Freq: Four times a day (QID) | INTRAMUSCULAR | Status: DC | PRN

## 2017-02-01 MED ORDER — VANCOMYCIN HCL IN DEXTROSE 1-5 GM/200ML-% IV SOLN
1000.0000 mg | Freq: Once | INTRAVENOUS | Status: AC
Start: 2017-02-01 — End: 2017-02-01
  Administered 2017-02-01: 1000 mg via INTRAVENOUS
  Filled 2017-02-01: qty 200

## 2017-02-01 MED ORDER — METHYLPREDNISOLONE SODIUM SUCC 125 MG IJ SOLR
60.0000 mg | Freq: Every day | INTRAMUSCULAR | Status: DC
  Administered 2017-02-01 – 2017-02-03 (×3): 60 mg via INTRAVENOUS
  Filled 2017-02-01 (×3): qty 2

## 2017-02-01 MED ORDER — ACETAMINOPHEN-CODEINE #3 300-30 MG PO TABS
1.0000 | ORAL_TABLET | ORAL | Status: DC | PRN
  Administered 2017-02-01 – 2017-02-02 (×3): 1 via ORAL
  Filled 2017-02-01 (×3): qty 1

## 2017-02-01 MED ORDER — PROMETHAZINE HCL 25 MG RE SUPP
25.0000 mg | Freq: Four times a day (QID) | RECTAL | Status: DC | PRN
  Administered 2017-02-01: 21:00:00 25 mg via RECTAL
  Filled 2017-02-01 (×2): qty 1

## 2017-02-01 MED ORDER — NYSTATIN-TRIAMCINOLONE 100000-0.1 UNIT/GM-% EX CREA
1.0000 "application " | TOPICAL_CREAM | Freq: Two times a day (BID) | CUTANEOUS | Status: DC | PRN
  Filled 2017-02-01: qty 15

## 2017-02-01 MED ORDER — ALPRAZOLAM 0.5 MG PO TABS
0.5000 mg | ORAL_TABLET | Freq: Two times a day (BID) | ORAL | Status: DC | PRN
Start: 2017-02-01 — End: 2017-02-03
  Administered 2017-02-01 – 2017-02-02 (×2): 0.5 mg via ORAL
  Filled 2017-02-01 (×2): qty 1

## 2017-02-01 MED ORDER — ONDANSETRON HCL 4 MG/2ML IJ SOLN
INTRAMUSCULAR | Status: AC
  Filled 2017-02-01: qty 2

## 2017-02-01 MED ORDER — TRAZODONE HCL 100 MG PO TABS
100.0000 mg | ORAL_TABLET | Freq: Every day | ORAL | Status: DC
  Administered 2017-02-01 – 2017-02-02 (×2): 100 mg via ORAL
  Filled 2017-02-01 (×2): qty 1

## 2017-02-01 MED ORDER — IPRATROPIUM-ALBUTEROL 0.5-2.5 (3) MG/3ML IN SOLN
3.0000 mL | Freq: Once | RESPIRATORY_TRACT | Status: AC
  Administered 2017-02-01: 3 mL via RESPIRATORY_TRACT
  Filled 2017-02-01: qty 3

## 2017-02-01 MED ORDER — IPRATROPIUM-ALBUTEROL 0.5-2.5 (3) MG/3ML IN SOLN
3.0000 mL | Freq: Four times a day (QID) | RESPIRATORY_TRACT | Status: DC
  Administered 2017-02-01 – 2017-02-02 (×4): 3 mL via RESPIRATORY_TRACT
  Filled 2017-02-01 (×4): qty 3

## 2017-02-01 MED ORDER — ACETAMINOPHEN 325 MG PO TABS
650.0000 mg | ORAL_TABLET | Freq: Four times a day (QID) | ORAL | Status: DC | PRN
  Administered 2017-02-01: 650 mg via ORAL
  Filled 2017-02-01: qty 2

## 2017-02-01 MED ORDER — ONDANSETRON HCL 4 MG/2ML IJ SOLN
4.0000 mg | Freq: Once | INTRAMUSCULAR | Status: AC
  Administered 2017-02-01: 4 mg via INTRAVENOUS

## 2017-02-01 MED ORDER — DEXTROSE 5 % IV SOLN: 1.0000 g | INTRAVENOUS | Status: DC

## 2017-02-01 MED ORDER — AZITHROMYCIN 500 MG PO TABS
500.0000 mg | ORAL_TABLET | Freq: Every day | ORAL | Status: AC
  Administered 2017-02-02: 02:00:00 500 mg via ORAL
  Filled 2017-02-01: qty 1

## 2017-02-01 MED ORDER — DULOXETINE HCL 60 MG PO CPEP
60.0000 mg | ORAL_CAPSULE | Freq: Every day | ORAL | Status: DC
  Administered 2017-02-02 – 2017-02-03 (×2): 60 mg via ORAL
  Filled 2017-02-01 (×2): qty 1

## 2017-02-01 MED ORDER — AZITHROMYCIN 250 MG PO TABS
250.0000 mg | ORAL_TABLET | Freq: Every day | ORAL | Status: DC
  Administered 2017-02-02 – 2017-02-03 (×2): 250 mg via ORAL
  Filled 2017-02-01 (×2): qty 1

## 2017-02-01 MED ORDER — ONDANSETRON HCL 4 MG PO TABS
4.0000 mg | ORAL_TABLET | Freq: Four times a day (QID) | ORAL | Status: DC | PRN
  Administered 2017-02-01: 4 mg via ORAL
  Filled 2017-02-01: qty 1

## 2017-02-01 MED ORDER — PANTOPRAZOLE SODIUM 40 MG PO TBEC
40.0000 mg | DELAYED_RELEASE_TABLET | Freq: Two times a day (BID) | ORAL | Status: DC
  Administered 2017-02-01 – 2017-02-03 (×4): 40 mg via ORAL
  Filled 2017-02-01 (×4): qty 1

## 2017-02-01 MED ORDER — CEFEPIME-DEXTROSE 1 GM/50ML IV SOLR
1.0000 g | Freq: Once | INTRAVENOUS | Status: AC
  Administered 2017-02-01: 1 g via INTRAVENOUS
  Filled 2017-02-01: qty 50

## 2017-02-01 MED ORDER — SODIUM CHLORIDE 0.9 % IV SOLN
INTRAVENOUS | Status: DC
  Administered 2017-02-01 – 2017-02-03 (×3): via INTRAVENOUS

## 2017-02-01 NOTE — ED Notes (Addendum)
Pt finished neb and stood up to ambulate without oxygen - o2 sat decreaed to 88% just standing - ambulated approx 15 feet and o2 sat decreased to 86% - pt BP also decreased and heart rate increased upon standing - see validated results - pt ambulated back to bed and Dr Reita Cliche notified

## 2017-02-01 NOTE — ED Notes (Signed)
Pt states she is having chest pain behinid left breast and short of breath - pt is on 2nd round of atb and has been sick for 3 weeks with cold symptoms - pt states last night pain was radiating up into throat

## 2017-02-01 NOTE — H&P (Addendum)
[name]    MR#:  [id number]  DATE OF BIRTH:  Nov 28, 1946  DATE OF ADMISSION:  02/01/2017  PRIMARY CARE PHYSICIAN: [name], MD   REQUESTING/REFERRING PHYSICIAN: Dr Lisa Roca  CHIEF COMPLAINT:   Chief Complaint  Patient presents with  . Chest Pain  . Cough  . Shortness of Breath    HISTORY OF PRESENT ILLNESS:  Sara Mcdonald  is a 71 y.o. female presents to the hospital not feeling well. She's been coughing up greenish phlegm. She has been weak, dizzy and no appetite. Last night she developed chest pain on her left breast up into her neck and her oxygen saturations were 90% at home. Going back to February 4 she's not been feeling well she was told that she had the flu and was given course of Levaquin. She will return for follow-up and given 7 days of prednisone. Couple days ago she was given Ceftin. Her oxygen levels were low and she came into the hospital for further evaluation. In the ER, pulse ox was 84% with ambulation and she was borderline hypotensive initially responded to fluid boluses.  PAST MEDICAL HISTORY:   Past Medical History:  Diagnosis Date  . Anxiety   . Depression   . Diabetes mellitus without complication ([place])   . Elevated lipids   . Fibromyalgia   . Headache   . Hypertension   . OA (osteoarthritis)   . Sleep apnea     PAST SURGICAL HISTORY:   Past Surgical History:  Procedure Laterality Date  . ABDOMINAL HYSTERECTOMY    . APPENDECTOMY    . CHOLECYSTECTOMY    . COLONOSCOPY    . COLONOSCOPY WITH PROPOFOL N/A 07/07/2016   Procedure: COLONOSCOPY WITH PROPOFOL;  Surgeon: Manya Silvas, MD;  Location: [facility] ENDOSCOPY;  Service: Endoscopy;  Laterality: N/A;  . ESOPHAGOGASTRODUODENOSCOPY    . FLEXIBLE SIGMOIDOSCOPY    . nanoflex Bilateral     SOCIAL HISTORY:   Social History  Substance Use Topics  . Smoking status: Never Smoker  . Smokeless tobacco: Never  Used  . Alcohol use No    FAMILY HISTORY:   Family History  Problem Relation Age of Onset  . Breast cancer Paternal Aunt 50  . CVA Mother   . Hypertension Mother   . COPD Father   . CAD Father   . Diabetes Father     DRUG ALLERGIES:   Allergies  Allergen Reactions  . Lipitor [Atorvastatin]   . Penicillins   . Sulfur   . Tricor [Fenofibrate]     REVIEW OF SYSTEMS:  CONSTITUTIONAL: No fever, Positive for sweating. Positive for fatigue and weakness. Positive for weight loss EYES: No blurred or double vision.  EARS, NOSE, AND THROAT: No tinnitus or ear pain. No sore throat. Positive for lowing her nose RESPIRATORY: Positive for cough, shortness of breath, and wheezing. No hemoptysis.  CARDIOVASCULAR: Positive for left-sided chest pain, no orthopnea, edema.  GASTROINTESTINAL: Positive for nausea, and left-sided abdominal pain. No blood in bowel movements. No diarrhea or constipation GENITOURINARY: No dysuria, hematuria.  ENDOCRINE: No polyuria, nocturia,  HEMATOLOGY: No anemia, easy bruising or bleeding SKIN: No rash or lesion. MUSCULOSKELETAL: Pain all over with fibromyalgia   NEUROLOGIC: No tingling, numbness, weakness.  PSYCHIATRY: No anxiety or depression.   MEDICATIONS AT HOME:   Prior to Admission medications   Medication Sig Start Date End Date Taking? Authorizing Provider  acetaminophen-codeine (TYLENOL #3) 300-30  MG tablet Take by mouth every 4 (four) hours as needed for moderate pain.   Yes Historical Provider, MD  ALPRAZolam Duanne Moron) 0.5 MG tablet Take 0.5 mg by mouth 2 (two) times daily as needed for sleep. 01/11/17  Yes Historical Provider, MD  buPROPion (WELLBUTRIN SR) 150 MG 12 hr tablet Take 150 mg by mouth daily.    Yes Historical Provider, MD  cefUROXime (CEFTIN) 500 MG tablet Take 500 mg by mouth 2 (two) times daily. 01/30/17 02/06/17 Yes Historical Provider, MD  DULoxetine (CYMBALTA) 60 MG capsule Take 60 mg by mouth daily.   Yes Historical Provider, MD   lisinopril (PRINIVIL,ZESTRIL) 10 MG tablet Take 10 mg by mouth daily.   Yes Historical Provider, MD  metFORMIN (GLUCOPHAGE) 500 MG tablet Take 500 mg by mouth daily.    Yes Historical Provider, MD  Multiple Vitamin (MULTIVITAMIN) capsule Take 1 capsule by mouth daily.   Yes Historical Provider, MD  nystatin-triamcinolone (MYCOLOG II) cream Apply 1 application topically 2 (two) times daily.   Yes Historical Provider, MD  pantoprazole (PROTONIX) 40 MG tablet Take 40 mg by mouth 2 (two) times daily.    Yes Historical Provider, MD  pravastatin (PRAVACHOL) 80 MG tablet Take 80 mg by mouth daily.   Yes Historical Provider, MD  predniSONE (DELTASONE) 20 MG tablet Take 20 mg by mouth. 30 mg for 2 days, 20 mg for 2 days, 10 mg for 2 days then 5 mg for 2 days. 01/23/17  Yes Historical Provider, MD  promethazine (PHENERGAN) 25 MG suppository Place 25 mg rectally every 6 (six) hours as needed for nausea or vomiting.   Yes Historical Provider, MD  rizatriptan (MAXALT) 10 MG tablet Take 10 mg by mouth every 2 (two) hours as needed. 12/30/16  Yes Historical Provider, MD  traZODone (DESYREL) 100 MG tablet Take 100 mg by mouth at bedtime.   Yes Historical Provider, MD  triamterene-hydrochlorothiazide (MAXZIDE-25) 37.5-25 MG tablet Take 1 tablet by mouth daily.   Yes Historical Provider, MD  zolpidem (AMBIEN) 10 MG tablet Take 10 mg by mouth at bedtime as needed for sleep.   Yes Historical Provider, MD      VITAL SIGNS:  Blood pressure 135/63, pulse (!) 109, temperature 97.8 F (36.6 C), temperature source Oral, resp. rate 16, height 4\' 11"  (1.499 m), weight 77.1 kg (170 lb), SpO2 99 %.  PHYSICAL EXAMINATION:  GENERAL:  71 y.o.-year-old patient lying in the bed with no acute distress.  EYES: Pupils equal, round, reactive to light and accommodation. No scleral icterus. Extraocular muscles intact.  HEENT: Head atraumatic, normocephalic. Oropharynx and nasopharynx clear.  NECK:  Supple, no jugular venous  distention. No thyroid enlargement, no tenderness.  LUNGS: Decreased breath sounds bilaterally, expiratory wheezing. No rales,rhonchi or crepitation. No use of accessory muscles of respiration.  CARDIOVASCULAR: S1, S2 normal. No murmurs, rubs, or gallops.  ABDOMEN: Soft, nontender, nondistended. Bowel sounds present. No organomegaly or mass.  EXTREMITIES: Trace edema, no cyanosis, or clubbing.  NEUROLOGIC: Cranial nerves II through XII are intact. Muscle strength 5/5 in all extremities. Sensation intact. Gait not checked.  PSYCHIATRIC: The patient is alert and oriented x 3.  SKIN: No rash, lesion, or ulcer.   LABORATORY PANEL:   CBC  Recent Labs Lab 02/01/17 1134  WBC 12.2*  HGB 13.8  HCT 40.1  PLT 270   ------------------------------------------------------------------------------------------------------------------  Chemistries   Recent Labs Lab 02/01/17 1134  NA 130*  K 4.0  CL 96*  CO2 27  GLUCOSE 210*  BUN 20  CREATININE 1.19*  CALCIUM 9.9   ------------------------------------------------------------------------------------------------------------------  Cardiac Enzymes  Recent Labs Lab 02/01/17 1134  TROPONINI <0.03   ------------------------------------------------------------------------------------------------------------------  RADIOLOGY:  Dg Chest 2 View  Result Date: 02/01/2017 CLINICAL DATA:  Shortness of breath and chest pain EXAM: CHEST  2 VIEW COMPARISON:  August 11, 2014 FINDINGS: There is scarring in both lower lung zones. There is no edema or consolidation. There is eventration of the right hemidiaphragm, stable. Heart is upper normal in size with pulmonary vascularity within normal limits. No adenopathy. No bone lesions. IMPRESSION: Areas of scarring bilaterally. No edema or consolidation. Stable cardiac silhouette. Electronically Signed   By: Lowella Grip III M.D.   On: 02/01/2017 12:33   Ct Angio Chest Pe W/cm &/or Wo Cm  Result Date:  02/01/2017 CLINICAL DATA:  Influenza diagnosis 3 weeks ago. Left-sided chest pain, productive cough and shortness of breath. EXAM: CT ANGIOGRAPHY CHEST WITH CONTRAST TECHNIQUE: Multidetector CT imaging of the chest was performed using the standard protocol during bolus administration of intravenous contrast. Multiplanar CT image reconstructions and MIPs were obtained to evaluate the vascular anatomy. CONTRAST:  75 cc Isovue 370 COMPARISON:  Chest radiography same day FINDINGS: Cardiovascular: Arterial opacification is good. There are no pulmonary emboli. No aortic atherosclerosis or acute aortic pathology. No visible coronary artery calcification. No pericardial fluid. Prominent epicardial fat. Mediastinum/Nodes: No mediastinal adenopathy or mass. Lungs/Pleura: There is dependent pulmonary atelectasis left worse than right that could be simple atelectasis or atelectatic pneumonia. No dense consolidation or lobar collapse. No pleural fluid. The patient could possibly have a degree of interstitial edema/ fluid overload. Prominent pleural fat. Upper Abdomen: Negative.  Previous cholecystectomy. Musculoskeletal: Negative Review of the MIP images confirms the above findings. IMPRESSION: No pulmonary emboli or acute systemic arterial finding. Dependent pulmonary atelectasis/infiltrate bilaterally left worse than right. No dense consolidation or lobar collapse. Interstitial pattern could also be seen with some degree of fluid overload/ mild edema. Electronically Signed   By: Nelson Chimes M.D.   On: 02/01/2017 16:33    EKG:   Sinus tachycardia 109 bpm  IMPRESSION AND PLAN:   1.  Clinical sepsis. Suspected pneumonia, tachycardia, leukocytosis and hypotension. Acute respiratory failure with hypoxia. Continue oxygen supplementation Continue IV fluid hydration. Received a few fluid boluses in the ER. Switch antibiotics to Rocephin and Zithromax. Patient has an asthmatic bronchitis component need Solu-Medrol, DuoNeb  nebulizers and budesonide nebulizers. 2. Lactic acidosis hold Glucophage 3. Relative hypotension. Hold antihypertensives at this time and give IV fluids 4. Hyperlipidemia unspecified continue statin 5. Type 2 diabetes mellitus sugars will be high on steroids put on sliding scale 6. Chronic kidney disease stage III 7. History of fibromyalgia 8. Depression and anxiety continue psychiatric medications  All the records are reviewed and case discussed with ED provider. Management plans discussed with the patient, family and they are in agreement.  CODE STATUS: DO NOT RESUSCITATE  TOTAL TIME TAKING CARE OF THIS PATIENT: 50 minutes.    Loletha Grayer M.D on 02/01/2017 at 6:20 PM  Between 7am to 6pm - Pager - [phone]  After 6pm call admission pager [phone]  [facility]  [phone]  CC: Primary care physician; [name], MD

## 2017-02-01 NOTE — ED Notes (Signed)
Patient transported to CT 

## 2017-02-01 NOTE — ED Triage Notes (Signed)
Pt reports she had the flu about 3 weeks ago, last night she began having chest pain to left side of chest, pt reports since the flu she has had a productive cough and sob.

## 2017-02-01 NOTE — ED Notes (Signed)
This nurse was notified of bed ready status - will call report at 720pm after shift change

## 2017-02-01 NOTE — ED Provider Notes (Signed)
[facility] Emergency Department Provider Note ____________________________________________   I have reviewed the triage vital signs and the triage nursing note.  HISTORY  Chief Complaint Chest Pain; Cough; and Shortness of Breath   Historian Patient and daughter and daughter in law  HPI Sara Mcdonald is a 71 y.o. female with a history of diabetes, fibromyalgia, hypertension, sleep apnea, without known history of stroke or coronary artery disease, but a history of recurrent pleurisy for the patient, presents today with chest pain left-sided since yesterday located across the top of the left breast and mid left chest wall. Pain is moderate. It is somewhat worse with taking a deep breath. She has been sick with cough and congestion and shortness of breath for about 3 weeks now. She was clinically diagnosed with the flu, but was not placed on Tamiflu at that time. She had persistent symptoms and apparently took a course of antibiotics which she thinks was Levaquin. Yesterday she called her primary doctor, Dr. Sabra Heck, who started her on an antibiotic that starts with a "C". She's had one dose of the new antibiotic which she thinks is Ceftin.  No fevers. Pain in her left chest is what essentially brought her in today. However she is quite concerned about her persistent respiratory symptoms as well.  Reports that she is also on prednisone. No known history of pulmonary issues such as asthma or COPD. Taking an inhaler.    Past Medical History:  Diagnosis Date  . Diabetes mellitus without complication ([place])   . Elevated lipids   . Fibromyalgia   . Headache   . Hypertension   . OA (osteoarthritis)   . Sleep apnea     There are no active problems to display for this patient.   Past Surgical History:  Procedure Laterality Date  . ABDOMINAL HYSTERECTOMY    . APPENDECTOMY    . CHOLECYSTECTOMY    . COLONOSCOPY    . COLONOSCOPY WITH PROPOFOL N/A 07/07/2016   Procedure: COLONOSCOPY WITH PROPOFOL;  Surgeon: Manya Silvas, MD;  Location: [facility] ENDOSCOPY;  Service: Endoscopy;  Laterality: N/A;  . ESOPHAGOGASTRODUODENOSCOPY    . FLEXIBLE SIGMOIDOSCOPY    . nanoflex Bilateral     Prior to Admission medications   Medication Sig Start Date End Date Taking? Authorizing Provider  acetaminophen-codeine (TYLENOL #3) 300-30 MG tablet Take by mouth every 4 (four) hours as needed for moderate pain.   Yes Historical Provider, MD  ALPRAZolam Duanne Moron) 0.5 MG tablet Take 0.5 mg by mouth 2 (two) times daily as needed for sleep. 01/11/17  Yes Historical Provider, MD  buPROPion (WELLBUTRIN SR) 150 MG 12 hr tablet Take 150 mg by mouth daily.    Yes Historical Provider, MD  cefUROXime (CEFTIN) 500 MG tablet Take 500 mg by mouth 2 (two) times daily. 01/30/17 02/06/17 Yes Historical Provider, MD  DULoxetine (CYMBALTA) 60 MG capsule Take 60 mg by mouth daily.   Yes Historical Provider, MD  lisinopril (PRINIVIL,ZESTRIL) 10 MG tablet Take 10 mg by mouth daily.   Yes Historical Provider, MD  metFORMIN (GLUCOPHAGE) 500 MG tablet Take 500 mg by mouth daily.    Yes Historical Provider, MD  Multiple Vitamin (MULTIVITAMIN) capsule Take 1 capsule by mouth daily.   Yes Historical Provider, MD  nystatin-triamcinolone (MYCOLOG II) cream Apply 1 application topically 2 (two) times daily.   Yes Historical Provider, MD  pantoprazole (PROTONIX) 40 MG tablet Take 40 mg by mouth 2 (two) times daily.    Yes Historical Provider,  MD  pravastatin (PRAVACHOL) 80 MG tablet Take 80 mg by mouth daily.   Yes Historical Provider, MD  predniSONE (DELTASONE) 20 MG tablet Take 20 mg by mouth. 30 mg for 2 days, 20 mg for 2 days, 10 mg for 2 days then 5 mg for 2 days. 01/23/17  Yes Historical Provider, MD  promethazine (PHENERGAN) 25 MG suppository Place 25 mg rectally every 6 (six) hours as needed for nausea or vomiting.   Yes Historical Provider, MD  rizatriptan (MAXALT) 10 MG tablet Take 10 mg by mouth every  2 (two) hours as needed. 12/30/16  Yes Historical Provider, MD  traZODone (DESYREL) 100 MG tablet Take 100 mg by mouth at bedtime.   Yes Historical Provider, MD  triamterene-hydrochlorothiazide (MAXZIDE-25) 37.5-25 MG tablet Take 1 tablet by mouth daily.   Yes Historical Provider, MD  zolpidem (AMBIEN) 10 MG tablet Take 10 mg by mouth at bedtime as needed for sleep.   Yes Historical Provider, MD    Allergies  Allergen Reactions  . Lipitor [Atorvastatin]   . Penicillins   . Sulfur   . Tricor [Fenofibrate]     Family History  Problem Relation Age of Onset  . Breast cancer Paternal Aunt 18    Social History Social History  Substance Use Topics  . Smoking status: Never Smoker  . Smokeless tobacco: Never Used  . Alcohol use No    Review of Systems  Constitutional: Negative for fever. Eyes: Negative for visual changes. ENT: Negative for sore throat. Cardiovascular: Positive for chest pain. Respiratory: Positive for shortness of breath. Gastrointestinal: Negative for abdominal pain, vomiting and diarrhea. Genitourinary: Negative for dysuria. Musculoskeletal: Negative for back pain. Skin: Negative for rash. Neurological: Negative for headache. 10 point Review of Systems otherwise negative ____________________________________________   PHYSICAL EXAM:  VITAL SIGNS: ED Triage Vitals  Enc Vitals Group     BP 02/01/17 1130 (!) 121/59     Pulse Rate 02/01/17 1130 (!) 107     Resp 02/01/17 1130 20     Temp 02/01/17 1130 97.8 F (36.6 C)     Temp Source 02/01/17 1130 Oral     SpO2 02/01/17 1130 92 %     Weight 02/01/17 1131 170 lb (77.1 kg)     Height 02/01/17 1131 4\' 11"  (1.499 m)     Head Circumference --      Peak Flow --      Pain Score 02/01/17 1131 8     Pain Loc --      Pain Edu? --      Excl. in [place]? --      Constitutional: Alert and oriented. Well appearing and in no distress. HEENT   Head: Normocephalic and atraumatic.      Eyes: Conjunctivae are  normal. PERRL. Normal extraocular movements.      Ears:         Nose: No congestion/rhinnorhea.   Mouth/Throat: Mucous membranes are moist.   Neck: No stridor. Cardiovascular/Chest: Normal rate, regular rhythm.  No murmurs, rubs, or gallops. Respiratory: Normal respiratory effort without tachypnea nor retractions. Breath sounds are clear and equal bilaterally. Mildly decreased air movement throughout, without obvious wheezing, rales or rhonchi. Gastrointestinal: Soft. No distention, no guarding, no rebound. Nontender.    Genitourinary/rectal:Deferred Musculoskeletal: Nontender with normal range of motion in all extremities. No joint effusions.  No lower extremity tenderness.  No edema. Neurologic:  Normal speech and language. No gross or focal neurologic deficits are appreciated. Skin:  Skin is warm, dry  and intact. No rash noted. Psychiatric: Mood and affect are normal. Speech and behavior are normal. Patient exhibits appropriate insight and judgment.   ____________________________________________  LABS (pertinent positives/negatives)  Labs Reviewed  BASIC METABOLIC PANEL - Abnormal; Notable for the following:       Result Value   Sodium 130 (*)    Chloride 96 (*)    Glucose, Bld 210 (*)    Creatinine, Ser 1.19 (*)    GFR calc non Af Amer 45 (*)    GFR calc Af Amer 52 (*)    All other components within normal limits  CBC - Abnormal; Notable for the following:    WBC 12.2 (*)    All other components within normal limits  LACTIC ACID, PLASMA - Abnormal; Notable for the following:    Lactic Acid, Venous 2.0 (*)    All other components within normal limits  CULTURE, BLOOD (ROUTINE X 2)  CULTURE, BLOOD (ROUTINE X 2)  URINE CULTURE  TROPONIN I  INFLUENZA PANEL BY PCR (TYPE A & B)  LACTIC ACID, PLASMA  URINALYSIS, COMPLETE (UACMP) WITH MICROSCOPIC    ____________________________________________    EKG I, Lisa Roca, MD, the attending physician have personally viewed  and interpreted all ECGs.  109 bpm. Sinus tachycardia. Narrow QRS. Normal axis. Normal ST and T-wave ____________________________________________  RADIOLOGY All Xrays were viewed by me. Imaging interpreted by Radiologist.  Chest x-ray: Areas of scarring bilaterally. No edema or consolidation. Stable cardiac silhouette.  CT angiogram for PE:  IMPRESSION: No pulmonary emboli or acute systemic arterial finding.  Dependent pulmonary atelectasis/infiltrate bilaterally left worse than right. No dense consolidation or lobar collapse. Interstitial pattern could also be seen with some degree of fluid overload/ mild edema. __________________________________________  PROCEDURES  Procedure(s) performed: None  Critical Care performed: None  ____________________________________________   ED COURSE / ASSESSMENT AND PLAN  Pertinent labs & imaging results that were available during my care of the patient were reviewed by me and considered in my medical decision making (see chart for details).   Sara Mcdonald is overall well-appearing, but she looks like she is just frustrated of being sick and is worried about the left sided chest pain which is different than she's experienced before. Her EKG is reassuring as is her troponin. She's had essentially ongoing chest pain since last night, and although there is any clinical utility for repeat troponin in this case.  Chest x-ray is clear for no obvious infiltrate. The there is somewhat pleuritic component, and given that plus tachycardia, but is low O2 sat 92% on room air, I discussed ruling out pulmonary embolism with CT. This would also give a look at the lungs in case there is evidence of early pneumonia there despite the recent antibiotic courses.  Patient and family wanted to proceed even after discussion of risks versus benefit of the CT contrast dye administration as well as radiation exposure.  She is 95% on 2 L nasal cannula. Given a somewhat  bronchospastic cough, I'm going to try a DuoNeb and check a walking O2 sat as well.  O2 sat with walking drop to 86%, heart rate went up to about 110 and blood pressure dropped into the 80s. Upon being helped back up she did have improvement in blood pressure. She still little tachycardic. Based off of this I am placing her under sepsis protocol and I am treating her with antibiotics for undifferentiated sepsis although I suspect respiratory is probably the source. She reports no problem with cephalosporins,  and patient will be given vancomycin and cefepime.  CT chest showed no PE, no focal consolidation. In any case, patient will be admitted for further management.    CONSULTATIONS:   Hospitalist for admission.   Patient / Family / Caregiver informed of clinical course, medical decision-making process, and agree with plan.   ___________________________________________   FINAL CLINICAL IMPRESSION(S) / ED DIAGNOSES   Final diagnoses:  Sepsis, due to unspecified organism ([place])  Hypoxia  Hypotension, unspecified hypotension type              Note: This dictation was prepared with Dragon dictation. Any transcriptional errors that result from this process are unintentional    Lisa Roca, MD 02/01/17 1732

## 2017-02-01 NOTE — ED Notes (Signed)
Pt c/o nausea.  

## 2017-02-01 NOTE — ED Notes (Signed)
Elevated lactic acid reported to Dr Reita Cliche

## 2017-02-02 ENCOUNTER — Inpatient Hospital Stay
Admit: 2017-02-02 | Discharge: 2017-02-02 | Disposition: A | Discharge status: Home / Self Care | Payer: PPO | Attending: Specialist | Admitting: Specialist

## 2017-02-02 LAB — BASIC METABOLIC PANEL
ANION GAP: 8 (ref 5–15)
BUN: 16 mg/dL (ref 6–20)
CHLORIDE: 104 mmol/L (ref 101–111)
CO2: 23 mmol/L (ref 22–32)
Calcium: 8.2 mg/dL — ABNORMAL LOW (ref 8.9–10.3)
Creatinine, Ser: 1.04 mg/dL — ABNORMAL HIGH (ref 0.44–1.00)
GFR calc Af Amer: >60 mL/min (ref >60)
GFR calc non Af Amer: 53 mL/min — ABNORMAL LOW (ref >60)
GLUCOSE: 193 mg/dL — AB (ref 65–99)
POTASSIUM: 4.1 mmol/L (ref 3.5–5.1)
Sodium: 135 mmol/L (ref 135–145)

## 2017-02-02 LAB — CBC
HEMATOCRIT: 33.9 % — AB (ref 35.0–47.0)
HEMOGLOBIN: 11.6 g/dL — AB (ref 12.0–16.0)
MCH: 29.9 pg (ref 26.0–34.0)
MCHC: 34.1 g/dL (ref 32.0–36.0)
MCV: 87.7 fL (ref 80.0–100.0)
Platelets: 200 10*3/uL (ref 150–440)
RBC: 3.87 MIL/uL (ref 3.80–5.20)
RDW: 12.4 % (ref 11.5–14.5)
WBC: 9.8 10*3/uL (ref 3.6–11.0)

## 2017-02-02 LAB — GLUCOSE, CAPILLARY
GLUCOSE-CAPILLARY: 153 mg/dL — AB (ref 65–99)
GLUCOSE-CAPILLARY: 147 mg/dL — AB (ref 65–99)
GLUCOSE-CAPILLARY: 164 mg/dL — AB (ref 65–99)
Glucose-Capillary: 172 mg/dL — ABNORMAL HIGH (ref 65–99)
Glucose-Capillary: 131 mg/dL — ABNORMAL HIGH (ref 65–99)

## 2017-02-02 MED ORDER — TRAMADOL HCL 50 MG PO TABS
50.0000 mg | ORAL_TABLET | Freq: Four times a day (QID) | ORAL | Status: DC | PRN
  Administered 2017-02-02 – 2017-02-03 (×3): 50 mg via ORAL
  Filled 2017-02-02 (×3): qty 1

## 2017-02-02 MED ORDER — INSULIN ASPART 100 UNIT/ML ~~LOC~~ SOLN
0.0000 [IU] | Freq: Three times a day (TID) | SUBCUTANEOUS | Status: DC
Start: 2017-02-02 — End: 2017-02-03
  Administered 2017-02-02: 1 [IU] via SUBCUTANEOUS
  Administered 2017-02-02 (×2): 2 [IU] via SUBCUTANEOUS
  Administered 2017-02-03: 09:00:00 1 [IU] via SUBCUTANEOUS
  Filled 2017-02-02 (×2): qty 1
  Filled 2017-02-02 (×2): qty 2

## 2017-02-02 MED ORDER — DEXTROSE 5 % IV SOLN
1.0000 g | INTRAVENOUS | Status: DC
  Administered 2017-02-02: 1 g via INTRAVENOUS
  Filled 2017-02-02 (×2): qty 10

## 2017-02-02 MED ORDER — INSULIN ASPART 100 UNIT/ML ~~LOC~~ SOLN: 0.0000 [IU] | Freq: Every day | SUBCUTANEOUS | Status: DC

## 2017-02-02 MED ORDER — IPRATROPIUM-ALBUTEROL 0.5-2.5 (3) MG/3ML IN SOLN: 3.0000 mL | Freq: Four times a day (QID) | RESPIRATORY_TRACT | Status: DC | PRN

## 2017-02-02 MED ORDER — IBUPROFEN 600 MG PO TABS
800.0000 mg | ORAL_TABLET | Freq: Three times a day (TID) | ORAL | Status: DC | PRN
  Filled 2017-02-02: qty 1

## 2017-02-02 MED ORDER — IPRATROPIUM-ALBUTEROL 0.5-2.5 (3) MG/3ML IN SOLN
3.0000 mL | Freq: Three times a day (TID) | RESPIRATORY_TRACT | Status: DC
  Administered 2017-02-02 – 2017-02-03 (×2): 3 mL via RESPIRATORY_TRACT
  Filled 2017-02-02 (×3): qty 3

## 2017-02-02 NOTE — Progress Notes (Signed)
[facility] [place] NAME: Sara Mcdonald    MR#:  [id number]  DATE OF BIRTH:  [birth year]  SUBJECTIVE:   Pt. Here due to shortness of breath and chest pain. CT chest and chest x-ray suggestive of pneumonia.  Still having some shortness of breath on exertion.    REVIEW OF SYSTEMS:    Review of Systems  Constitutional: Negative for chills and fever.  HENT: Negative for congestion and tinnitus.   Eyes: Negative for blurred vision and double vision.  Respiratory: Positive for cough and shortness of breath. Negative for wheezing.   Cardiovascular: Negative for chest pain, orthopnea and PND.  Gastrointestinal: Negative for abdominal pain, diarrhea, nausea and vomiting.  Genitourinary: Negative for dysuria and hematuria.  Neurological: Negative for dizziness, sensory change and focal weakness.  All other systems reviewed and are negative.   Nutrition: Heart healthy/Carb control Tolerating Diet: Yes Tolerating PT: Await eval.   DRUG ALLERGIES:   Allergies  Allergen Reactions  . Lipitor [Atorvastatin]   . Penicillins   . Sulfur   . Tricor [Fenofibrate]     VITALS:  Blood pressure (!) 128/59, pulse (!) 110, temperature 98.4 F (36.9 C), temperature source Oral, resp. rate 16, height 4\' 11"  (1.499 m), weight 82.4 kg (181 lb 9.6 oz), SpO2 95 %.  PHYSICAL EXAMINATION:   Physical Exam  GENERAL:  71 y.o.-year-old patient lying in the bed in no acute distress.  EYES: Pupils equal, round, reactive to light and accommodation. No scleral icterus. Extraocular muscles intact.  HEENT: Head atraumatic, normocephalic. Oropharynx and nasopharynx clear.  NECK:  Supple, no jugular venous distention. No thyroid enlargement, no tenderness.  LUNGS: Normal breath sounds bilaterally, no wheezing, rales, rhonchi. No use of accessory muscles of respiration.  CARDIOVASCULAR: S1, S2 normal. No murmurs, rubs, or gallops.  ABDOMEN: Soft, nontender,  nondistended. Bowel sounds present. No organomegaly or mass.  EXTREMITIES: No cyanosis, clubbing or edema b/l.    NEUROLOGIC: Cranial nerves II through XII are intact. No focal Motor or sensory deficits b/l.   PSYCHIATRIC: The patient is alert and oriented x 3.  SKIN: No obvious rash, lesion, or ulcer.    LABORATORY PANEL:   CBC  Recent Labs Lab 02/02/17 0414  WBC 9.8  HGB 11.6*  HCT 33.9*  PLT 200   ------------------------------------------------------------------------------------------------------------------  Chemistries   Recent Labs Lab 02/02/17 0414  NA 135  K 4.1  CL 104  CO2 23  GLUCOSE 193*  BUN 16  CREATININE 1.04*  CALCIUM 8.2*   ------------------------------------------------------------------------------------------------------------------  Cardiac Enzymes  Recent Labs Lab 02/01/17 1134  TROPONINI <0.03   ------------------------------------------------------------------------------------------------------------------  RADIOLOGY:  Dg Chest 2 View  Result Date: 02/01/2017 CLINICAL DATA:  Shortness of breath and chest pain EXAM: CHEST  2 VIEW COMPARISON:  August 11, 2014 FINDINGS: There is scarring in both lower lung zones. There is no edema or consolidation. There is eventration of the right hemidiaphragm, stable. Heart is upper normal in size with pulmonary vascularity within normal limits. No adenopathy. No bone lesions. IMPRESSION: Areas of scarring bilaterally. No edema or consolidation. Stable cardiac silhouette. Electronically Signed   By: Lowella Grip III M.D.   On: 02/01/2017 12:33   Ct Angio Chest Pe W/cm &/or Wo Cm  Result Date: 02/01/2017 CLINICAL DATA:  Influenza diagnosis 3 weeks ago. Left-sided chest pain, productive cough and shortness of breath. EXAM: CT ANGIOGRAPHY CHEST WITH CONTRAST TECHNIQUE: Multidetector CT imaging of the chest was performed using the standard protocol  during bolus administration of intravenous contrast.  Multiplanar CT image reconstructions and MIPs were obtained to evaluate the vascular anatomy. CONTRAST:  75 cc Isovue 370 COMPARISON:  Chest radiography same day FINDINGS: Cardiovascular: Arterial opacification is good. There are no pulmonary emboli. No aortic atherosclerosis or acute aortic pathology. No visible coronary artery calcification. No pericardial fluid. Prominent epicardial fat. Mediastinum/Nodes: No mediastinal adenopathy or mass. Lungs/Pleura: There is dependent pulmonary atelectasis left worse than right that could be simple atelectasis or atelectatic pneumonia. No dense consolidation or lobar collapse. No pleural fluid. The patient could possibly have a degree of interstitial edema/ fluid overload. Prominent pleural fat. Upper Abdomen: Negative.  Previous cholecystectomy. Musculoskeletal: Negative Review of the MIP images confirms the above findings. IMPRESSION: No pulmonary emboli or acute systemic arterial finding. Dependent pulmonary atelectasis/infiltrate bilaterally left worse than right. No dense consolidation or lobar collapse. Interstitial pattern could also be seen with some degree of fluid overload/ mild edema. Electronically Signed   By: Nelson Chimes M.D.   On: 02/01/2017 16:33     ASSESSMENT AND PLAN:   71 year old female with past medical history of obstructive sleep apnea, osteoarthritis, hypertension, fibromyalgia, diabetes, anxiety/depression who presents to the hospital due to shortness of breath.  1. Sepsis-suspected to be secondary to pneumonia. Patient presented with tachycardia, leukocytosis and relative hypotension. She was also noted to be in acute respiratory failure with hypoxia. -Continue IV ceftriaxone, Zithromax for pneumonia. Continue DuoNeb's, budesonide nebulizers. -Cultures so far negative.  2. Pneumonia-suspected because of patient's sepsis. -Continue IV ceftriaxone, Zithromax. Follow cultures are negative so far.  3. Acute respiratory failure with  hypoxia-secondary to pneumonia. -Continue O2 supplementation, IV antibiotics as mentioned. - will get Echo to evaluate her EF, ?? SOB related to CHF.  - cont. IV Steroids.   4. DM - cont. SSI and follow BS  5. Depression/Anxiety - cont. Cymbalta, Xanax.   6. Hyperlipidemia - cont. Pravachol    All the records are reviewed and case discussed with Care Management/Social Worker. Management plans discussed with the patient, family and they are in agreement.  CODE STATUS: DNR  DVT Prophylaxis: Lovenox  TOTAL TIME TAKING CARE OF THIS PATIENT: 30 minutes.   POSSIBLE D/C IN 1-2 DAYS, DEPENDING ON CLINICAL CONDITION.   Henreitta Leber M.D on 02/02/2017 at 1:01 PM  Between 7am to 6pm - Pager - [phone]  After 6pm go to [email or web address] - Proofreader  [facility]  Office  [phone]  CC: Primary care physician; [name], MD

## 2017-02-02 NOTE — Progress Notes (Signed)
*  PRELIMINARY RESULTS* Echocardiogram 2D Echocardiogram has been performed.  Sherrie Sport 02/02/2017, 2:48 PM

## 2017-02-02 NOTE — Progress Notes (Signed)
Inpatient Diabetes Program Recommendations  AACE/ADA: New Consensus Statement on Inpatient Glycemic Control (2015)  Target Ranges:  Prepandial:   less than 140 mg/dL      Peak postprandial:   less than 180 mg/dL (1-2 hours)      Critically ill patients:  140 - 180 mg/dL   Lab Results  Component Value Date   GLUCAP 147 (H) 02/02/2017    Review of Glycemic Control   Results for Sara Mcdonald, Sara Mcdonald (MRN [id number]) as of 02/02/2017 12:43  Ref. Range 02/02/2017 06:35 02/02/2017 08:05 02/02/2017 11:33  Glucose-Capillary Latest Ref Range: 65 - 99 mg/dL 172 (H) 153 (H) 147 (H)   Diabetes history: Type 2 Outpatient Diabetes medications: Metformin 500mg  qday Current orders for Inpatient glycemic control: Novolog 0-9 units tid, Novolog 0-5 units qhs  * IV steroids daily  Inpatient Diabetes Program Recommendations:    Per ADA recommendations "consider performing an A1C on all patients with diabetes or hyperglycemia admitted to the hospital if not performed in the prior 3 months".  Gentry Fitz, RN, BA, MHA, CDE Diabetes Coordinator Inpatient Diabetes Program  [phone] (Team Pager) [phone] ([place]) 02/02/2017 12:50 PM

## 2017-02-03 LAB — ECHOCARDIOGRAM COMPLETE
Height: 59 in
Weight: 2905.6 oz

## 2017-02-03 LAB — URINE CULTURE: Culture: NO GROWTH

## 2017-02-03 LAB — GLUCOSE, CAPILLARY
Glucose-Capillary: 131 mg/dL — ABNORMAL HIGH (ref 65–99)
Glucose-Capillary: 110 mg/dL — ABNORMAL HIGH (ref 65–99)

## 2017-02-03 MED ORDER — GUAIFENESIN 100 MG/5ML PO SOLN
10.0000 mL | Freq: Once | ORAL | Status: AC
  Administered 2017-02-03: 200 mg via ORAL
  Filled 2017-02-03: qty 10

## 2017-02-03 MED ORDER — PREDNISONE 10 MG PO TABS
ORAL_TABLET | ORAL | 0 refills | Status: DC
Start: 2017-02-03 — End: 2017-03-18

## 2017-02-03 MED ORDER — DOXYCYCLINE MONOHYDRATE 100 MG PO TABS: 100.0000 mg | ORAL_TABLET | Freq: Two times a day (BID) | ORAL | 0 refills | Status: AC

## 2017-02-03 MED ORDER — PREDNISONE 10 MG PO TABS: ORAL_TABLET | ORAL | 0 refills | Status: DC

## 2017-02-03 MED ORDER — DOXYCYCLINE MONOHYDRATE 100 MG PO TABS: 100.0000 mg | ORAL_TABLET | Freq: Two times a day (BID) | ORAL | 0 refills | Status: DC

## 2017-02-03 NOTE — Plan of Care (Signed)
Problem: Education: Goal: Knowledge of [facility] Education information/materials will improve Outcome: Progressing VSS, free of falls during shift.  Reports continued HA 8/10, unchanged after PRN Tylenol 3 x1.  Pt asleep for most of shift after receiving PRN Xanax for sleep.  No other complaints.  Call bell within reach, [place].

## 2017-02-03 NOTE — Care Management Note (Signed)
Case Management Note  Patient Details  Name: Sara Mcdonald MRN: [id number] Date of Birth: May 10, 1946  Subjective/Objective:                  Spoke with patient by phone for discharge planning assessment. She states she is from home alone however she has been independent there. She walks independently and able to drive. She denies problems obtaining health care or medications. She states that her children as close by and very supportive of her. Her PCP is Dr. Emily Filbert. She has never needed home health services before.  Action/Plan: RNCM to follow for DME and Home health needs. I do not see a diagnosis for home or chronic O2 at this time.   Expected Discharge Date:                  Expected Discharge Plan:     In-House Referral:     Discharge planning Services  CM Consult  Post Acute Care Choice:  Durable Medical Equipment Choice offered to:  Patient  DME Arranged:  [facility] DME Agency:     HH Arranged:    [place] Agency:     Status of Service:  In process, will continue to follow  If discussed at Long Length of Stay Meetings, dates discussed:    Additional Comments:  Marshell Garfinkel, RN 02/03/2017, 8:50 AM

## 2017-02-03 NOTE — Progress Notes (Signed)
Discharge instructions along with home medications and follow up gone over with patient. She verbalized that she understood instructions. Two prescriptions given to patient. IV removed. Pt being discharged home on room air, no distress noted. Ammie Dalton, RN

## 2017-02-03 NOTE — Discharge Summary (Signed)
[place] [facility] [place] NAME: Sara Mcdonald    MR#:  [id number]  DATE OF BIRTH:  03/05/1946  DATE OF ADMISSION:  02/01/2017 ADMITTING PHYSICIAN: Loletha Grayer, MD  DATE OF DISCHARGE: 02/03/2017  PRIMARY CARE PHYSICIAN: [name], MD    ADMISSION DIAGNOSIS:  Hypoxia [R09.02] Sepsis, due to unspecified organism ([place]) [A41.9] Hypotension, unspecified hypotension type [I95.9]  DISCHARGE DIAGNOSIS:  Active Problems:   Acute respiratory failure with hypoxia ([place])   SECONDARY DIAGNOSIS:   Past Medical History:  Diagnosis Date  . Anxiety   . Depression   . Diabetes mellitus without complication ([address])   . Elevated lipids   . Fibromyalgia   . Headache   . Hypertension   . OA (osteoarthritis)   . Sleep apnea     HOSPITAL COURSE:   71 year old female with past medical history of obstructive sleep apnea, osteoarthritis, hypertension, fibromyalgia, diabetes, anxiety/depression who presents to the hospital due to shortness of breath.  1. Sepsis-suspected to be secondary to pneumonia. Patient presented with tachycardia, leukocytosis and relative hypotension. She was also noted to be in acute respiratory failure with hypoxia. She patient was treated empirically with IV ceftriaxone, Zithromax, she has clinically improved. She is afebrile and her cultures so far negative. -She is being discharged on oral doxycycline for an additional 7 days.  2. Pneumonia-suspected because of patient's sepsis. -Well in the hospital patient was treated with IV ceftriaxone, Zithromax and now being discharged on oral doxycycline.  3. Acute respiratory failure with hypoxia-secondary to pneumonia. -Patient has been weaned off oxygen and ambulated on room air without any evidence of hypoxemia. Her echocardiogram showed normal ejection fraction with no abnormalities. -Patient will be discharged on antibiotics as mentioned for pneumonia. She is also being placed  on a short course of prednisone taper.  4. DM - while in the hospital pt. Was on SSI and now will resume her Metformin.   5. Depression/Anxiety - cont. Cymbalta, Wellbutrin, Xanax.   6. Hyperlipidemia - she will cont. Pravachol  7. HTN - pt. Will resume her Lisinopril, Triamterene-HCTZ.   DISCHARGE CONDITIONS:   Stable.   CONSULTS OBTAINED:    DRUG ALLERGIES:   Allergies  Allergen Reactions  . Lipitor [Atorvastatin]   . Penicillins   . Sulfur   . Tricor [Fenofibrate]     DISCHARGE MEDICATIONS:   Allergies as of 02/03/2017      Reactions   Lipitor [atorvastatin]    Penicillins    Sulfur    Tricor [fenofibrate]       Medication List    STOP taking these medications   cefUROXime 500 MG tablet Commonly known as:  CEFTIN     TAKE these medications   acetaminophen-codeine 300-30 MG tablet Commonly known as:  TYLENOL #3 Take by mouth every 4 (four) hours as needed for moderate pain.   ALPRAZolam 0.5 MG tablet Commonly known as:  XANAX Take 0.5 mg by mouth 2 (two) times daily as needed for sleep.   buPROPion 150 MG 12 hr tablet Commonly known as:  WELLBUTRIN SR Take 150 mg by mouth daily.   doxycycline 100 MG tablet Commonly known as:  ADOXA Take 1 tablet (100 mg total) by mouth 2 (two) times daily.   DULoxetine 60 MG capsule Commonly known as:  CYMBALTA Take 60 mg by mouth daily.   lisinopril 10 MG tablet Commonly known as:  PRINIVIL,ZESTRIL Take 10 mg by mouth daily.   metFORMIN 500 MG tablet Commonly  known as:  GLUCOPHAGE Take 500 mg by mouth daily.   multivitamin capsule Take 1 capsule by mouth daily.   nystatin-triamcinolone cream Commonly known as:  MYCOLOG II Apply 1 application topically 2 (two) times daily.   pantoprazole 40 MG tablet Commonly known as:  PROTONIX Take 40 mg by mouth 2 (two) times daily.   PHENERGAN 25 MG suppository Generic drug:  promethazine Place 25 mg rectally every 6 (six) hours as needed for nausea or  vomiting.   pravastatin 80 MG tablet Commonly known as:  PRAVACHOL Take 80 mg by mouth daily.   predniSONE 10 MG tablet Commonly known as:  DELTASONE Label  & dispense according to the schedule below. 5 Pills PO for 1 day then, 4 Pills PO for 1 day, 3 Pills PO for 1 day, 2 Pills PO for 1 day, 1 Pill PO for 1 days then STOP. What changed:  medication strength  how much to take  how to take this  additional instructions   rizatriptan 10 MG tablet Commonly known as:  MAXALT Take 10 mg by mouth every 2 (two) hours as needed.   traZODone 100 MG tablet Commonly known as:  DESYREL Take 100 mg by mouth at bedtime.   triamterene-hydrochlorothiazide 37.5-25 MG tablet Commonly known as:  MAXZIDE-25 Take 1 tablet by mouth daily.   zolpidem 10 MG tablet Commonly known as:  AMBIEN Take 10 mg by mouth at bedtime as needed for sleep.         DISCHARGE INSTRUCTIONS:   DIET:  Cardiac diet and Diabetic diet  DISCHARGE CONDITION:  Stable  ACTIVITY:  Activity as tolerated  OXYGEN:  Home Oxygen: No.   Oxygen Delivery: room air  DISCHARGE LOCATION:  home   If you experience worsening of your admission symptoms, develop shortness of breath, life threatening emergency, suicidal or homicidal thoughts you must seek medical attention immediately by calling 911 or calling your MD immediately  if symptoms less severe.  You Must read complete instructions/literature along with all the possible adverse reactions/side effects for all the Medicines you take and that have been prescribed to you. Take any new Medicines after you have completely understood and accpet all the possible adverse reactions/side effects.   Please note  You were cared for by a hospitalist during your hospital stay. If you have any questions about your discharge medications or the care you received while you were in the hospital after you are discharged, you can call the unit and asked to speak with the  hospitalist on call if the hospitalist that took care of you is not available. Once you are discharged, your primary care physician will handle any further medical issues. Please note that NO REFILLS for any discharge medications will be authorized once you are discharged, as it is imperative that you return to your primary care physician (or establish a relationship with a primary care physician if you do not have one) for your aftercare needs so that they can reassess your need for medications and monitor your lab values.     Today   Still has a cough but shortness of breath improved.  No other complaints.  Family at bedside.   VITAL SIGNS:  Blood pressure (!) 149/70, pulse (!) 110, temperature 98.4 F (36.9 C), temperature source Oral, resp. rate 20, height 4\' 11"  (1.499 m), weight 82.4 kg (181 lb 9.6 oz), SpO2 96 %.  I/O:   Intake/Output Summary (Last 24 hours) at 02/03/17 1345 Last data filed at  02/03/17 0900  Gross per 24 hour  Intake             2065 ml  Output                0 ml  Net             2065 ml    PHYSICAL EXAMINATION:  GENERAL:  71 y.o.-year-old patient lying in the bed in no acute distress.  EYES: Pupils equal, round, reactive to light and accommodation. No scleral icterus. Extraocular muscles intact.  HEENT: Head atraumatic, normocephalic. Oropharynx and nasopharynx clear.  NECK:  Supple, no jugular venous distention. No thyroid enlargement, no tenderness.  LUNGS: Normal breath sounds bilaterally, no wheezing, rales,rhonchi. No use of accessory muscles of respiration.  CARDIOVASCULAR: S1, S2 normal. No murmurs, rubs, or gallops.  ABDOMEN: Soft, non-tender, non-distended. Bowel sounds present. No organomegaly or mass.  EXTREMITIES: No pedal edema, cyanosis, or clubbing.  NEUROLOGIC: Cranial nerves II through XII are intact. No focal motor or sensory defecits b/l.  PSYCHIATRIC: The patient is alert and oriented x 3.   SKIN: No obvious rash, lesion, or ulcer.    DATA REVIEW:   CBC  Recent Labs Lab 02/02/17 0414  WBC 9.8  HGB 11.6*  HCT 33.9*  PLT 200    Chemistries   Recent Labs Lab 02/02/17 0414  NA 135  K 4.1  CL 104  CO2 23  GLUCOSE 193*  BUN 16  CREATININE 1.04*  CALCIUM 8.2*    Cardiac Enzymes  Recent Labs Lab 02/01/17 1134  [place] <0.03    Microbiology Results  Results for orders placed or performed during the hospital encounter of 02/01/17  Blood Culture (routine x 2)     Status: None (Preliminary result)   Collection Time: 02/01/17  4:30 PM  Result Value Ref Range Status   Specimen Description BLOOD RIGHT AC  Final   Special Requests BOTTLES DRAWN AEROBIC AND ANAEROBIC [place]  Final   Culture NO GROWTH 2 DAYS  Final   Report Status PENDING  Incomplete  Blood Culture (routine x 2)     Status: None (Preliminary result)   Collection Time: 02/01/17  4:30 PM  Result Value Ref Range Status   Specimen Description BLOOD LEFT AC  Final   Special Requests BOTTLES DRAWN AEROBIC AND ANAEROBIC [place]  Final   Culture NO GROWTH 2 DAYS  Final   Report Status PENDING  Incomplete  Urine culture     Status: None   Collection Time: 02/01/17  6:13 PM  Result Value Ref Range Status   Specimen Description URINE, RANDOM  Final   Special Requests NONE  Final   Culture   Final    NO GROWTH Performed at [facility], [place] [address] [place], [place] [address]    Report Status 02/03/2017 FINAL  Final    RADIOLOGY:  Ct Angio Chest Pe W/cm &/or Wo Cm  Result Date: 02/01/2017 CLINICAL DATA:  Influenza diagnosis 3 weeks ago. Left-sided chest pain, productive cough and shortness of breath. EXAM: CT ANGIOGRAPHY CHEST WITH CONTRAST TECHNIQUE: Multidetector CT imaging of the chest was performed using the standard protocol during bolus administration of intravenous contrast. Multiplanar CT image reconstructions and MIPs were obtained to evaluate the vascular anatomy. CONTRAST:  75 cc Isovue 370 COMPARISON:  Chest radiography  same day FINDINGS: Cardiovascular: Arterial opacification is good. There are no pulmonary emboli. No aortic atherosclerosis or acute aortic pathology. No visible coronary artery calcification. No pericardial  fluid. Prominent epicardial fat. Mediastinum/Nodes: No mediastinal adenopathy or mass. Lungs/Pleura: There is dependent pulmonary atelectasis left worse than right that could be simple atelectasis or atelectatic pneumonia. No dense consolidation or lobar collapse. No pleural fluid. The patient could possibly have a degree of interstitial edema/ fluid overload. Prominent pleural fat. Upper Abdomen: Negative.  Previous cholecystectomy. Musculoskeletal: Negative Review of the MIP images confirms the above findings. IMPRESSION: No pulmonary emboli or acute systemic arterial finding. Dependent pulmonary atelectasis/infiltrate bilaterally left worse than right. No dense consolidation or lobar collapse. Interstitial pattern could also be seen with some degree of fluid overload/ mild edema. Electronically Signed   By: Nelson Chimes M.D.   On: 02/01/2017 16:33      Management plans discussed with the patient, family and they are in agreement.  CODE STATUS:     Code Status Orders        Start     Ordered   02/01/17 1814  Do not attempt resuscitation (DNR)  Continuous    Question Answer Comment  In the event of cardiac or respiratory ARREST Do not call a "code blue"   In the event of cardiac or respiratory ARREST Do not perform Intubation, CPR, defibrillation or ACLS   In the event of cardiac or respiratory ARREST Use medication by any route, position, wound care, and other measures to relive pain and suffering. May use oxygen, suction and manual treatment of airway obstruction as needed for comfort.   Comments nurse may pronounce      02/01/17 1814    Code Status History    Date Active Date Inactive Code Status Order ID Comments User Context   This patient has a current code status but no  historical code status.    Advance Directive Documentation   Flowsheet Row Most Recent Value  Type of Advance Directive  Healthcare Power of Attorney, Living will, Out of facility DNR (pink MOST or yellow form)  Pre-existing out of facility DNR order (yellow form or pink MOST form)  No data  "MOST" Form in Place?  No data      TOTAL TIME TAKING CARE OF THIS PATIENT: 40 minutes.    Henreitta Leber M.D on 02/03/2017 at 1:45 PM  Between 7am to 6pm - Pager - [phone]  After 6pm go to [email or web address] - Proofreader  [facility]  Office  [phone]  CC: Primary care physician; [name], MD

## 2017-02-06 LAB — CULTURE, BLOOD (ROUTINE X 2)
CULTURE: NO GROWTH
Culture: NO GROWTH

## 2017-02-09 DIAGNOSIS — J181 Lobar pneumonia, unspecified organism: Secondary | ICD-10-CM | POA: Diagnosis not present

## 2017-02-09 DIAGNOSIS — A419 Sepsis, unspecified organism: Secondary | ICD-10-CM | POA: Diagnosis not present

## 2017-02-09 DIAGNOSIS — J189 Pneumonia, unspecified organism: Secondary | ICD-10-CM | POA: Diagnosis not present

## 2017-02-09 DIAGNOSIS — J9601 Acute respiratory failure with hypoxia: Secondary | ICD-10-CM | POA: Diagnosis not present

## 2017-02-14 DIAGNOSIS — E119 Type 2 diabetes mellitus without complications: Secondary | ICD-10-CM | POA: Diagnosis not present

## 2017-03-14 DIAGNOSIS — E119 Type 2 diabetes mellitus without complications: Secondary | ICD-10-CM | POA: Diagnosis not present

## 2017-03-14 DIAGNOSIS — Z0000 Encounter for general adult medical examination without abnormal findings: Secondary | ICD-10-CM | POA: Diagnosis not present

## 2017-03-14 DIAGNOSIS — R51 Headache: Secondary | ICD-10-CM | POA: Diagnosis not present

## 2017-03-17 ENCOUNTER — Emergency Department: Payer: PPO

## 2017-03-17 ENCOUNTER — Encounter: Payer: Self-pay | Admitting: Emergency Medicine

## 2017-03-17 ENCOUNTER — Inpatient Hospital Stay
Admission: EM | Admit: 2017-03-17 | Discharge: 2017-03-18 | DRG: 871 | Disposition: A | Discharge status: Home / Self Care | Payer: PPO | Attending: Internal Medicine | Admitting: Internal Medicine

## 2017-03-17 DIAGNOSIS — J40 Bronchitis, not specified as acute or chronic: Secondary | ICD-10-CM | POA: Diagnosis not present

## 2017-03-17 DIAGNOSIS — N289 Disorder of kidney and ureter, unspecified: Secondary | ICD-10-CM

## 2017-03-17 DIAGNOSIS — R51 Headache: Secondary | ICD-10-CM | POA: Diagnosis present

## 2017-03-17 DIAGNOSIS — R0902 Hypoxemia: Secondary | ICD-10-CM | POA: Diagnosis not present

## 2017-03-17 DIAGNOSIS — Z7984 Long term (current) use of oral hypoglycemic drugs: Secondary | ICD-10-CM

## 2017-03-17 DIAGNOSIS — F419 Anxiety disorder, unspecified: Secondary | ICD-10-CM | POA: Diagnosis present

## 2017-03-17 DIAGNOSIS — A419 Sepsis, unspecified organism: Principal | ICD-10-CM | POA: Diagnosis present

## 2017-03-17 DIAGNOSIS — I129 Hypertensive chronic kidney disease with stage 1 through stage 4 chronic kidney disease, or unspecified chronic kidney disease: Secondary | ICD-10-CM | POA: Diagnosis present

## 2017-03-17 DIAGNOSIS — I10 Essential (primary) hypertension: Secondary | ICD-10-CM | POA: Diagnosis present

## 2017-03-17 DIAGNOSIS — Z880 Allergy status to penicillin: Secondary | ICD-10-CM

## 2017-03-17 DIAGNOSIS — G4730 Sleep apnea, unspecified: Secondary | ICD-10-CM | POA: Diagnosis not present

## 2017-03-17 DIAGNOSIS — R079 Chest pain, unspecified: Secondary | ICD-10-CM | POA: Diagnosis not present

## 2017-03-17 DIAGNOSIS — N179 Acute kidney failure, unspecified: Secondary | ICD-10-CM | POA: Diagnosis not present

## 2017-03-17 DIAGNOSIS — E1122 Type 2 diabetes mellitus with diabetic chronic kidney disease: Secondary | ICD-10-CM | POA: Diagnosis present

## 2017-03-17 DIAGNOSIS — N182 Chronic kidney disease, stage 2 (mild): Secondary | ICD-10-CM | POA: Diagnosis present

## 2017-03-17 DIAGNOSIS — J441 Chronic obstructive pulmonary disease with (acute) exacerbation: Secondary | ICD-10-CM

## 2017-03-17 DIAGNOSIS — J9601 Acute respiratory failure with hypoxia: Secondary | ICD-10-CM | POA: Diagnosis present

## 2017-03-17 DIAGNOSIS — Z882 Allergy status to sulfonamides status: Secondary | ICD-10-CM

## 2017-03-17 DIAGNOSIS — J189 Pneumonia, unspecified organism: Secondary | ICD-10-CM

## 2017-03-17 DIAGNOSIS — E785 Hyperlipidemia, unspecified: Secondary | ICD-10-CM | POA: Diagnosis not present

## 2017-03-17 DIAGNOSIS — J9801 Acute bronchospasm: Secondary | ICD-10-CM | POA: Diagnosis present

## 2017-03-17 DIAGNOSIS — F329 Major depressive disorder, single episode, unspecified: Secondary | ICD-10-CM | POA: Diagnosis present

## 2017-03-17 DIAGNOSIS — R0602 Shortness of breath: Secondary | ICD-10-CM | POA: Diagnosis not present

## 2017-03-17 DIAGNOSIS — Z888 Allergy status to other drugs, medicaments and biological substances status: Secondary | ICD-10-CM

## 2017-03-17 DIAGNOSIS — R05 Cough: Secondary | ICD-10-CM | POA: Diagnosis not present

## 2017-03-17 DIAGNOSIS — E871 Hypo-osmolality and hyponatremia: Secondary | ICD-10-CM | POA: Diagnosis not present

## 2017-03-17 DIAGNOSIS — N189 Chronic kidney disease, unspecified: Secondary | ICD-10-CM

## 2017-03-17 LAB — URINALYSIS, COMPLETE (UACMP) WITH MICROSCOPIC
Bacteria, UA: NONE SEEN
Bilirubin Urine: NEGATIVE
Glucose, UA: NEGATIVE mg/dL
Hgb urine dipstick: NEGATIVE
Ketones, ur: NEGATIVE mg/dL
Leukocytes, UA: NEGATIVE
Nitrite: NEGATIVE
Protein, ur: NEGATIVE mg/dL
Specific Gravity, Urine: 1.018 (ref 1.005–1.030)
pH: 5 (ref 5.0–8.0)

## 2017-03-17 LAB — COMPREHENSIVE METABOLIC PANEL WITH GFR
ALT: 14 U/L (ref 14–54)
AST: 26 U/L (ref 15–41)
Albumin: 3.8 g/dL (ref 3.5–5.0)
Alkaline Phosphatase: 58 U/L (ref 38–126)
Anion gap: 8 (ref 5–15)
BUN: 16 mg/dL (ref 6–20)
CO2: 24 mmol/L (ref 22–32)
Calcium: 8.8 mg/dL — ABNORMAL LOW (ref 8.9–10.3)
Chloride: 96 mmol/L — ABNORMAL LOW (ref 101–111)
Creatinine, Ser: 1.4 mg/dL — ABNORMAL HIGH (ref 0.44–1.00)
GFR calc Af Amer: 43 mL/min — ABNORMAL LOW
GFR calc non Af Amer: 37 mL/min — ABNORMAL LOW
Glucose, Bld: 114 mg/dL — ABNORMAL HIGH (ref 65–99)
Potassium: 3.5 mmol/L (ref 3.5–5.1)
Sodium: 128 mmol/L — ABNORMAL LOW (ref 135–145)
Total Bilirubin: 0.7 mg/dL (ref 0.3–1.2)
Total Protein: 6.4 g/dL — ABNORMAL LOW (ref 6.5–8.1)

## 2017-03-17 LAB — CBC WITH DIFFERENTIAL/PLATELET
BASOS ABS: 0 10*3/uL (ref 0–0.1)
Basophils Relative: 1 %
Eosinophils Absolute: 0.1 10*3/uL (ref 0–0.7)
Eosinophils Relative: 2 %
HEMATOCRIT: 39 % (ref 35.0–47.0)
HEMOGLOBIN: 13.5 g/dL (ref 12.0–16.0)
Lymphocytes Relative: 8 %
Lymphs Abs: 0.4 10*3/uL — ABNORMAL LOW (ref 1.0–3.6)
MCH: 30.2 pg (ref 26.0–34.0)
MCHC: 34.6 g/dL (ref 32.0–36.0)
MCV: 87.1 fL (ref 80.0–100.0)
MONOS PCT: 10 %
Monocytes Absolute: 0.6 10*3/uL (ref 0.2–0.9)
NEUTROS ABS: 4.7 10*3/uL (ref 1.4–6.5)
NEUTROS PCT: 79 %
Platelets: 184 10*3/uL (ref 150–440)
RBC: 4.47 MIL/uL (ref 3.80–5.20)
RDW: 13.9 % (ref 11.5–14.5)
WBC: 5.9 10*3/uL (ref 3.6–11.0)

## 2017-03-17 LAB — GLUCOSE, CAPILLARY
GLUCOSE-CAPILLARY: 104 mg/dL — AB (ref 65–99)
Glucose-Capillary: 155 mg/dL — ABNORMAL HIGH (ref 65–99)

## 2017-03-17 LAB — LACTIC ACID, PLASMA: Lactic Acid, Venous: 0.7 mmol/L (ref 0.5–1.9)

## 2017-03-17 LAB — TROPONIN I: Troponin I: <0.03 ng/mL

## 2017-03-17 IMAGING — CR DG CHEST 2V
2 series · 2 of 2 positions shown · non-contrast
Comparison: CT [DATE].  Chest x-ray [DATE].

CLINICAL DATA: Shortness of breath.  Chest pain.

EXAM:
CHEST  2 VIEW

[chest pa]
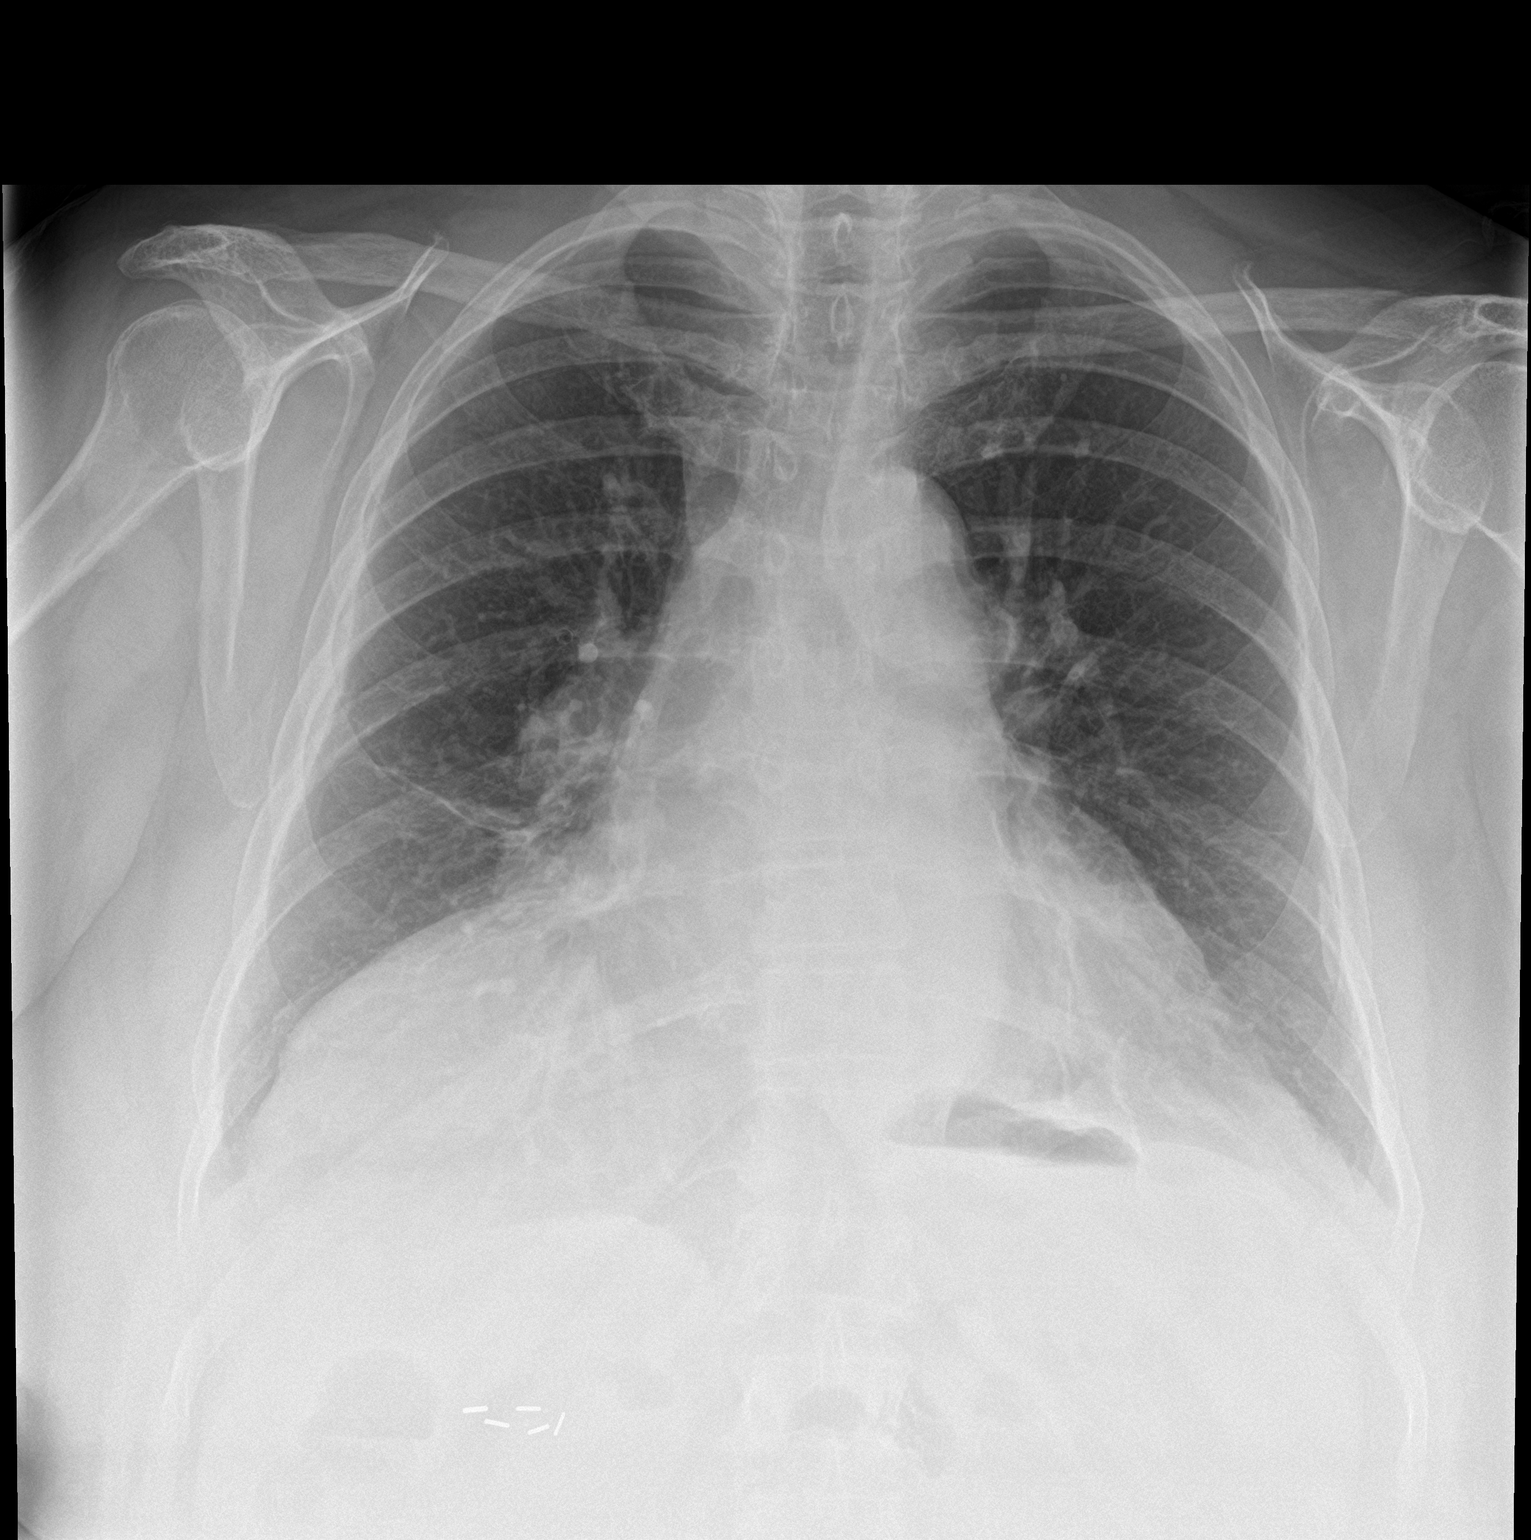

[chest lat]
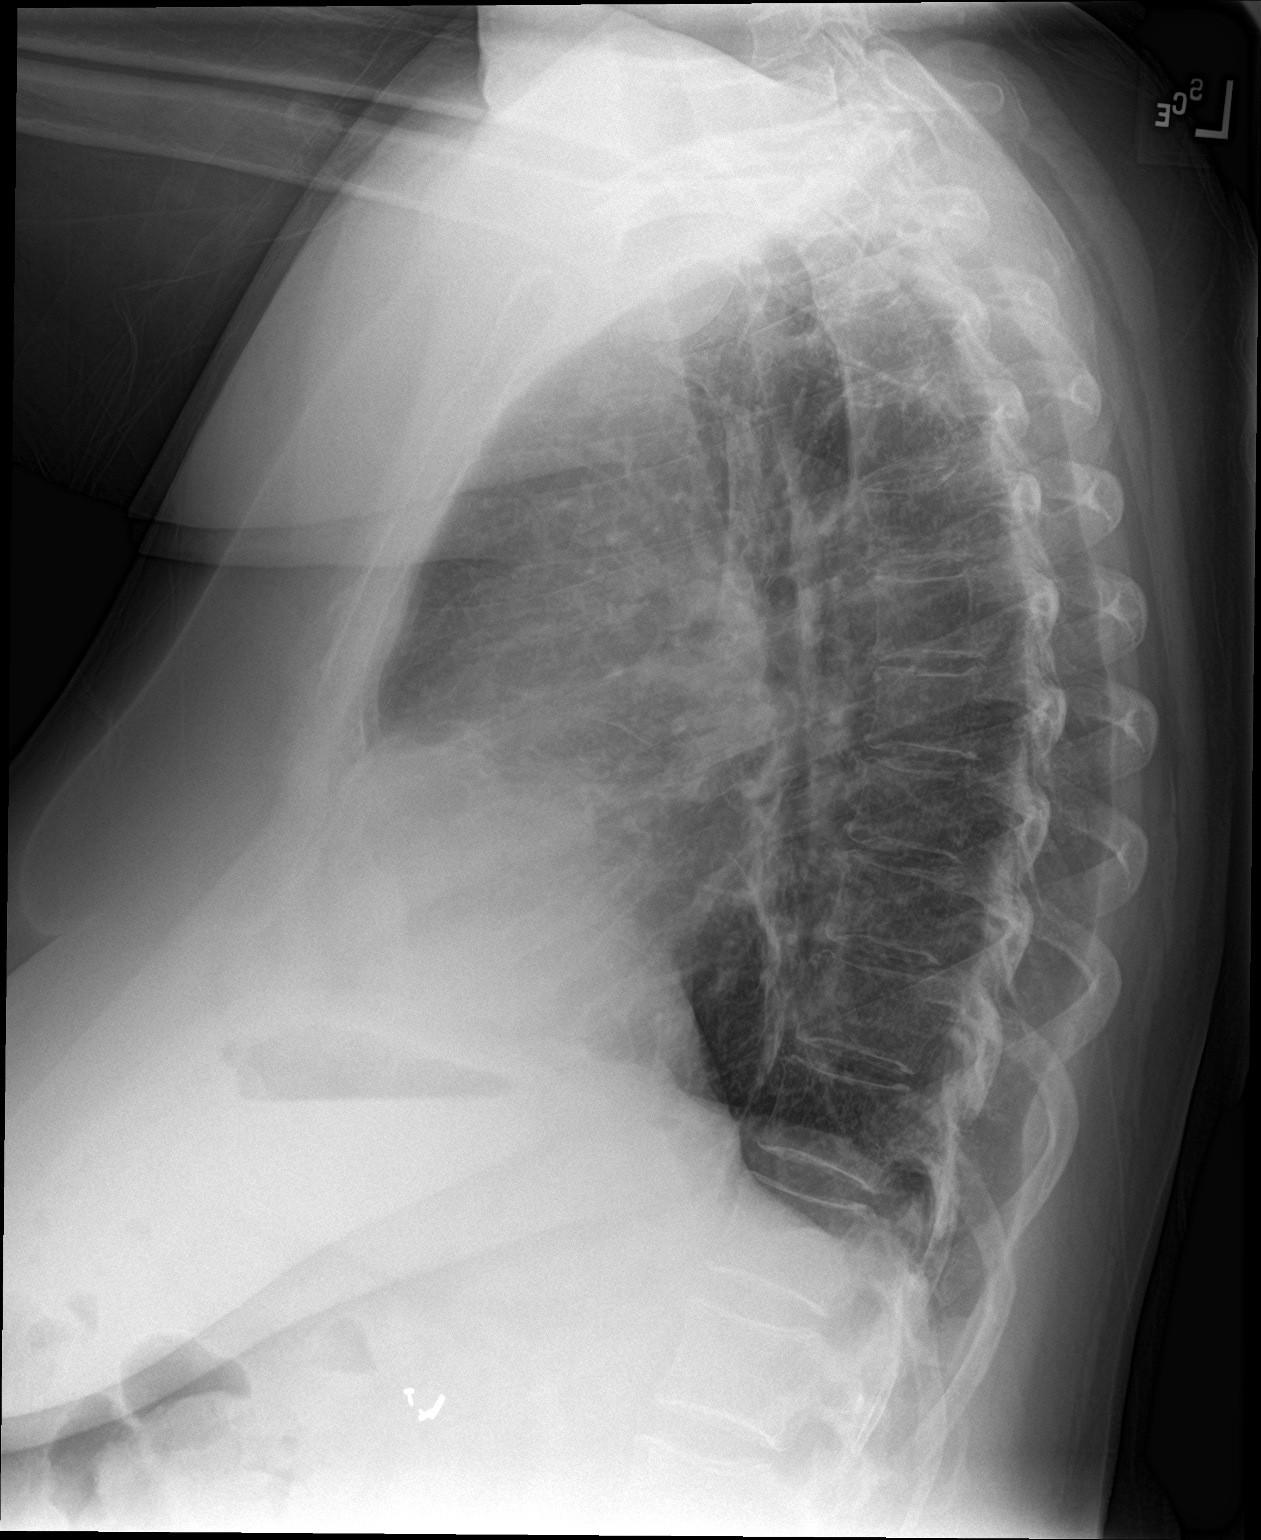

[2 of 2 positions shown; findings below may reference images not displayed]

FINDINGS: Mediastinum and hilar structures normal. Cardiomegaly. Mild
bibasilar atelectasis and/or scarring. No pleural effusion or
pneumothorax. No acute bony abnormality. Surgical clips right upper
quadrant.
IMPRESSION: 1. Mild bibasilar subsegmental atelectasis and/or scarring. No
change from prior exams.

2. Stable cardiomegaly.

## 2017-03-17 MED ORDER — POLYETHYLENE GLYCOL 3350 17 G PO PACK
17.0000 g | PACK | ORAL | Status: DC
  Administered 2017-03-18: 08:00:00 17 g via ORAL
  Filled 2017-03-17 (×2): qty 1

## 2017-03-17 MED ORDER — ACETAMINOPHEN-CODEINE #3 300-30 MG PO TABS
1.0000 | ORAL_TABLET | ORAL | Status: DC | PRN
  Administered 2017-03-17: 1 via ORAL
  Filled 2017-03-17: qty 1

## 2017-03-17 MED ORDER — IPRATROPIUM-ALBUTEROL 0.5-2.5 (3) MG/3ML IN SOLN
3.0000 mL | Freq: Once | RESPIRATORY_TRACT | Status: AC
  Administered 2017-03-17: 3 mL via RESPIRATORY_TRACT
  Filled 2017-03-17: qty 3

## 2017-03-17 MED ORDER — SODIUM CHLORIDE 0.9 % IV SOLN
INTRAVENOUS | Status: DC
  Administered 2017-03-17 – 2017-03-18 (×2): via INTRAVENOUS

## 2017-03-17 MED ORDER — METHYLPREDNISOLONE SODIUM SUCC 125 MG IJ SOLR
60.0000 mg | Freq: Two times a day (BID) | INTRAMUSCULAR | Status: DC
  Administered 2017-03-18: 60 mg via INTRAVENOUS
  Filled 2017-03-17: qty 2

## 2017-03-17 MED ORDER — PANTOPRAZOLE SODIUM 40 MG PO TBEC
40.0000 mg | DELAYED_RELEASE_TABLET | Freq: Two times a day (BID) | ORAL | Status: DC
Start: 2017-03-17 — End: 2017-03-18
  Administered 2017-03-17 – 2017-03-18 (×2): 40 mg via ORAL
  Filled 2017-03-17 (×2): qty 1

## 2017-03-17 MED ORDER — ALBUTEROL SULFATE (2.5 MG/3ML) 0.083% IN NEBU
2.5000 mg | INHALATION_SOLUTION | RESPIRATORY_TRACT | Status: DC | PRN
  Administered 2017-03-17: 2.5 mg via RESPIRATORY_TRACT
  Filled 2017-03-17: qty 3

## 2017-03-17 MED ORDER — LEVOFLOXACIN IN D5W 250 MG/50ML IV SOLN
250.0000 mg | INTRAVENOUS | Status: DC
  Filled 2017-03-17: qty 50

## 2017-03-17 MED ORDER — IPRATROPIUM BROMIDE 0.02 % IN SOLN
0.5000 mg | RESPIRATORY_TRACT | Status: DC
  Administered 2017-03-17 – 2017-03-18 (×3): 0.5 mg via RESPIRATORY_TRACT
  Filled 2017-03-17 (×3): qty 2.5

## 2017-03-17 MED ORDER — GUAIFENESIN ER 600 MG PO TB12
600.0000 mg | ORAL_TABLET | Freq: Two times a day (BID) | ORAL | Status: DC
  Administered 2017-03-17 – 2017-03-18 (×2): 600 mg via ORAL
  Filled 2017-03-17 (×2): qty 1

## 2017-03-17 MED ORDER — SODIUM CHLORIDE 0.9% FLUSH
3.0000 mL | Freq: Two times a day (BID) | INTRAVENOUS | Status: DC
  Administered 2017-03-18: 3 mL via INTRAVENOUS

## 2017-03-17 MED ORDER — TRAZODONE HCL 100 MG PO TABS
100.0000 mg | ORAL_TABLET | Freq: Every day | ORAL | Status: DC
  Administered 2017-03-17: 100 mg via ORAL
  Filled 2017-03-17: qty 1

## 2017-03-17 MED ORDER — TOPIRAMATE 25 MG PO TABS
25.0000 mg | ORAL_TABLET | Freq: Every day | ORAL | Status: DC
  Administered 2017-03-17: 21:00:00 25 mg via ORAL
  Filled 2017-03-17 (×3): qty 1

## 2017-03-17 MED ORDER — DULOXETINE HCL 60 MG PO CPEP
60.0000 mg | ORAL_CAPSULE | Freq: Every day | ORAL | Status: DC
  Administered 2017-03-18: 09:00:00 60 mg via ORAL
  Filled 2017-03-17: qty 1

## 2017-03-17 MED ORDER — INSULIN ASPART 100 UNIT/ML ~~LOC~~ SOLN
6.0000 [IU] | Freq: Three times a day (TID) | SUBCUTANEOUS | Status: DC
  Administered 2017-03-18: 6 [IU] via SUBCUTANEOUS
  Filled 2017-03-17: qty 6

## 2017-03-17 MED ORDER — LEVOFLOXACIN IN D5W 750 MG/150ML IV SOLN
750.0000 mg | Freq: Once | INTRAVENOUS | Status: AC
  Administered 2017-03-17: 750 mg via INTRAVENOUS
  Filled 2017-03-17: qty 150

## 2017-03-17 MED ORDER — ENOXAPARIN SODIUM 40 MG/0.4ML ~~LOC~~ SOLN
40.0000 mg | SUBCUTANEOUS | Status: DC
  Administered 2017-03-17: 21:00:00 40 mg via SUBCUTANEOUS
  Filled 2017-03-17: qty 0.4

## 2017-03-17 MED ORDER — NYSTATIN-TRIAMCINOLONE 100000-0.1 UNIT/GM-% EX CREA
1.0000 "application " | TOPICAL_CREAM | Freq: Two times a day (BID) | CUTANEOUS | Status: DC | PRN
  Filled 2017-03-17: qty 15

## 2017-03-17 MED ORDER — ALBUTEROL SULFATE (2.5 MG/3ML) 0.083% IN NEBU
2.5000 mg | INHALATION_SOLUTION | RESPIRATORY_TRACT | Status: DC
  Administered 2017-03-17 – 2017-03-18 (×2): 2.5 mg via RESPIRATORY_TRACT
  Filled 2017-03-17 (×2): qty 3

## 2017-03-17 MED ORDER — ZOLPIDEM TARTRATE 5 MG PO TABS
5.0000 mg | ORAL_TABLET | Freq: Every evening | ORAL | Status: DC | PRN
  Administered 2017-03-17: 5 mg via ORAL
  Filled 2017-03-17: qty 1

## 2017-03-17 MED ORDER — INSULIN ASPART 100 UNIT/ML ~~LOC~~ SOLN: 0.0000 [IU] | Freq: Every day | SUBCUTANEOUS | Status: DC

## 2017-03-17 MED ORDER — ONDANSETRON HCL 4 MG PO TABS: 4.0000 mg | ORAL_TABLET | Freq: Four times a day (QID) | ORAL | Status: DC | PRN

## 2017-03-17 MED ORDER — ACETAMINOPHEN 650 MG RE SUPP: 650.0000 mg | Freq: Four times a day (QID) | RECTAL | Status: DC | PRN

## 2017-03-17 MED ORDER — PRAVASTATIN SODIUM 40 MG PO TABS
80.0000 mg | ORAL_TABLET | Freq: Every day | ORAL | Status: DC
  Administered 2017-03-18: 09:00:00 80 mg via ORAL
  Filled 2017-03-17: qty 2

## 2017-03-17 MED ORDER — METFORMIN HCL 500 MG PO TABS
500.0000 mg | ORAL_TABLET | Freq: Every day | ORAL | Status: DC
  Administered 2017-03-18: 08:00:00 500 mg via ORAL
  Filled 2017-03-17: qty 1

## 2017-03-17 MED ORDER — INSULIN ASPART 100 UNIT/ML ~~LOC~~ SOLN
0.0000 [IU] | Freq: Three times a day (TID) | SUBCUTANEOUS | Status: DC
  Administered 2017-03-18: 08:00:00 3 [IU] via SUBCUTANEOUS
  Filled 2017-03-17: qty 3

## 2017-03-17 MED ORDER — ALBUTEROL SULFATE (2.5 MG/3ML) 0.083% IN NEBU: 2.5000 mg | INHALATION_SOLUTION | RESPIRATORY_TRACT | Status: DC

## 2017-03-17 MED ORDER — ONDANSETRON HCL 4 MG/2ML IJ SOLN: 4.0000 mg | Freq: Four times a day (QID) | INTRAMUSCULAR | Status: DC | PRN

## 2017-03-17 MED ORDER — SODIUM CHLORIDE 0.9 % IV BOLUS (SEPSIS)
1000.0000 mL | Freq: Once | INTRAVENOUS | Status: AC
Start: 2017-03-17 — End: 2017-03-17
  Administered 2017-03-17: 1000 mL via INTRAVENOUS

## 2017-03-17 MED ORDER — ALPRAZOLAM 0.5 MG PO TABS
0.5000 mg | ORAL_TABLET | Freq: Two times a day (BID) | ORAL | Status: DC | PRN
  Administered 2017-03-17: 21:00:00 0.5 mg via ORAL
  Filled 2017-03-17: qty 1

## 2017-03-17 MED ORDER — BUPROPION HCL ER (SR) 150 MG PO TB12
150.0000 mg | ORAL_TABLET | Freq: Every day | ORAL | Status: DC
  Administered 2017-03-18: 08:00:00 150 mg via ORAL
  Filled 2017-03-17 (×3): qty 1

## 2017-03-17 MED ORDER — METHYLPREDNISOLONE SODIUM SUCC 125 MG IJ SOLR
125.0000 mg | Freq: Once | INTRAMUSCULAR | Status: AC
  Administered 2017-03-17: 125 mg via INTRAVENOUS
  Filled 2017-03-17: qty 2

## 2017-03-17 MED ORDER — ACETAMINOPHEN 325 MG PO TABS
650.0000 mg | ORAL_TABLET | Freq: Four times a day (QID) | ORAL | Status: DC | PRN
  Administered 2017-03-17: 19:00:00 650 mg via ORAL
  Filled 2017-03-17: qty 2

## 2017-03-17 NOTE — ED Triage Notes (Signed)
Pt to ed with c/o cough, congestion and fever x 3 days. sats 89% at triage on RA.

## 2017-03-17 NOTE — Progress Notes (Signed)
Anticoagulation monitoring(Lovenox):  71 yo female ordered Lovenox 30 mg Q24h  Filed Weights   03/17/17 1205  Weight: 170 lb (77.1 kg)   BMI 34.4   Lab Results  Component Value Date   CREATININE 1.40 (H) 03/17/2017   CREATININE 1.04 (H) 02/02/2017   CREATININE 1.19 (H) 02/01/2017   Estimated Creatinine Clearance: 33.5 mL/min (A) (by C-G formula based on SCr of 1.4 mg/dL (H)). Hemoglobin & Hematocrit     Component Value Date/Time   HGB 13.5 03/17/2017 1214   HGB 13.7 08/11/2014 1310   HCT 39.0 03/17/2017 1214   HCT 42.2 08/11/2014 1310     Per Protocol for Patient with estCrcl > 30 ml/min and BMI < 40, will transition to Lovenox 40 mg Q24h.

## 2017-03-17 NOTE — H&P (Addendum)
[place] [facility] [place] NAME: Sara Mcdonald    MR#:  [id number]  DATE OF BIRTH:  [birth year]  DATE OF ADMISSION:  03/17/2017  PRIMARY CARE PHYSICIAN: [name], MD   REQUESTING/REFERRING PHYSICIAN:   CHIEF COMPLAINT:   Chief Complaint  Patient presents with  . Cough  . Fever    HISTORY OF PRESENT ILLNESS: Sara Mcdonald  is a 71 y.o. female with a known history of Diabetes, anxiety, depression, hyperlipidemia, fibromyalgia, hypertension, gastritis, COPD, sleep apnea, who presents to the hospital with complaints of shortness of breath, coughing yellow phlegm, having fevers and chills, having significant weakness. According to the patient, her illness started yesterday. She is very having fevers and chills, felt very fatigued, weak, even fell down last night. She has been short of breath, coughing, wheezing, producing yellow phlegm. She came into the emergency room for further evaluation and treatment and was noted to have pneumonia, hospitalist services were contacted for admission  PAST MEDICAL HISTORY:   Past Medical History:  Diagnosis Date  . Anxiety   . Depression   . Diabetes mellitus without complication ([place])   . Elevated lipids   . Fibromyalgia   . Headache   . Hypertension   . OA (osteoarthritis)   . Sleep apnea     PAST SURGICAL HISTORY: Past Surgical History:  Procedure Laterality Date  . ABDOMINAL HYSTERECTOMY    . APPENDECTOMY    . CHOLECYSTECTOMY    . COLONOSCOPY    . COLONOSCOPY WITH PROPOFOL N/A 07/07/2016   Procedure: COLONOSCOPY WITH PROPOFOL;  Surgeon: Manya Silvas, MD;  Location: [facility] ENDOSCOPY;  Service: Endoscopy;  Laterality: N/A;  . ESOPHAGOGASTRODUODENOSCOPY    . FLEXIBLE SIGMOIDOSCOPY    . nanoflex Bilateral     SOCIAL HISTORY:  Social History  Substance Use Topics  . Smoking status: Never Smoker  . Smokeless tobacco: Never Used  . Alcohol use No    FAMILY HISTORY:  Family  History  Problem Relation Age of Onset  . Breast cancer Paternal Aunt 57  . CVA Mother   . Hypertension Mother   . COPD Father   . CAD Father   . Diabetes Father     DRUG ALLERGIES:  Allergies  Allergen Reactions  . Lipitor [Atorvastatin]   . Penicillins   . Sulfur   . Tricor [Fenofibrate]     Review of Systems  Constitutional: Positive for chills, fever and malaise/fatigue. Negative for weight loss.  HENT: Negative for congestion.   Eyes: Negative for blurred vision and double vision.  Respiratory: Positive for cough, sputum production, shortness of breath and wheezing.   Cardiovascular: Negative for chest pain, palpitations, orthopnea, leg swelling and PND.  Gastrointestinal: Positive for abdominal pain and nausea. Negative for blood in stool, constipation, diarrhea and vomiting.  Genitourinary: Negative for dysuria, frequency, hematuria and urgency.  Musculoskeletal: Positive for falls.  Neurological: Negative for dizziness, tremors, focal weakness and headaches.  Endo/Heme/Allergies: Does not bruise/bleed easily.  Psychiatric/Behavioral: Negative for depression. The patient does not have insomnia.     MEDICATIONS AT HOME:  Prior to Admission medications   Medication Sig Start Date End Date Taking? Authorizing Provider  ALPRAZolam Duanne Moron) 0.5 MG tablet Take 0.5 mg by mouth 2 (two) times daily as needed for sleep. 01/11/17  Yes Historical Provider, MD  buPROPion (WELLBUTRIN SR) 150 MG 12 hr tablet Take 150 mg by mouth daily.    Yes Historical Provider, MD  DULoxetine (CYMBALTA)  60 MG capsule Take 60 mg by mouth daily.   Yes Historical Provider, MD  lisinopril (PRINIVIL,ZESTRIL) 10 MG tablet Take 10 mg by mouth daily.   Yes Historical Provider, MD  metFORMIN (GLUCOPHAGE) 500 MG tablet Take 500 mg by mouth daily.    Yes Historical Provider, MD  Multiple Vitamin (MULTIVITAMIN) capsule Take 1 capsule by mouth daily.   Yes Historical Provider, MD  pantoprazole (PROTONIX) 40 MG  tablet Take 40 mg by mouth 2 (two) times daily.    Yes Historical Provider, MD  polyethylene glycol powder (GLYCOLAX/MIRALAX) powder Take 17 g by mouth every other day. 02/15/17  Yes Historical Provider, MD  pravastatin (PRAVACHOL) 80 MG tablet Take 80 mg by mouth daily.   Yes Historical Provider, MD  topiramate (TOPAMAX) 25 MG tablet Take 25 mg by mouth daily. 03/14/17  Yes Historical Provider, MD  traZODone (DESYREL) 100 MG tablet Take 100 mg by mouth at bedtime.   Yes Historical Provider, MD  triamterene-hydrochlorothiazide (MAXZIDE-25) 37.5-25 MG tablet Take 1 tablet by mouth daily.   Yes Historical Provider, MD  zolpidem (AMBIEN) 10 MG tablet Take 10 mg by mouth at bedtime as needed for sleep.   Yes Historical Provider, MD  acetaminophen-codeine (TYLENOL #3) 300-30 MG tablet Take by mouth every 4 (four) hours as needed for moderate pain.    Historical Provider, MD  nystatin-triamcinolone (MYCOLOG II) cream Apply 1 application topically 2 (two) times daily.    Historical Provider, MD  predniSONE (DELTASONE) 10 MG tablet Label  & dispense according to the schedule below. 5 Pills PO for 1 day then, 4 Pills PO for 1 day, 3 Pills PO for 1 day, 2 Pills PO for 1 day, 1 Pill PO for 1 days then STOP. Patient not taking: Reported on 03/17/2017 02/03/17   Henreitta Leber, MD  promethazine (PHENERGAN) 25 MG suppository Place 25 mg rectally every 6 (six) hours as needed for nausea or vomiting.    Historical Provider, MD  rizatriptan (MAXALT) 10 MG tablet Take 10 mg by mouth every 2 (two) hours as needed. 12/30/16   Historical Provider, MD      PHYSICAL EXAMINATION:   VITAL SIGNS: Blood pressure 125/74, pulse (!) 112, temperature 100 F (37.8 C), temperature source Oral, resp. rate 20, height 4\' 11"  (1.499 m), weight 77.1 kg (170 lb), SpO2 100 %.  GENERAL:  71 y.o.-year-old patient lying in the bed in mild distress, tremulous, uncomfortable, dyspneic.  EYES: Pupils equal, round, reactive to light and  accommodation. No scleral icterus. Extraocular muscles intact.  HEENT: Head atraumatic, normocephalic. Oropharynx and nasopharynx clear.  NECK:  Supple, no jugular venous distention. No thyroid enlargement, no tenderness.  LUNGS: Some diminished breath sounds bilaterally, scattered basilar wheezing, rales,rhonchi , but no crepitations. Intermittent use of accessory muscles of respiration.  CARDIOVASCULAR: S1, S2 normal. No murmurs, rubs, or gallops.  ABDOMEN: Soft, nontender, nondistended. Bowel sounds present. No organomegaly or mass.  EXTREMITIES: No pedal edema, cyanosis, or clubbing.  NEUROLOGIC: Cranial nerves II through XII are intact. Muscle strength 5/5 in all extremities. Sensation intact. Gait not checked. Tremulous PSYCHIATRIC: The patient is alert and oriented x 3.  SKIN: No obvious rash, lesion, or ulcer.   LABORATORY PANEL:   CBC  Recent Labs Lab 03/17/17 1214  WBC 5.9  HGB 13.5  HCT 39.0  PLT 184  MCV 87.1  MCH 30.2  MCHC 34.6  RDW 13.9  LYMPHSABS 0.4*  MONOABS 0.6  EOSABS 0.1  BASOSABS 0.0   ------------------------------------------------------------------------------------------------------------------  Chemistries   Recent Labs Lab 03/17/17 1214  NA 128*  K 3.5  CL 96*  CO2 24  GLUCOSE 114*  BUN 16  CREATININE 1.40*  CALCIUM 8.8*  AST 26  ALT 14  ALKPHOS 58  BILITOT 0.7   ------------------------------------------------------------------------------------------------------------------  Cardiac Enzymes  Recent Labs Lab 03/17/17 1214  TROPONINI <0.03   ------------------------------------------------------------------------------------------------------------------  RADIOLOGY: Dg Chest 2 View  Result Date: 03/17/2017 CLINICAL DATA:  Shortness of breath.  Chest pain. EXAM: CHEST  2 VIEW COMPARISON:  CT 02/01/2017.  Chest x-ray 02/01/2017. FINDINGS: Mediastinum and hilar structures normal. Cardiomegaly. Mild bibasilar atelectasis and/or  scarring. No pleural effusion or pneumothorax. No acute bony abnormality. Surgical clips right upper quadrant. IMPRESSION: 1. Mild bibasilar subsegmental atelectasis and/or scarring. No change from prior exams. 2. Stable cardiomegaly. Electronically Signed   By: Marcello Moores  Register   On: 03/17/2017 12:37    EKG: Orders placed or performed during the hospital encounter of 03/17/17  . ED EKG  . ED EKG   EKG in the emergency room reveals sinus tachycardia at a rate of 115 bpm, left axis deviation, nonspecific ST-T changes in high lateral lead   IMPRESSION AND PLAN:  Active Problems:   Sepsis ([place])   Community acquired pneumonia   COPD with acute exacerbation ([place])   Hypoxia   Hyponatremia   Acute on chronic renal insufficiency   Pneumonia  #1. Sepsis due to community-acquired pneumonia, admitted patient to medical floor, get blood cultures, sputum cultures, initiate patient on levofloxacin, follow culture results and adjust medications as needed #2. Community-acquired pneumonia, initiate levofloxacin intravenously, get sputum cultures, adjust antibiotics as needed #3. COPD exacerbation, started steroids, inhalation therapy, follow clinically #4. Hypoxia, continue oxygen therapy, weaning off oxygen as tolerated, not on oxygen at home #5. Hyponatremia, initiate IV fluids, follow sodium level in the morning #6. Acute on chronic renal insufficiency, underlying CKD, stage II, continue IV fluids, follow creatinine, urinalysis was unremarkable   All the records are reviewed and case discussed with ED provider. Management plans discussed with the patient, family and they are in agreement.  CODE STATUS: Code Status History    Date Active Date Inactive Code Status Order ID Comments User Context   02/01/2017  6:14 PM 02/03/2017  5:45 PM DNR [id number]  Loletha Grayer, MD ED    Questions for Most Recent Historical Code Status (Order [id number])    Question Answer Comment   In the event of cardiac  or respiratory ARREST Do not call a "code blue"    In the event of cardiac or respiratory ARREST Do not perform Intubation, CPR, defibrillation or ACLS    In the event of cardiac or respiratory ARREST Use medication by any route, position, wound care, and other measures to relive pain and suffering. May use oxygen, suction and manual treatment of airway obstruction as needed for comfort.    Comments nurse may pronounce        TOTAL TIME TAKING CARE OF THIS PATIENT: 50 minutes.    Theodoro Grist M.D on 03/17/2017 at 4:37 PM  Between 7am to 6pm - Pager - [phone] After 6pm go to [email or web address] - password [email or web address] [place] [facility]  Office  [phone]  CC: Primary care physician; [name], MD

## 2017-03-17 NOTE — Progress Notes (Signed)
ANTIBIOTIC CONSULT NOTE - INITIAL  Pharmacy Consult for Levaquin  Indication: sepsis  Allergies  Allergen Reactions  . Lipitor [Atorvastatin]   . Penicillins   . Sulfur   . Tricor [Fenofibrate]     Patient Measurements: Height: 4\' 11"  (149.9 cm) Weight: 170 lb (77.1 kg) IBW/kg (Calculated) : 43.2 Adjusted Body Weight:   Vital Signs: Temp: 99.1 F (37.3 C) (03/30 1827) Temp Source: Oral (03/30 1827) BP: 127/74 (03/30 1827) Pulse Rate: 109 (03/30 1827) Intake/Output from previous day: No intake/output data recorded. Intake/Output from this shift: No intake/output data recorded.  Labs:  Recent Labs  03/17/17 1214  WBC 5.9  HGB 13.5  PLT 184  CREATININE 1.40*   Estimated Creatinine Clearance: 33.5 mL/min (A) (by C-G formula based on SCr of 1.4 mg/dL (H)). No results for input(s): VANCOTROUGH, VANCOPEAK, VANCORANDOM, GENTTROUGH, GENTPEAK, GENTRANDOM, TOBRATROUGH, TOBRAPEAK, TOBRARND, AMIKACINPEAK, AMIKACINTROU, AMIKACIN in the last 72 hours.   Microbiology: No results found for this or any previous visit (from the past 720 hour(s)).  Medical History: Past Medical History:  Diagnosis Date  . Anxiety   . Depression   . Diabetes mellitus without complication ([place])   . Elevated lipids   . Fibromyalgia   . Headache   . Hypertension   . OA (osteoarthritis)   . Sleep apnea     Medications:  Scheduled:  . albuterol  2.5 mg Nebulization Q4H  . buPROPion  150 mg Oral Daily  . [START ON 03/18/2017] DULoxetine  60 mg Oral Daily  . enoxaparin (LOVENOX) injection  40 mg Subcutaneous Q24H  . guaiFENesin  600 mg Oral BID  . [START ON 03/18/2017] insulin aspart  0-20 Units Subcutaneous TID WC  . insulin aspart  0-5 Units Subcutaneous QHS  . [START ON 03/18/2017] insulin aspart  6 Units Subcutaneous TID WC  . ipratropium  0.5 mg Nebulization Q4H  . [START ON 03/18/2017] levofloxacin (LEVAQUIN) IV  250 mg Intravenous Q24H  . [START ON 03/18/2017] metFORMIN  500 mg Oral Q  breakfast  . [START ON 03/18/2017] methylPREDNISolone (SOLU-MEDROL) injection  60 mg Intravenous Q12H  . nystatin-triamcinolone  1 application Topical BID  . pantoprazole  40 mg Oral BID  . polyethylene glycol  17 g Oral QODAY  . [START ON 03/18/2017] pravastatin  80 mg Oral Daily  . sodium chloride flush  3 mL Intravenous Q12H  . topiramate  25 mg Oral Daily  . traZODone  100 mg Oral QHS   Assessment: Pharmacy consulted to dose levaquin in this 71 year old female with sepsis secondary to CAP.  CrCl = 33.5 ml/min   Goal of Therapy:  resolution of infection  Plan:  Expected duration 7 days with resolution of temperature and/or normalization of WBC   Levaquin 750 mg IV X 1 given on 3/30 @ 16:00. Levaquin 250 mg IV Q24H ordered to start on 3/31 @ 18:00.   Sara Mcdonald D 03/17/2017,6:56 PM

## 2017-03-17 NOTE — ED Notes (Signed)
Pt unable to void at this time. 

## 2017-03-17 NOTE — ED Notes (Signed)
CODE SEPSIS CALLED TO SARA AT [facility]

## 2017-03-17 NOTE — ED Provider Notes (Signed)
[name] Emergency Department Provider Note ____________________________________________   I have reviewed the triage vital signs and the triage nursing note.  HISTORY  Chief Complaint Cough and Fever   Historian Patient and daughter  HPI Sara Mcdonald is a 71 y.o. female with a history of obstructive sleep apnea, fiber milder, diabetes, and a history of prior episode of bronchitis with hypoxia last year, presents today with cough and trouble breathing for about 3 or 4 days. Yesterday she had a fever to 102. She's not been bringing anything up. No vomiting. No abdominal pain. No chest pain other than tightness. No pleuritic chest pain. States that she has no history of emphysema or asthma or inhaler use.  No home O2 use.   This episode feels similar to when she was diagnosed with bronchitis and admitted to the hospital due to low oxygen and was given steroids per the patient as well as antibiotics.  She did receive her flu shot this year.    Past Medical History:  Diagnosis Date  . Anxiety   . Depression   . Diabetes mellitus without complication ([place])   . Elevated lipids   . Fibromyalgia   . Headache   . Hypertension   . OA (osteoarthritis)   . Sleep apnea     Patient Active Problem List   Diagnosis Date Noted  . Acute respiratory failure with hypoxia ([place]) 02/01/2017    Past Surgical History:  Procedure Laterality Date  . ABDOMINAL HYSTERECTOMY    . APPENDECTOMY    . CHOLECYSTECTOMY    . COLONOSCOPY    . COLONOSCOPY WITH PROPOFOL N/A 07/07/2016   Procedure: COLONOSCOPY WITH PROPOFOL;  Surgeon: Manya Silvas, MD;  [facility] END[facility]COPY;  Service: Endoscopy;  Laterality: N/A;  . ESOPHAGOGASTRODUODENOSCOPY    . FLEXIBLE SIGMOIDOSCOPY    . nanoflex Bilateral     Prior to Admission medications   Medication Sig Start Date End Date Taking? Authorizing Provider  ALPRAZolam Duanne Moron) 0.5 MG tablet Take 0.5 mg by mouth 2 (two) times  daily as needed for sleep. 01/11/17  Yes Historical Provider, MD  buPROPion (WELLBUTRIN SR) 150 MG 12 hr tablet Take 150 mg by mouth daily.    Yes Historical Provider, MD  DULoxetine (CYMBALTA) 60 MG capsule Take 60 mg by mouth daily.   Yes Historical Provider, MD  lisinopril (PRINIVIL,ZESTRIL) 10 MG tablet Take 10 mg by mouth daily.   Yes Historical Provider, MD  metFORMIN (GLUCOPHAGE) 500 MG tablet Take 500 mg by mouth daily.    Yes Historical Provider, MD  Multiple Vitamin (MULTIVITAMIN) capsule Take 1 capsule by mouth daily.   Yes Historical Provider, MD  pantoprazole (PROTONIX) 40 MG tablet Take 40 mg by mouth 2 (two) times daily.    Yes Historical Provider, MD  polyethylene glycol powder (GLYCOLAX/MIRALAX) powder Take 17 g by mouth every other day. 02/15/17  Yes Historical Provider, MD  pravastatin (PRAVACHOL) 80 MG tablet Take 80 mg by mouth daily.   Yes Historical Provider, MD  topiramate (TOPAMAX) 25 MG tablet Take 25 mg by mouth daily. 03/14/17  Yes Historical Provider, MD  traZODone (DESYREL) 100 MG tablet Take 100 mg by mouth at bedtime.   Yes Historical Provider, MD  triamterene-hydrochlorothiazide (MAXZIDE-25) 37.5-25 MG tablet Take 1 tablet by mouth daily.   Yes Historical Provider, MD  zolpidem (AMBIEN) 10 MG tablet Take 10 mg by mouth at bedtime as needed for sleep.   Yes Historical Provider, MD  acetaminophen-codeine (TYLENOL #3) 300-30  MG tablet Take by mouth every 4 (four) hours as needed for moderate pain.    Historical Provider, MD  nystatin-triamcinolone (MYCOLOG II) cream Apply 1 application topically 2 (two) times daily.    Historical Provider, MD  predniSONE (DELTASONE) 10 MG tablet Label  & dispense according to the schedule below. 5 Pills PO for 1 day then, 4 Pills PO for 1 day, 3 Pills PO for 1 day, 2 Pills PO for 1 day, 1 Pill PO for 1 days then STOP. Patient not taking: Reported on 03/17/2017 02/03/17   Henreitta Leber, MD  promethazine (PHENERGAN) 25 MG suppository Place  25 mg rectally every 6 (six) hours as needed for nausea or vomiting.    Historical Provider, MD  rizatriptan (MAXALT) 10 MG tablet Take 10 mg by mouth every 2 (two) hours as needed. 12/30/16   Historical Provider, MD    Allergies  Allergen Reactions  . Lipitor [Atorvastatin]   . Penicillins   . Sulfur   . Tricor [Fenofibrate]     Family History  Problem Relation Age of Onset  . Breast cancer Paternal Aunt 30  . CVA Mother   . Hypertension Mother   . COPD Father   . CAD Father   . Diabetes Father     Social History Social History  Substance Use Topics  . Smoking status: Never Smoker  . Smokeless tobacco: Never Used  . Alcohol use No    Review of Systems  Constitutional: Positive for fever. Eyes: Negative for visual changes. ENT: Negative for sore throat. Cardiovascular: Negative for chest pain. Respiratory: Negative for shortness of breath. Gastrointestinal: Negative for abdominal pain, vomiting and diarrhea. Genitourinary: Negative for dysuria. Musculoskeletal: Negative for back pain. Skin: Negative for rash. Neurological: Negative for headache. 10 point Review of Systems otherwise negative ____________________________________________   PHYSICAL EXAM:  VITAL SIGNS: ED Triage Vitals  Enc Vitals Group     BP 03/17/17 1204 (!) 95/57     Pulse Rate 03/17/17 1204 (!) 106     Resp 03/17/17 1204 20     Temp 03/17/17 1204 100 F (37.8 C)     Temp Source 03/17/17 1204 Oral     SpO2 03/17/17 1204 (!) 89 %     Weight 03/17/17 1205 170 lb (77.1 kg)     Height 03/17/17 1205 4\' 11"  (1.499 m)     Head Circumference --      Peak Flow --      Pain Score 03/17/17 1205 0     Pain Loc --      Pain Edu? --      Excl. in [place]? --      Constitutional: Alert and oriented. Well appearing and in no distress. HEENT   Head: Normocephalic and atraumatic.      Eyes: Conjunctivae are normal. PERRL. Normal extraocular movements.      Ears:         Nose: No  congestion/rhinnorhea.   Mouth/Throat: Mucous membranes are moist.   Neck: No stridor. Cardiovascular/Chest: Tachycardic, regular rhythm.  No murmurs, rubs, or gallops. Respiratory: No retractions.  Mild tachypnea.  No ronchi, but mild decreased air movement without wheezing. Gastrointestinal: Soft. No distention, no guarding, no rebound. Nontender.    Genitourinary/rectal:Deferred Musculoskeletal: Nontender with normal range of motion in all extremities. No joint effusions.  No lower extremity tenderness.  No edema. Neurologic:  Normal speech and language. No gross or focal neurologic deficits are appreciated. Skin:  Skin is warm, dry and intact.  No rash noted. Psychiatric: Mood and affect are normal. Speech and behavior are normal. Patient exhibits appropriate insight and judgment.   ____________________________________________  LABS (pertinent positives/negatives)  Labs Reviewed  COMPREHENSIVE METABOLIC PANEL - Abnormal; Notable for the following:       Result Value   Sodium 128 (*)    Chloride 96 (*)    Glucose, Bld 114 (*)    Creatinine, Ser 1.40 (*)    Calcium 8.8 (*)    Total Protein 6.4 (*)    GFR calc non Af Amer 37 (*)    GFR calc Af Amer 43 (*)    All other components within normal limits  CBC WITH DIFFERENTIAL/PLATELET - Abnormal; Notable for the following:    Lymphs Abs 0.4 (*)    All other components within normal limits  URINALYSIS, COMPLETE (UACMP) WITH MICROSCOPIC - Abnormal; Notable for the following:    Color, Urine YELLOW (*)    APPearance HAZY (*)    Squamous Epithelial / LPF 0-5 (*)    All other components within normal limits  CULTURE, BLOOD (ROUTINE X 2)  CULTURE, BLOOD (ROUTINE X 2)  URINE CULTURE  LACTIC ACID, PLASMA  TROPONIN I    ____________________________________________    EKG I, Lisa Roca, MD, the attending physician have personally viewed and interpreted all ECGs.  115 bpm. Sinus tachycardia. Left axis deviation. Nonspecific  ST and T-wave ____________________________________________  RADIOLOGY All Xrays were viewed by me. Imaging interpreted by Radiologist.  Chest x-ray two-view: IMPRESSION: 1. Mild bibasilar subsegmental atelectasis and/or scarring. No change from prior exams.  2. Stable cardiomegaly. __________________________________________  PROCEDURES  Procedure(s) performed: None  Critical Care performed: None  ____________________________________________   ED COURSE / ASSESSMENT AND PLAN  Pertinent labs & imaging results that were available during my care of the patient were reviewed by me and considered in my medical decision making (see chart for details).  Ms. Gopal presents with fever yesterday, low-grade temperature 100 here with tachycardia and hypotension as well as hypoxia.  On 2 L nasal cannula, O2 sats are in the mid 90s. Something she does not have a history of known lung problems, however has been admitted in the past for concern for sepsis and bronchitis with hypoxia.  Patient states that she was started on doxycycline in the last 2 days, but has been getting worse instead of better.  She states that she got better with steroids and breathing treatment in the past, and so I will go ahead and treat her with Solu-Medrol and DuoNeb. Her breath sounds are tight without active wheezing, but she is not in distress.  Given initial hypotensive and tachycardia with fever, and hypoxia going to place her on sepsis pathway. Her story seems a likely source given her symptoms are coughing and short of breath. Patient was recently on doxycycline, and reports an allergy to penicillins. I will give her Levaquin.  She was started on the sepsis pathway for monitoring and admission.  CONSULTATIONS:  Hospitalist for admission.  Patient / Family / Caregiver informed of clinical course, medical decision-making process, and agree with  plan.    ___________________________________________   FINAL CLINICAL IMPRESSION(S) / ED DIAGNOSES   Final diagnoses:  Hypoxia  Bronchitis  Sepsis, due to unspecified organism [facility])              Note: This dictation was prepared with Dragon dictation. Any transcriptional errors that result from this process are unintentional    Lisa Roca, MD 03/17/17 [phone]

## 2017-03-17 NOTE — ED Notes (Signed)
Pt placed on bed pan to try to urinate

## 2017-03-18 DIAGNOSIS — R05 Cough: Secondary | ICD-10-CM | POA: Diagnosis not present

## 2017-03-18 DIAGNOSIS — A419 Sepsis, unspecified organism: Secondary | ICD-10-CM | POA: Diagnosis not present

## 2017-03-18 DIAGNOSIS — J189 Pneumonia, unspecified organism: Secondary | ICD-10-CM | POA: Diagnosis not present

## 2017-03-18 DIAGNOSIS — J441 Chronic obstructive pulmonary disease with (acute) exacerbation: Secondary | ICD-10-CM | POA: Diagnosis not present

## 2017-03-18 LAB — BASIC METABOLIC PANEL
Anion gap: 7 (ref 5–15)
BUN: 15 mg/dL (ref 6–20)
CALCIUM: 7.9 mg/dL — AB (ref 8.9–10.3)
CO2: 24 mmol/L (ref 22–32)
CREATININE: 1.09 mg/dL — AB (ref 0.44–1.00)
Chloride: 101 mmol/L (ref 101–111)
GFR calc Af Amer: 58 mL/min — ABNORMAL LOW (ref >60)
GFR calc non Af Amer: 50 mL/min — ABNORMAL LOW (ref >60)
Glucose, Bld: 170 mg/dL — ABNORMAL HIGH (ref 65–99)
Potassium: 3.4 mmol/L — ABNORMAL LOW (ref 3.5–5.1)
Sodium: 132 mmol/L — ABNORMAL LOW (ref 135–145)

## 2017-03-18 LAB — CBC
HCT: 35 % (ref 35.0–47.0)
Hemoglobin: 12 g/dL (ref 12.0–16.0)
MCH: 30.1 pg (ref 26.0–34.0)
MCHC: 34.4 g/dL (ref 32.0–36.0)
MCV: 87.5 fL (ref 80.0–100.0)
Platelets: 114 10*3/uL — ABNORMAL LOW (ref 150–440)
RBC: 4 MIL/uL (ref 3.80–5.20)
RDW: 13.8 % (ref 11.5–14.5)
WBC: 2.5 10*3/uL — ABNORMAL LOW (ref 3.6–11.0)

## 2017-03-18 LAB — URINE CULTURE: Culture: NO GROWTH

## 2017-03-18 LAB — GLUCOSE, CAPILLARY: GLUCOSE-CAPILLARY: 142 mg/dL — AB (ref 65–99)

## 2017-03-18 MED ORDER — LEVOFLOXACIN 750 MG PO TABS: 750.0000 mg | ORAL_TABLET | ORAL | 0 refills | Status: AC

## 2017-03-18 MED ORDER — PREDNISONE 20 MG PO TABS: 40.0000 mg | ORAL_TABLET | Freq: Every day | ORAL | 0 refills | Status: AC

## 2017-03-18 NOTE — Progress Notes (Signed)
Discharge instructions along with home medications and follow up gone over with patient and family member. Both verbalize that they understood instructions. One prescription given to patient. IV and tele removed. Pt being discharged home on room air, no distress noted. Ammie Dalton, RN

## 2017-03-18 NOTE — Discharge Summary (Signed)
[place] [facility] [place] NAME: Sara Mcdonald    MR#:  [id number]  DATE OF BIRTH:  1946-10-06  DATE OF ADMISSION:  03/17/2017 ADMITTING PHYSICIAN: Theodoro Grist, MD  DATE OF DISCHARGE: 03/18/2017  PRIMARY CARE PHYSICIAN: [name], MD    ADMISSION DIAGNOSIS:  Bronchitis [J40] Hypoxia [R09.02] Sepsis, due to unspecified organism [facility]) [A41.9]  DISCHARGE DIAGNOSIS:  Active Problems:   Sepsis ([place])   Community acquired pneumonia    Hypoxia   Hyponatremia   Acute on chronic renal insufficiency   Pneumonia   SECONDARY DIAGNOSIS:   Past Medical History:  Diagnosis Date  . Anxiety   . Depression   . Diabetes mellitus without complication ([place])   . Elevated lipids   . Fibromyalgia   . Headache   . Hypertension   . OA (osteoarthritis)   . Sleep apnea     HOSPITAL COURSE:   71 year old female with a history of diabetes and anxiety who presented with shortness of breath.   1. Sepsis: Patient presented with fever and tachycardia. Sepsis is due to community-acquired pneumonia. Sepsis is resolved  2. Acute hypoxic respiratory failure in the setting of community-acquired pneumonia with bronchospasm. Patient denies history of COPD. Wheezing has improved. She is discharged with 40 mg of prednisone for 4 days. She has been weaned off of oxygen  3. Community-acquired pneumonia: Patient will continue on Levaquin for a 5 day treatment.  4. Acute kidney injury: This is resolved   5. Hyponatremia from decreased oral intake: Her sodium level has improved but not quite normalized and therefore HCTZ has been discontinued for now. She can restart this at her follow-up visit with her PCP early X week.   6 diabetes: She'll continue outpatient medications with ADA diet   7. Anxiety and depression: Patient will continue on Xanax, Wellbutrin and Cymbalta  DISCHARG E CONDITIONS AND DIET:  Stable Diabetic diet  CONSULTS OBTAINED:    DRUG  ALLERGIES:   Allergies  Allergen Reactions  . Lipitor [Atorvastatin]   . Penicillins   . Sulfur   . Tricor [Fenofibrate]     DISCHARGE MEDICATIONS:   Current Discharge Medication List    START taking these medications   Details  levofloxacin (LEVAQUIN) 750 MG tablet Take 1 tablet (750 mg total) by mouth every other day. Qty: 2 tablet, Refills: 0      CONTINUE these medications which have CHANGED   Details  predniSONE (DELTASONE) 20 MG tablet Take 2 tablets (40 mg total) by mouth daily with breakfast. Qty: 8 tablet, Refills: 0      CONTINUE these medications which have NOT CHANGED   Details  ALPRAZolam (XANAX) 0.5 MG tablet Take 0.5 mg by mouth 2 (two) times daily as needed for sleep. Refills: 5    buPROPion (WELLBUTRIN SR) 150 MG 12 hr tablet Take 150 mg by mouth daily.     DULoxetine (CYMBALTA) 60 MG capsule Take 60 mg by mouth daily.    lisinopril (PRINIVIL,ZESTRIL) 10 MG tablet Take 10 mg by mouth daily.    metFORMIN (GLUCOPHAGE) 500 MG tablet Take 500 mg by mouth daily.     Multiple Vitamin (MULTIVITAMIN) capsule Take 1 capsule by mouth daily.    pantoprazole (PROTONIX) 40 MG tablet Take 40 mg by mouth 2 (two) times daily.     polyethylene glycol powder (GLYCOLAX/MIRALAX) powder Take 17 g by mouth every other day.    pravastatin (PRAVACHOL) 80 MG tablet Take 80 mg by mouth  daily.    topiramate (TOPAMAX) 25 MG tablet Take 25 mg by mouth daily.    traZODone (DESYREL) 100 MG tablet Take 100 mg by mouth at bedtime.    zolpidem (AMBIEN) 10 MG tablet Take 10 mg by mouth at bedtime as needed for sleep.    acetaminophen-codeine (TYLENOL #3) 300-30 MG tablet Take by mouth every 4 (four) hours as needed for moderate pain.    nystatin-triamcinolone (MYCOLOG II) cream Apply 1 application topically 2 (two) times daily.    promethazine (PHENERGAN) 25 MG suppository Place 25 mg rectally every 6 (six) hours as needed for nausea or vomiting.    rizatriptan (MAXALT) 10 MG  tablet Take 10 mg by mouth every 2 (two) hours as needed.      STOP taking these medications     triamterene-hydrochlorothiazide (MAXZIDE-25) 37.5-25 MG tablet           Today   CHIEF COMPLAINT:  Doing better this am not SOB still with cough Doe not have COPD nor has she ever smoked   VITAL SIGNS:  Blood pressure 105/68, pulse 100, temperature 97.7 F (36.5 C), temperature source Oral, resp. rate 20, height 4\' 11"  (1.499 m), weight 77.1 kg (170 lb), SpO2 93 %.   REVIEW OF SYSTEMS:  Review of Systems  Constitutional: Negative.  Negative for chills, fever and malaise/fatigue.  HENT: Negative.  Negative for ear discharge, ear pain, hearing loss, nosebleeds and sore throat.   Eyes: Negative.  Negative for blurred vision and pain.  Respiratory: Positive for cough. Negative for hemoptysis, shortness of breath and wheezing.   Cardiovascular: Negative.  Negative for chest pain, palpitations and leg swelling.  Gastrointestinal: Negative.  Negative for abdominal pain, blood in stool, diarrhea, nausea and vomiting.  Genitourinary: Negative.  Negative for dysuria.  Musculoskeletal: Negative.  Negative for back pain.  Skin: Negative.   Neurological: Negative for dizziness, tremors, speech change, focal weakness, seizures and headaches.  Endo/Heme/Allergies: Negative.  Does not bruise/bleed easily.  Psychiatric/Behavioral: Negative.  Negative for depression, hallucinations and suicidal ideas.     PHYSICAL EXAMINATION:  GENERAL:  71 y.o.-year-old patient lying in the bed with no acute distress.  NECK:  Supple, no jugular venous distention. No thyroid enlargement, no tenderness.  LUNGS: Normal breath sounds bilaterally, no wheezing, rales,rhonchi  No use of accessory muscles of respiration.  CARDIOVASCULAR: S1, S2 normal. No murmurs, rubs, or gallops.  ABDOMEN: Soft, non-tender, non-distended. Bowel sounds present. No organomegaly or mass.  EXTREMITIES: No pedal edema, cyanosis, or  clubbing.  PSYCHIATRIC: The patient is alert and oriented x 3.  SKIN: No obvious rash, lesion, or ulcer.   DATA REVIEW:   CBC  Recent Labs Lab 03/18/17 0404  WBC 2.5*  HGB 12.0  HCT 35.0  PLT 114*    Chemistries   Recent Labs Lab 03/17/17 1214 03/18/17 0404  NA 128* 132*  K 3.5 3.4*  CL 96* 101  CO2 24 24  GLUCOSE 114* 170*  BUN 16 15  CREATININE 1.40* 1.09*  CALCIUM 8.8* 7.9*  AST 26  --   ALT 14  --   ALKPHOS 58  --   BILITOT 0.7  --     Cardiac Enzymes  Recent Labs Lab 03/17/17 1214  TROPONINI <0.03    Microbiology Results  @MICRORSLT48 @  RADIOLOGY:  Dg Chest 2 View  Result Date: 03/17/2017 CLINICAL DATA:  Shortness of breath.  Chest pain. EXAM: CHEST  2 VIEW COMPARISON:  CT 02/01/2017.  Chest x-ray 02/01/2017. FINDINGS: Mediastinum  and hilar structures normal. Cardiomegaly. Mild bibasilar atelectasis and/or scarring. No pleural effusion or pneumothorax. No acute bony abnormality. Surgical clips right upper quadrant. IMPRESSION: 1. Mild bibasilar subsegmental atelectasis and/or scarring. No change from prior exams. 2. Stable cardiomegaly. Electronically Signed   By: Marcello Moores  Register   On: 03/17/2017 12:37      Current Discharge Medication List    START taking these medications   Details  levofloxacin (LEVAQUIN) 750 MG tablet Take 1 tablet (750 mg total) by mouth every other day. Qty: 2 tablet, Refills: 0      CONTINUE these medications which have CHANGED   Details  predniSONE (DELTASONE) 20 MG tablet Take 2 tablets (40 mg total) by mouth daily with breakfast. Qty: 8 tablet, Refills: 0      CONTINUE these medications which have NOT CHANGED   Details  ALPRAZolam (XANAX) 0.5 MG tablet Take 0.5 mg by mouth 2 (two) times daily as needed for sleep. Refills: 5    buPROPion (WELLBUTRIN SR) 150 MG 12 hr tablet Take 150 mg by mouth daily.     DULoxetine (CYMBALTA) 60 MG capsule Take 60 mg by mouth daily.    lisinopril (PRINIVIL,ZESTRIL) 10 MG  tablet Take 10 mg by mouth daily.    metFORMIN (GLUCOPHAGE) 500 MG tablet Take 500 mg by mouth daily.     Multiple Vitamin (MULTIVITAMIN) capsule Take 1 capsule by mouth daily.    pantoprazole (PROTONIX) 40 MG tablet Take 40 mg by mouth 2 (two) times daily.     polyethylene glycol powder (GLYCOLAX/MIRALAX) powder Take 17 g by mouth every other day.    pravastatin (PRAVACHOL) 80 MG tablet Take 80 mg by mouth daily.    topiramate (TOPAMAX) 25 MG tablet Take 25 mg by mouth daily.    traZODone (DESYREL) 100 MG tablet Take 100 mg by mouth at bedtime.    zolpidem (AMBIEN) 10 MG tablet Take 10 mg by mouth at bedtime as needed for sleep.    acetaminophen-codeine (TYLENOL #3) 300-30 MG tablet Take by mouth every 4 (four) hours as needed for moderate pain.    nystatin-triamcinolone (MYCOLOG II) cream Apply 1 application topically 2 (two) times daily.    promethazine (PHENERGAN) 25 MG suppository Place 25 mg rectally every 6 (six) hours as needed for nausea or vomiting.    rizatriptan (MAXALT) 10 MG tablet Take 10 mg by mouth every 2 (two) hours as needed.      STOP taking these medications     triamterene-hydrochlorothiazide (MAXZIDE-25) 37.5-25 MG tablet          Management plans discussed with the patient and she is in agreement. Stable for discharge home  Patient should follow up with pcp  CODE STATUS:     Code Status Orders        Start     Ordered   03/17/17 1828  Full code  Continuous     03/17/17 1827    Code Status History    Date Active Date Inactive Code Status Order ID Comments User Context   02/01/2017  6:14 PM 02/03/2017  5:45 PM DNR [id number]  Loletha Grayer, MD ED    Advance Directive Documentation     Most Recent Value  Type of Advance Directive  Healthcare Power of [place], Living will  Pre-existing out of facility DNR order (yellow form or pink MOST form)  -  "MOST" Form in Place?  -      TOTAL TIME TAKING CARE OF THIS PATIENT: 37 minutes.  Note: This dictation was prepared with Dragon dictation along with smaller phrase technology. Any transcriptional errors that result from this process are unintentional.  Finn Altemose M.D on 03/18/2017 at 8:11 AM  Between 7am to 6pm - Pager - [phone] After 6pm go to [email or web address] - password [email or web address] [facility] [facility]  Office  [phone]  CC: Primary care physician; [name], MD

## 2017-03-19 LAB — HEMOGLOBIN A1C
HEMOGLOBIN A1C: 5.7 % — AB (ref 4.8–5.6)
Mean Plasma Glucose: 117 mg/dL

## 2017-03-21 DIAGNOSIS — J9601 Acute respiratory failure with hypoxia: Secondary | ICD-10-CM | POA: Diagnosis not present

## 2017-03-21 DIAGNOSIS — J159 Unspecified bacterial pneumonia: Secondary | ICD-10-CM | POA: Diagnosis not present

## 2017-03-21 DIAGNOSIS — J189 Pneumonia, unspecified organism: Secondary | ICD-10-CM | POA: Diagnosis not present

## 2017-03-21 DIAGNOSIS — E871 Hypo-osmolality and hyponatremia: Secondary | ICD-10-CM | POA: Diagnosis not present

## 2017-03-22 LAB — CULTURE, BLOOD (ROUTINE X 2)
CULTURE: NO GROWTH
Culture: NO GROWTH

## 2017-04-14 DIAGNOSIS — J9801 Acute bronchospasm: Secondary | ICD-10-CM | POA: Diagnosis not present

## 2017-04-14 DIAGNOSIS — E871 Hypo-osmolality and hyponatremia: Secondary | ICD-10-CM | POA: Diagnosis not present

## 2017-05-10 ENCOUNTER — Other Ambulatory Visit: Payer: Self-pay | Admitting: Internal Medicine

## 2017-05-10 DIAGNOSIS — Z1231 Encounter for screening mammogram for malignant neoplasm of breast: Secondary | ICD-10-CM

## 2017-06-01 ENCOUNTER — Ambulatory Visit: Payer: PPO

## 2017-09-07 DIAGNOSIS — E119 Type 2 diabetes mellitus without complications: Secondary | ICD-10-CM | POA: Diagnosis not present

## 2017-09-14 DIAGNOSIS — Z9989 Dependence on other enabling machines and devices: Secondary | ICD-10-CM | POA: Diagnosis not present

## 2017-09-14 DIAGNOSIS — G4733 Obstructive sleep apnea (adult) (pediatric): Secondary | ICD-10-CM | POA: Diagnosis not present

## 2017-09-14 DIAGNOSIS — E782 Mixed hyperlipidemia: Secondary | ICD-10-CM | POA: Diagnosis not present

## 2017-09-14 DIAGNOSIS — Z23 Encounter for immunization: Secondary | ICD-10-CM | POA: Diagnosis not present

## 2017-09-14 DIAGNOSIS — E119 Type 2 diabetes mellitus without complications: Secondary | ICD-10-CM | POA: Diagnosis not present

## 2017-09-14 DIAGNOSIS — M5414 Radiculopathy, thoracic region: Secondary | ICD-10-CM | POA: Diagnosis not present

## 2018-03-12 DIAGNOSIS — E119 Type 2 diabetes mellitus without complications: Secondary | ICD-10-CM | POA: Diagnosis not present

## 2018-03-12 DIAGNOSIS — E782 Mixed hyperlipidemia: Secondary | ICD-10-CM | POA: Diagnosis not present

## 2018-04-30 DIAGNOSIS — E119 Type 2 diabetes mellitus without complications: Secondary | ICD-10-CM | POA: Diagnosis not present

## 2018-04-30 DIAGNOSIS — F331 Major depressive disorder, recurrent, moderate: Secondary | ICD-10-CM | POA: Diagnosis not present

## 2018-04-30 DIAGNOSIS — M797 Fibromyalgia: Secondary | ICD-10-CM | POA: Diagnosis not present

## 2018-04-30 DIAGNOSIS — E782 Mixed hyperlipidemia: Secondary | ICD-10-CM | POA: Diagnosis not present

## 2018-04-30 DIAGNOSIS — N183 Chronic kidney disease, stage 3 (moderate): Secondary | ICD-10-CM | POA: Diagnosis not present

## 2018-04-30 DIAGNOSIS — Z0000 Encounter for general adult medical examination without abnormal findings: Secondary | ICD-10-CM | POA: Diagnosis not present

## 2018-07-03 DIAGNOSIS — R5382 Chronic fatigue, unspecified: Secondary | ICD-10-CM | POA: Diagnosis not present

## 2018-07-03 DIAGNOSIS — R5381 Other malaise: Secondary | ICD-10-CM | POA: Diagnosis not present

## 2018-10-25 DIAGNOSIS — E119 Type 2 diabetes mellitus without complications: Secondary | ICD-10-CM | POA: Diagnosis not present

## 2018-10-25 DIAGNOSIS — E782 Mixed hyperlipidemia: Secondary | ICD-10-CM | POA: Diagnosis not present

## 2018-10-31 DIAGNOSIS — E1169 Type 2 diabetes mellitus with other specified complication: Secondary | ICD-10-CM | POA: Diagnosis not present

## 2018-10-31 DIAGNOSIS — Z23 Encounter for immunization: Secondary | ICD-10-CM | POA: Diagnosis not present

## 2018-10-31 DIAGNOSIS — G4733 Obstructive sleep apnea (adult) (pediatric): Secondary | ICD-10-CM | POA: Diagnosis not present

## 2018-10-31 DIAGNOSIS — R0602 Shortness of breath: Secondary | ICD-10-CM | POA: Diagnosis not present

## 2018-10-31 DIAGNOSIS — E782 Mixed hyperlipidemia: Secondary | ICD-10-CM | POA: Diagnosis not present

## 2018-10-31 DIAGNOSIS — Z9989 Dependence on other enabling machines and devices: Secondary | ICD-10-CM | POA: Diagnosis not present

## 2018-10-31 DIAGNOSIS — N183 Chronic kidney disease, stage 3 (moderate): Secondary | ICD-10-CM | POA: Diagnosis not present

## 2018-11-20 DIAGNOSIS — R0602 Shortness of breath: Secondary | ICD-10-CM | POA: Diagnosis not present

## 2018-11-20 DIAGNOSIS — R0609 Other forms of dyspnea: Secondary | ICD-10-CM | POA: Diagnosis not present

## 2018-12-04 DIAGNOSIS — R5381 Other malaise: Secondary | ICD-10-CM | POA: Diagnosis not present

## 2018-12-04 DIAGNOSIS — E1169 Type 2 diabetes mellitus with other specified complication: Secondary | ICD-10-CM | POA: Diagnosis not present

## 2018-12-04 DIAGNOSIS — R5382 Chronic fatigue, unspecified: Secondary | ICD-10-CM | POA: Diagnosis not present

## 2018-12-04 DIAGNOSIS — E782 Mixed hyperlipidemia: Secondary | ICD-10-CM | POA: Diagnosis not present

## 2018-12-07 DIAGNOSIS — R0602 Shortness of breath: Secondary | ICD-10-CM | POA: Diagnosis not present

## 2018-12-07 DIAGNOSIS — R0609 Other forms of dyspnea: Secondary | ICD-10-CM | POA: Diagnosis not present

## 2018-12-13 ENCOUNTER — Other Ambulatory Visit: Payer: Self-pay | Admitting: Internal Medicine

## 2018-12-13 DIAGNOSIS — Z1231 Encounter for screening mammogram for malignant neoplasm of breast: Secondary | ICD-10-CM

## 2018-12-17 DIAGNOSIS — Z9989 Dependence on other enabling machines and devices: Secondary | ICD-10-CM | POA: Diagnosis not present

## 2018-12-17 DIAGNOSIS — R197 Diarrhea, unspecified: Secondary | ICD-10-CM | POA: Diagnosis not present

## 2018-12-17 DIAGNOSIS — R29898 Other symptoms and signs involving the musculoskeletal system: Secondary | ICD-10-CM | POA: Diagnosis not present

## 2018-12-17 DIAGNOSIS — K219 Gastro-esophageal reflux disease without esophagitis: Secondary | ICD-10-CM | POA: Diagnosis not present

## 2018-12-17 DIAGNOSIS — G4733 Obstructive sleep apnea (adult) (pediatric): Secondary | ICD-10-CM | POA: Diagnosis not present

## 2018-12-18 ENCOUNTER — Other Ambulatory Visit: Payer: Self-pay | Admitting: Pulmonary Disease

## 2018-12-18 DIAGNOSIS — R1310 Dysphagia, unspecified: Secondary | ICD-10-CM

## 2018-12-26 DIAGNOSIS — R197 Diarrhea, unspecified: Secondary | ICD-10-CM | POA: Diagnosis not present

## 2018-12-26 DIAGNOSIS — E089 Diabetes mellitus due to underlying condition without complications: Secondary | ICD-10-CM | POA: Diagnosis not present

## 2018-12-26 DIAGNOSIS — E119 Type 2 diabetes mellitus without complications: Secondary | ICD-10-CM | POA: Diagnosis not present

## 2018-12-26 DIAGNOSIS — H524 Presbyopia: Secondary | ICD-10-CM | POA: Diagnosis not present

## 2018-12-26 DIAGNOSIS — H40013 Open angle with borderline findings, low risk, bilateral: Secondary | ICD-10-CM | POA: Diagnosis not present

## 2019-01-10 ENCOUNTER — Ambulatory Visit
Admission: RE | Admit: 2019-01-10 | Discharge: 2019-01-10 | Disposition: A | Discharge status: Home / Self Care | Payer: PPO | Source: Ambulatory Visit | Attending: Pulmonary Disease | Admitting: Pulmonary Disease

## 2019-01-10 DIAGNOSIS — R1312 Dysphagia, oropharyngeal phase: Secondary | ICD-10-CM | POA: Diagnosis not present

## 2019-01-10 DIAGNOSIS — R05 Cough: Secondary | ICD-10-CM | POA: Diagnosis not present

## 2019-01-10 DIAGNOSIS — R1310 Dysphagia, unspecified: Secondary | ICD-10-CM | POA: Diagnosis not present

## 2019-01-10 IMAGING — RF DG SWALLOWING FUNCTION
1 series · 1 of 1 positions shown · non-contrast
Comparison: None.

CLINICAL DATA: Dysphagia

EXAM:
MODIFIED BARIUM SWALLOW
TECHNIQUE: Different consistencies of barium were administered orally to the
patient by the Speech Pathologist. Imaging of the pharynx was
performed in the lateral projection.
FLUOROSCOPY TIME:  Fluoroscopy Time:  0 minutes 48 seconds
Radiation Exposure Index (if provided by the fluoroscopic device):
2.00 mGy
Number of Acquired Spot Images: 1

[Series 1: cp_standard · 0.17mm/px · 1 of 1 slices shown]
[im 1/1]
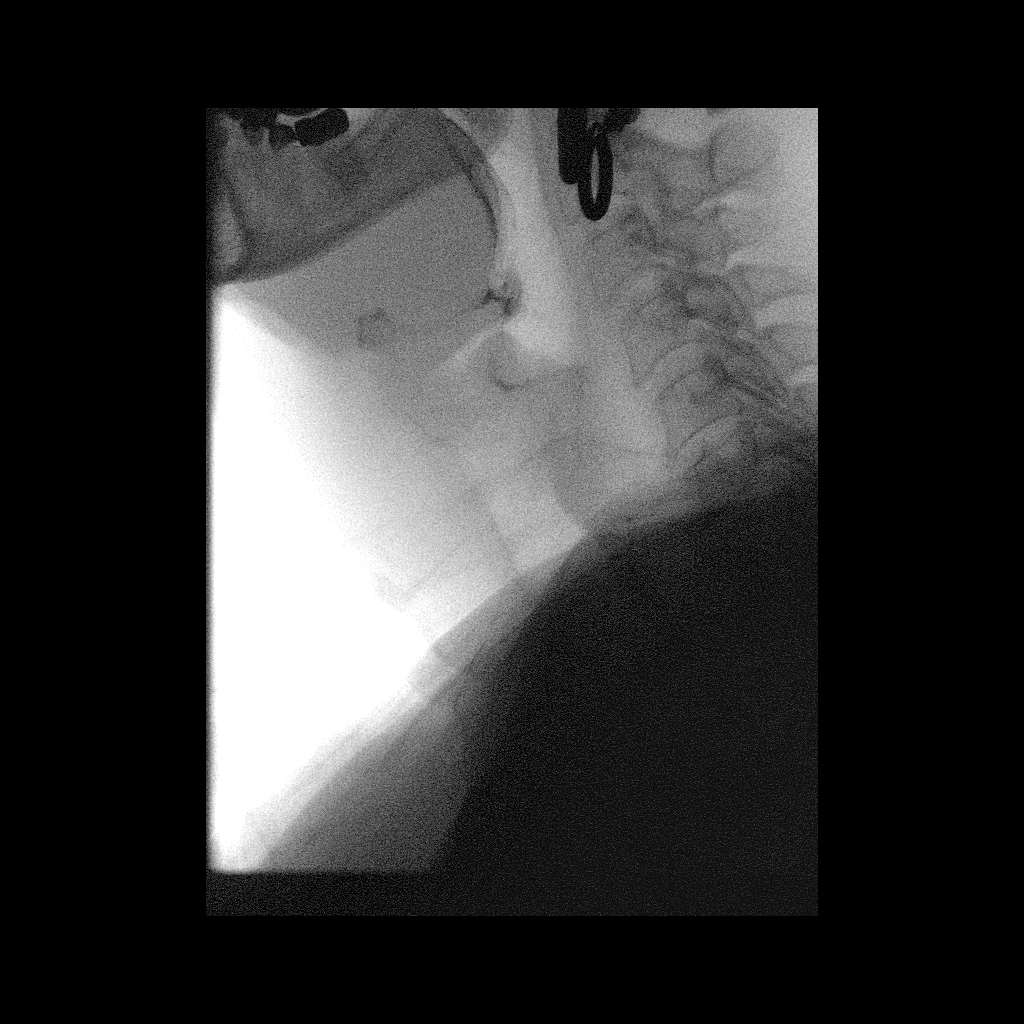

[1 of 1 positions shown; findings below may reference images not displayed]

FINDINGS: Thin liquid: There is transient laryngeal penetration with thin
liquid which clears essentially immediately. No frank aspiration.

Nectar thick liquid- within normal limits

Honey-not studied

Pure- within normal limits

Cracker-not studied

Pure with cracker- within normal limits

Barium tablet-not studied
IMPRESSION: Transient laryngeal penetration with thin liquid only which cleared
immediately. No other abnormality noted.

Please refer to the Speech Pathologists report for complete details
and recommendations.

## 2019-01-10 NOTE — Therapy (Signed)
[place], [place], [address] Phone: [phone]   Fax:     Modified Barium Swallow  Patient Details  Name: Sara Mcdonald MRN: [id number] Date of Birth: 1946-07-28 No data recorded  Encounter Date: 01/10/2019  End of Session - 01/10/19 1331    Visit Number  1    Number of Visits  1    Date for SLP Re-Evaluation  01/10/19    SLP Start Time  25    SLP Stop Time   1331    SLP Time Calculation (min)  51 min    Activity Tolerance  Patient tolerated treatment well       Past Medical History:  Diagnosis Date  . Anxiety   . Depression   . Diabetes mellitus without complication ([place])   . Elevated lipids   . Fibromyalgia   . Headache   . Hypertension   . OA (osteoarthritis)   . Sleep apnea     Past Surgical History:  Procedure Laterality Date  . ABDOMINAL HYSTERECTOMY    . APPENDECTOMY    . CHOLECYSTECTOMY    . COLONOSCOPY    . COLONOSCOPY WITH PROPOFOL N/A 07/07/2016   Procedure: COLONOSCOPY WITH PROPOFOL;  Surgeon: Sara Silvas, MD;  [facility] END[facility]COPY;  Service: Endoscopy;  Laterality: N/A;  . ESOPHAGOGASTRODUODENOSCOPY    . FLEXIBLE SIGMOIDOSCOPY    . nanoflex Bilateral     There were no vitals filed for this visit.   Subjective: Patient behavior: (alertness, ability to follow instructions, etc.): The patient is alert, able to verbalize her medical history, and able to follow directions.  Chief complaint: chronic cough and laryngopharyngeal reflux   Objective:  Radiological Procedure: A videoflouroscopic evaluation of oral-preparatory, reflex initiation, and pharyngeal phases of the swallow was performed; as well as a screening of the upper esophageal phase.  I. POSTURE: Upright in MBS chair  II. VIEW: Lateral  III. COMPENSATORY STRATEGIES: N/A  IV. BOLUSES ADMINISTERED:    Thin Liquid: 1 single sip, 4 rapid consecutive sips   Nectar-thick Liquid: 1  moderate   Honey-thick Liquid: DNT   Puree: 2 teaspoon presentations   Mechanical Soft: 1/4 graham cracker in applesauce  V. RESULTS OF EVALUATION: A. ORAL PREPARATORY PHASE: (The lips, tongue, and velum are observed for strength and coordination)       **Overall Severity Rating: within normal limits   B. SWALLOW INITIATION/REFLEX: (The reflex is normal if "triggered" by the time the bolus reached the base of the tongue)  **Overall Severity Rating: Mild-moderate; triggers while falling from the valleculae to the pyriform sinuses  C. PHARYNGEAL PHASE: (Pharyngeal function is normal if the bolus shows rapid, smooth, and continuous transit through the pharynx and there is no pharyngeal residue after the swallow)  **Overall Severity Rating: Mild; decreased tongue base retraction, trace-to-mild vallecular residue  D. LARYNGEAL PENETRATION: (Material entering into the laryngeal inlet/vestibule but not aspirated) TRANSIENT laryngeal penetration with thin liquids- delayed throat clearing after study  E. ASPIRATION: NONE  F. ESOPHAGEAL PHASE: (Screening of the upper esophagus): In the cervical esophagus there is a finger-like protrusion along the posterior wall during swallow (does not impede flow of boluses) consistent with prominent cricopharyngeus.    ASSESSMENT: This 73 year old woman; with chronic cough and laryngopharyngeal reflux; is presenting with mild oropharyngeal dysphagia characterized by delayed pharyngeal swallow initiation, mild reduced tongue base retraction (with min-to-mild vallecular residue), and TRANSIENT laryngeal penetration with thin liquids.  Oral control of the  bolus including oral hold, rotary mastication, and anterior to posterior transfer is within functional limits (slowed oral management, but no other concerns).   There is no observed tracheal aspiration and the patient is not at significant risk for prandial aspiration.  Delayed swallow initiation and reduced  pharyngeal pressure generation can be consistent with effects of laryngopharyngeal reflux (inflammation, edema, and resultant decreased sensation of the larynx and pharynx).  In the cervical esophagus there is a finger-like protrusion along the posterior wall during swallow (does not impede flow of boluses) consistent with prominent cricopharyngeus.  The patient was counseled to practice reflux precautions including elevating the head of the bed and avoiding PO intake several hours prior to going to bed.  PLAN/RECOMMENDATIONS:   A. Diet: Regular- soften and moisten as needed for comfort   B. Swallowing Precautions: Reflux precautions at night   C. Recommended consultation to: GI and ENT   D. Therapy recommendations: speech therapy is not indicated   E. Results and recommendations were discussed with the patient immediately following the study and the final report routed to the referring MD.   Oropharyngeal dysphagia - Plan: DG OP Swallowing Func-Medicare/Speech Path, DG OP Swallowing Func-Medicare/Speech Path        Problem List Patient Active Problem List   Diagnosis Date Noted  . Sepsis ([place]) 03/17/2017  . Community acquired pneumonia 03/17/2017  . COPD with acute exacerbation ([place]) 03/17/2017  . Hypoxia 03/17/2017  . Hyponatremia 03/17/2017  . Acute on chronic renal insufficiency 03/17/2017  . Pneumonia 03/17/2017  . Acute respiratory failure with hypoxia ([place]) 02/01/2017   Sara Sea, MS/CCC- SLP  Sara Mcdonald 01/10/2019, 1:32 PM  [facility] [place], [place], [address] Phone: [phone]   Fax:     Name: Sara Mcdonald MRN: [id number] Date of Birth: Jan 18, 1946

## 2019-01-16 ENCOUNTER — Other Ambulatory Visit
Admission: RE | Admit: 2019-01-16 | Discharge: 2019-01-16 | Disposition: A | Discharge status: Home / Self Care | Payer: PPO | Source: Ambulatory Visit | Attending: Pulmonary Disease | Admitting: Pulmonary Disease

## 2019-01-16 ENCOUNTER — Ambulatory Visit: Payer: PPO | Attending: Neurology

## 2019-01-16 DIAGNOSIS — R0781 Pleurodynia: Secondary | ICD-10-CM | POA: Insufficient documentation

## 2019-01-16 DIAGNOSIS — R05 Cough: Secondary | ICD-10-CM | POA: Diagnosis not present

## 2019-01-16 DIAGNOSIS — J069 Acute upper respiratory infection, unspecified: Secondary | ICD-10-CM | POA: Diagnosis not present

## 2019-01-16 DIAGNOSIS — R079 Chest pain, unspecified: Secondary | ICD-10-CM | POA: Diagnosis not present

## 2019-01-16 LAB — FIBRIN DERIVATIVES D-DIMER (ARMC ONLY): Fibrin derivatives D-dimer (ARMC): 708.42 ng/mL (FEU) — ABNORMAL HIGH (ref 0.00–499.00)

## 2019-01-17 ENCOUNTER — Other Ambulatory Visit: Payer: Self-pay | Admitting: Pulmonary Disease

## 2019-01-17 ENCOUNTER — Ambulatory Visit
Admission: RE | Admit: 2019-01-17 | Discharge: 2019-01-17 | Disposition: A | Discharge status: Home / Self Care | Payer: PPO | Source: Ambulatory Visit | Attending: Pulmonary Disease | Admitting: Pulmonary Disease

## 2019-01-17 DIAGNOSIS — R071 Chest pain on breathing: Secondary | ICD-10-CM

## 2019-01-17 DIAGNOSIS — R0602 Shortness of breath: Secondary | ICD-10-CM | POA: Diagnosis not present

## 2019-01-17 DIAGNOSIS — R079 Chest pain, unspecified: Secondary | ICD-10-CM | POA: Diagnosis not present

## 2019-01-17 IMAGING — US US EXTREM LOW VENOUS
1 series · 13 of 24 positions shown · non-contrast
Comparison: None.

CLINICAL DATA: Shortness of breath and chest pain.



[Series 1: us extrem low venous · 13 of 58 slices shown]
[im 1/58]
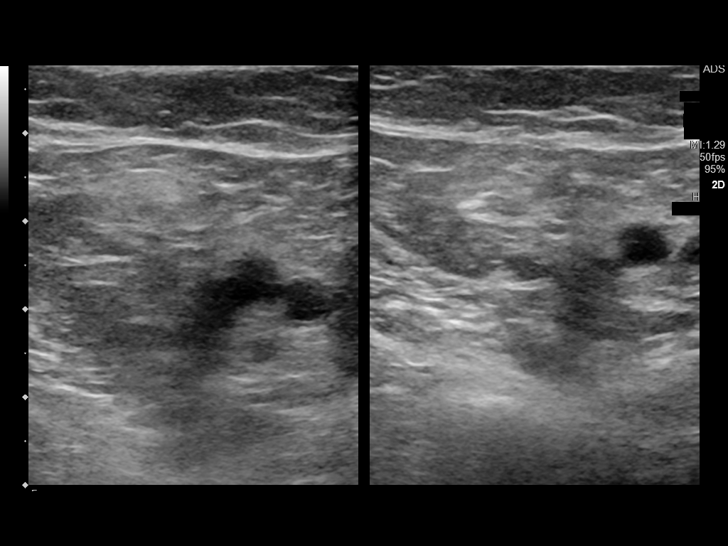
[im 5/58]
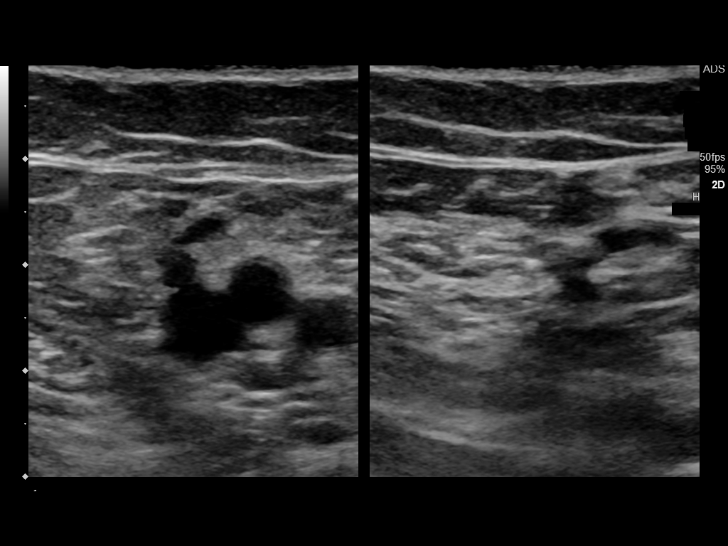
[im 10/58]
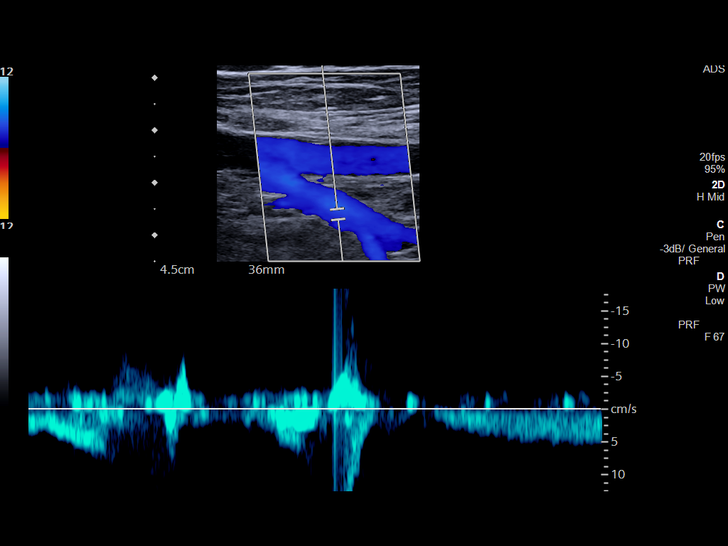
[im 15/58]
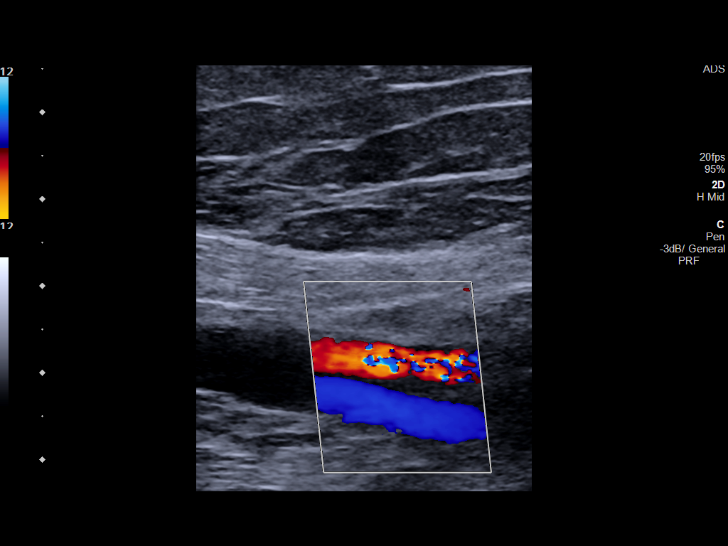
[im 20/58]
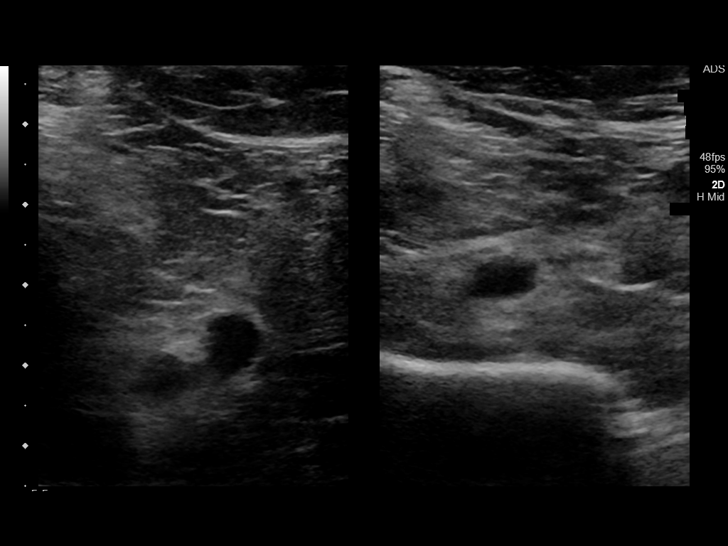
[im 25/58]
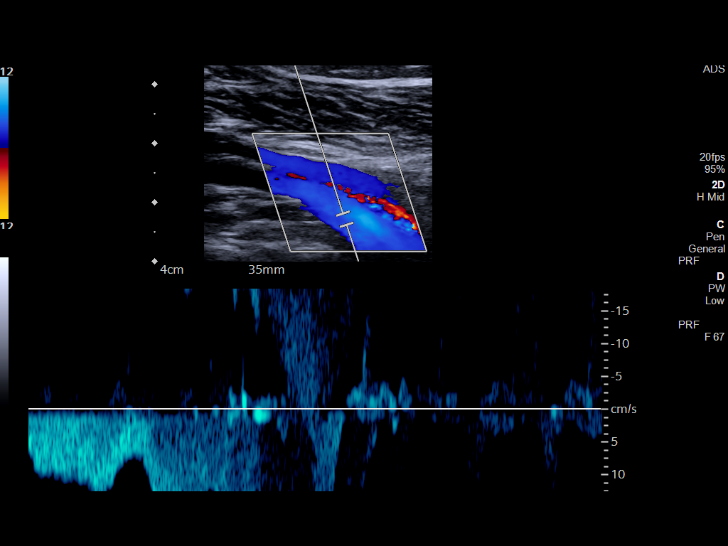
[im 30/58]
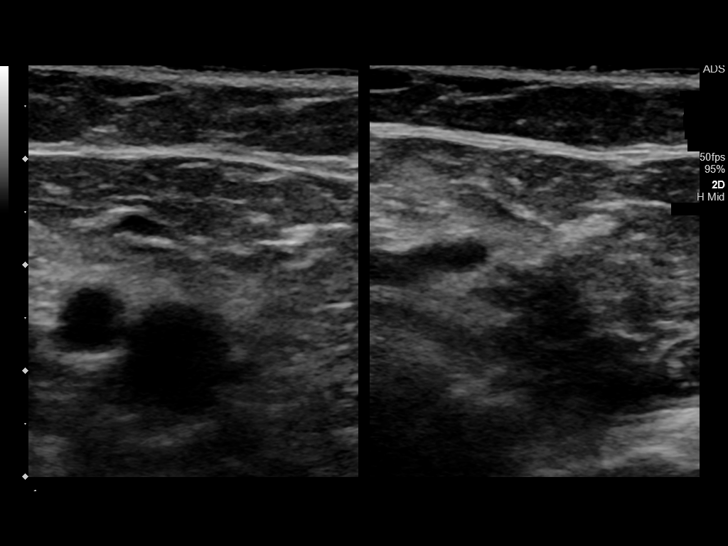
[im 33/58]
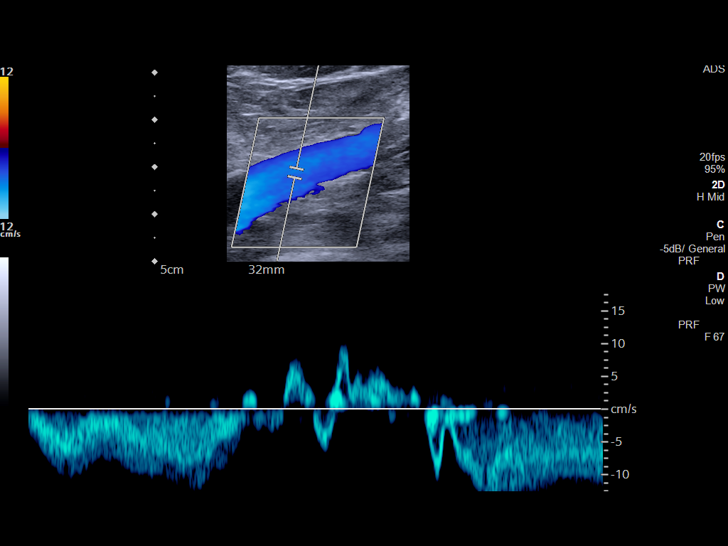
[im 38/58]
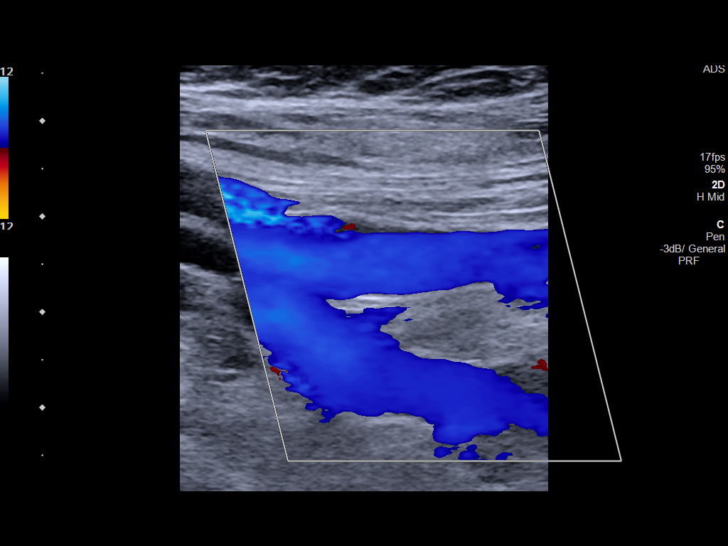
[im 43/58]
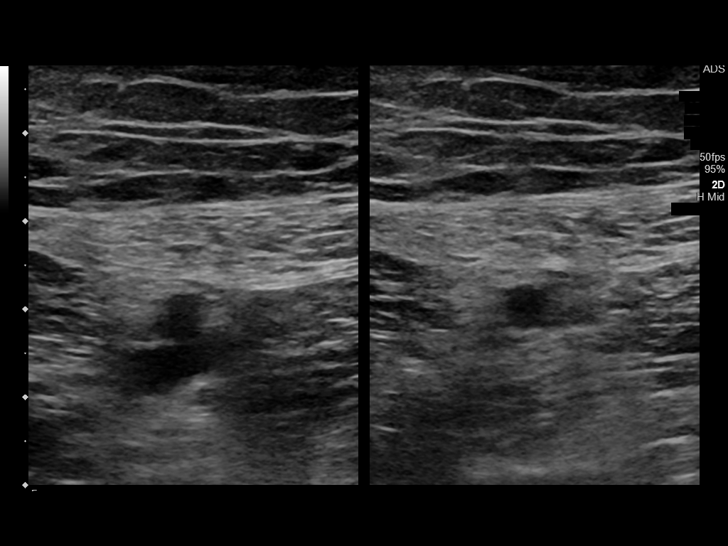
[im 48/58]
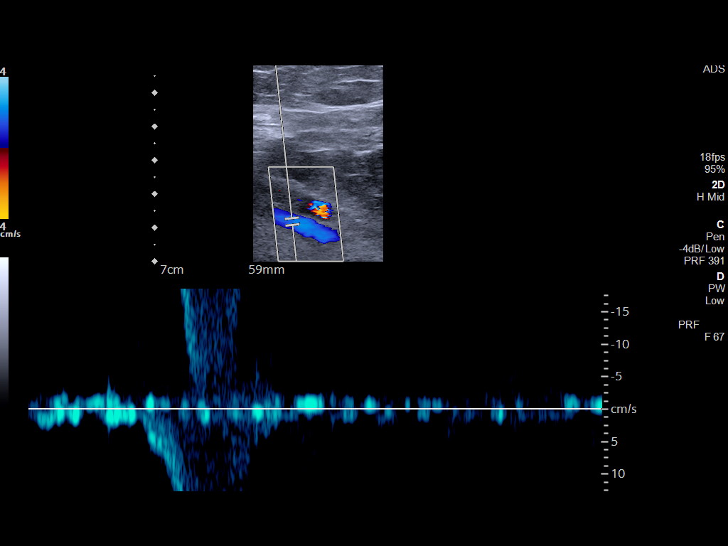
[im 53/58]
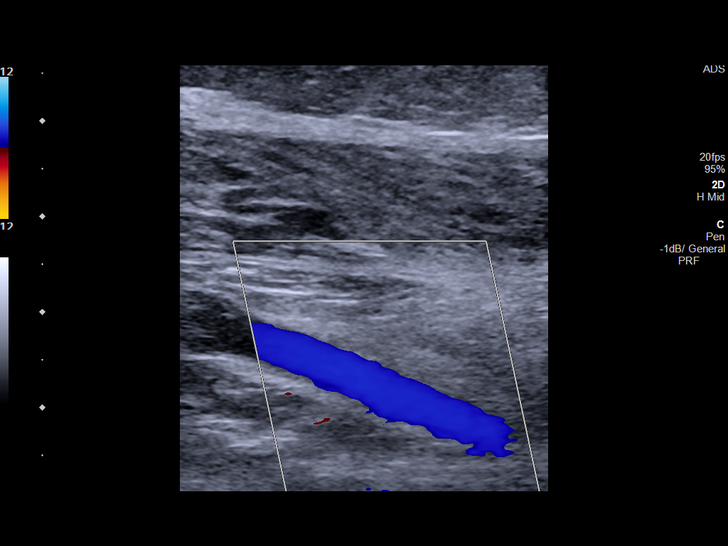
[im 58/58]
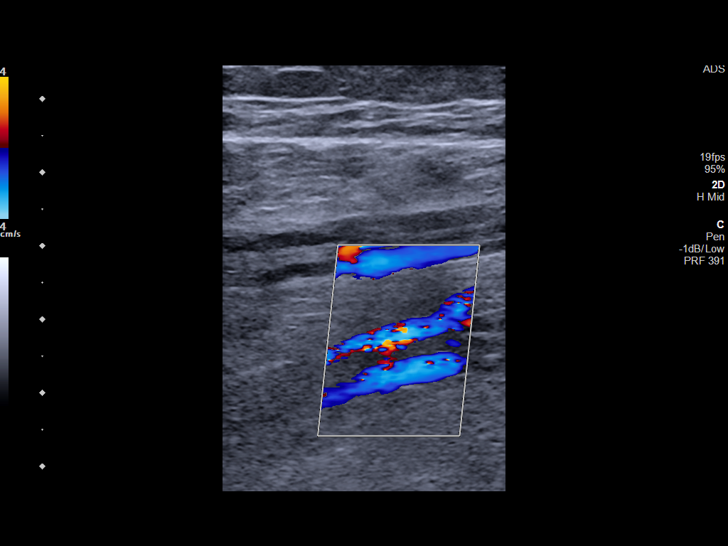

[13 of 24 positions shown; findings below may reference images not displayed]

FINDINGS: RIGHT LOWER EXTREMITY

Common Femoral Vein: No evidence of thrombus. Normal
compressibility, respiratory phasicity and response to augmentation.

Saphenofemoral Junction: No evidence of thrombus. Normal
compressibility and flow on color Doppler imaging.

Profunda Femoral Vein: No evidence of thrombus. Normal
compressibility and flow on color Doppler imaging.

Femoral Vein: No evidence of thrombus. Normal compressibility,
respiratory phasicity and response to augmentation.

Popliteal Vein: No evidence of thrombus. Normal compressibility,
respiratory phasicity and response to augmentation.

Calf Veins: No evidence of thrombus. Normal compressibility and flow
on color Doppler imaging.

Superficial Great Saphenous Vein: No evidence of thrombus. Normal
compressibility.

Venous Reflux:  None.

Other Findings: No evidence of superficial thrombophlebitis or
abnormal fluid collection.

LEFT LOWER EXTREMITY

Common Femoral Vein: No evidence of thrombus. Normal
compressibility, respiratory phasicity and response to augmentation.

Saphenofemoral Junction: No evidence of thrombus. Normal
compressibility and flow on color Doppler imaging.

Profunda Femoral Vein: No evidence of thrombus. Normal
compressibility and flow on color Doppler imaging.

Femoral Vein: No evidence of thrombus. Normal compressibility,
respiratory phasicity and response to augmentation.

Popliteal Vein: No evidence of thrombus. Normal compressibility,
respiratory phasicity and response to augmentation.

Calf Veins: No evidence of thrombus. Normal compressibility and flow
on color Doppler imaging.

Superficial Great Saphenous Vein: No evidence of thrombus. Normal
compressibility.

Venous Reflux:  None.

Other Findings: No evidence of superficial thrombophlebitis or
abnormal fluid collection.
IMPRESSION: No evidence of bilateral lower extremity deep venous thrombosis.

## 2019-01-18 ENCOUNTER — Ambulatory Visit
Admission: RE | Admit: 2019-01-18 | Discharge: 2019-01-18 | Disposition: A | Discharge status: Home / Self Care | Payer: PPO | Source: Ambulatory Visit | Attending: Internal Medicine | Admitting: Internal Medicine

## 2019-01-18 DIAGNOSIS — Z1231 Encounter for screening mammogram for malignant neoplasm of breast: Secondary | ICD-10-CM | POA: Diagnosis not present

## 2019-01-18 IMAGING — MG DIGITAL SCREENING BILATERAL MAMMOGRAM WITH TOMO AND CAD
8 series · 8 of 24 positions shown · non-contrast
Comparison: Previous exam(s).

CLINICAL DATA: Screening.

EXAM:
DIGITAL SCREENING BILATERAL MAMMOGRAM WITH TOMO AND CAD

[L CC synth-2D]
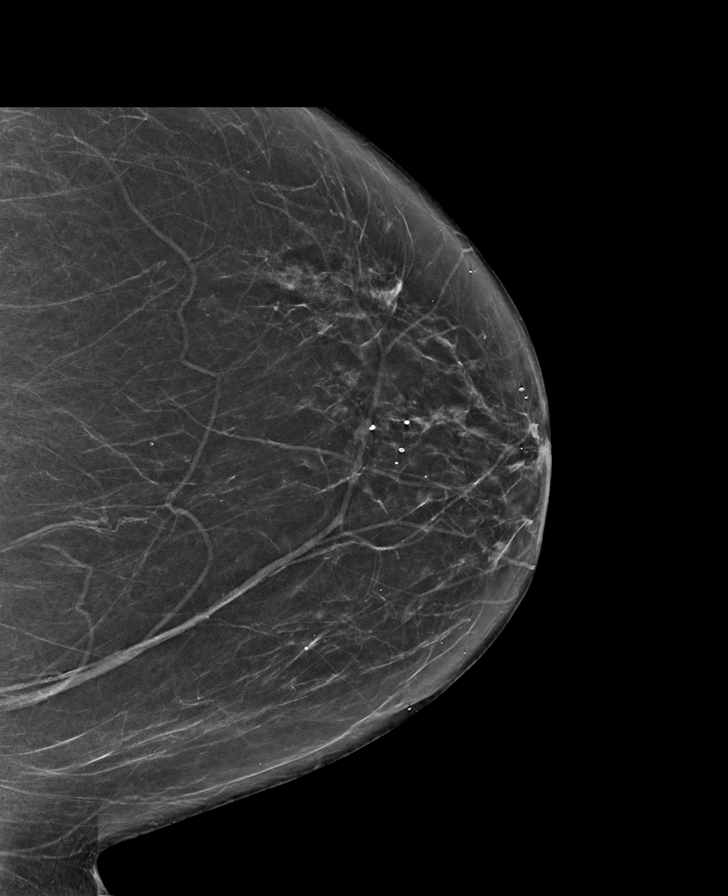

[R MLO synth-2D]
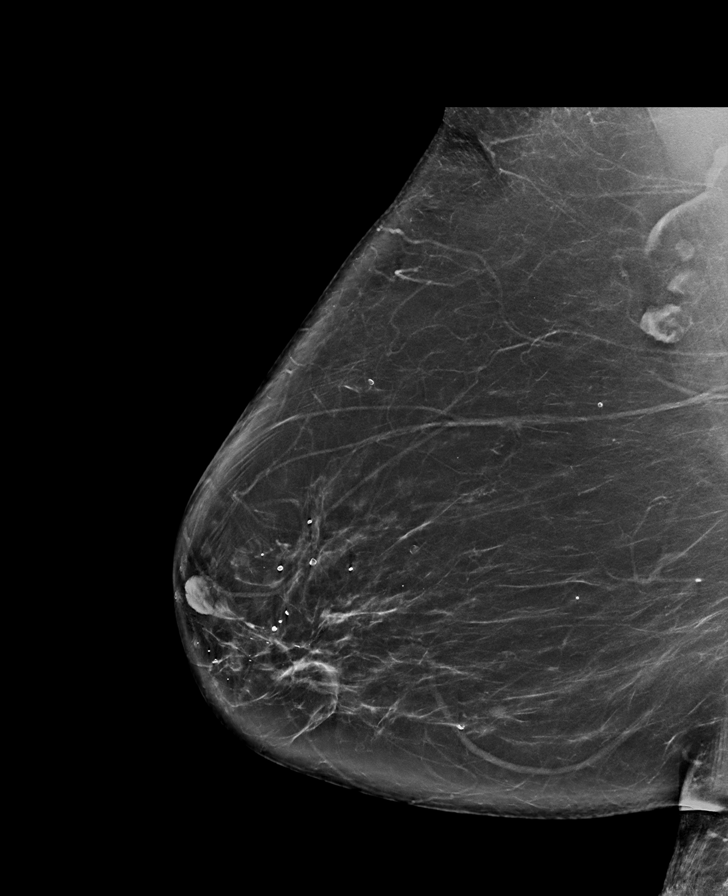

[L MLO synth-2D]
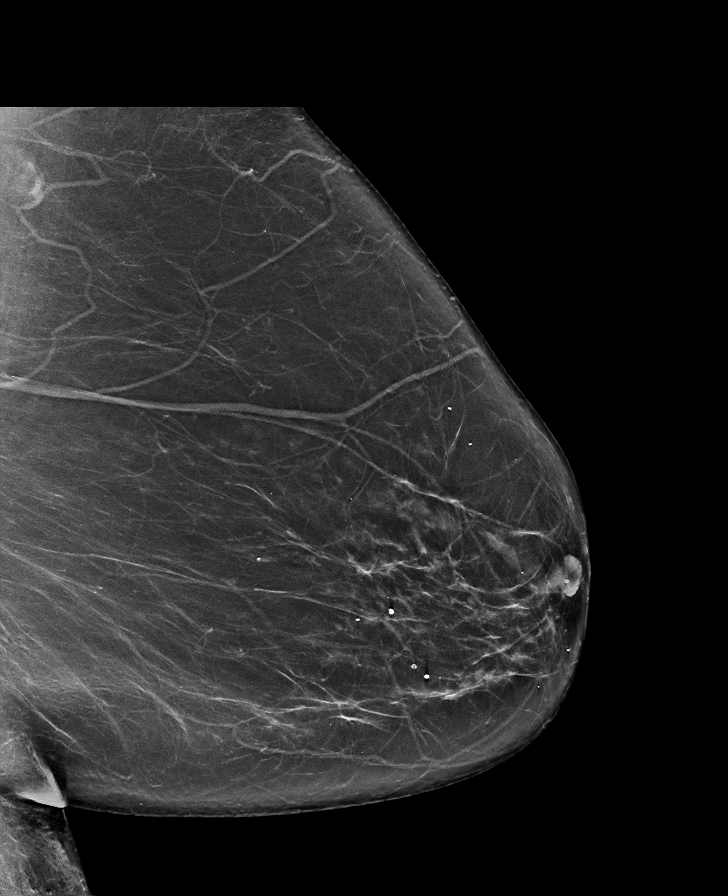

[R CC synth-2D]
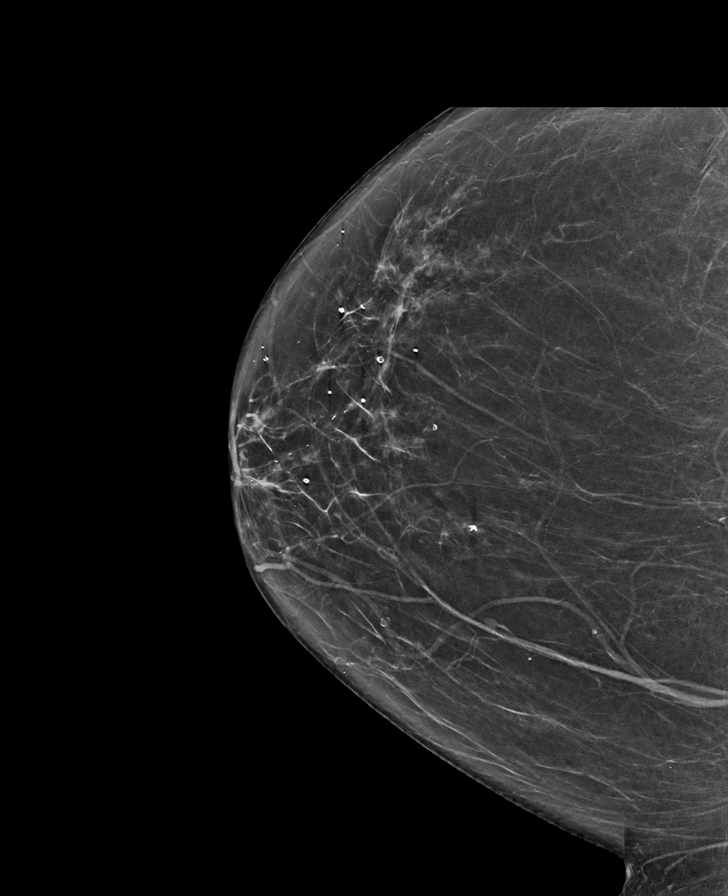

[R CC tomo · tomo slice 35/70.0]
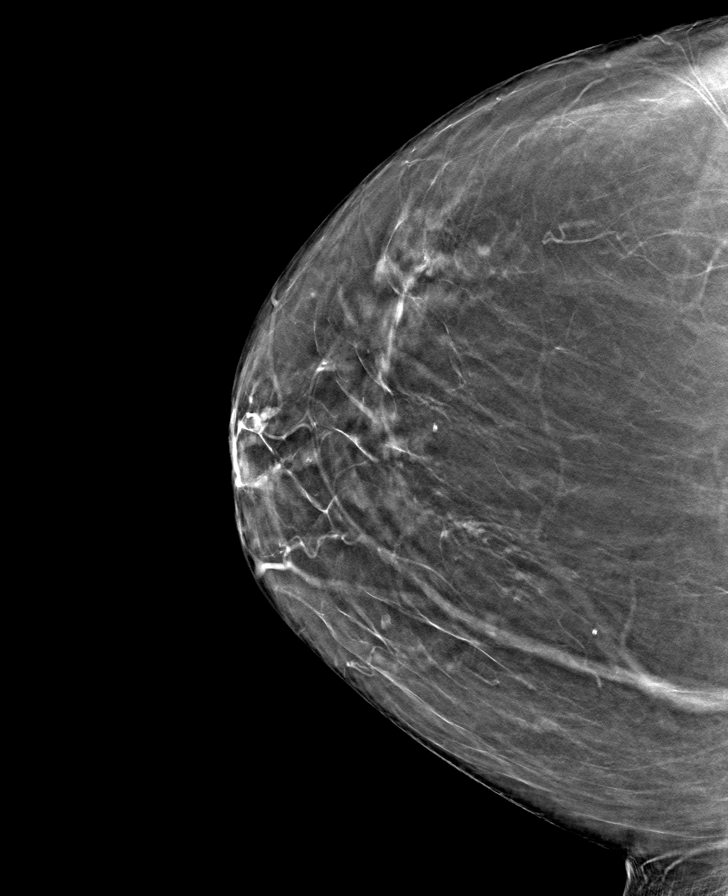

[L MLO tomo · tomo slice 39/78.0]
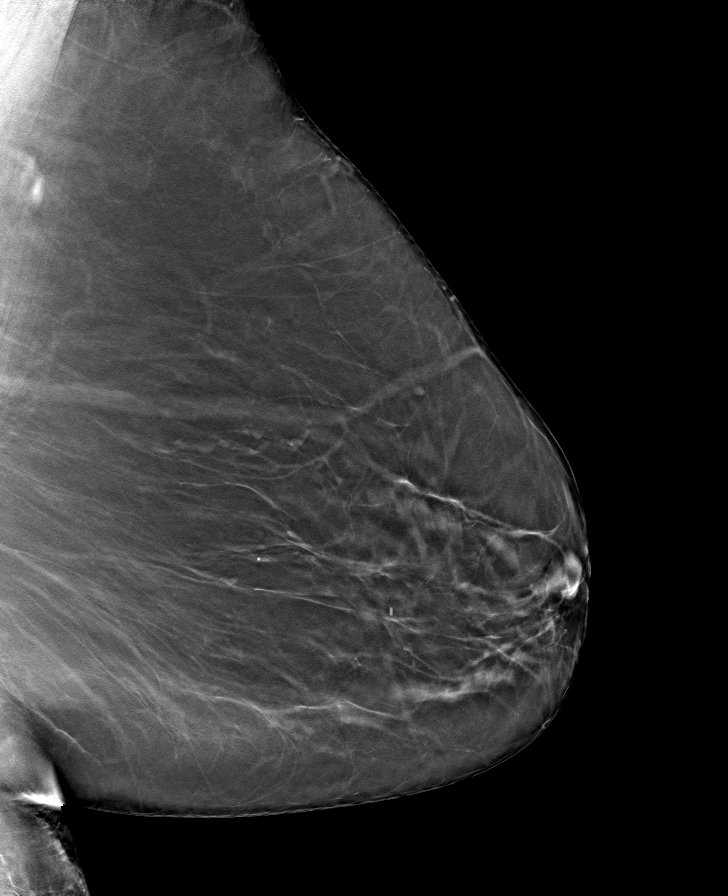

[L CC tomo · tomo slice 34/67.0]
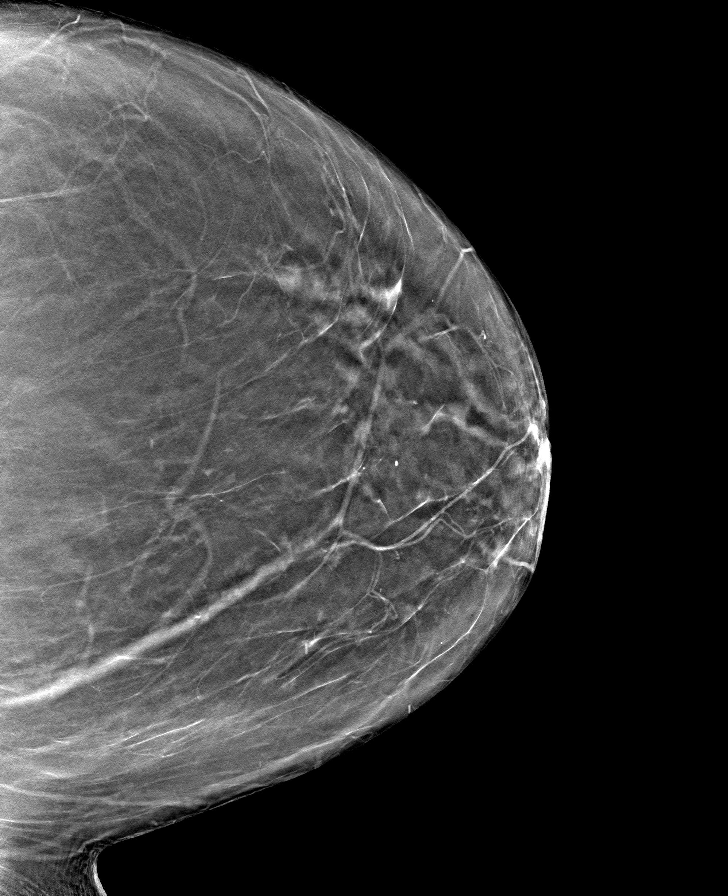

[R MLO tomo · tomo slice 43/86.0]
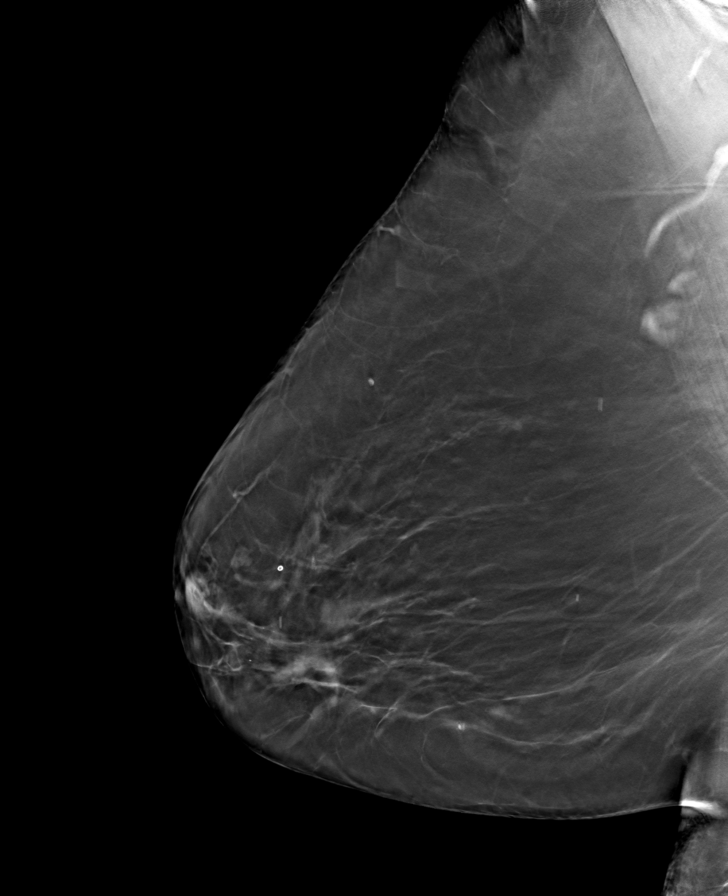

[8 of 24 positions shown; findings below may reference images not displayed]

ACR Breast Density Category b: There are scattered areas of
fibroglandular density.
FINDINGS: There are no findings suspicious for malignancy. Images were
processed with CAD.
IMPRESSION: No mammographic evidence of malignancy. A result letter of this
screening mammogram will be mailed directly to the patient.

RECOMMENDATION:
Screening mammogram in one year. (Code:[TQ])

BI-RADS CATEGORY  1: Negative.

## 2019-02-28 ENCOUNTER — Ambulatory Visit: Payer: PPO | Attending: Neurology

## 2019-02-28 DIAGNOSIS — G4733 Obstructive sleep apnea (adult) (pediatric): Secondary | ICD-10-CM | POA: Insufficient documentation

## 2019-02-28 DIAGNOSIS — G4730 Sleep apnea, unspecified: Secondary | ICD-10-CM | POA: Diagnosis not present

## 2019-05-02 DIAGNOSIS — E782 Mixed hyperlipidemia: Secondary | ICD-10-CM | POA: Diagnosis not present

## 2019-05-02 DIAGNOSIS — E1169 Type 2 diabetes mellitus with other specified complication: Secondary | ICD-10-CM | POA: Diagnosis not present

## 2019-05-08 DIAGNOSIS — E1169 Type 2 diabetes mellitus with other specified complication: Secondary | ICD-10-CM | POA: Diagnosis not present

## 2019-05-08 DIAGNOSIS — N183 Chronic kidney disease, stage 3 (moderate): Secondary | ICD-10-CM | POA: Diagnosis not present

## 2019-05-08 DIAGNOSIS — Z0000 Encounter for general adult medical examination without abnormal findings: Secondary | ICD-10-CM | POA: Diagnosis not present

## 2019-05-08 DIAGNOSIS — M5010 Cervical disc disorder with radiculopathy, unspecified cervical region: Secondary | ICD-10-CM | POA: Diagnosis not present

## 2019-05-08 DIAGNOSIS — E782 Mixed hyperlipidemia: Secondary | ICD-10-CM | POA: Diagnosis not present

## 2019-07-31 DIAGNOSIS — R05 Cough: Secondary | ICD-10-CM | POA: Diagnosis not present

## 2019-11-04 DIAGNOSIS — E1169 Type 2 diabetes mellitus with other specified complication: Secondary | ICD-10-CM | POA: Diagnosis not present

## 2019-11-04 DIAGNOSIS — E782 Mixed hyperlipidemia: Secondary | ICD-10-CM | POA: Diagnosis not present

## 2019-11-08 DIAGNOSIS — M5116 Intervertebral disc disorders with radiculopathy, lumbar region: Secondary | ICD-10-CM | POA: Diagnosis not present

## 2019-11-08 DIAGNOSIS — Z23 Encounter for immunization: Secondary | ICD-10-CM | POA: Diagnosis not present

## 2019-11-08 DIAGNOSIS — E782 Mixed hyperlipidemia: Secondary | ICD-10-CM | POA: Diagnosis not present

## 2019-11-08 DIAGNOSIS — E1165 Type 2 diabetes mellitus with hyperglycemia: Secondary | ICD-10-CM | POA: Diagnosis not present

## 2019-11-08 DIAGNOSIS — E1169 Type 2 diabetes mellitus with other specified complication: Secondary | ICD-10-CM | POA: Diagnosis not present

## 2019-11-08 DIAGNOSIS — N1832 Chronic kidney disease, stage 3b: Secondary | ICD-10-CM | POA: Diagnosis not present

## 2019-12-17 ENCOUNTER — Emergency Department
Admission: EM | Admit: 2019-12-17 | Discharge: 2019-12-17 | Disposition: A | Discharge status: Home / Self Care | Payer: PPO | Attending: Emergency Medicine | Admitting: Emergency Medicine

## 2019-12-17 ENCOUNTER — Emergency Department: Payer: PPO

## 2019-12-17 ENCOUNTER — Other Ambulatory Visit: Payer: Self-pay

## 2019-12-17 DIAGNOSIS — E119 Type 2 diabetes mellitus without complications: Secondary | ICD-10-CM | POA: Insufficient documentation

## 2019-12-17 DIAGNOSIS — Y999 Unspecified external cause status: Secondary | ICD-10-CM | POA: Diagnosis not present

## 2019-12-17 DIAGNOSIS — R52 Pain, unspecified: Secondary | ICD-10-CM | POA: Diagnosis not present

## 2019-12-17 DIAGNOSIS — M7989 Other specified soft tissue disorders: Secondary | ICD-10-CM | POA: Diagnosis not present

## 2019-12-17 DIAGNOSIS — S82831A Other fracture of upper and lower end of right fibula, initial encounter for closed fracture: Secondary | ICD-10-CM | POA: Diagnosis not present

## 2019-12-17 DIAGNOSIS — J449 Chronic obstructive pulmonary disease, unspecified: Secondary | ICD-10-CM | POA: Insufficient documentation

## 2019-12-17 DIAGNOSIS — Y929 Unspecified place or not applicable: Secondary | ICD-10-CM | POA: Diagnosis not present

## 2019-12-17 DIAGNOSIS — S82839A Other fracture of upper and lower end of unspecified fibula, initial encounter for closed fracture: Secondary | ICD-10-CM

## 2019-12-17 DIAGNOSIS — S99912A Unspecified injury of left ankle, initial encounter: Secondary | ICD-10-CM | POA: Diagnosis not present

## 2019-12-17 DIAGNOSIS — M25571 Pain in right ankle and joints of right foot: Secondary | ICD-10-CM | POA: Diagnosis not present

## 2019-12-17 DIAGNOSIS — S99911A Unspecified injury of right ankle, initial encounter: Secondary | ICD-10-CM | POA: Diagnosis not present

## 2019-12-17 DIAGNOSIS — I10 Essential (primary) hypertension: Secondary | ICD-10-CM | POA: Diagnosis not present

## 2019-12-17 DIAGNOSIS — S93402A Sprain of unspecified ligament of left ankle, initial encounter: Secondary | ICD-10-CM | POA: Diagnosis not present

## 2019-12-17 DIAGNOSIS — X500XXA Overexertion from strenuous movement or load, initial encounter: Secondary | ICD-10-CM | POA: Diagnosis not present

## 2019-12-17 DIAGNOSIS — R609 Edema, unspecified: Secondary | ICD-10-CM | POA: Diagnosis not present

## 2019-12-17 DIAGNOSIS — Y9389 Activity, other specified: Secondary | ICD-10-CM | POA: Insufficient documentation

## 2019-12-17 DIAGNOSIS — W19XXXA Unspecified fall, initial encounter: Secondary | ICD-10-CM | POA: Diagnosis not present

## 2019-12-17 DIAGNOSIS — M25572 Pain in left ankle and joints of left foot: Secondary | ICD-10-CM | POA: Diagnosis not present

## 2019-12-17 IMAGING — DX DG ANKLE COMPLETE 3+V*R*
3 series · 3 of 3 positions shown · non-contrast
Comparison: None.

CLINICAL DATA: Fall, ankle pain

EXAM:
RIGHT ANKLE - COMPLETE 3+ VIEW

[ankle ap]
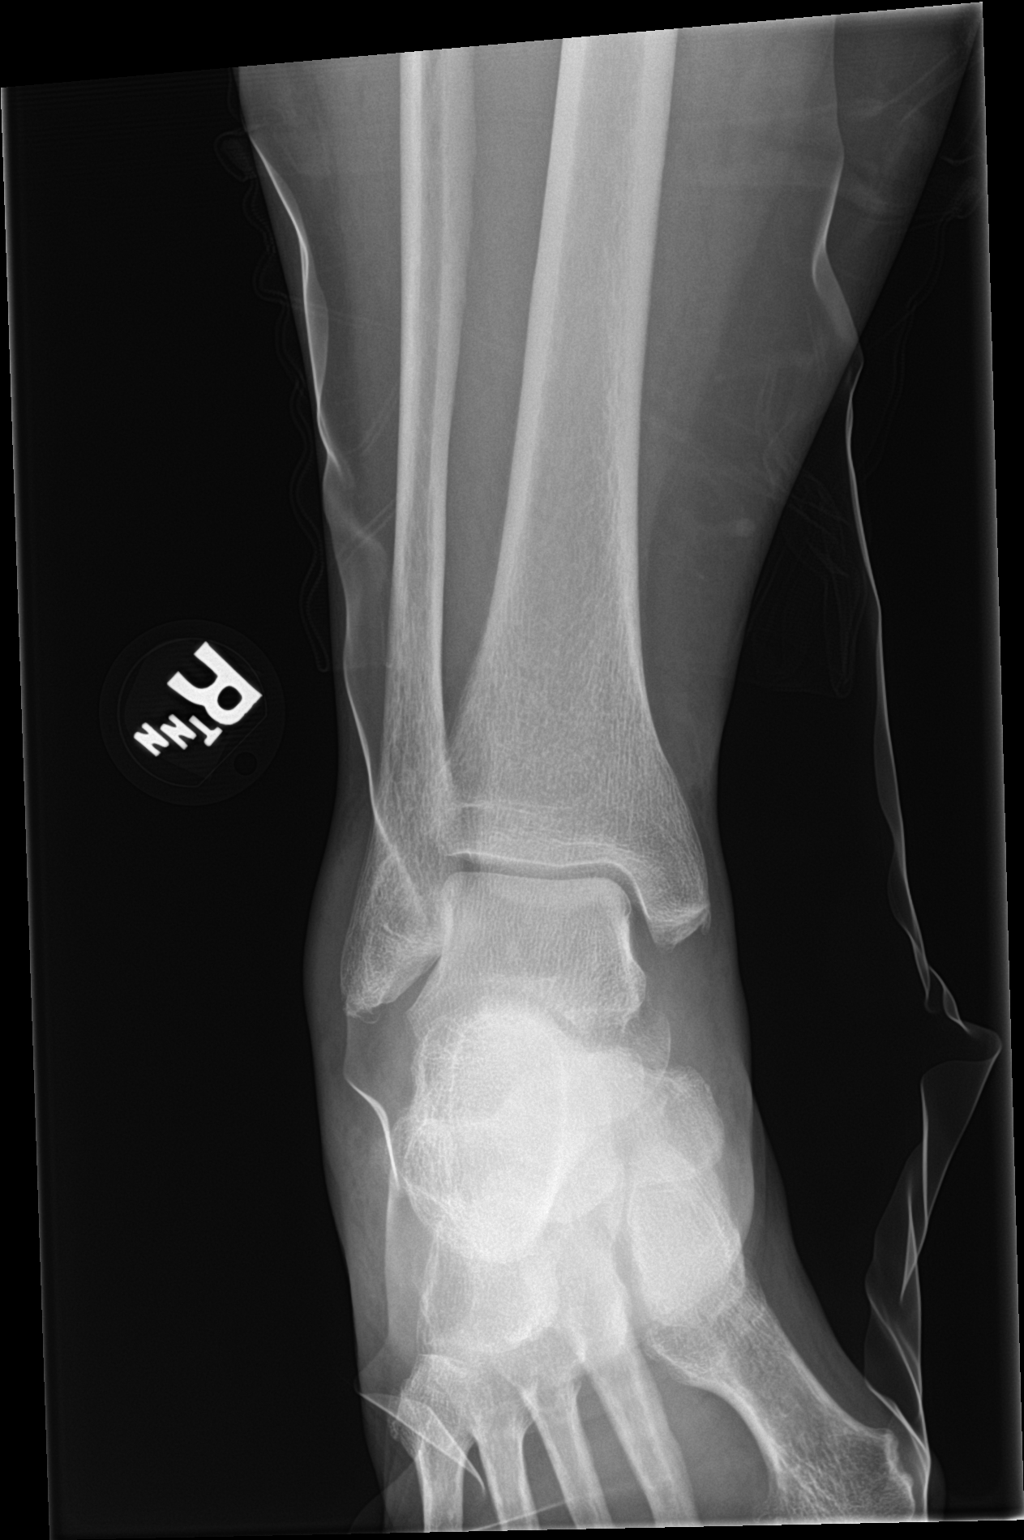

[ankle obl]
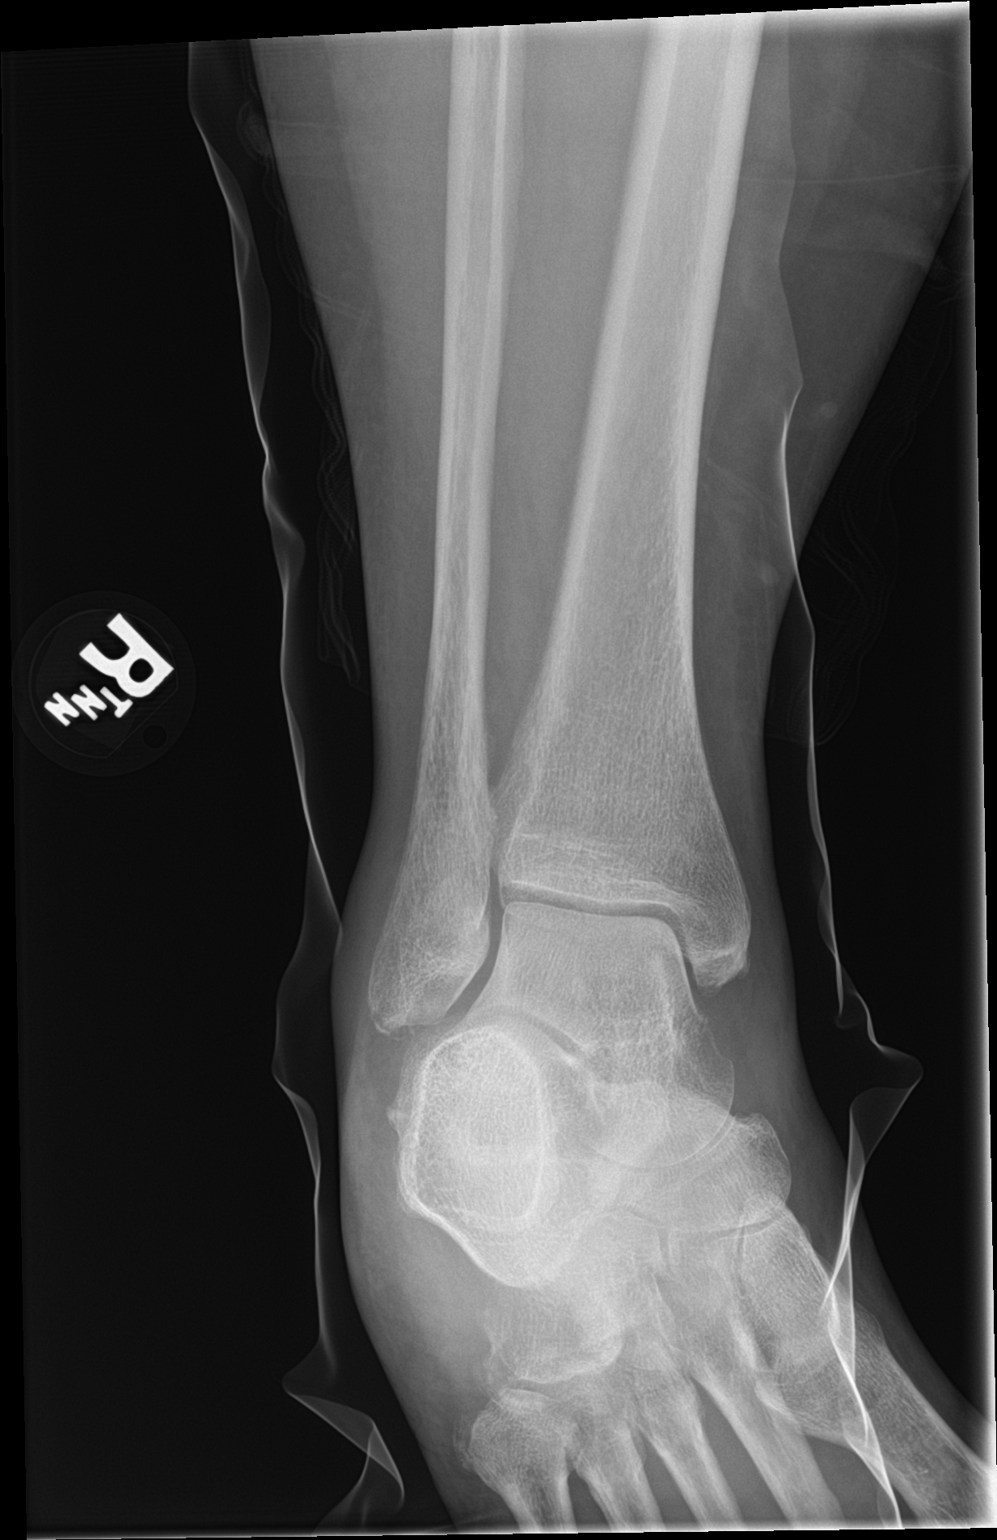

[ankle lat]
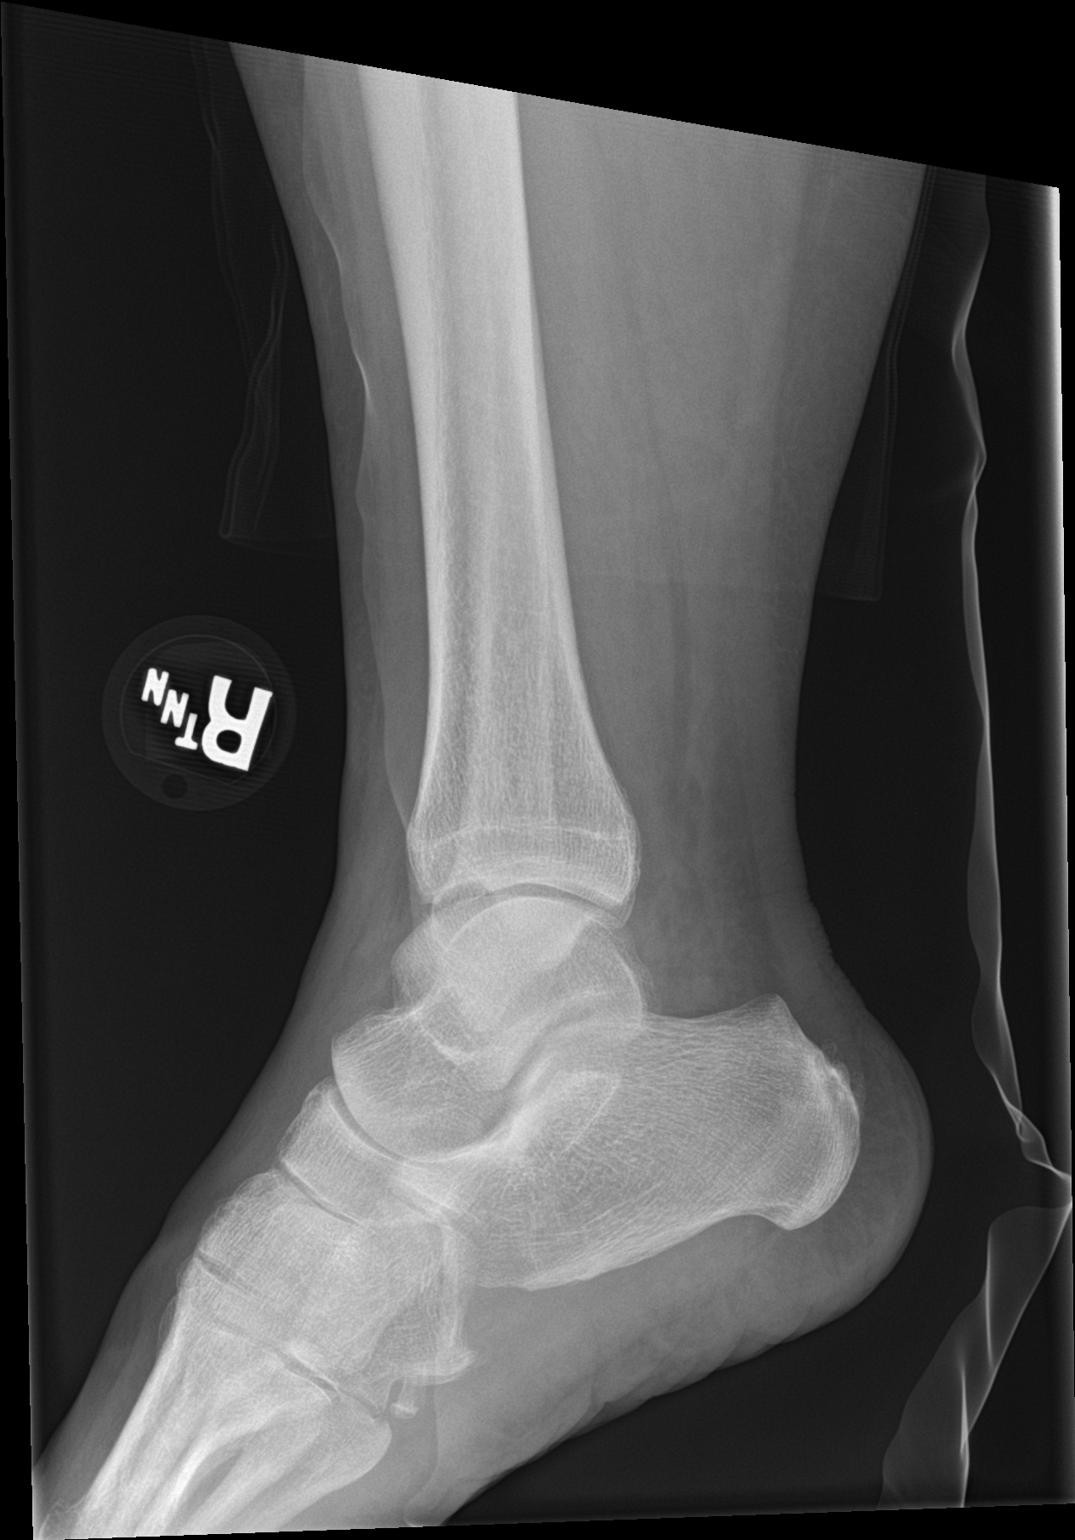

[3 of 3 positions shown; findings below may reference images not displayed]

FINDINGS: Alignment is anatomic. Possible nondisplaced cortical avulsion
fracture of the distal fibula. Joint spaces are preserved.
IMPRESSION: Possible nondisplaced cortical avulsion fracture of the distal right
fibula.

## 2019-12-17 IMAGING — DX DG ANKLE COMPLETE 3+V*L*
3 series · 3 of 3 positions shown · non-contrast
Comparison: None.

CLINICAL DATA: Fall, bilateral ankle pain

EXAM:
LEFT ANKLE COMPLETE - 3+ VIEW

[ankle ap]
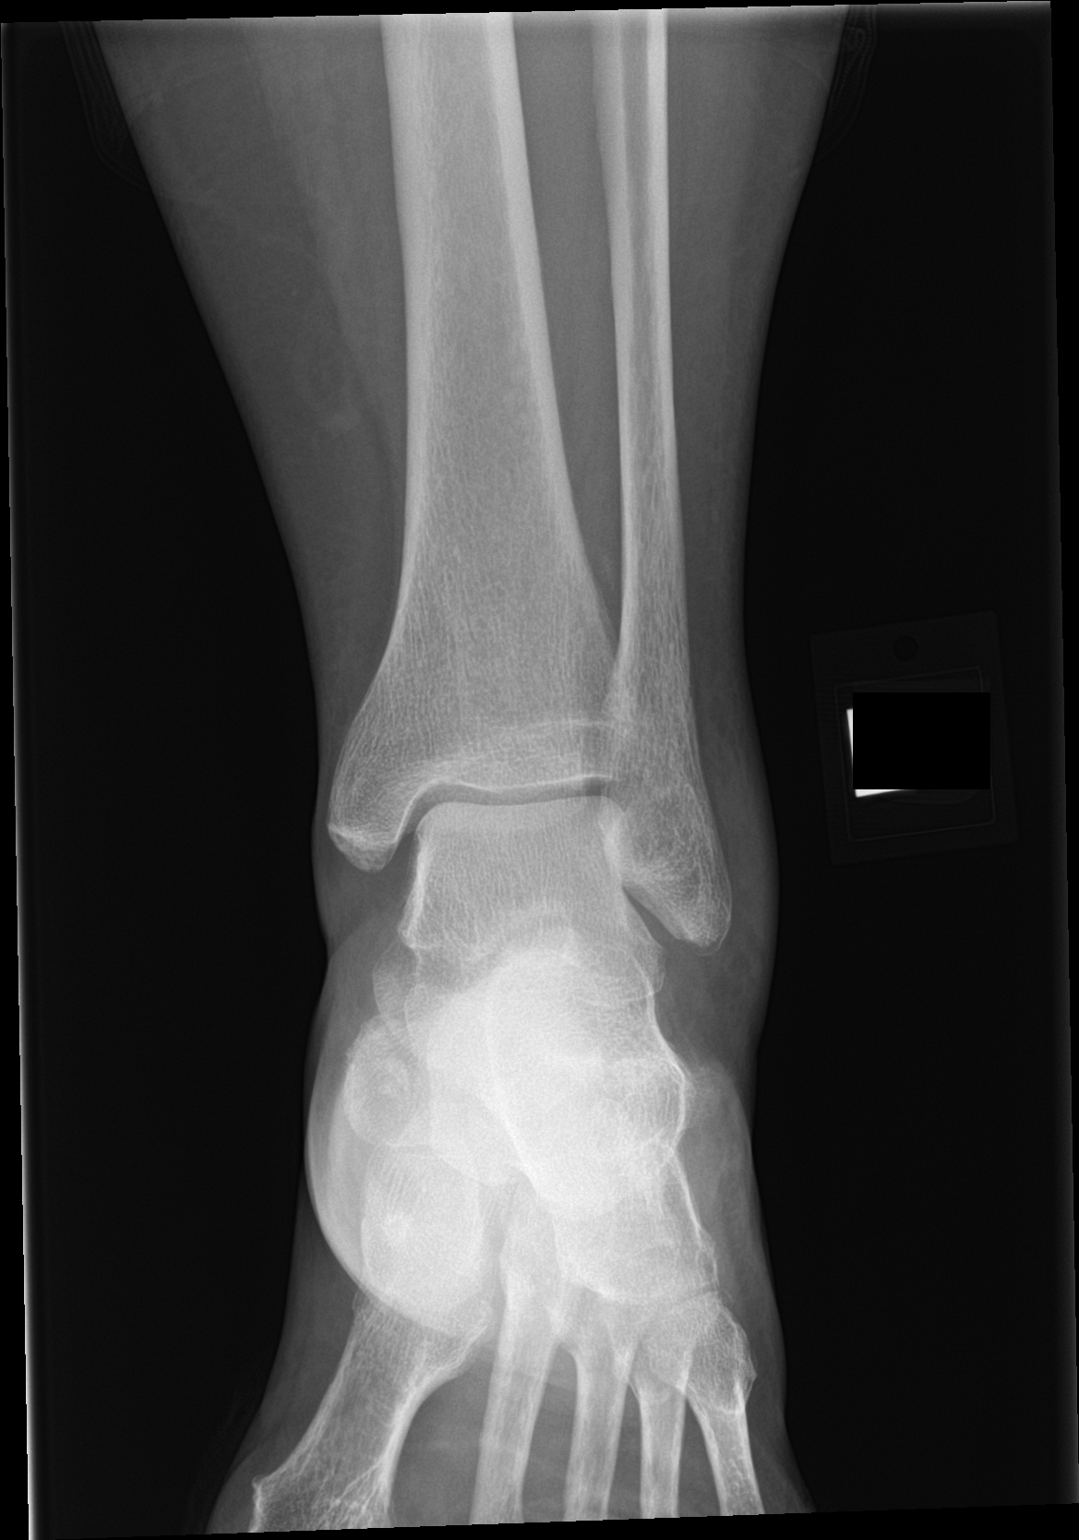

[ankle obl]
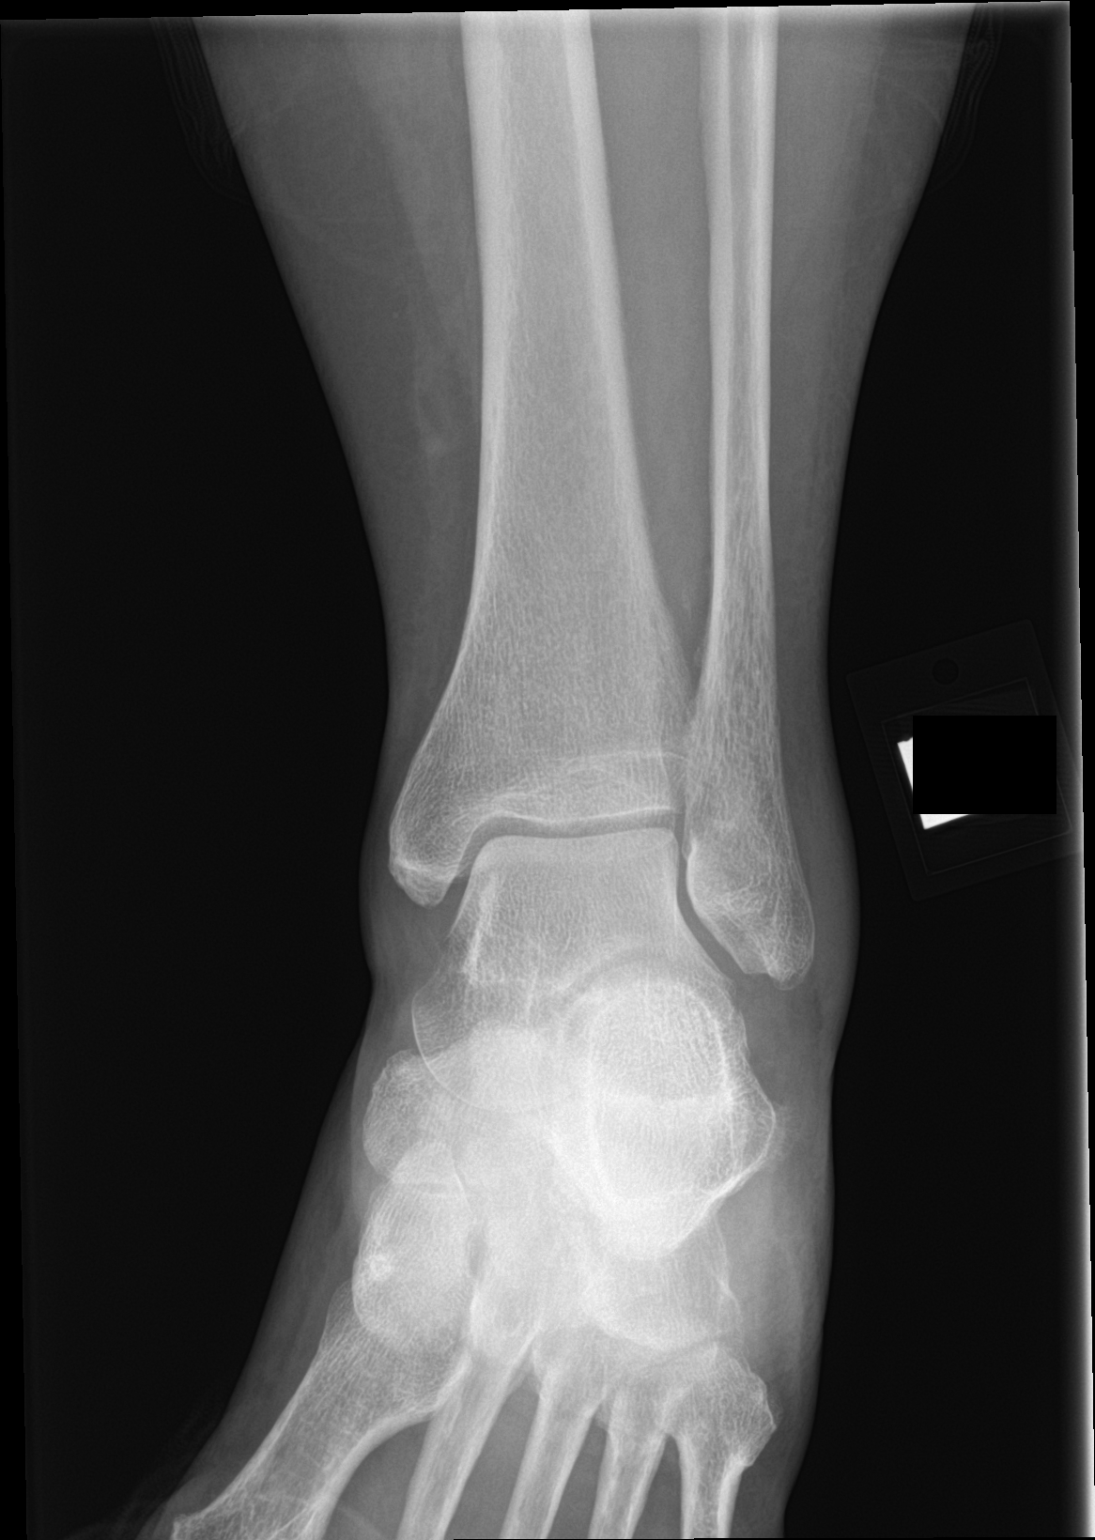

[ankle lat]
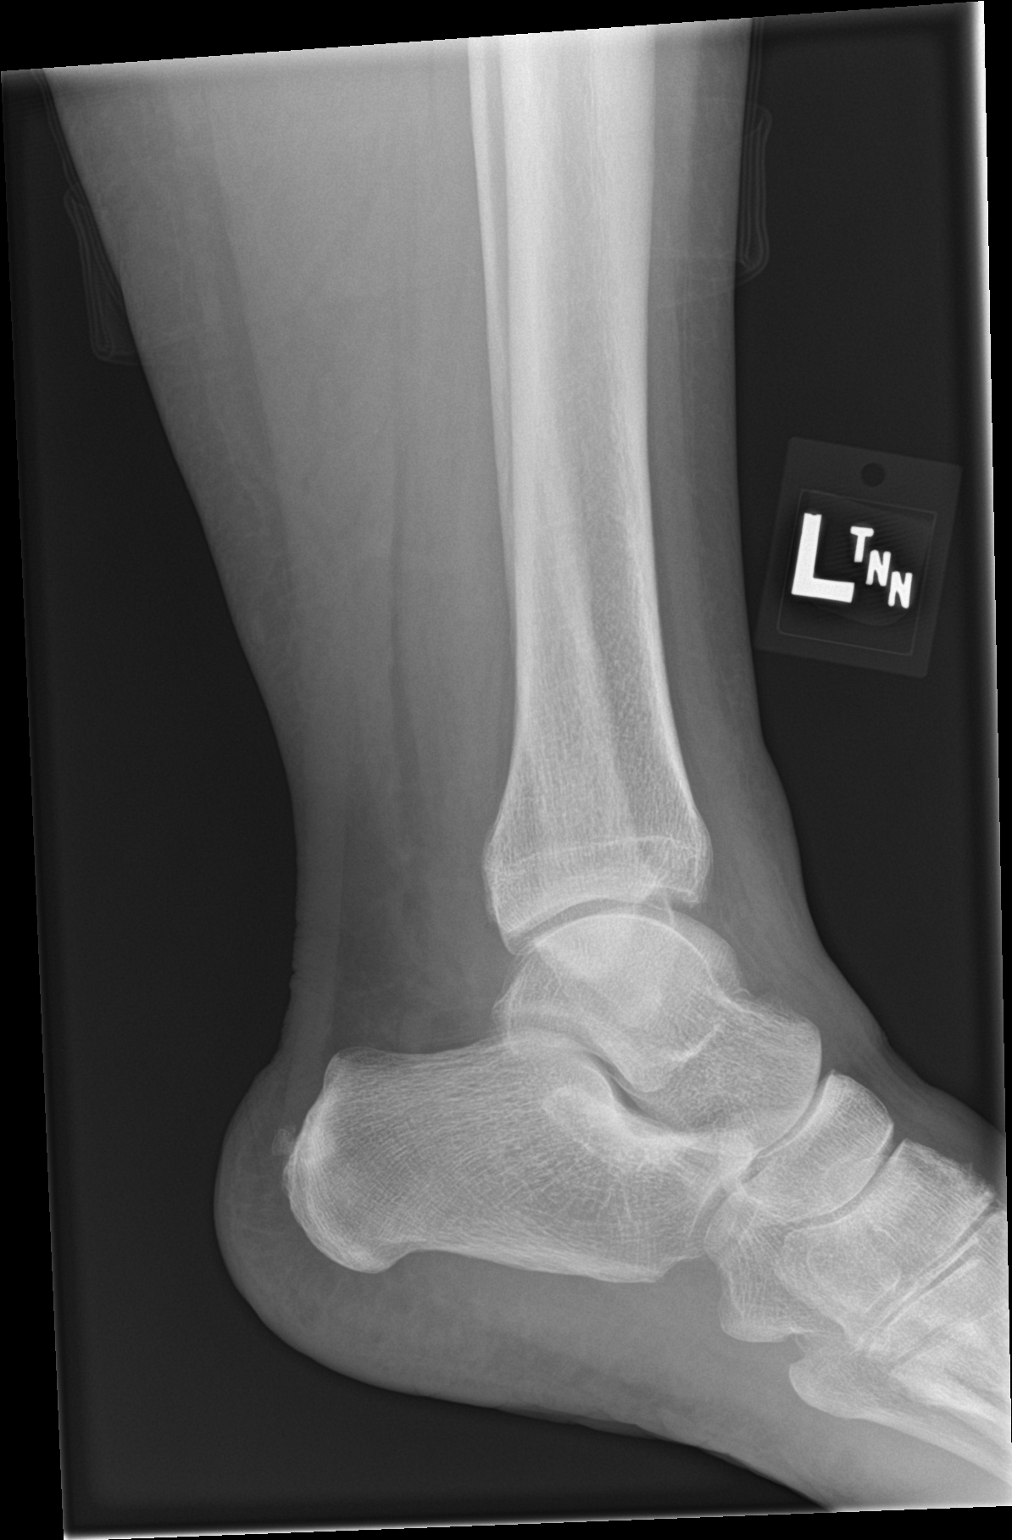

[3 of 3 positions shown; findings below may reference images not displayed]

FINDINGS: Mild lateral soft tissue swelling. No acute bony abnormality.
Specifically, no fracture, subluxation, or dislocation. Joint spaces
maintained.
IMPRESSION: No acute bony abnormality.

## 2019-12-17 MED ORDER — DOCUSATE SODIUM 100 MG PO CAPS: 100.0000 mg | ORAL_CAPSULE | Freq: Every day | ORAL | 2 refills | Status: DC | PRN

## 2019-12-17 MED ORDER — HYDROCODONE-ACETAMINOPHEN 5-325 MG PO TABS: 1.0000 | ORAL_TABLET | Freq: Four times a day (QID) | ORAL | 0 refills | Status: DC | PRN

## 2019-12-17 MED ORDER — HYDROCODONE-ACETAMINOPHEN 5-325 MG PO TABS
2.0000 | ORAL_TABLET | Freq: Once | ORAL | Status: AC
  Administered 2019-12-17: 2 via ORAL
  Filled 2019-12-17: qty 2

## 2019-12-17 NOTE — ED Provider Notes (Signed)
[facility] Emergency Department Provider Note       Time seen: ----------------------------------------- 7:30 AM on 12/17/2019 -----------------------------------------   I have reviewed the triage vital signs and the nursing notes.  HISTORY   Chief Complaint Ankle Injury   HPI Sara Mcdonald is a 73 y.o. female with a history of anxiety, depression, diabetes, fibromyalgia, headache, hypertension who presents to the ED for bilateral ankle pain.  Patient states she rolled both ankles last night around 11 PM while she was getting up to let her dog go outside.  Right ankle seems to be worse than the left.  She cannot bear weight on either.  She denies any other injuries or complaints, pain is 10 out of 10.  Past Medical History:  Diagnosis Date  . Anxiety   . Depression   . Diabetes mellitus without complication ([place])   . Elevated lipids   . Fibromyalgia   . Headache   . Hypertension   . OA (osteoarthritis)   . Sleep apnea     Patient Active Problem List   Diagnosis Date Noted  . Sepsis ([place]) 03/17/2017  . Community acquired pneumonia 03/17/2017  . COPD with acute exacerbation ([place]) 03/17/2017  . Hypoxia 03/17/2017  . Hyponatremia 03/17/2017  . Acute on chronic renal insufficiency 03/17/2017  . Pneumonia 03/17/2017  . Acute respiratory failure with hypoxia ([place]) 02/01/2017    Past Surgical History:  Procedure Laterality Date  . ABDOMINAL HYSTERECTOMY    . APPENDECTOMY    . CHOLECYSTECTOMY    . COLONOSCOPY    . COLONOSCOPY WITH PROPOFOL N/A 07/07/2016   Procedure: COLONOSCOPY WITH PROPOFOL;  Surgeon: Manya Silvas, MD;  Location: [facility] ENDOSCOPY;  Service: Endoscopy;  Laterality: N/A;  . ESOPHAGOGASTRODUODENOSCOPY    . FLEXIBLE SIGMOIDOSCOPY    . nanoflex Bilateral     Allergies Penicillins, Sulfur, and Tricor [fenofibrate]  Social History Social History   Tobacco Use  . Smoking status: Never Smoker  . Smokeless tobacco: Never  Used  Substance Use Topics  . Alcohol use: No  . Drug use: No    Review of Systems Constitutional: Negative for fever. Cardiovascular: Negative for chest pain. Respiratory: Negative for shortness of breath. Gastrointestinal: Negative for abdominal pain Musculoskeletal: Positive for bilateral ankle pain Skin: Negative for rash. Neurological: Negative for headaches, focal weakness or numbness.  All systems negative/normal/unremarkable except as stated in the HPI  ____________________________________________   PHYSICAL EXAM:  VITAL SIGNS: ED Triage Vitals [12/17/19 0725]  Enc Vitals Group     BP (!) 159/78     Pulse Rate (!) 110     Resp 18     Temp 98 F (36.7 C)     Temp Source Oral     SpO2 100 %     Weight 200 lb (90.7 kg)     Height 4\' 11"  (1.499 m)     Head Circumference      Peak Flow      Pain Score 10     Pain Loc      Pain Edu?      Excl. in [place]?    Constitutional: Alert and oriented.  Mild distress from pain Eyes: Conjunctivae are normal. Normal extraocular movements. Cardiovascular: Normal rate, regular rhythm. No murmurs, rubs, or gallops. Respiratory: Normal respiratory effort without tachypnea nor retractions. Breath sounds are clear and equal bilaterally. No wheezes/rales/rhonchi. Musculoskeletal: There is ecchymosis and swelling noted particular around the right ankle laterally, there is exquisite pain with range of  motion the right ankle.  There is also some swelling and warmth noted to the lateral aspect of the left ankle, not as much swelling is noted. Neurologic: No gross focal neurologic deficits are appreciated.  Skin:  Skin is warm, dry and intact. No rash noted. Psychiatric: Mood and affect are normal.  ____________________________________________  ED COURSE:  As part of my medical decision making, I reviewed the following data within the [place] History obtained from family if available, nursing notes, old chart and ekg,  as well as notes from prior ED visits. Patient presented for ankle injuries, we will assess with imaging as indicated at this time.   Procedures  DELANI KOHLI was evaluated in Emergency Department on 12/17/2019 for the symptoms described in the history of present illness. She was evaluated in the context of the global COVID-19 pandemic, which necessitated consideration that the patient might be at risk for infection with the SARS-CoV-2 virus that causes COVID-19. Institutional protocols and algorithms that pertain to the evaluation of patients at risk for COVID-19 are in a state of rapid change based on information released by regulatory bodies including the CDC and federal and state organizations. These policies and algorithms were followed during the patient's care in the ED.  ____________________________________________   RADIOLOGY Images were viewed by me  Bilateral ankle x-rays  IMPRESSION:  Possible nondisplaced cortical avulsion fracture of the distal right  fibula.  IMPRESSION:  No acute bony abnormality.  ____________________________________________   DIFFERENTIAL DIAGNOSIS   Fracture, sprain, dislocation, contusion  FINAL ASSESSMENT AND PLAN  Left ankle sprain, right ankle sprain, right ankle avulsion fracture of the distal right fibula   Plan: The patient had presented for bilateral ankle injuries. Patient's imaging indicated essentially bilateral ankle sprains with a slight chip fracture off the distal right fibula.  We have attempted to place her in a walking boot on the right ankle and Ace wrap on the left ankle.  Patient thinks she will also walk better with a walker.  Should be referred to orthopedics for outpatient follow-up.   Laurence Aly, MD    Note: This note was generated in part or whole with voice recognition software. Voice recognition is usually quite accurate but there are transcription errors that can and very often do occur. I apologize for  any typographical errors that were not detected and corrected.     Earleen Newport, MD 12/17/19 [phone]

## 2019-12-17 NOTE — ED Triage Notes (Signed)
Pt comes into the ED via EMS from home, states she rolled both her ankles last night around 11pm while getting up to the let her dog outside. Right ankle has moderate swelling and pain and the left ankle has minimal swelling and pain.

## 2020-01-10 DIAGNOSIS — M25471 Effusion, right ankle: Secondary | ICD-10-CM | POA: Diagnosis not present

## 2020-01-10 DIAGNOSIS — M25571 Pain in right ankle and joints of right foot: Secondary | ICD-10-CM | POA: Diagnosis not present

## 2020-01-10 DIAGNOSIS — S82892A Other fracture of left lower leg, initial encounter for closed fracture: Secondary | ICD-10-CM | POA: Diagnosis not present

## 2020-01-10 DIAGNOSIS — S93402A Sprain of unspecified ligament of left ankle, initial encounter: Secondary | ICD-10-CM | POA: Diagnosis not present

## 2020-01-10 DIAGNOSIS — M25572 Pain in left ankle and joints of left foot: Secondary | ICD-10-CM | POA: Diagnosis not present

## 2020-01-10 DIAGNOSIS — S93401A Sprain of unspecified ligament of right ankle, initial encounter: Secondary | ICD-10-CM | POA: Diagnosis not present

## 2020-01-10 DIAGNOSIS — M25472 Effusion, left ankle: Secondary | ICD-10-CM | POA: Diagnosis not present

## 2020-01-10 DIAGNOSIS — R262 Difficulty in walking, not elsewhere classified: Secondary | ICD-10-CM | POA: Diagnosis not present

## 2020-01-10 DIAGNOSIS — S8261XA Displaced fracture of lateral malleolus of right fibula, initial encounter for closed fracture: Secondary | ICD-10-CM | POA: Diagnosis not present

## 2020-01-10 DIAGNOSIS — Z9181 History of falling: Secondary | ICD-10-CM | POA: Diagnosis not present

## 2020-02-19 DIAGNOSIS — E1165 Type 2 diabetes mellitus with hyperglycemia: Secondary | ICD-10-CM | POA: Diagnosis not present

## 2020-02-25 DIAGNOSIS — M797 Fibromyalgia: Secondary | ICD-10-CM | POA: Diagnosis not present

## 2020-02-25 DIAGNOSIS — N1832 Chronic kidney disease, stage 3b: Secondary | ICD-10-CM | POA: Diagnosis not present

## 2020-02-25 DIAGNOSIS — R5382 Chronic fatigue, unspecified: Secondary | ICD-10-CM | POA: Diagnosis not present

## 2020-02-25 DIAGNOSIS — J40 Bronchitis, not specified as acute or chronic: Secondary | ICD-10-CM | POA: Diagnosis not present

## 2020-02-25 DIAGNOSIS — E1169 Type 2 diabetes mellitus with other specified complication: Secondary | ICD-10-CM | POA: Diagnosis not present

## 2020-02-25 DIAGNOSIS — E782 Mixed hyperlipidemia: Secondary | ICD-10-CM | POA: Diagnosis not present

## 2020-02-28 DIAGNOSIS — S93401D Sprain of unspecified ligament of right ankle, subsequent encounter: Secondary | ICD-10-CM | POA: Diagnosis not present

## 2020-02-28 DIAGNOSIS — S8261XD Displaced fracture of lateral malleolus of right fibula, subsequent encounter for closed fracture with routine healing: Secondary | ICD-10-CM | POA: Diagnosis not present

## 2020-02-28 DIAGNOSIS — Z9181 History of falling: Secondary | ICD-10-CM | POA: Diagnosis not present

## 2020-02-28 DIAGNOSIS — S93402D Sprain of unspecified ligament of left ankle, subsequent encounter: Secondary | ICD-10-CM | POA: Diagnosis not present

## 2020-02-28 DIAGNOSIS — R262 Difficulty in walking, not elsewhere classified: Secondary | ICD-10-CM | POA: Diagnosis not present

## 2020-02-28 DIAGNOSIS — S82892D Other fracture of left lower leg, subsequent encounter for closed fracture with routine healing: Secondary | ICD-10-CM | POA: Diagnosis not present

## 2020-04-08 DIAGNOSIS — M5116 Intervertebral disc disorders with radiculopathy, lumbar region: Secondary | ICD-10-CM | POA: Diagnosis not present

## 2020-04-25 DIAGNOSIS — R0789 Other chest pain: Secondary | ICD-10-CM | POA: Diagnosis not present

## 2020-04-25 DIAGNOSIS — Z8709 Personal history of other diseases of the respiratory system: Secondary | ICD-10-CM | POA: Diagnosis not present

## 2020-04-25 DIAGNOSIS — R0602 Shortness of breath: Secondary | ICD-10-CM | POA: Diagnosis not present

## 2020-05-06 DIAGNOSIS — E1169 Type 2 diabetes mellitus with other specified complication: Secondary | ICD-10-CM | POA: Diagnosis not present

## 2020-05-06 DIAGNOSIS — M8588 Other specified disorders of bone density and structure, other site: Secondary | ICD-10-CM | POA: Diagnosis not present

## 2020-05-06 DIAGNOSIS — E782 Mixed hyperlipidemia: Secondary | ICD-10-CM | POA: Diagnosis not present

## 2020-05-12 DIAGNOSIS — G4733 Obstructive sleep apnea (adult) (pediatric): Secondary | ICD-10-CM | POA: Diagnosis not present

## 2020-05-12 DIAGNOSIS — E782 Mixed hyperlipidemia: Secondary | ICD-10-CM | POA: Diagnosis not present

## 2020-05-12 DIAGNOSIS — E1169 Type 2 diabetes mellitus with other specified complication: Secondary | ICD-10-CM | POA: Diagnosis not present

## 2020-05-12 DIAGNOSIS — Z9989 Dependence on other enabling machines and devices: Secondary | ICD-10-CM | POA: Diagnosis not present

## 2020-05-12 DIAGNOSIS — Z0000 Encounter for general adult medical examination without abnormal findings: Secondary | ICD-10-CM | POA: Diagnosis not present

## 2020-05-12 DIAGNOSIS — N1832 Chronic kidney disease, stage 3b: Secondary | ICD-10-CM | POA: Diagnosis not present

## 2020-05-12 DIAGNOSIS — F330 Major depressive disorder, recurrent, mild: Secondary | ICD-10-CM | POA: Diagnosis not present

## 2020-09-04 ENCOUNTER — Inpatient Hospital Stay
Admission: EM | Admit: 2020-09-04 | Discharge: 2020-09-07 | DRG: 871 | Disposition: A | Discharge status: Home Health | Payer: PPO | Attending: Internal Medicine | Admitting: Internal Medicine

## 2020-09-04 ENCOUNTER — Emergency Department: Payer: PPO

## 2020-09-04 ENCOUNTER — Inpatient Hospital Stay: Payer: PPO

## 2020-09-04 ENCOUNTER — Other Ambulatory Visit: Payer: Self-pay

## 2020-09-04 DIAGNOSIS — G4733 Obstructive sleep apnea (adult) (pediatric): Secondary | ICD-10-CM | POA: Diagnosis present

## 2020-09-04 DIAGNOSIS — K5730 Diverticulosis of large intestine without perforation or abscess without bleeding: Secondary | ICD-10-CM | POA: Diagnosis not present

## 2020-09-04 DIAGNOSIS — R1084 Generalized abdominal pain: Secondary | ICD-10-CM

## 2020-09-04 DIAGNOSIS — R110 Nausea: Secondary | ICD-10-CM | POA: Diagnosis not present

## 2020-09-04 DIAGNOSIS — I708 Atherosclerosis of other arteries: Secondary | ICD-10-CM | POA: Diagnosis not present

## 2020-09-04 DIAGNOSIS — J209 Acute bronchitis, unspecified: Secondary | ICD-10-CM | POA: Diagnosis not present

## 2020-09-04 DIAGNOSIS — R6520 Severe sepsis without septic shock: Secondary | ICD-10-CM | POA: Diagnosis present

## 2020-09-04 DIAGNOSIS — J4540 Moderate persistent asthma, uncomplicated: Secondary | ICD-10-CM

## 2020-09-04 DIAGNOSIS — G9341 Metabolic encephalopathy: Secondary | ICD-10-CM | POA: Diagnosis present

## 2020-09-04 DIAGNOSIS — Z833 Family history of diabetes mellitus: Secondary | ICD-10-CM | POA: Diagnosis not present

## 2020-09-04 DIAGNOSIS — N1832 Chronic kidney disease, stage 3b: Secondary | ICD-10-CM | POA: Diagnosis not present

## 2020-09-04 DIAGNOSIS — Z7984 Long term (current) use of oral hypoglycemic drugs: Secondary | ICD-10-CM | POA: Diagnosis not present

## 2020-09-04 DIAGNOSIS — E1122 Type 2 diabetes mellitus with diabetic chronic kidney disease: Secondary | ICD-10-CM | POA: Diagnosis present

## 2020-09-04 DIAGNOSIS — E1159 Type 2 diabetes mellitus with other circulatory complications: Secondary | ICD-10-CM | POA: Diagnosis present

## 2020-09-04 DIAGNOSIS — Z880 Allergy status to penicillin: Secondary | ICD-10-CM

## 2020-09-04 DIAGNOSIS — N390 Urinary tract infection, site not specified: Secondary | ICD-10-CM | POA: Diagnosis not present

## 2020-09-04 DIAGNOSIS — M797 Fibromyalgia: Secondary | ICD-10-CM | POA: Diagnosis present

## 2020-09-04 DIAGNOSIS — R531 Weakness: Secondary | ICD-10-CM | POA: Diagnosis not present

## 2020-09-04 DIAGNOSIS — I700 Atherosclerosis of aorta: Secondary | ICD-10-CM | POA: Diagnosis not present

## 2020-09-04 DIAGNOSIS — E1169 Type 2 diabetes mellitus with other specified complication: Secondary | ICD-10-CM

## 2020-09-04 DIAGNOSIS — I129 Hypertensive chronic kidney disease with stage 1 through stage 4 chronic kidney disease, or unspecified chronic kidney disease: Secondary | ICD-10-CM | POA: Diagnosis not present

## 2020-09-04 DIAGNOSIS — R0902 Hypoxemia: Secondary | ICD-10-CM | POA: Diagnosis not present

## 2020-09-04 DIAGNOSIS — E785 Hyperlipidemia, unspecified: Secondary | ICD-10-CM | POA: Diagnosis not present

## 2020-09-04 DIAGNOSIS — Z20822 Contact with and (suspected) exposure to covid-19: Secondary | ICD-10-CM | POA: Diagnosis not present

## 2020-09-04 DIAGNOSIS — R1032 Left lower quadrant pain: Secondary | ICD-10-CM | POA: Diagnosis not present

## 2020-09-04 DIAGNOSIS — M1990 Unspecified osteoarthritis, unspecified site: Secondary | ICD-10-CM | POA: Diagnosis present

## 2020-09-04 DIAGNOSIS — F418 Other specified anxiety disorders: Secondary | ICD-10-CM | POA: Diagnosis present

## 2020-09-04 DIAGNOSIS — Z8249 Family history of ischemic heart disease and other diseases of the circulatory system: Secondary | ICD-10-CM

## 2020-09-04 DIAGNOSIS — I959 Hypotension, unspecified: Secondary | ICD-10-CM | POA: Diagnosis not present

## 2020-09-04 DIAGNOSIS — Z66 Do not resuscitate: Secondary | ICD-10-CM | POA: Diagnosis present

## 2020-09-04 DIAGNOSIS — N179 Acute kidney failure, unspecified: Secondary | ICD-10-CM | POA: Diagnosis present

## 2020-09-04 DIAGNOSIS — F419 Anxiety disorder, unspecified: Secondary | ICD-10-CM | POA: Diagnosis not present

## 2020-09-04 DIAGNOSIS — R0602 Shortness of breath: Secondary | ICD-10-CM | POA: Diagnosis not present

## 2020-09-04 DIAGNOSIS — E119 Type 2 diabetes mellitus without complications: Secondary | ICD-10-CM

## 2020-09-04 DIAGNOSIS — A419 Sepsis, unspecified organism: Principal | ICD-10-CM | POA: Diagnosis present

## 2020-09-04 DIAGNOSIS — Z888 Allergy status to other drugs, medicaments and biological substances status: Secondary | ICD-10-CM

## 2020-09-04 DIAGNOSIS — R109 Unspecified abdominal pain: Secondary | ICD-10-CM | POA: Diagnosis present

## 2020-09-04 DIAGNOSIS — Z79899 Other long term (current) drug therapy: Secondary | ICD-10-CM | POA: Diagnosis not present

## 2020-09-04 DIAGNOSIS — J449 Chronic obstructive pulmonary disease, unspecified: Secondary | ICD-10-CM | POA: Diagnosis not present

## 2020-09-04 DIAGNOSIS — R4020 Unspecified coma: Secondary | ICD-10-CM | POA: Diagnosis not present

## 2020-09-04 DIAGNOSIS — Z803 Family history of malignant neoplasm of breast: Secondary | ICD-10-CM | POA: Diagnosis not present

## 2020-09-04 DIAGNOSIS — Z9049 Acquired absence of other specified parts of digestive tract: Secondary | ICD-10-CM | POA: Diagnosis not present

## 2020-09-04 DIAGNOSIS — F329 Major depressive disorder, single episode, unspecified: Secondary | ICD-10-CM | POA: Diagnosis not present

## 2020-09-04 DIAGNOSIS — R000 Tachycardia, unspecified: Secondary | ICD-10-CM | POA: Diagnosis not present

## 2020-09-04 DIAGNOSIS — N189 Chronic kidney disease, unspecified: Secondary | ICD-10-CM | POA: Diagnosis not present

## 2020-09-04 DIAGNOSIS — Z882 Allergy status to sulfonamides status: Secondary | ICD-10-CM

## 2020-09-04 DIAGNOSIS — Z825 Family history of asthma and other chronic lower respiratory diseases: Secondary | ICD-10-CM

## 2020-09-04 DIAGNOSIS — J90 Pleural effusion, not elsewhere classified: Secondary | ICD-10-CM | POA: Diagnosis not present

## 2020-09-04 DIAGNOSIS — R52 Pain, unspecified: Secondary | ICD-10-CM | POA: Diagnosis not present

## 2020-09-04 DIAGNOSIS — I152 Hypertension secondary to endocrine disorders: Secondary | ICD-10-CM | POA: Diagnosis present

## 2020-09-04 DIAGNOSIS — Z823 Family history of stroke: Secondary | ICD-10-CM

## 2020-09-04 LAB — COMPREHENSIVE METABOLIC PANEL
ALT: 22 U/L (ref 0–44)
AST: 61 U/L — ABNORMAL HIGH (ref 15–41)
Albumin: 3.7 g/dL (ref 3.5–5.0)
Alkaline Phosphatase: 66 U/L (ref 38–126)
Anion gap: 13 (ref 5–15)
BUN: 45 mg/dL — ABNORMAL HIGH (ref 8–23)
CO2: 25 mmol/L (ref 22–32)
Calcium: 8.8 mg/dL — ABNORMAL LOW (ref 8.9–10.3)
Chloride: 99 mmol/L (ref 98–111)
Creatinine, Ser: 3.25 mg/dL — ABNORMAL HIGH (ref 0.44–1.00)
GFR calc Af Amer: 15 mL/min — ABNORMAL LOW (ref >60)
GFR calc non Af Amer: 13 mL/min — ABNORMAL LOW (ref >60)
Glucose, Bld: 129 mg/dL — ABNORMAL HIGH (ref 70–99)
Potassium: 4.3 mmol/L (ref 3.5–5.1)
Sodium: 137 mmol/L (ref 135–145)
Total Bilirubin: 0.8 mg/dL (ref 0.3–1.2)
Total Protein: 7 g/dL (ref 6.5–8.1)

## 2020-09-04 LAB — URINALYSIS, COMPLETE (UACMP) WITH MICROSCOPIC
Bilirubin Urine: NEGATIVE
Glucose, UA: NEGATIVE mg/dL
Hgb urine dipstick: NEGATIVE
Ketones, ur: NEGATIVE mg/dL
Nitrite: NEGATIVE
Protein, ur: 30 mg/dL — AB
Specific Gravity, Urine: 1.02 (ref 1.005–1.030)
WBC, UA: >50 WBC/hpf — ABNORMAL HIGH (ref 0–5)
pH: 5 (ref 5.0–8.0)

## 2020-09-04 LAB — CBC
HCT: 38.6 % (ref 36.0–46.0)
Hemoglobin: 13.2 g/dL (ref 12.0–15.0)
MCH: 30.9 pg (ref 26.0–34.0)
MCHC: 34.2 g/dL (ref 30.0–36.0)
MCV: 90.4 fL (ref 80.0–100.0)
Platelets: 253 10*3/uL (ref 150–400)
RBC: 4.27 MIL/uL (ref 3.87–5.11)
RDW: 13.1 % (ref 11.5–15.5)
WBC: 11.3 10*3/uL — ABNORMAL HIGH (ref 4.0–10.5)
nRBC: 0 % (ref 0.0–0.2)

## 2020-09-04 LAB — LIPASE, BLOOD: Lipase: 25 U/L (ref 11–51)

## 2020-09-04 IMAGING — US US RENAL
1 series · 14 of 25 positions shown · non-contrast
Comparison: None.

CLINICAL DATA: Acute renal insufficiency.

EXAM:
RENAL / URINARY TRACT ULTRASOUND COMPLETE

[Series 1: us renal · 14 of 30 slices shown]
[im 1/30]
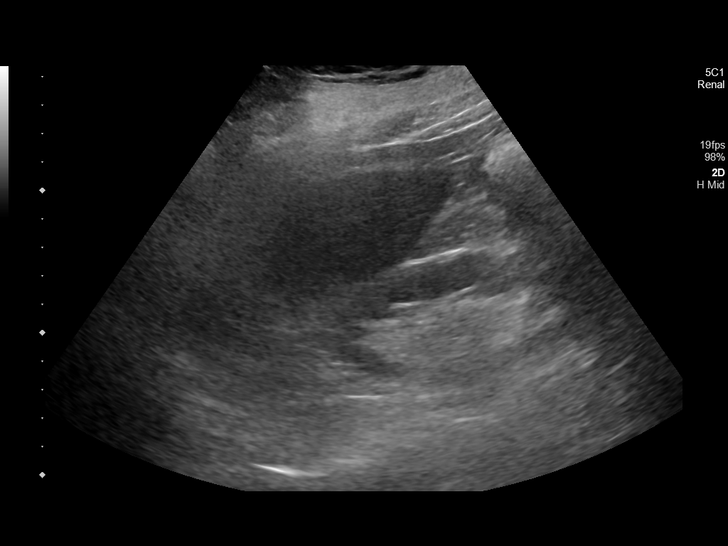
[im 3/30]
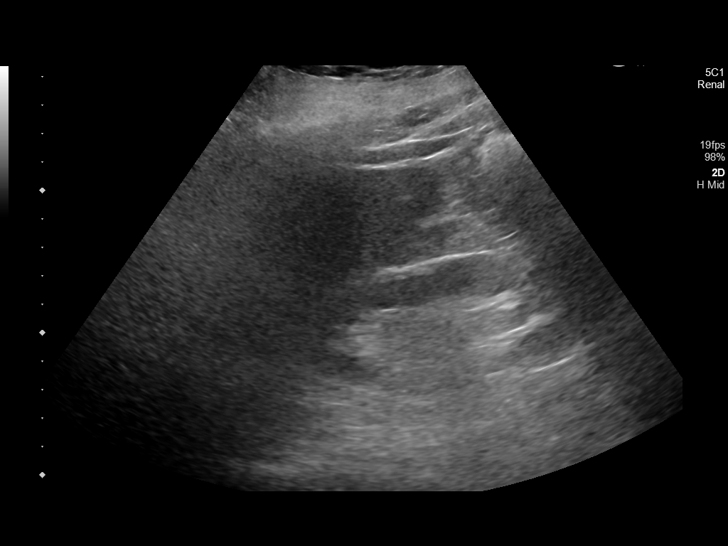
[im 5/30]
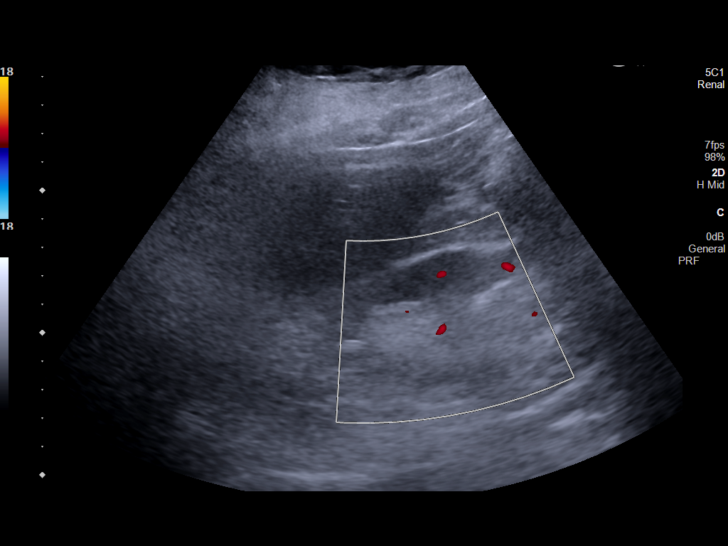
[im 8/30]
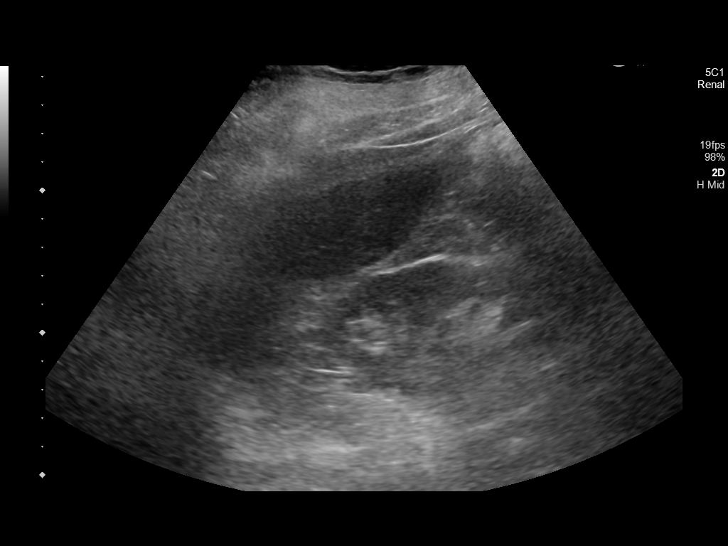
[im 10/30]
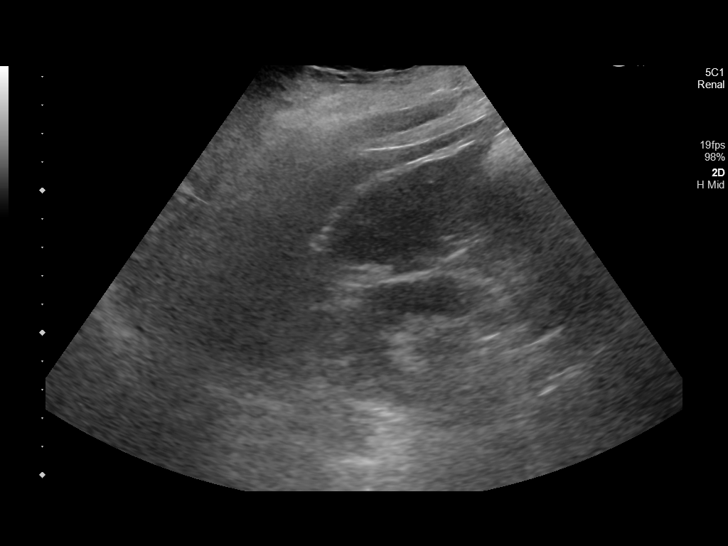
[im 11/30]
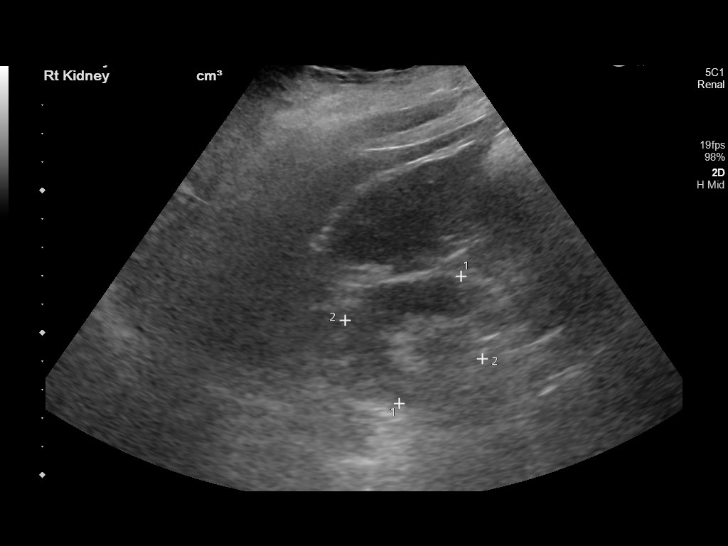
[im 14/30]
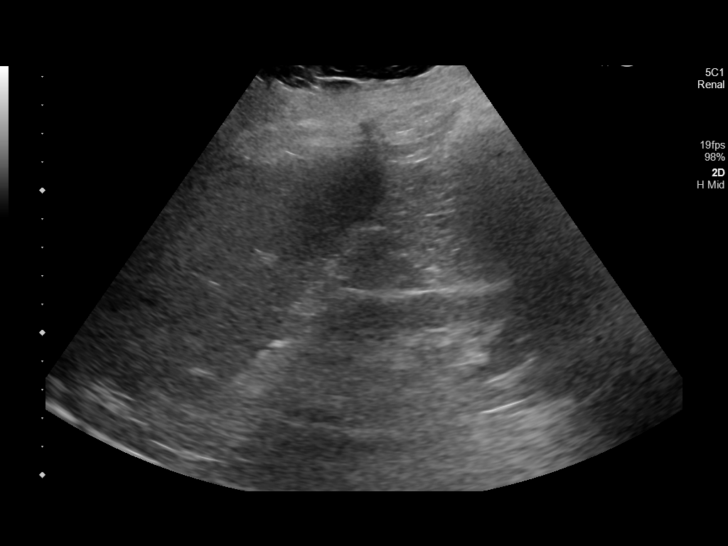
[im 16/30]
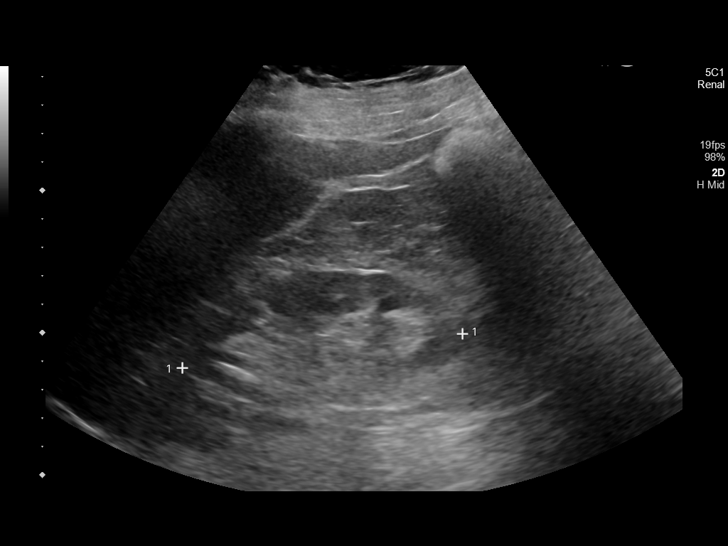
[im 19/30]
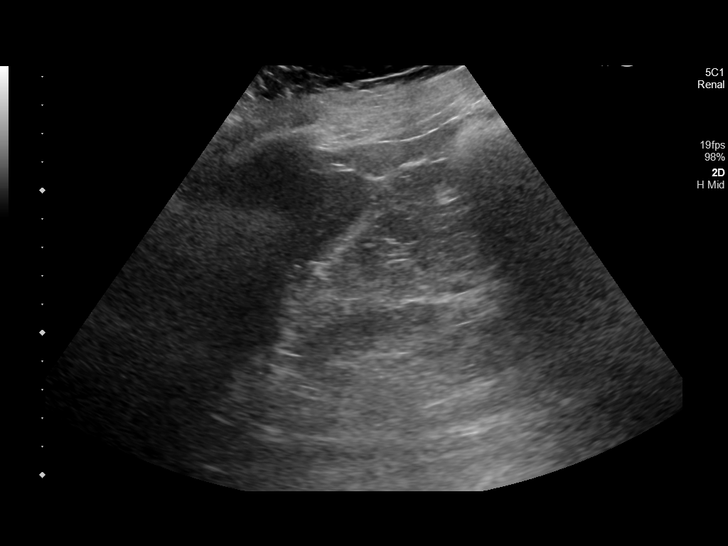
[im 20/30]
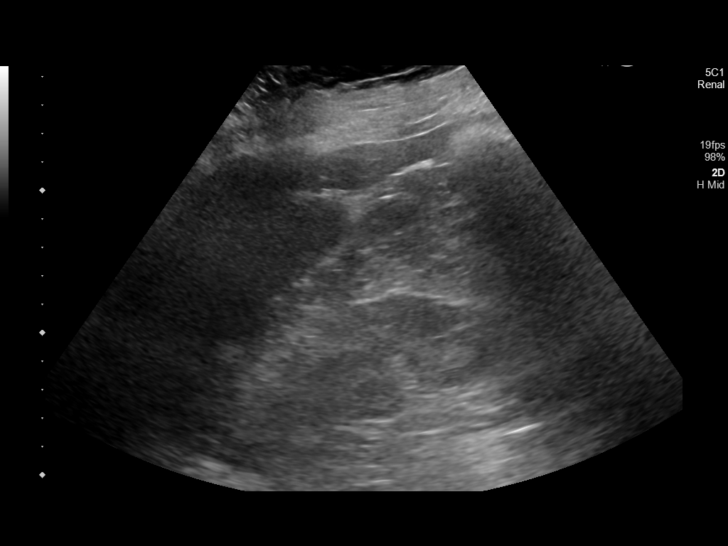
[im 22/30]
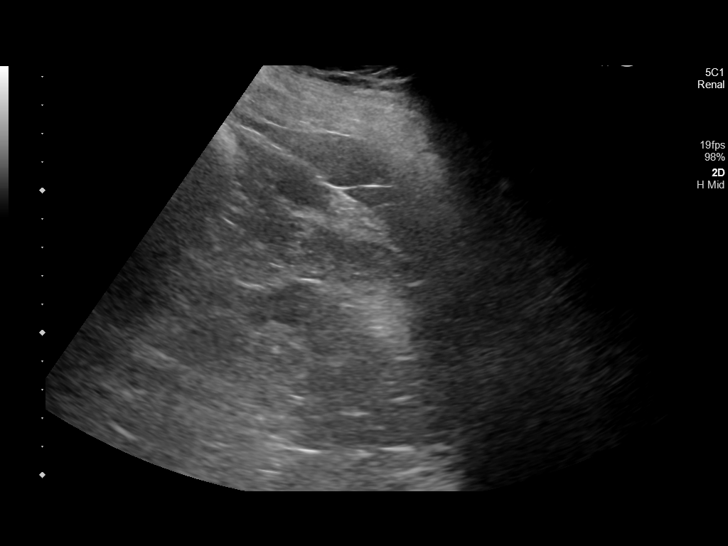
[im 25/30]
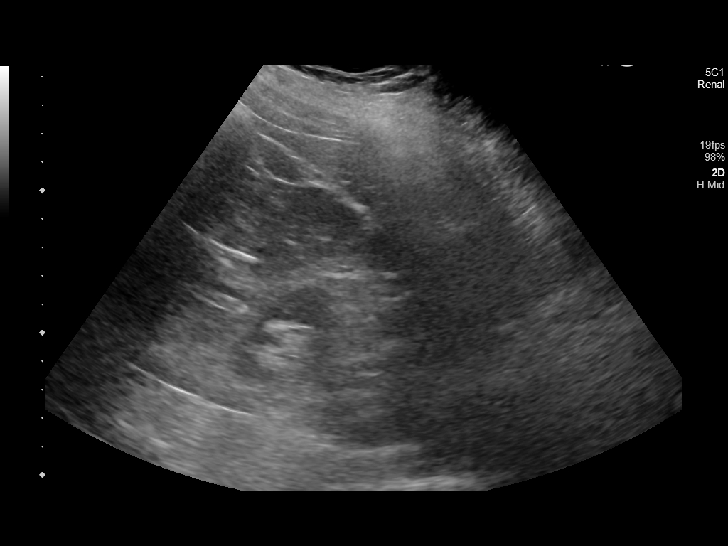
[im 27/30]
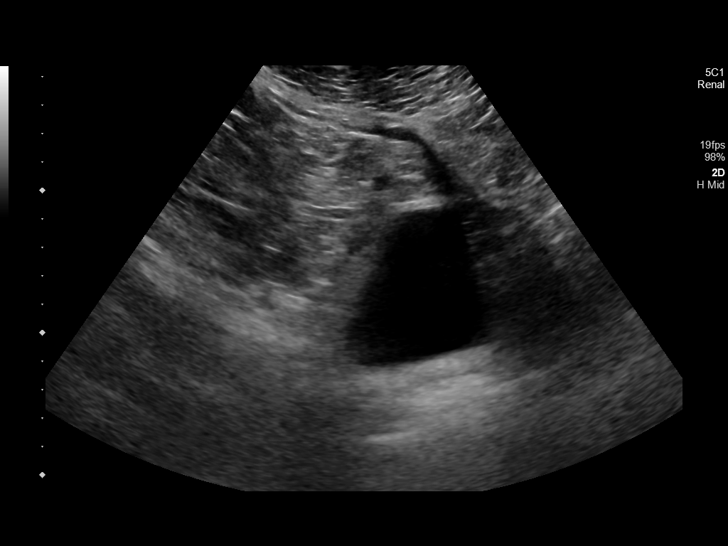
[im 30/30]
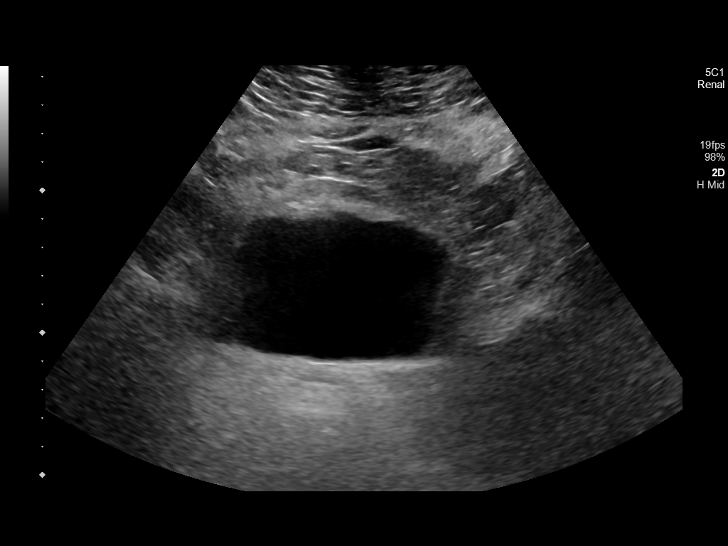

[14 of 25 positions shown; findings below may reference images not displayed]

FINDINGS: Right Kidney:

Renal measurements: 9.9 cm x 5.0 cm x 5.0 cm = volume: 129 mL.
Echogenicity within normal limits. No mass or hydronephrosis
visualized.

Left Kidney:

Renal measurements: 9.9 cm x 4.0 cm x 5.2 cm = volume: 128 mL.
Echogenicity within normal limits. No mass or hydronephrosis
visualized.

Bladder:

Appears normal for degree of bladder distention.

Other:

None.
IMPRESSION: Normal renal ultrasound.

## 2020-09-04 IMAGING — CT CT ABD-PELV W/O CM
2 of 4 series · 17 of 46 positions shown, 19 images · non-contrast
Comparison: None.

CLINICAL DATA: Left lower quadrant pain.

EXAM:
CT ABDOMEN AND PELVIS WITHOUT CONTRAST
TECHNIQUE: Multidetector CT imaging of the abdomen and pelvis was performed
following the standard protocol without IV contrast.

[Series 2: routine abd/pel wo · axial · 0.88mm/px · z∈[-1191,-761]mm · 14 of 96 slices shown, 16 images]
[im 5/96  soft-tissue]
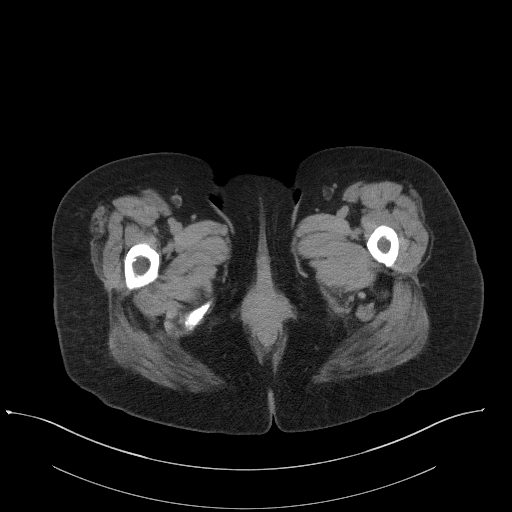
[im 5/96  bone]
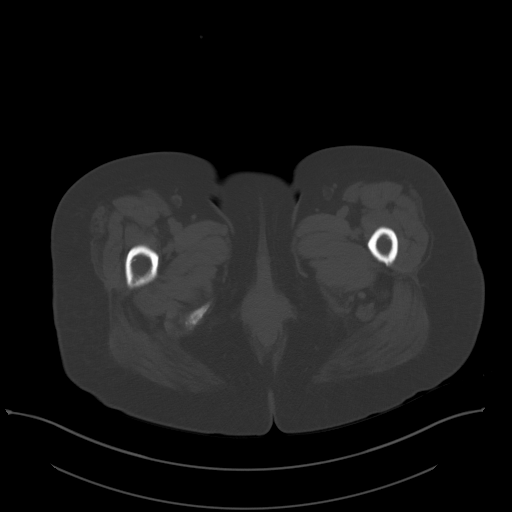
[im 13/96  soft-tissue]
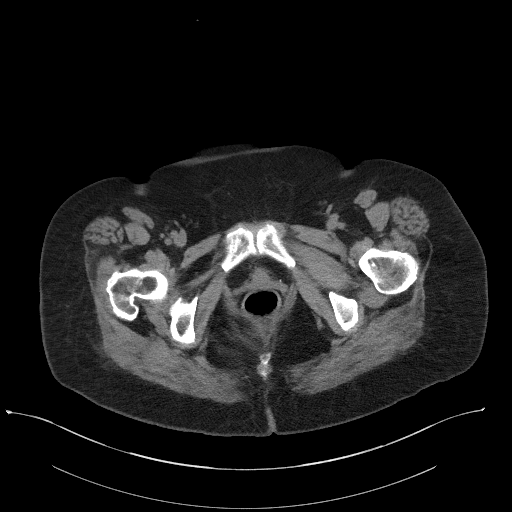
[im 17/96  soft-tissue]
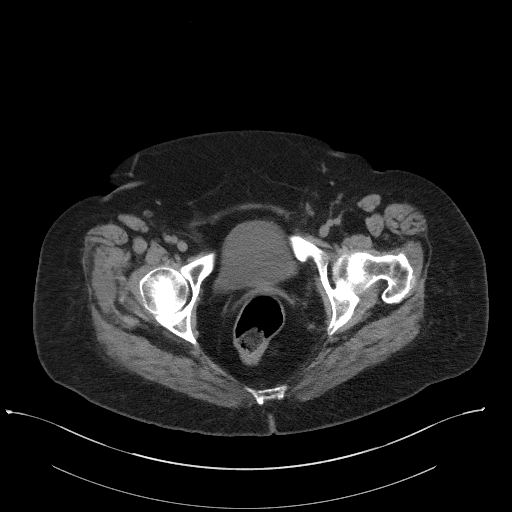
[im 25/96  soft-tissue]
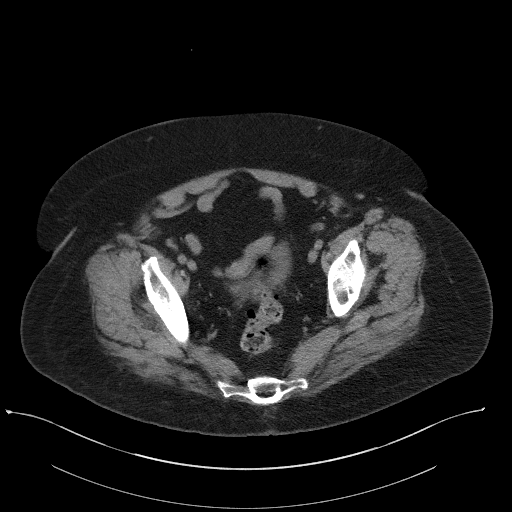
[im 34/96  soft-tissue]
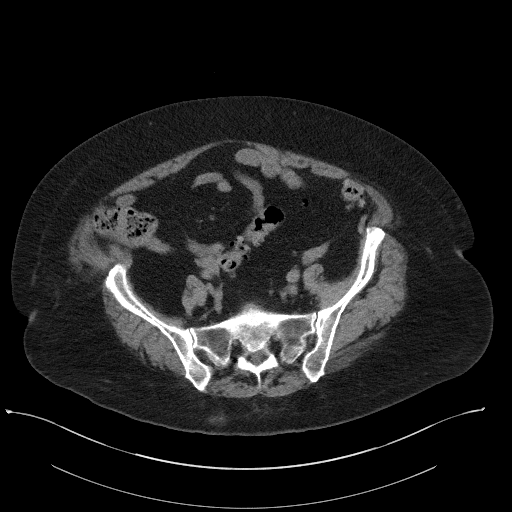
[im 38/96  soft-tissue]
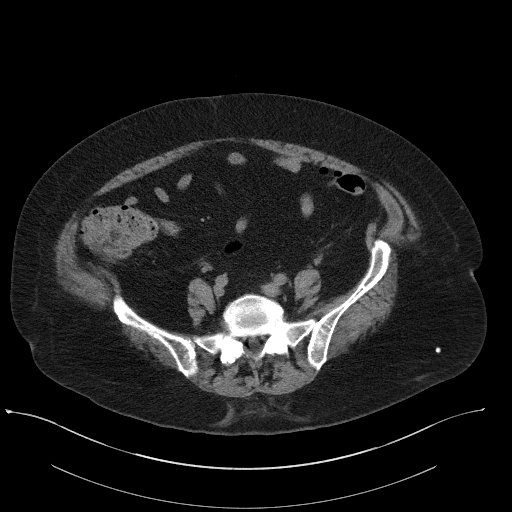
[im 46/96  soft-tissue]
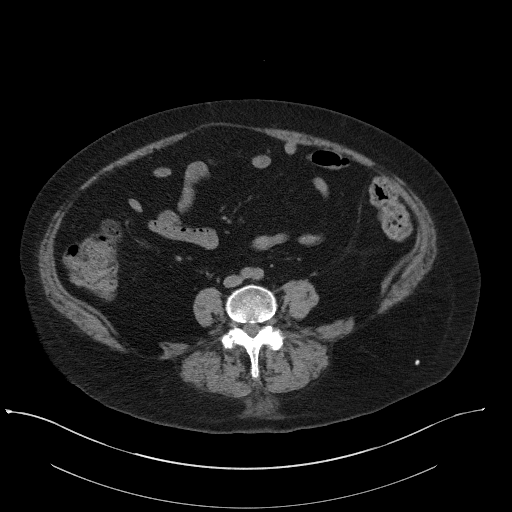
[im 50/96  soft-tissue]
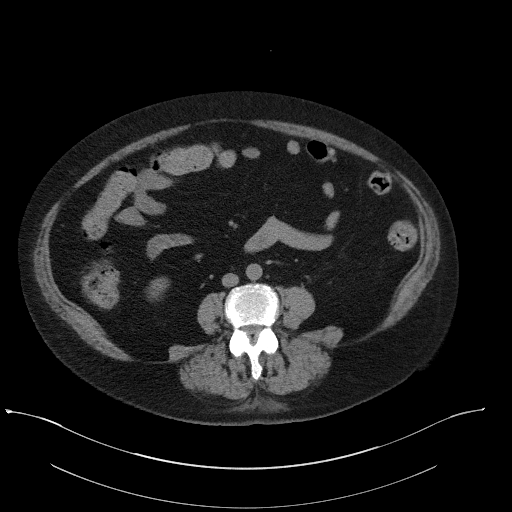
[im 58/96  soft-tissue]
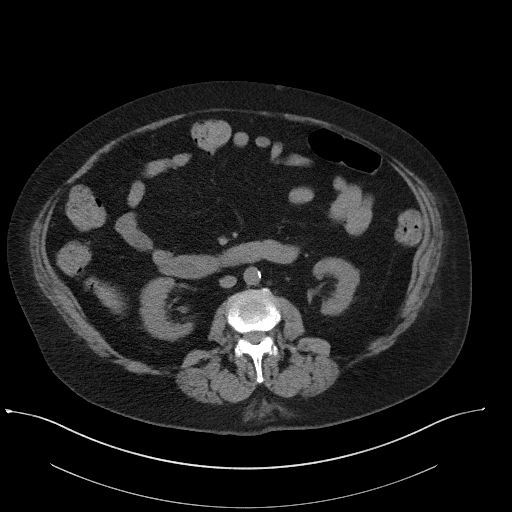
[im 58/96  bone]
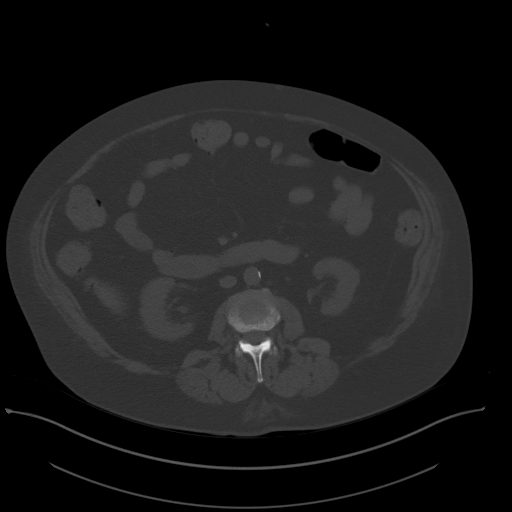
[im 62/96  soft-tissue]
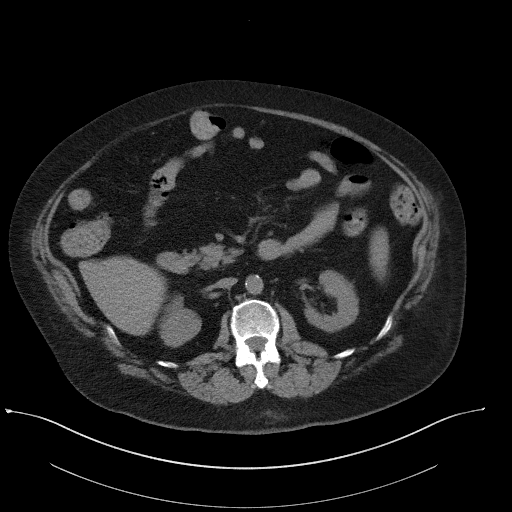
[im 71/96  soft-tissue]
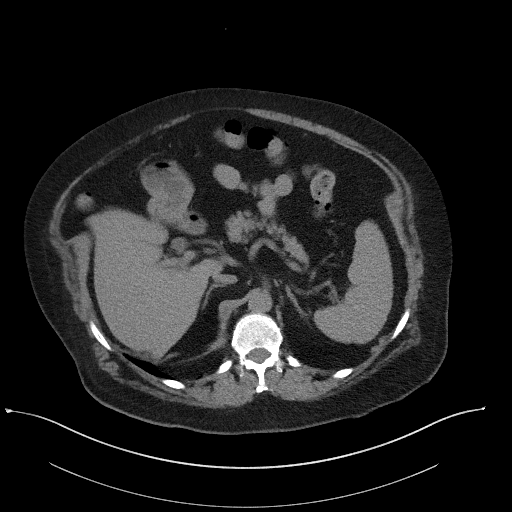
[im 79/96  soft-tissue]
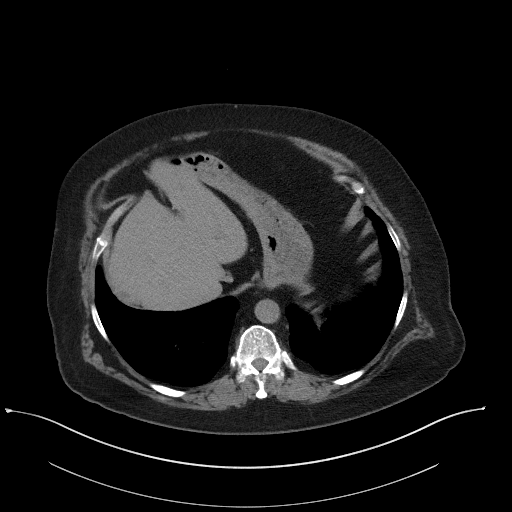
[im 83/96  soft-tissue]
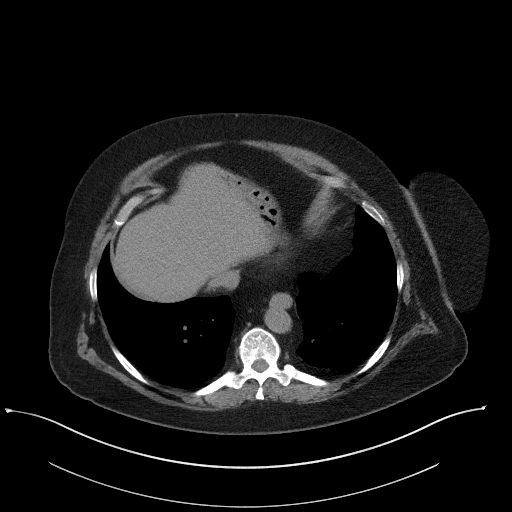
[im 91/96  soft-tissue]
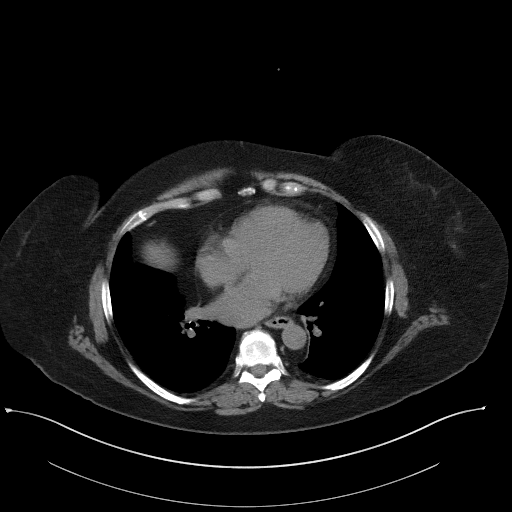

[Series 5: coronal st · coronal · 0.91mm/px · 3 of 97 slices shown]
[im 33/97  soft-tissue]
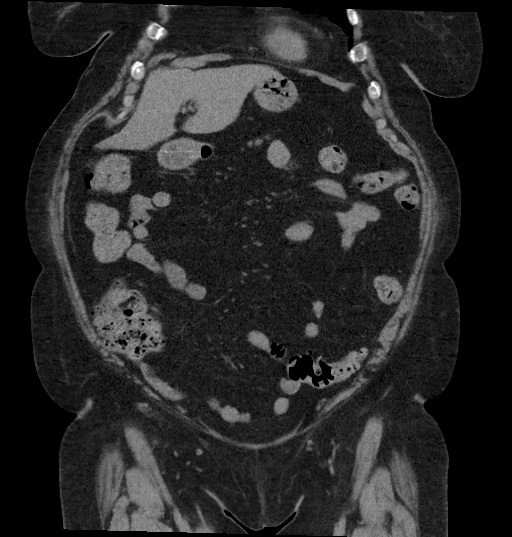
[im 43/97  soft-tissue]
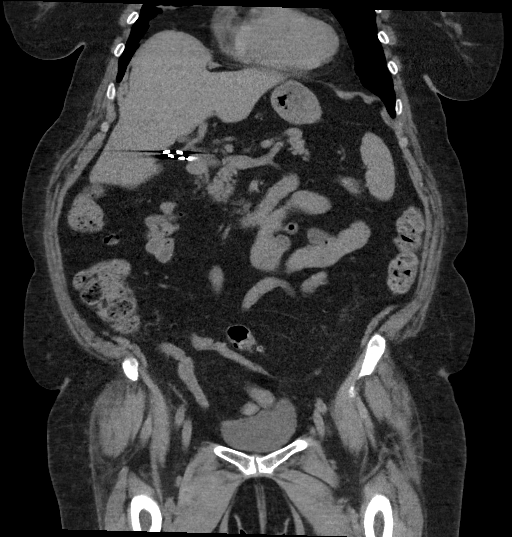
[im 54/97  soft-tissue]
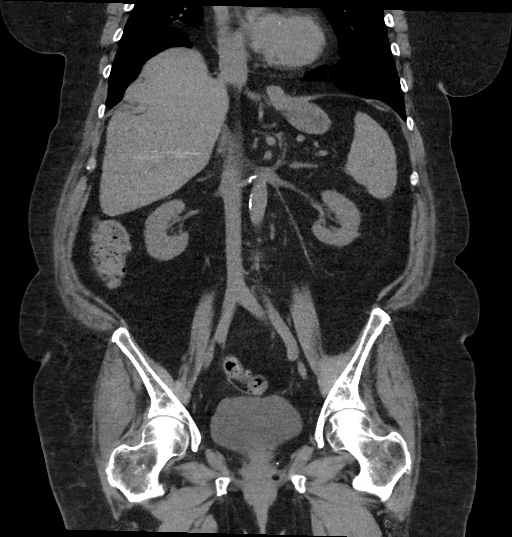

[17 of 46 positions shown; findings below may reference images not displayed]

FINDINGS: Lower chest: No acute abnormality.

Hepatobiliary: No focal liver abnormality is seen. Status post
cholecystectomy. No biliary dilatation.

Pancreas: Unremarkable. No pancreatic ductal dilatation or
surrounding inflammatory changes.

Spleen: Normal in size without focal abnormality.

Adrenals/Urinary Tract: Adrenal glands are unremarkable. Kidneys are
normal, without renal calculi or focal lesions. Stable prominence of
a right extrarenal pelvis is seen. Bladder is unremarkable.

Stomach/Bowel: Stomach is within normal limits. The appendix is not
identified. No evidence of bowel dilatation. Noninflamed diverticula
are seen throughout the large bowel.

Vascular/Lymphatic: There is mild calcification of the abdominal
aorta and bilateral common iliac arteries, without evidence of
aneurysmal dilatation. No enlarged abdominal or pelvic lymph nodes.

Reproductive: Status post hysterectomy. No adnexal masses.

Other: No abdominal wall hernia or abnormality. No abdominopelvic
ascites.

Musculoskeletal: No acute or significant osseous findings.
IMPRESSION: 1. Noninflamed diverticula throughout the large bowel.
2. Evidence of prior cholecystectomy and hysterectomy.
3. Aortic atherosclerosis.

Aortic Atherosclerosis ([S2]-[S2]).

## 2020-09-04 IMAGING — CT CT HEAD W/O CM
3 series · 15 of 46 positions shown, 18 images · non-contrast
Comparison: None.

CLINICAL DATA: Altered level of consciousness, abdominal pain

EXAM:
CT HEAD WITHOUT CONTRAST
TECHNIQUE: Contiguous axial images were obtained from the base of the skull
through the vertex without intravenous contrast.

[Series 2: head wo · axial · 0.41mm/px · z∈[-182,-62]mm · 9 of 29 slices shown, 12 images]
[im 3/29  brain]
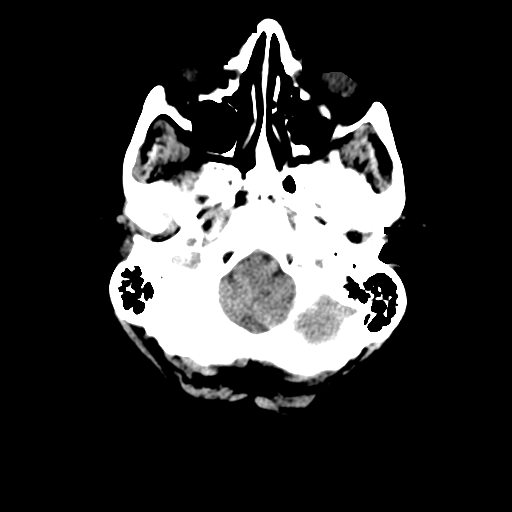
[im 3/29  bone]
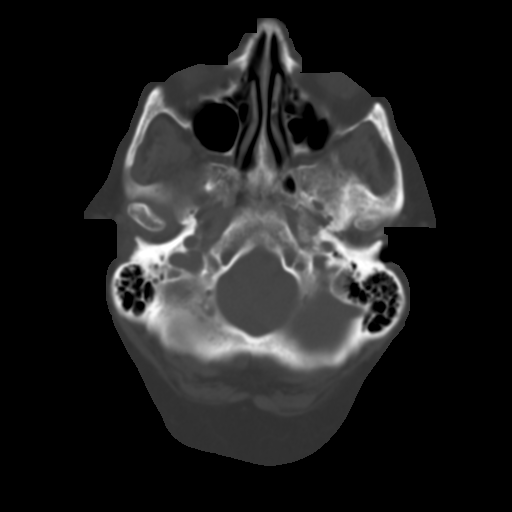
[im 6/29  brain]
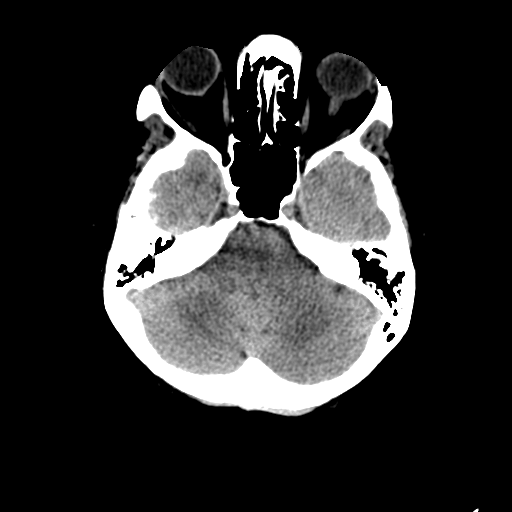
[im 9/29  brain]
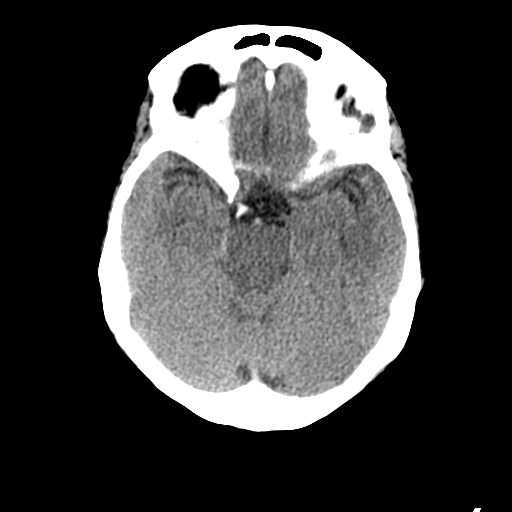
[im 12/29  brain]
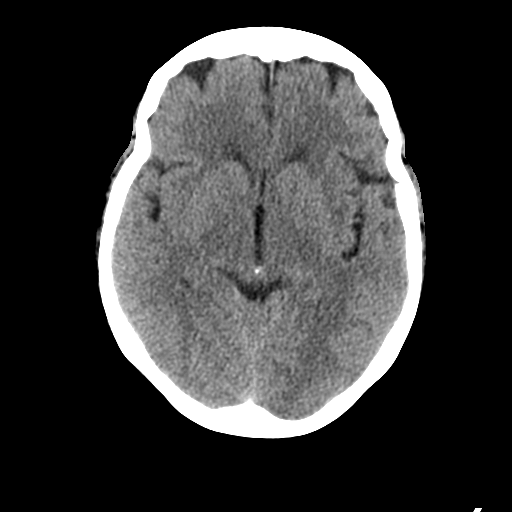
[im 15/29  brain]
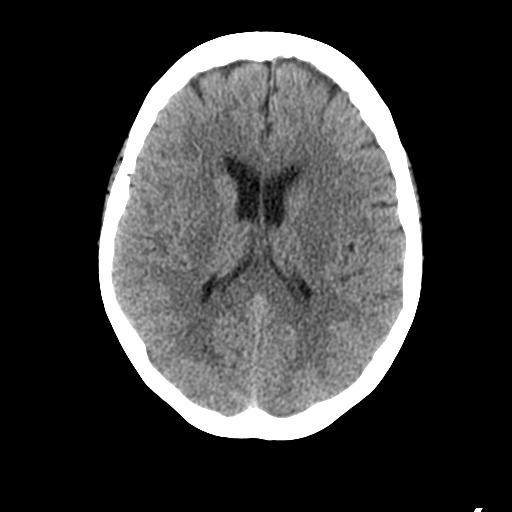
[im 15/29  bone]
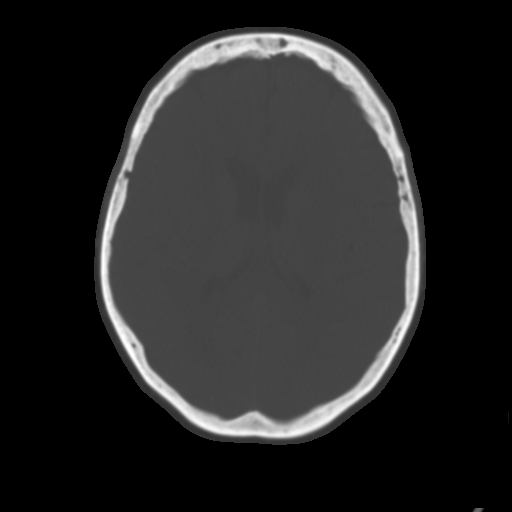
[im 18/29  brain]
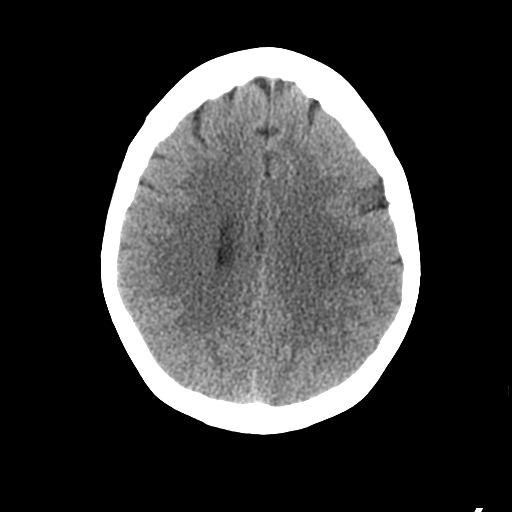
[im 21/29  brain]
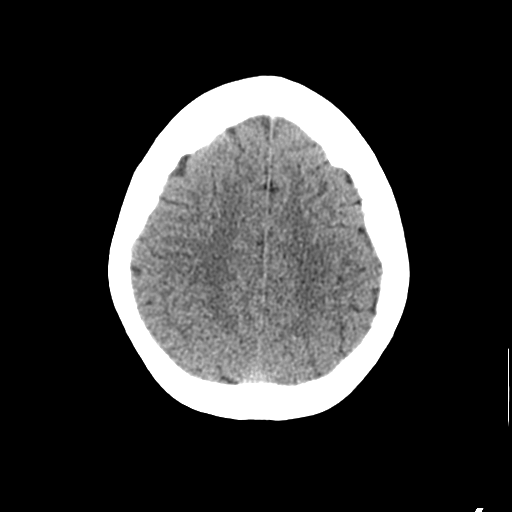
[im 24/29  brain]
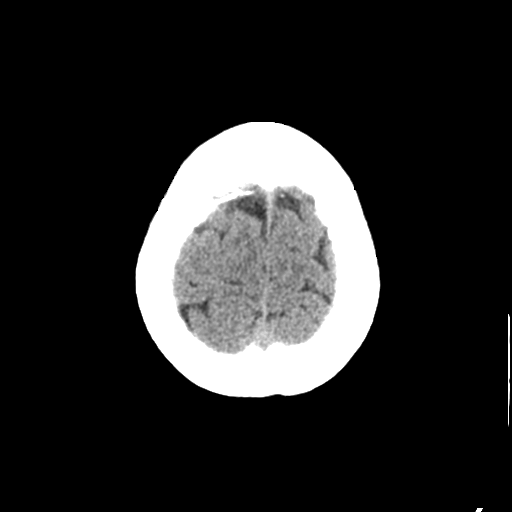
[im 27/29  brain]
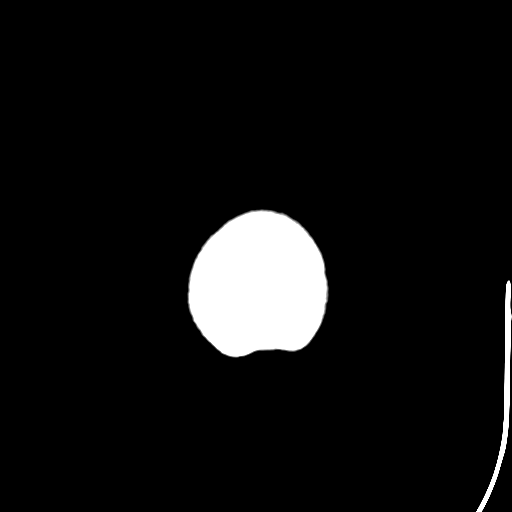
[im 27/29  bone]
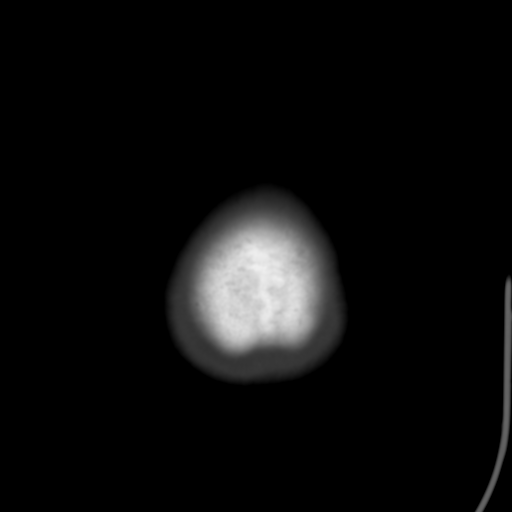

[Series 4: coronal soft tissue · coronal · 0.28mm/px · 3 of 58 slices shown]
[im 20/58  brain]
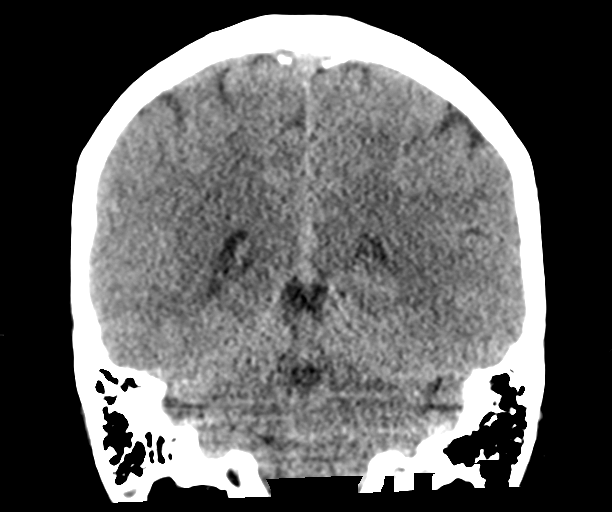
[im 26/58  brain]
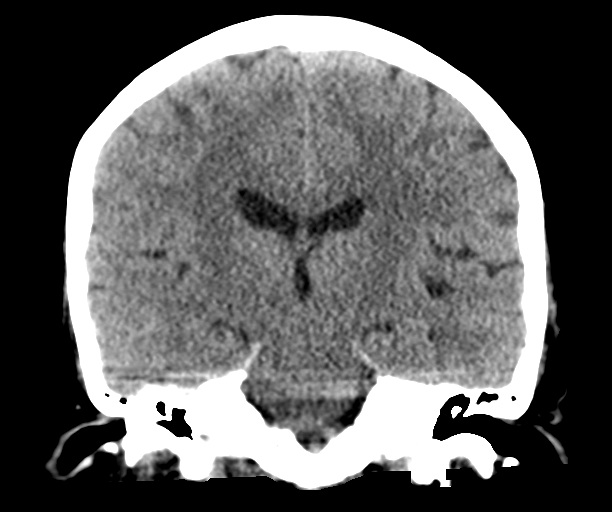
[im 32/58  brain]
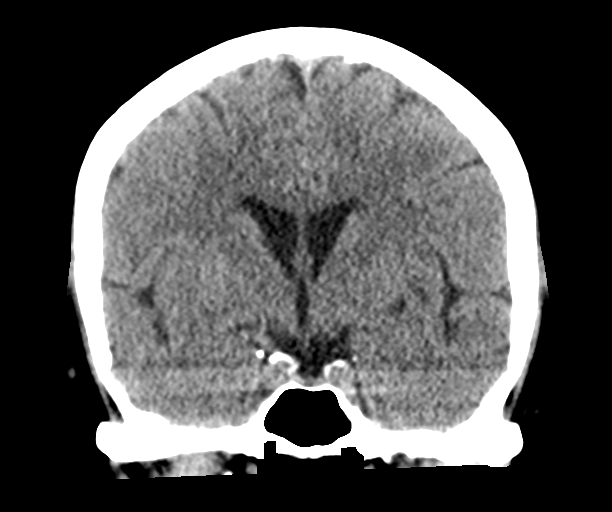

[Series 5: sagittal soft tissue · sagittal · 0.27mm/px · 3 of 48 slices shown]
[im 16/48  brain]
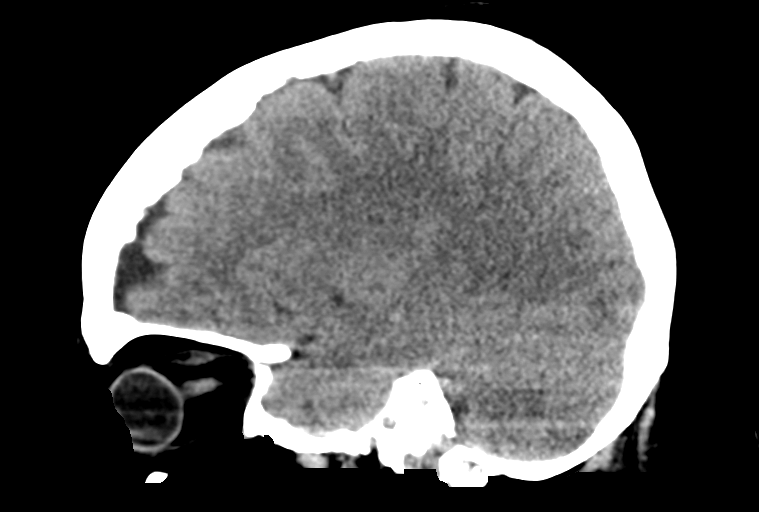
[im 24/48  brain]
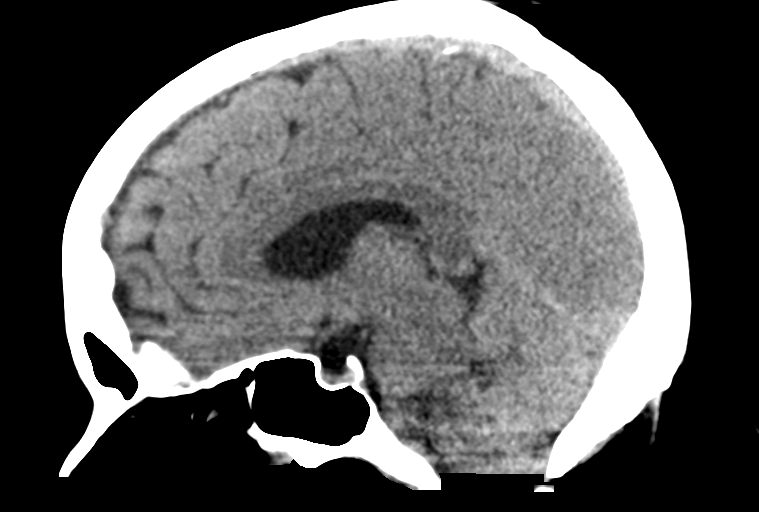
[im 32/48  brain]
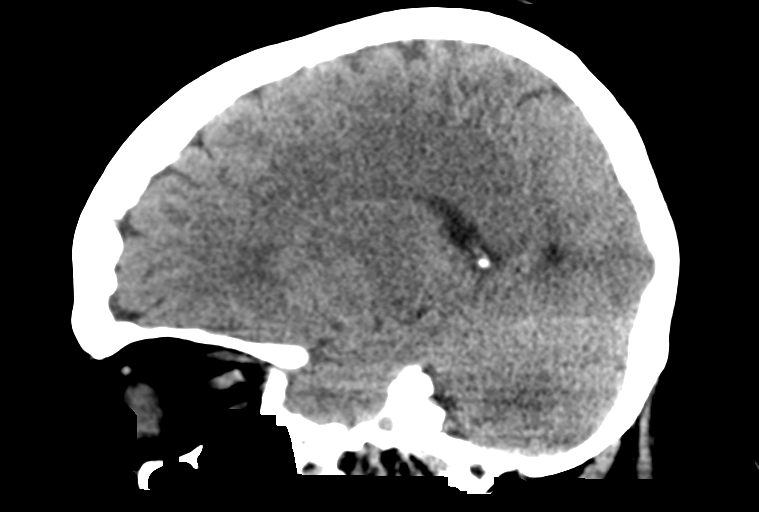

[15 of 46 positions shown; findings below may reference images not displayed]

FINDINGS: Brain: No acute infarct or hemorrhage. Lateral ventricles and
midline structures are unremarkable. Chronic lacunar infarct versus
dilated perivascular space left basal ganglia. No acute extra-axial
fluid collections. No mass effect.

Vascular: No hyperdense vessel or unexpected calcification.

Skull: Normal. Negative for fracture or focal lesion.

Sinuses/Orbits: No acute finding.

Other: None.
IMPRESSION: 1. No acute intracranial process.

## 2020-09-04 IMAGING — CR DG CHEST 2V
1 series · 3 of 3 positions shown · non-contrast
Comparison: Radiograph [DATE], CT [DATE]

CLINICAL DATA: Sepsis, shortness of breath for several weeks

EXAM:
CHEST - 2 VIEW

[Series 1: dg chest 2 view · 0.14mm/px · 3 of 3 slices shown]
[im 1/3]
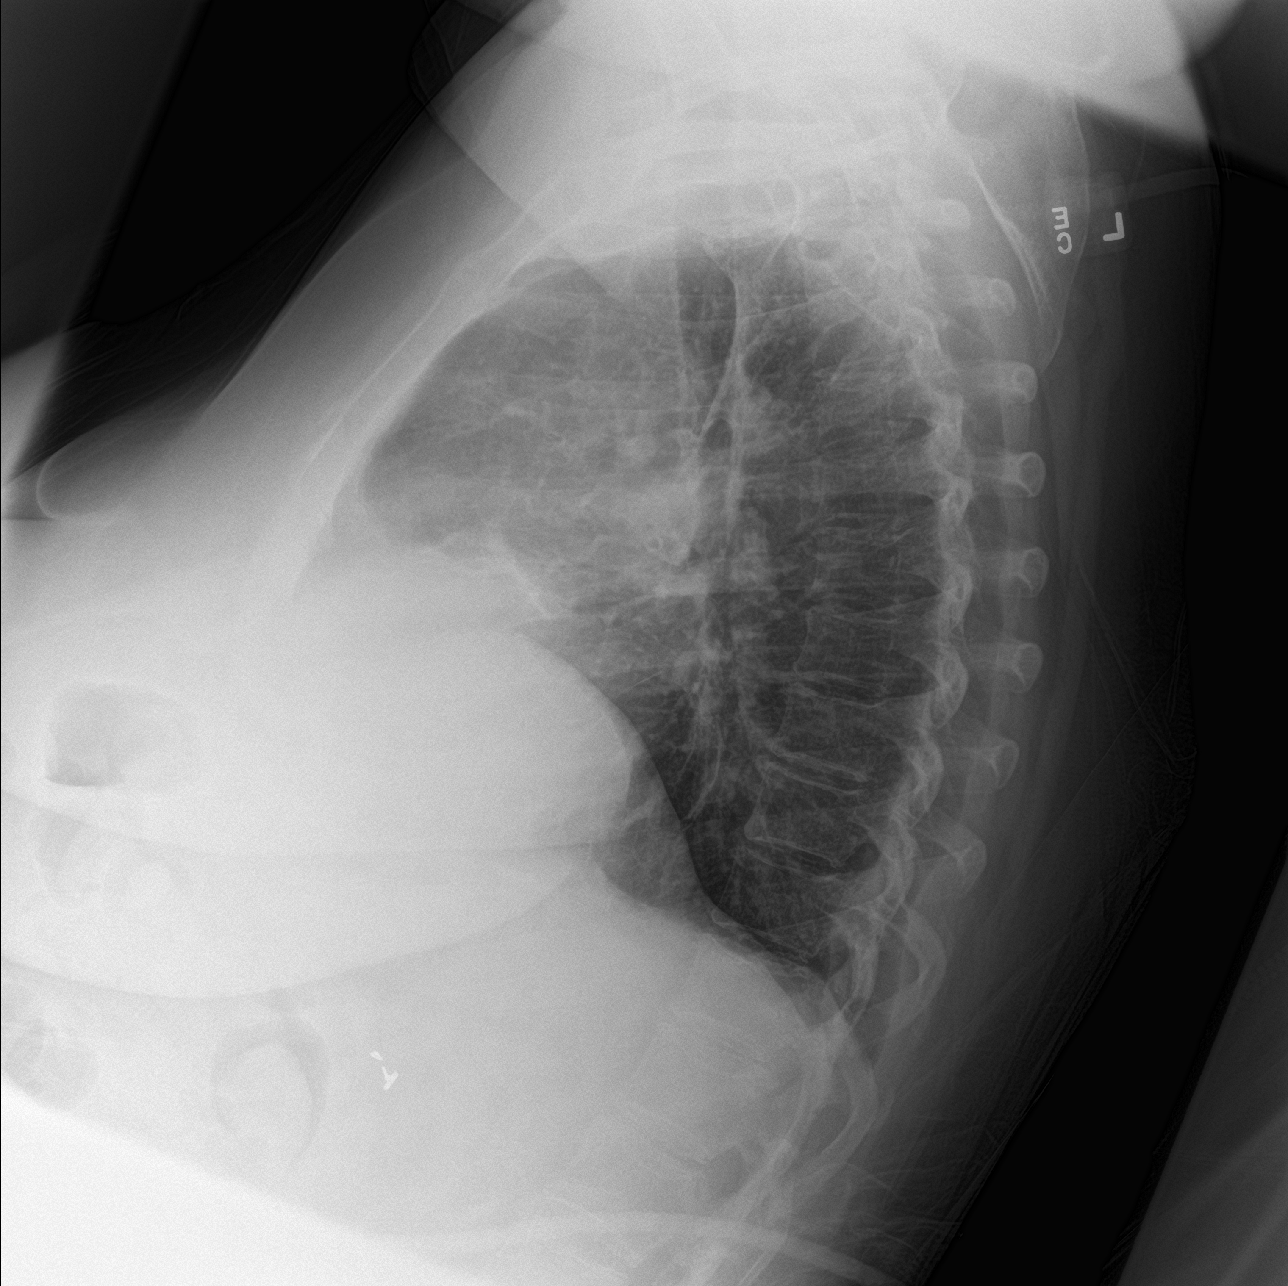
[im 2/3]
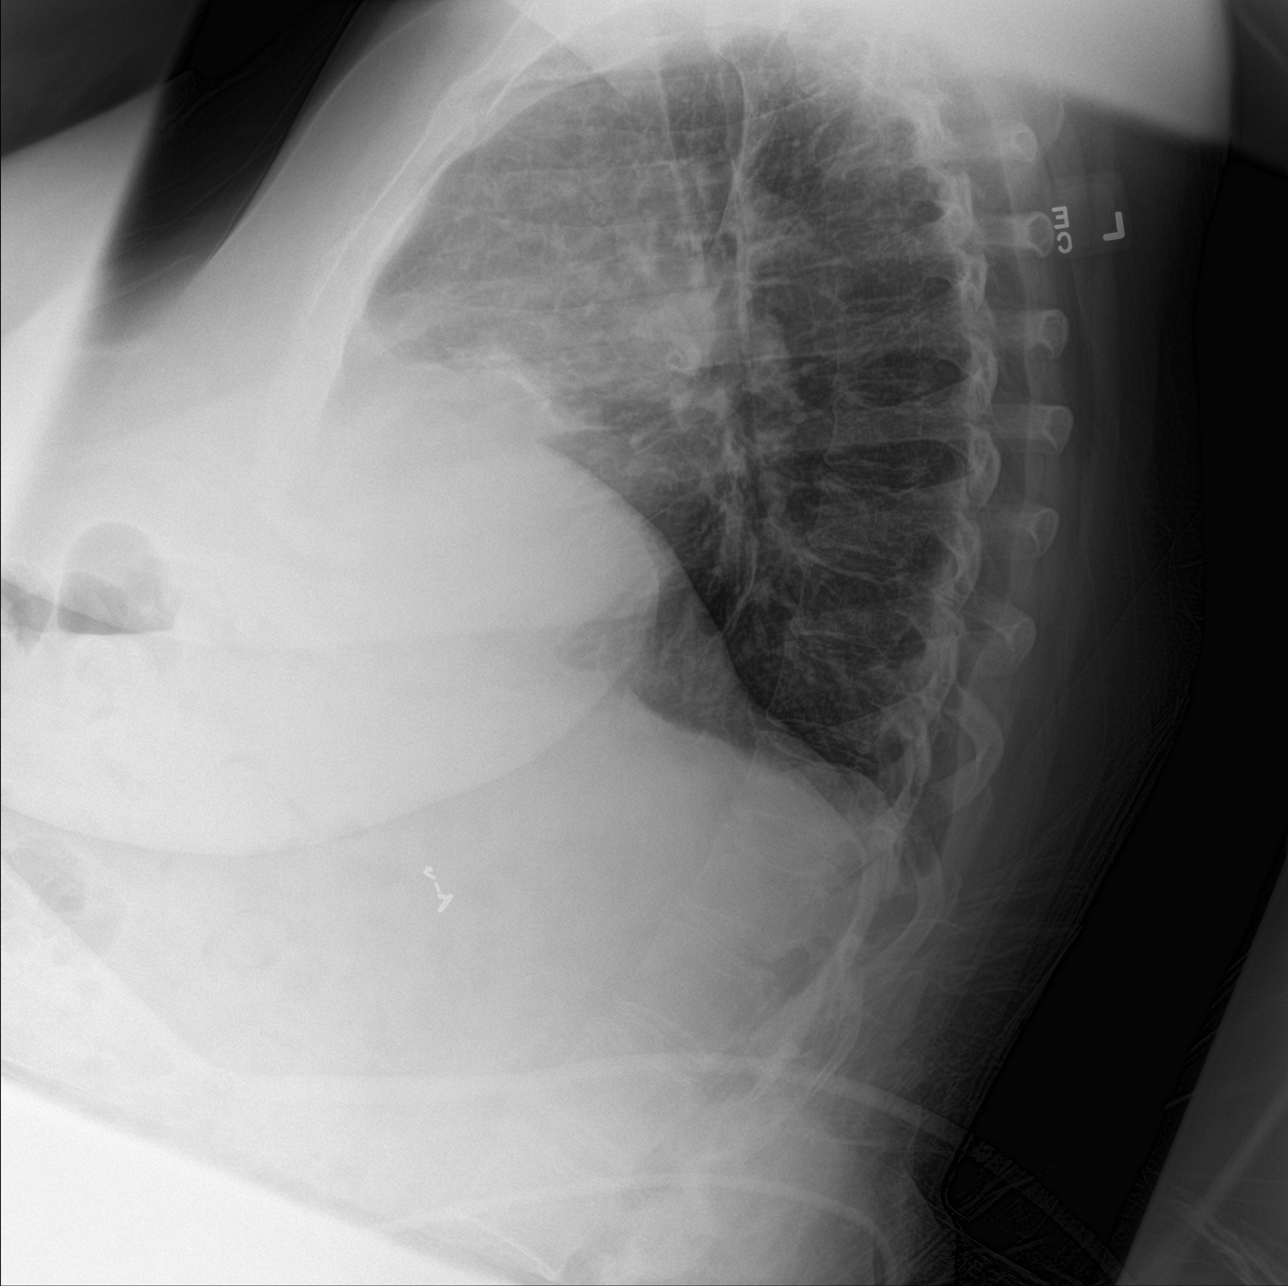
[im 3/3]
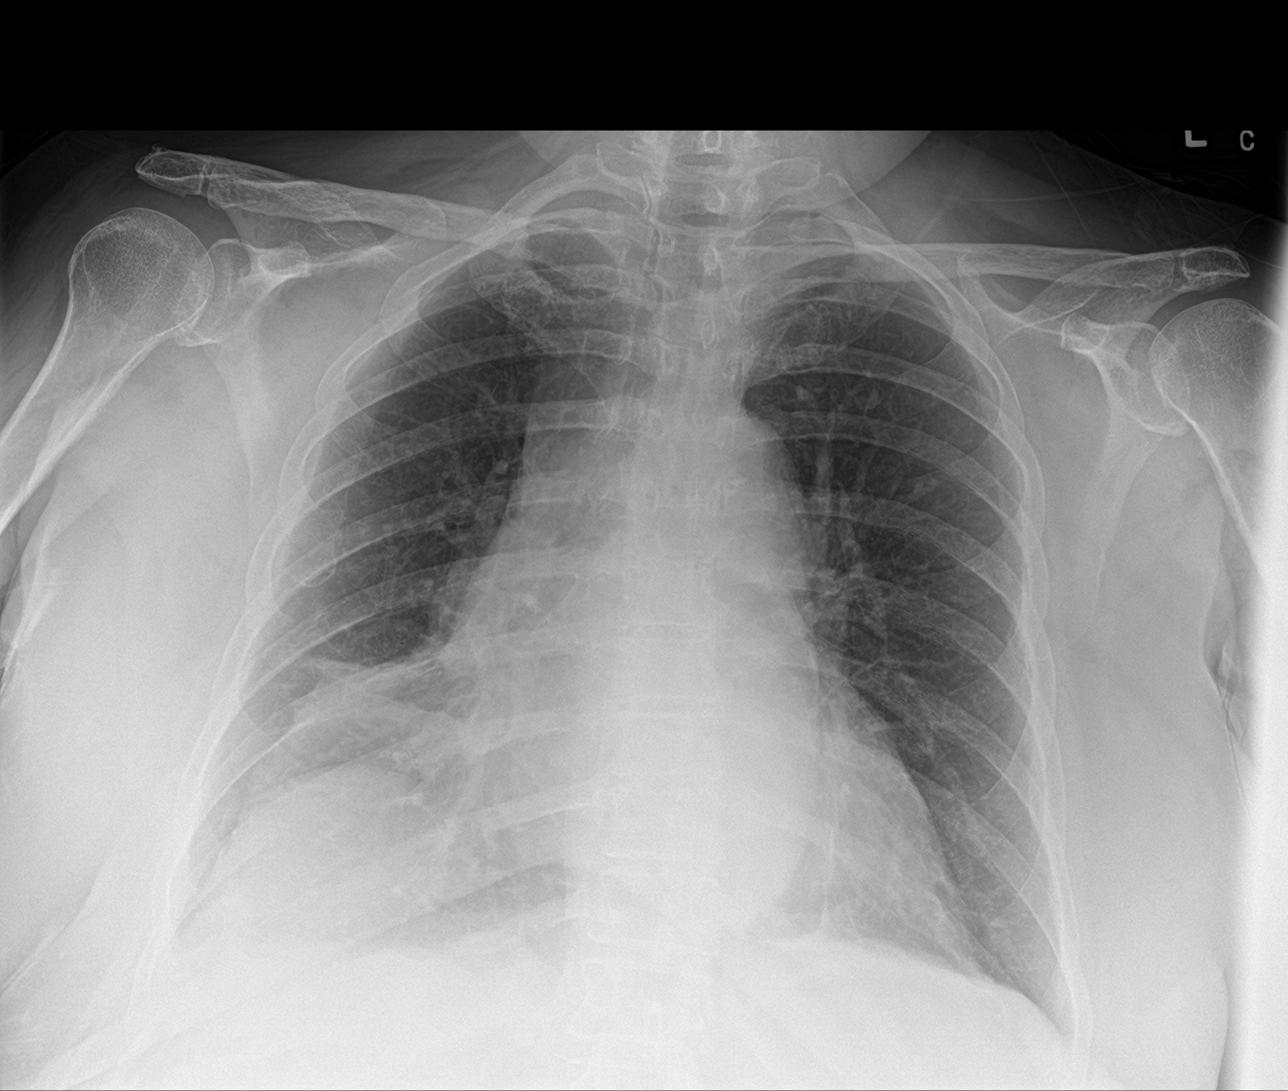

[3 of 3 positions shown; findings below may reference images not displayed]

FINDINGS: Chronic elevation of the right hemidiaphragm. Some reticular and
bandlike opacities in the right lung base likely reflect areas of
chronic scarring seen on comparison studies. No new focal
consolidative opacity is clearly evident. No pneumothorax or visible
effusion. No convincing features of edema. Stable cardiomediastinal
contours accounting for differences in technique. No acute osseous
or soft tissue abnormality. Degenerative changes are present in the
imaged spine and shoulders. Surgical clips in the right upper
quadrant.
IMPRESSION: Chronic elevation of the right hemidiaphragm with adjacent scarring.

No acute cardiopulmonary abnormality.

## 2020-09-04 MED ORDER — ALBUTEROL SULFATE HFA 108 (90 BASE) MCG/ACT IN AERS
1.0000 | INHALATION_SPRAY | Freq: Four times a day (QID) | RESPIRATORY_TRACT | Status: DC | PRN
  Filled 2020-09-04: qty 6.7

## 2020-09-04 MED ORDER — HEPARIN SODIUM (PORCINE) 5000 UNIT/ML IJ SOLN
5000.0000 [IU] | Freq: Three times a day (TID) | INTRAMUSCULAR | Status: DC
  Administered 2020-09-05 – 2020-09-07 (×8): 5000 [IU] via SUBCUTANEOUS
  Filled 2020-09-04 (×8): qty 1

## 2020-09-04 MED ORDER — LACTATED RINGERS IV BOLUS (SEPSIS)
1000.0000 mL | Freq: Once | INTRAVENOUS | Status: AC
  Administered 2020-09-04: 1000 mL via INTRAVENOUS

## 2020-09-04 MED ORDER — LACTATED RINGERS IV BOLUS
1000.0000 mL | Freq: Once | INTRAVENOUS | Status: AC
  Administered 2020-09-04: 1000 mL via INTRAVENOUS

## 2020-09-04 MED ORDER — INSULIN ASPART 100 UNIT/ML ~~LOC~~ SOLN
0.0000 [IU] | Freq: Three times a day (TID) | SUBCUTANEOUS | Status: DC
  Administered 2020-09-05: 1 [IU] via SUBCUTANEOUS
  Filled 2020-09-04: qty 1

## 2020-09-04 MED ORDER — MORPHINE SULFATE (PF) 4 MG/ML IV SOLN
4.0000 mg | Freq: Once | INTRAVENOUS | Status: AC
  Administered 2020-09-04: 4 mg via INTRAVENOUS
  Filled 2020-09-04: qty 1

## 2020-09-04 MED ORDER — ONDANSETRON HCL 4 MG PO TABS: 4.0000 mg | ORAL_TABLET | Freq: Four times a day (QID) | ORAL | Status: DC | PRN

## 2020-09-04 MED ORDER — ACETAMINOPHEN 650 MG RE SUPP: 650.0000 mg | Freq: Four times a day (QID) | RECTAL | Status: DC | PRN

## 2020-09-04 MED ORDER — LACTATED RINGERS IV BOLUS (SEPSIS)
1000.0000 mL | Freq: Once | INTRAVENOUS | Status: AC
  Administered 2020-09-05: 1000 mL via INTRAVENOUS

## 2020-09-04 MED ORDER — SODIUM CHLORIDE 0.9 % IV SOLN
1.0000 g | INTRAVENOUS | Status: DC
  Administered 2020-09-05 (×2): 1 g via INTRAVENOUS
  Filled 2020-09-04 (×3): qty 10

## 2020-09-04 MED ORDER — SODIUM CHLORIDE 0.9 % IV SOLN: 2.0000 g | Freq: Once | INTRAVENOUS | Status: DC

## 2020-09-04 MED ORDER — PRAVASTATIN SODIUM 20 MG PO TABS
80.0000 mg | ORAL_TABLET | Freq: Every day | ORAL | Status: DC
  Administered 2020-09-05 – 2020-09-06 (×2): 80 mg via ORAL
  Filled 2020-09-04: qty 2
  Filled 2020-09-04 (×2): qty 4

## 2020-09-04 MED ORDER — SODIUM CHLORIDE 0.9% FLUSH
3.0000 mL | Freq: Two times a day (BID) | INTRAVENOUS | Status: DC
  Administered 2020-09-05 – 2020-09-07 (×2): 3 mL via INTRAVENOUS

## 2020-09-04 MED ORDER — ACETAMINOPHEN 325 MG PO TABS: 650.0000 mg | ORAL_TABLET | Freq: Four times a day (QID) | ORAL | Status: DC | PRN

## 2020-09-04 MED ORDER — ONDANSETRON HCL 4 MG/2ML IJ SOLN: 4.0000 mg | Freq: Four times a day (QID) | INTRAMUSCULAR | Status: DC | PRN

## 2020-09-04 NOTE — ED Triage Notes (Signed)
Pt here with abd pain that started 2 days ago. Pt denies N, v, d. NAD in triage.

## 2020-09-04 NOTE — ED Provider Notes (Signed)
[facility] Emergency Department Provider Note ____________________________________________   First MD Initiated Contact with Patient 09/04/20 1943     (approximate)  I have reviewed the triage vital signs and the nursing notes.  HISTORY  Chief Complaint Abdominal Pain   HPI Sara Mcdonald is a 74 y.o. femalewho presents to the ED for evaluation of abdominal pain.   Chart review indicates hx DM , anxiety and depression, HTN and HLD.  Patient lives at home with her daughter, handles her own ADLs and is ambulatory without assistance device at baseline.  Patient presents to the ED with her daughter due to 2-3 days of left lower quadrant abdominal pain.  Patient reports up to 8/10 intensity LLQ pain that waxes and wanes, but is always present over the past couple days.  Nonradiating.  She denies taking any medications to assist with his pain.  She denies any vomiting, diarrhea, nausea, dysuria, vaginal bleeding or discharge, syncope, chest pain, shortness of breath or cough.  She has no complaints other than her pain.  Daughter further reports that she found the patient "talking out of her head" this afternoon, which precipitated her bringing patient into the ED today.  Daughter reports that patient looks better now and is more cogent after receiving fluids.     Past Medical History:  Diagnosis Date  . Anxiety   . Depression   . Diabetes mellitus without complication ([place])   . Elevated lipids   . Fibromyalgia   . Headache   . Hypertension   . OA (osteoarthritis)   . Sleep apnea     Patient Active Problem List   Diagnosis Date Noted  . Sepsis ([place]) 03/17/2017  . Community acquired pneumonia 03/17/2017  . COPD with acute exacerbation ([place]) 03/17/2017  . Hypoxia 03/17/2017  . Hyponatremia 03/17/2017  . Acute on chronic renal insufficiency 03/17/2017  . Pneumonia 03/17/2017  . Acute respiratory failure with hypoxia ([place]) 02/01/2017    Past  Surgical History:  Procedure Laterality Date  . ABDOMINAL HYSTERECTOMY    . APPENDECTOMY    . CHOLECYSTECTOMY    . COLONOSCOPY    . COLONOSCOPY WITH PROPOFOL N/A 07/07/2016   Procedure: COLONOSCOPY WITH PROPOFOL;  Surgeon: Manya Silvas, MD;  Location: [facility] ENDOSCOPY;  Service: Endoscopy;  Laterality: N/A;  . ESOPHAGOGASTRODUODENOSCOPY    . FLEXIBLE SIGMOIDOSCOPY    . nanoflex Bilateral     Prior to Admission medications   Medication Sig Start Date End Date Taking? Authorizing Provider  acetaminophen-codeine (TYLENOL #3) 300-30 MG tablet Take by mouth every 4 (four) hours as needed for moderate pain.    [provider]  ALPRAZolam Duanne Moron) 0.5 MG tablet Take 0.5 mg by mouth 2 (two) times daily as needed for sleep. 01/11/17   [provider]  buPROPion (WELLBUTRIN SR) 150 MG 12 hr tablet Take 150 mg by mouth daily.     [provider]  docusate sodium (COLACE) 100 MG capsule Take 1 capsule (100 mg total) by mouth daily as needed. 12/17/19 12/16/20  Earleen Newport, MD  DULoxetine (CYMBALTA) 60 MG capsule Take 60 mg by mouth daily.    [provider]  HYDROcodone-acetaminophen (NORCO/VICODIN) 5-325 MG tablet Take 1 tablet by mouth every 6 (six) hours as needed for moderate pain. 12/17/19   Earleen Newport, MD  lisinopril (PRINIVIL,ZESTRIL) 10 MG tablet Take 10 mg by mouth daily.    [provider]  metFORMIN (GLUCOPHAGE) 500 MG tablet Take 500 mg by mouth  daily.     [provider]  Multiple Vitamin (MULTIVITAMIN) capsule Take 1 capsule by mouth daily.    [provider]  nystatin-triamcinolone (MYCOLOG II) cream Apply 1 application topically 2 (two) times daily.    [provider]  pantoprazole (PROTONIX) 40 MG tablet Take 40 mg by mouth 2 (two) times daily.     [provider]  polyethylene glycol powder (GLYCOLAX/MIRALAX) powder Take 17 g by mouth every other day. 02/15/17   [provider]    pravastatin (PRAVACHOL) 80 MG tablet Take 80 mg by mouth daily.    [provider]  promethazine (PHENERGAN) 25 MG suppository Place 25 mg rectally every 6 (six) hours as needed for nausea or vomiting.    [provider]  rizatriptan (MAXALT) 10 MG tablet Take 10 mg by mouth every 2 (two) hours as needed. 12/30/16   [provider]  topiramate (TOPAMAX) 25 MG tablet Take 25 mg by mouth daily. 03/14/17   [provider]  traZODone (DESYREL) 100 MG tablet Take 100 mg by mouth at bedtime.    [provider]  zolpidem (AMBIEN) 10 MG tablet Take 10 mg by mouth at bedtime as needed for sleep.    [provider]    Allergies Penicillins, Sulfur, and Tricor [fenofibrate]  Family History  Problem Relation Age of Onset  . Breast cancer Paternal Aunt 6  . CVA Mother   . Hypertension Mother   . COPD Father   . CAD Father   . Diabetes Father     Social History Social History   Tobacco Use  . Smoking status: Never Smoker  . Smokeless tobacco: Never Used  Substance Use Topics  . Alcohol use: No  . Drug use: No    Review of Systems  Constitutional: No fever/chills Eyes: No visual changes. ENT: No sore throat. Cardiovascular: Denies chest pain. Respiratory: Denies shortness of breath. Gastrointestinal: No nausea, no vomiting.  No diarrhea.  No constipation. Positive for abdominal pain. Genitourinary: Negative for dysuria. Musculoskeletal: Negative for back pain. Skin: Negative for rash. Neurological: Negative for headaches, focal weakness or numbness.   ____________________________________________   PHYSICAL EXAM:  VITAL SIGNS: Vitals:   09/04/20 1436 09/04/20 1910  BP: 102/61 91/63  Pulse: 86 (!) 114  Resp: 18 (!) 21  Temp: 98.1 F (36.7 C) 97.6 F (36.4 C)  SpO2: 93% 94%      Constitutional: Alert and oriented. Well appearing and in no acute distress. Eyes: Conjunctivae are normal. PERRL. EOMI. Head:  Atraumatic. Nose: No congestion/rhinnorhea. Mouth/Throat: Mucous membranes are moist.  Oropharynx non-erythematous. Neck: No stridor. No cervical spine tenderness to palpation. Cardiovascular: Normal rate, regular rhythm. Grossly normal heart sounds.  Good peripheral circulation. Respiratory: Normal respiratory effort.  No retractions. Lungs CTAB. Gastrointestinal: Soft , nondistended. No abdominal bruits. No CVA tenderness. LLQ and suprapubic tenderness to palpation without peritoneal features.  Abdomen otherwise benign.  No CVA tenderness bilaterally. Musculoskeletal: No lower extremity tenderness nor edema.  No joint effusions. No signs of acute trauma. Neurologic:  Normal speech and language. No gross focal neurologic deficits are appreciated. No gait instability noted. Skin:  Skin is warm, dry and intact. No rash noted. Psychiatric: Mood and affect are normal. Speech and behavior are normal.  ____________________________________________   LABS (all labs ordered are listed, but only abnormal results are displayed)  Labs Reviewed  COMPREHENSIVE METABOLIC PANEL - Abnormal; Notable for the following components:      Result Value   Glucose,  Bld 129 (*)    BUN 45 (*)    Creatinine, Ser 3.25 (*)    Calcium 8.8 (*)    AST 61 (*)    GFR calc non Af Amer 13 (*)    GFR calc Af Amer 15 (*)    All other components within normal limits  CBC - Abnormal; Notable for the following components:   WBC 11.3 (*)    All other components within normal limits  URINALYSIS, COMPLETE (UACMP) WITH MICROSCOPIC - Abnormal; Notable for the following components:   Color, Urine AMBER (*)    APPearance CLOUDY (*)    Protein, ur 30 (*)    Leukocytes,Ua MODERATE (*)    WBC, UA >50 (*)    Bacteria, UA RARE (*)    All other components within normal limits  URINE CULTURE  LIPASE, BLOOD  LACTIC ACID, PLASMA   ____________________________________________  RADIOLOGY  ED MD interpretation:    Official  radiology report(s): CT ABDOMEN PELVIS WO CONTRAST  Result Date: 09/04/2020 CLINICAL DATA:  Left lower quadrant pain. EXAM: CT ABDOMEN AND PELVIS WITHOUT CONTRAST TECHNIQUE: Multidetector CT imaging of the abdomen and pelvis was performed following the standard protocol without IV contrast. COMPARISON:  None. FINDINGS: Lower chest: No acute abnormality. Hepatobiliary: No focal liver abnormality is seen. Status post cholecystectomy. No biliary dilatation. Pancreas: Unremarkable. No pancreatic ductal dilatation or surrounding inflammatory changes. Spleen: Normal in size without focal abnormality. Adrenals/Urinary Tract: Adrenal glands are unremarkable. Kidneys are normal, without renal calculi or focal lesions. Stable prominence of a right extrarenal pelvis is seen. Bladder is unremarkable. Stomach/Bowel: Stomach is within normal limits. The appendix is not identified. No evidence of bowel dilatation. Noninflamed diverticula are seen throughout the large bowel. Vascular/Lymphatic: There is mild calcification of the abdominal aorta and bilateral common iliac arteries, without evidence of aneurysmal dilatation. No enlarged abdominal or pelvic lymph nodes. Reproductive: Status post hysterectomy. No adnexal masses. Other: No abdominal wall hernia or abnormality. No abdominopelvic ascites. Musculoskeletal: No acute or significant osseous findings. IMPRESSION: 1. Noninflamed diverticula throughout the large bowel. 2. Evidence of prior cholecystectomy and hysterectomy. 3. Aortic atherosclerosis. Aortic Atherosclerosis (ICD10-I70.0). Electronically Signed   By: Virgina Norfolk M.D.   On: 09/04/2020 20:36    ____________________________________________   PROCEDURES and INTERVENTIONS  Procedure(s) performed (including Critical Care):  Procedures  Medications  ceFEPIme (MAXIPIME) 2 g in sodium chloride 0.9 % 100 mL IVPB (has no administration in time range)  lactated ringers bolus 1,000 mL (1,000 mLs  Intravenous New Bag/Given 09/04/20 2021)  morphine 4 MG/ML injection 4 mg (4 mg Intravenous Given 09/04/20 2022)    ____________________________________________   MDM / ED COURSE  74 year old woman presents from home with 3 days of LLQ abdominal pain, most consistent with a complicated UTI and requiring medical admission.  Patient tachycardic but hemodynamically stable.  Exam demonstrating isolated LLQ tenderness without peritoneal features and a well-appearing patient.  She otherwise has no evidence of acute pathology on exam.  Abdomen is otherwise benign.  No CVA tenderness.  She is in no distress and has no signs of trauma.  Blood work with mild leukocytosis and with AKI that appears to be prerenal in etiology.  She received 1 L of LR for this.  Urine with infectious features.  CT imaging obtained due to her history of diverticula, to evaluate for diverticulitis, and without evidence of such.  She has no evidence of additional acute pathology beyond her acute cystitis.  In combination with her AKI and  confusion at home, per reports from the daughter, I am concerned about a complicated UTI requiring IV antibiotics and medical admission to ensure her improvement clinically.  Admitted to hospitalist medicine for further work-up and management.      ____________________________________________   FINAL CLINICAL IMPRESSION(S) / ED DIAGNOSES  Final diagnoses:  Complicated UTI (urinary tract infection)  AKI (acute kidney injury) ([place])  LLQ abdominal pain  Sinus tachycardia     ED Discharge Orders    None       Kiira Brach   Note:  This document was prepared using Systems analyst and may include unintentional dictation errors.   Vladimir Crofts, MD 09/04/20 [phone]

## 2020-09-04 NOTE — ED Notes (Signed)
MD to bedside with patient and family.

## 2020-09-04 NOTE — ED Notes (Signed)
Daughter to stay with patient as she meets ALONE criteria. Given urine specimen cup to collect urine sample.

## 2020-09-04 NOTE — H&P (Signed)
History and Physical    Sara Mcdonald UVO:[id number] DOB: 27-Jul-1946 DOA: 09/04/2020  PCP: [name], MD  Patient coming from: Home via EMS  I have personally briefly reviewed patient's old medical records in [place]  Chief Complaint: Abdominal pain  HPI: Sara Mcdonald is a 74 y.o. female with medical history significant for T2DM, CKD stage III, moderate persistent asthma, HTN, HLD, depression/anxiety, and OSA who presents to the ED for evaluation of abdominal pain.  Patient reports new onset of nonradiating left lower quadrant abdominal pain beginning 2 days ago.  She has had associated nausea without emesis, subjective fevers, diaphoresis, dysuria, suprapubic discomfort, increased urinary frequency, and intermittent loose stools.  She also reports cough productive of yellow sputum and shortness of breath.  She has had increased confusion today which was a significant change from her baseline. Per daughter, patient is completely alert, oriented, and functional at baseline.  ED Course:  Initial vitals showed BP 109/55, pulse 85, RR 18, temp 98.7 Fahrenheit, SPO2 93% on room air.  While in the ED patient became tachycardiac with pulse up to 114 and tachypneic with RR up to 21.  Labs notable for BUN 45, creatinine 3.25 (last creatinine 1.5 on 05/06/2020), sodium 137, potassium 4.3, bicarb 25, serum glucose 129, AST 61, ALT 22, alk phos 66, total bilirubin 0.8, WBC 11.3, hemoglobin 13.2, platelets 253,000, lipase 25.  Urinalysis shows negative nitrites, moderate leukocytes, 6-10 RBC/hpf, >50 WBC/hpf, rare bacteria microscopy.  Urine culture was obtained and pending.  CT abdomen/pelvis without contrast showed noninflamed diverticula throughout the large bowel, evidence of prior cholecystectomy and hysterectomy, and aortic atherosclerosis.  Patient was given 1 L LR, IV morphine 4 mg, and IV cefepime.  The hospitalist service was consulted to admit for further evaluation and  management.  Review of Systems: All systems reviewed and are negative except as documented in history of present illness above.   Past Medical History:  Diagnosis Date  . Anxiety   . Depression   . Diabetes mellitus without complication ([place])   . Elevated lipids   . Fibromyalgia   . Headache   . Hypertension   . OA (osteoarthritis)   . Sleep apnea     Past Surgical History:  Procedure Laterality Date  . ABDOMINAL HYSTERECTOMY    . APPENDECTOMY    . CHOLECYSTECTOMY    . COLONOSCOPY    . COLONOSCOPY WITH PROPOFOL N/A 07/07/2016   Procedure: COLONOSCOPY WITH PROPOFOL;  Surgeon: Manya Silvas, MD;  Location: [facility] ENDOSCOPY;  Service: Endoscopy;  Laterality: N/A;  . ESOPHAGOGASTRODUODENOSCOPY    . FLEXIBLE SIGMOIDOSCOPY    . nanoflex Bilateral     Social History:  reports that she has never smoked. She has never used smokeless tobacco. She reports that she does not drink alcohol and does not use drugs.  Allergies  Allergen Reactions  . Penicillins   . Sulfur   . Tricor [Fenofibrate]     Family History  Problem Relation Age of Onset  . Breast cancer Paternal Aunt 23  . CVA Mother   . Hypertension Mother   . COPD Father   . CAD Father   . Diabetes Father      Prior to Admission medications   Medication Sig Start Date End Date Taking? Authorizing Provider  acetaminophen-codeine (TYLENOL #3) 300-30 MG tablet Take by mouth every 4 (four) hours as needed for moderate pain.    [provider]  ALPRAZolam Duanne Moron) 0.5 MG tablet Take 0.5  mg by mouth 2 (two) times daily as needed for sleep. 01/11/17   [provider]  buPROPion (WELLBUTRIN SR) 150 MG 12 hr tablet Take 150 mg by mouth daily.     [provider]  docusate sodium (COLACE) 100 MG capsule Take 1 capsule (100 mg total) by mouth daily as needed. 12/17/19 12/16/20  Earleen Newport, MD  DULoxetine (CYMBALTA) 60 MG capsule Take 60 mg by mouth daily.    [provider]    HYDROcodone-acetaminophen (NORCO/VICODIN) 5-325 MG tablet Take 1 tablet by mouth every 6 (six) hours as needed for moderate pain. 12/17/19   Earleen Newport, MD  lisinopril (PRINIVIL,ZESTRIL) 10 MG tablet Take 10 mg by mouth daily.    [provider]  metFORMIN (GLUCOPHAGE) 500 MG tablet Take 500 mg by mouth daily.     [provider]  Multiple Vitamin (MULTIVITAMIN) capsule Take 1 capsule by mouth daily.    [provider]  nystatin-triamcinolone (MYCOLOG II) cream Apply 1 application topically 2 (two) times daily.    [provider]  pantoprazole (PROTONIX) 40 MG tablet Take 40 mg by mouth 2 (two) times daily.     [provider]  polyethylene glycol powder (GLYCOLAX/MIRALAX) powder Take 17 g by mouth every other day. 02/15/17   [provider]  pravastatin (PRAVACHOL) 80 MG tablet Take 80 mg by mouth daily.    [provider]  promethazine (PHENERGAN) 25 MG suppository Place 25 mg rectally every 6 (six) hours as needed for nausea or vomiting.    [provider]  rizatriptan (MAXALT) 10 MG tablet Take 10 mg by mouth every 2 (two) hours as needed. 12/30/16   [provider]  topiramate (TOPAMAX) 25 MG tablet Take 25 mg by mouth daily. 03/14/17   [provider]  traZODone (DESYREL) 100 MG tablet Take 100 mg by mouth at bedtime.    [provider]  zolpidem (AMBIEN) 10 MG tablet Take 10 mg by mouth at bedtime as needed for sleep.    [provider]    Physical Exam: Vitals:   09/04/20 1205 09/04/20 1436 09/04/20 1910 09/04/20 2100  BP: (!) 109/55 102/61 91/63 96/64   Pulse: 85 86 (!) 114 (!) 110  Resp: 18 18 (!) 21 16  Temp: 98.7 F (37.1 C) 98.1 F (36.7 C) 97.6 F (36.4 C)   TempSrc: Oral Oral Oral   SpO2: 93% 93% 94% 93%  Weight: 90.7 kg     Height: 4' 11"  (1.499 m)      Constitutional: Obese woman resting supine in bed, NAD, calm, comfortable Eyes: PERRL, lids and  conjunctivae normal ENMT: Mucous membranes are moist. Posterior pharynx clear of any exudate or lesions.Normal dentition.  Neck: normal, supple, no masses. Respiratory: Bibasilar inspiratory crackles.. Normal respiratory effort. No accessory muscle use.  Cardiovascular: Regular rate and rhythm, no murmurs / rubs / gallops. No extremity edema. 2+ pedal pulses. Abdomen: Generalized tenderness most pronounced in the suprapubic and LLQ regions, no masses palpated. No hepatosplenomegaly. Bowel sounds positive.  Musculoskeletal: no clubbing / cyanosis. No joint deformity upper and lower extremities. Good ROM, no contractures. Normal muscle tone.  Skin: no rashes, lesions, ulcers. No induration Neurologic: CN 2-12 grossly intact. Sensation intact,Strength 5/5 in all 4.  Psychiatric: Awake and alert but appears confused. Knows she is in the hospital, does not know the year.  Labs on Admission: I have personally reviewed following labs and imaging studies  CBC: Recent Labs  Lab 09/04/20 1208  WBC 11.3*  HGB 13.2  HCT 38.6  MCV 90.4  PLT 025   Basic Metabolic Panel: Recent Labs  Lab 09/04/20 1208  NA 137  K 4.3  CL 99  CO2 25  GLUCOSE 129*  BUN 45*  CREATININE 3.25*  CALCIUM 8.8*   GFR: Estimated Creatinine Clearance: 14.9 mL/min (A) (by C-G formula based on SCr of 3.25 mg/dL (H)). Liver Function Tests: Recent Labs  Lab 09/04/20 1208  AST 61*  ALT 22  ALKPHOS 66  BILITOT 0.8  PROT 7.0  ALBUMIN 3.7   Recent Labs  Lab 09/04/20 1208  LIPASE 25   No results for input(s): AMMONIA in the last 168 hours. Coagulation Profile: No results for input(s): INR, PROTIME in the last 168 hours. Cardiac Enzymes: No results for input(s): CKTOTAL, CKMB, CKMBINDEX, TROPONINI in the last 168 hours. BNP (last 3 results) No results for input(s): PROBNP in the last 8760 hours. HbA1C: No results for input(s): HGBA1C in the last 72 hours. CBG: No results for input(s): GLUCAP in the last  168 hours. Lipid Profile: No results for input(s): CHOL, HDL, LDLCALC, TRIG, CHOLHDL, LDLDIRECT in the last 72 hours. Thyroid Function Tests: No results for input(s): TSH, T4TOTAL, FREET4, T3FREE, THYROIDAB in the last 72 hours. Anemia Panel: No results for input(s): VITAMINB12, FOLATE, FERRITIN, TIBC, IRON, RETICCTPCT in the last 72 hours. Urine analysis:    Component Value Date/Time   COLORURINE AMBER (A) 09/04/2020 1725   APPEARANCEUR CLOUDY (A) 09/04/2020 1725   APPEARANCEUR Clear 08/11/2014 2012   LABSPEC 1.020 09/04/2020 1725   LABSPEC 1.011 08/11/2014 2012   PHURINE 5.0 09/04/2020 1725   GLUCOSEU NEGATIVE 09/04/2020 1725   GLUCOSEU Negative 08/11/2014 2012   HGBUR NEGATIVE 09/04/2020 [place] 09/04/2020 1725   BILIRUBINUR Negative 08/11/2014 2012   KETONESUR NEGATIVE 09/04/2020 1725   PROTEINUR 30 (A) 09/04/2020 1725   NITRITE NEGATIVE 09/04/2020 1725   LEUKOCYTESUR MODERATE (A) 09/04/2020 1725   LEUKOCYTESUR Negative 08/11/2014 2012    Radiological Exams on Admission: CT ABDOMEN PELVIS WO CONTRAST  Result Date: 09/04/2020 CLINICAL DATA:  Left lower quadrant pain. EXAM: CT ABDOMEN AND PELVIS WITHOUT CONTRAST TECHNIQUE: Multidetector CT imaging of the abdomen and pelvis was performed following the standard protocol without IV contrast. COMPARISON:  None. FINDINGS: Lower chest: No acute abnormality. Hepatobiliary: No focal liver abnormality is seen. Status post cholecystectomy. No biliary dilatation. Pancreas: Unremarkable. No pancreatic ductal dilatation or surrounding inflammatory changes. Spleen: Normal in size without focal abnormality. Adrenals/Urinary Tract: Adrenal glands are unremarkable. Kidneys are normal, without renal calculi or focal lesions. Stable prominence of a right extrarenal pelvis is seen. Bladder is unremarkable. Stomach/Bowel: Stomach is within normal limits. The appendix is not identified. No evidence of bowel dilatation. Noninflamed  diverticula are seen throughout the large bowel. Vascular/Lymphatic: There is mild calcification of the abdominal aorta and bilateral common iliac arteries, without evidence of aneurysmal dilatation. No enlarged abdominal or pelvic lymph nodes. Reproductive: Status post hysterectomy. No adnexal masses. Other: No abdominal wall hernia or abnormality. No abdominopelvic ascites. Musculoskeletal: No acute or significant osseous findings. IMPRESSION: 1. Noninflamed diverticula throughout the large bowel. 2. Evidence of prior cholecystectomy and hysterectomy. 3. Aortic atherosclerosis. Aortic Atherosclerosis (ICD10-I70.0). Electronically Signed   By: Virgina Norfolk M.D.   On: 09/04/2020 20:36    EKG: Ordered and pending.  Assessment/Plan Principal Problem:   Severe sepsis with acute organ dysfunction (HCC) Active Problems:   Diabetes mellitus without complication ([place])   Hypertension associated with  diabetes ([place])   Acute kidney injury superimposed on CKD ([place])   Acute lower UTI   Depression with anxiety   OSA (obstructive sleep apnea)   Hyperlipidemia associated with type 2 diabetes mellitus ([place])  Sara Mcdonald is a 75 y.o. female with medical history significant for T2DM, CKD stage III, moderate persistent asthma, HTN, HLD, depression/anxiety, and OSA who is admitted with severe sepsis with acute organ dysfunction due to complicated UTI.  Severe sepsis with acute organ dysfunction secondary to complicated UTI: Patient presenting with tachypnea with RR up to 21, tachycardia with pulse of up to 114 and urinary symptoms and urinalysis suggestive of UTI.  She has evidence of endorgan damage with AKI on CKD stage III. -Follow urine culture, obtain blood cultures -Start empiric IV ceftriaxone -Obtain screening SARS-CoV-2 PCR -Obtain 2 view chest x-ray -Start IV fluid resuscitation with LR per sepsis protocol  AKI on CKD stage III: Creatinine 3.25 on admission compared to last creatinine of  1.5 on 05/06/2020.  Suspect due to sepsis and UTI. -Obtain renal ultrasound -Check urine sodium and creatinine -Continue IV fluids as above and repeat labs in a.m. -Avoid NSAIDs, hold home lisinopril and Metformin  Acute metabolic encephalopathy: Suspect due to sepsis from UTI.  Will obtain CT head without contrast to rule out acute intracranial abnormality.  Otherwise continue management as above.  Moderate persistent asthma: With reported chronic cough and history of recurrent pleurisy.  No wheezing present on admission.  Continue albuterol as needed.  Type 2 diabetes: Hold home Metformin and place on sensitive SSI while in hospital.  Hypertension: Hold home lisinopril due to AKI and hypotension.  Continue IV fluid hydration as above.  Hyperlipidemia: Continue pravastatin.  Depression with anxiety: Holding home Wellbutrin and Cymbalta in setting of AKI.  Hold home Xanax due to acute encephalopathy, resume as able.  OSA: Has not used CPAP for several years, is planning for outpatient sleep study per daughter-in-law.  DVT prophylaxis: Subcutaneous heparin Code Status: Full code, confirmed with patient's daughter Family Communication: Discussed with patient's daughter by phone Disposition Plan: From home likely discharge to home pending further infectious work-up and management Consults called: None Admission status:  Status is: Inpatient  Remains inpatient appropriate because:Ongoing diagnostic testing needed not appropriate for outpatient work up, IV treatments appropriate due to intensity of illness or inability to take PO and Inpatient level of care appropriate due to severity of illness   Dispo: The patient is from: Home              Anticipated d/c is to: Home              Anticipated d/c date is: 3 days              Patient currently is not medically stable to d/c.    Zada Finders MD [facility]  If 7PM-7AM, please contact  night-coverage [email or web address]  09/04/2020, 9:57 PM

## 2020-09-04 NOTE — ED Notes (Signed)
20g IV inserted PTA by EMS. Patient complains of abdominal pain x 2 days. No bowel sounds reported by EMS but hollow over all quadrants. Patient thinks she is having a bout of diverticulitis.

## 2020-09-05 ENCOUNTER — Other Ambulatory Visit: Payer: Self-pay

## 2020-09-05 ENCOUNTER — Encounter: Payer: Self-pay | Admitting: Internal Medicine

## 2020-09-05 DIAGNOSIS — F419 Anxiety disorder, unspecified: Secondary | ICD-10-CM

## 2020-09-05 DIAGNOSIS — N189 Chronic kidney disease, unspecified: Secondary | ICD-10-CM

## 2020-09-05 DIAGNOSIS — E785 Hyperlipidemia, unspecified: Secondary | ICD-10-CM

## 2020-09-05 DIAGNOSIS — N179 Acute kidney failure, unspecified: Secondary | ICD-10-CM

## 2020-09-05 DIAGNOSIS — F329 Major depressive disorder, single episode, unspecified: Secondary | ICD-10-CM

## 2020-09-05 DIAGNOSIS — R531 Weakness: Secondary | ICD-10-CM

## 2020-09-05 DIAGNOSIS — N390 Urinary tract infection, site not specified: Secondary | ICD-10-CM

## 2020-09-05 DIAGNOSIS — J209 Acute bronchitis, unspecified: Secondary | ICD-10-CM

## 2020-09-05 DIAGNOSIS — E1169 Type 2 diabetes mellitus with other specified complication: Secondary | ICD-10-CM

## 2020-09-05 LAB — LACTIC ACID, PLASMA: Lactic Acid, Venous: 1 mmol/L (ref 0.5–1.9)

## 2020-09-05 LAB — CBC
HCT: 32.8 % — ABNORMAL LOW (ref 36.0–46.0)
Hemoglobin: 10.7 g/dL — ABNORMAL LOW (ref 12.0–15.0)
MCH: 29.9 pg (ref 26.0–34.0)
MCHC: 32.6 g/dL (ref 30.0–36.0)
MCV: 91.6 fL (ref 80.0–100.0)
Platelets: 181 10*3/uL (ref 150–400)
RBC: 3.58 MIL/uL — ABNORMAL LOW (ref 3.87–5.11)
RDW: 12.7 % (ref 11.5–15.5)
WBC: 8 10*3/uL (ref 4.0–10.5)
nRBC: 0 % (ref 0.0–0.2)

## 2020-09-05 LAB — GLUCOSE, CAPILLARY
Glucose-Capillary: 108 mg/dL — ABNORMAL HIGH (ref 70–99)
Glucose-Capillary: 142 mg/dL — ABNORMAL HIGH (ref 70–99)
Glucose-Capillary: 115 mg/dL — ABNORMAL HIGH (ref 70–99)
Glucose-Capillary: 143 mg/dL — ABNORMAL HIGH (ref 70–99)

## 2020-09-05 LAB — SARS CORONAVIRUS 2 BY RT PCR (HOSPITAL ORDER, PERFORMED IN ~~LOC~~ HOSPITAL LAB): SARS Coronavirus 2: NEGATIVE

## 2020-09-05 LAB — SODIUM, URINE, RANDOM: Sodium, Ur: 16 mmol/L

## 2020-09-05 LAB — BASIC METABOLIC PANEL
Anion gap: 11 (ref 5–15)
BUN: 49 mg/dL — ABNORMAL HIGH (ref 8–23)
CO2: 24 mmol/L (ref 22–32)
Calcium: 8.3 mg/dL — ABNORMAL LOW (ref 8.9–10.3)
Chloride: 101 mmol/L (ref 98–111)
Creatinine, Ser: 2.71 mg/dL — ABNORMAL HIGH (ref 0.44–1.00)
GFR calc Af Amer: 19 mL/min — ABNORMAL LOW (ref >60)
GFR calc non Af Amer: 17 mL/min — ABNORMAL LOW (ref >60)
Glucose, Bld: 112 mg/dL — ABNORMAL HIGH (ref 70–99)
Potassium: 3.8 mmol/L (ref 3.5–5.1)
Sodium: 136 mmol/L (ref 135–145)

## 2020-09-05 LAB — HEMOGLOBIN A1C
Hgb A1c MFr Bld: 5.6 % (ref 4.8–5.6)
Mean Plasma Glucose: 114.02 mg/dL

## 2020-09-05 LAB — PROCALCITONIN
Procalcitonin: <0.1 ng/mL
Procalcitonin: <0.1 ng/mL

## 2020-09-05 LAB — CREATININE, URINE, RANDOM: Creatinine, Urine: 302 mg/dL

## 2020-09-05 MED ORDER — MOMETASONE FUROATE 220 MCG/INH IN AEPB: 1.0000 | INHALATION_SPRAY | Freq: Every day | RESPIRATORY_TRACT | Status: DC

## 2020-09-05 MED ORDER — AZITHROMYCIN 500 MG PO TABS
500.0000 mg | ORAL_TABLET | Freq: Every day | ORAL | Status: AC
  Administered 2020-09-05: 500 mg via ORAL
  Filled 2020-09-05: qty 1

## 2020-09-05 MED ORDER — VENLAFAXINE HCL ER 75 MG PO CP24
75.0000 mg | ORAL_CAPSULE | Freq: Every day | ORAL | Status: DC
  Administered 2020-09-05 – 2020-09-07 (×3): 75 mg via ORAL
  Filled 2020-09-05 (×3): qty 1

## 2020-09-05 MED ORDER — ADULT MULTIVITAMIN W/MINERALS CH
1.0000 | ORAL_TABLET | Freq: Every day | ORAL | Status: DC
  Administered 2020-09-05 – 2020-09-07 (×3): 1 via ORAL
  Filled 2020-09-05 (×3): qty 1

## 2020-09-05 MED ORDER — VITAMIN D 25 MCG (1000 UNIT) PO TABS
1000.0000 [IU] | ORAL_TABLET | Freq: Every day | ORAL | Status: DC
  Administered 2020-09-05 – 2020-09-07 (×3): 1000 [IU] via ORAL
  Filled 2020-09-05 (×3): qty 1

## 2020-09-05 MED ORDER — AZITHROMYCIN 250 MG PO TABS
250.0000 mg | ORAL_TABLET | Freq: Every day | ORAL | Status: DC
  Administered 2020-09-06 – 2020-09-07 (×2): 250 mg via ORAL
  Filled 2020-09-05 (×2): qty 1

## 2020-09-05 MED ORDER — ALPRAZOLAM 0.5 MG PO TABS
0.5000 mg | ORAL_TABLET | Freq: Two times a day (BID) | ORAL | Status: DC | PRN
  Administered 2020-09-05 – 2020-09-06 (×2): 0.5 mg via ORAL
  Filled 2020-09-05 (×2): qty 1

## 2020-09-05 MED ORDER — TRAMADOL HCL 50 MG PO TABS
50.0000 mg | ORAL_TABLET | Freq: Once | ORAL | Status: AC
  Administered 2020-09-05: 50 mg via ORAL
  Filled 2020-09-05: qty 1

## 2020-09-05 MED ORDER — IPRATROPIUM-ALBUTEROL 0.5-2.5 (3) MG/3ML IN SOLN
3.0000 mL | Freq: Four times a day (QID) | RESPIRATORY_TRACT | Status: DC
  Administered 2020-09-05 – 2020-09-06 (×7): 3 mL via RESPIRATORY_TRACT
  Filled 2020-09-05 (×8): qty 3

## 2020-09-05 MED ORDER — MORPHINE SULFATE (PF) 2 MG/ML IV SOLN
2.0000 mg | INTRAVENOUS | Status: DC | PRN
  Administered 2020-09-05: 2 mg via INTRAVENOUS
  Filled 2020-09-05: qty 1

## 2020-09-05 MED ORDER — ACETAMINOPHEN-CODEINE #3 300-30 MG PO TABS
1.0000 | ORAL_TABLET | ORAL | Status: DC | PRN
  Administered 2020-09-05 – 2020-09-06 (×2): 1 via ORAL
  Filled 2020-09-05 (×2): qty 1

## 2020-09-05 MED ORDER — LACTATED RINGERS IV SOLN: INTRAVENOUS | Status: DC

## 2020-09-05 MED ORDER — TRAZODONE HCL 100 MG PO TABS
100.0000 mg | ORAL_TABLET | Freq: Every day | ORAL | Status: DC
  Administered 2020-09-05 – 2020-09-06 (×2): 100 mg via ORAL
  Filled 2020-09-05 (×2): qty 1

## 2020-09-05 MED ORDER — POLYETHYLENE GLYCOL 3350 17 G PO PACK
17.0000 g | PACK | Freq: Every day | ORAL | Status: DC
  Administered 2020-09-05 – 2020-09-07 (×3): 17 g via ORAL
  Filled 2020-09-05 (×3): qty 1

## 2020-09-05 MED ORDER — PANTOPRAZOLE SODIUM 40 MG PO TBEC
40.0000 mg | DELAYED_RELEASE_TABLET | Freq: Two times a day (BID) | ORAL | Status: DC
  Administered 2020-09-05 – 2020-09-07 (×5): 40 mg via ORAL
  Filled 2020-09-05 (×5): qty 1

## 2020-09-05 MED ORDER — DIPHENHYDRAMINE HCL 25 MG PO CAPS
25.0000 mg | ORAL_CAPSULE | Freq: Four times a day (QID) | ORAL | Status: DC | PRN
  Administered 2020-09-05 (×2): 25 mg via ORAL
  Filled 2020-09-05 (×2): qty 1

## 2020-09-05 MED ORDER — TOPIRAMATE 25 MG PO TABS
75.0000 mg | ORAL_TABLET | Freq: Every day | ORAL | Status: DC
  Administered 2020-09-05 – 2020-09-06 (×2): 75 mg via ORAL
  Filled 2020-09-05 (×3): qty 3

## 2020-09-05 MED ORDER — FLUTICASONE PROPIONATE HFA 44 MCG/ACT IN AERO
2.0000 | INHALATION_SPRAY | Freq: Two times a day (BID) | RESPIRATORY_TRACT | Status: DC
  Administered 2020-09-05 – 2020-09-07 (×5): 2 via RESPIRATORY_TRACT
  Filled 2020-09-05: qty 10.6

## 2020-09-05 NOTE — Progress Notes (Signed)
Patient ID: Sara Mcdonald, female   DOB: 1946-06-22, 74 y.o.   MRN: [id number] [facility] PROGRESS NOTE  ELLENE BLOODSAW YQI:[id number] DOB: 12-12-1946 DOA: 09/04/2020 PCP: [name], MD  HPI/Subjective: Patient seen this morning and still having a lot of abdominal pain. She describes her abdominal pain 10 out of 10 in intensity in the lower abdomen. Nothing makes the pain better or worse. Still having some cough and some shortness of breath.  Objective: Vitals:   09/05/20 1018 09/05/20 1525  BP: (!) 106/59 (!) 121/47  Pulse: 86 98  Resp: 19 20  Temp: 98.4 F (36.9 C) 98 F (36.7 C)  SpO2: 94% 95%    Intake/Output Summary (Last 24 hours) at 09/05/2020 1556 Last data filed at 09/05/2020 1524 Gross per 24 hour  Intake 221.88 ml  Output --  Net 221.88 ml   Filed Weights   09/04/20 1205  Weight: 90.7 kg    ROS: Review of Systems  Respiratory: Positive for cough and shortness of breath.   Cardiovascular: Negative for chest pain.  Gastrointestinal: Positive for abdominal pain. Negative for diarrhea, nausea and vomiting.   Exam: Physical Exam HENT:     Head: Normocephalic.     Nose: No mucosal edema.     Mouth/Throat:     Pharynx: No oropharyngeal exudate.  Eyes:     General: Lids are normal.     Conjunctiva/sclera: Conjunctivae normal.  Cardiovascular:     Rate and Rhythm: Normal rate and regular rhythm.     Heart sounds: Normal heart sounds, S1 normal and S2 normal.  Pulmonary:     Breath sounds: Examination of the right-middle field reveals wheezing. Examination of the left-middle field reveals wheezing. Examination of the right-lower field reveals decreased breath sounds and rhonchi. Examination of the left-lower field reveals decreased breath sounds and rhonchi. Decreased breath sounds, wheezing and rhonchi present. No rales.  Abdominal:     Palpations: Abdomen is soft.     Tenderness: There is abdominal tenderness in the suprapubic area.   Musculoskeletal:     Right knee: No swelling.     Left knee: No swelling.     Right lower leg: No swelling.     Left lower leg: No swelling.  Skin:    General: Skin is warm.     Findings: No rash.  Neurological:     Mental Status: She is alert and oriented to person, place, and time.       Data Reviewed: Basic Metabolic Panel: Recent Labs  Lab 09/04/20 1208 09/05/20 0545  NA 137 136  K 4.3 3.8  CL 99 101  CO2 25 24  GLUCOSE 129* 112*  BUN 45* 49*  CREATININE 3.25* 2.71*  CALCIUM 8.8* 8.3*   Liver Function Tests: Recent Labs  Lab 09/04/20 1208  AST 61*  ALT 22  ALKPHOS 66  BILITOT 0.8  PROT 7.0  ALBUMIN 3.7   Recent Labs  Lab 09/04/20 1208  LIPASE 25   CBC: Recent Labs  Lab 09/04/20 1208 09/05/20 0545  WBC 11.3* 8.0  HGB 13.2 10.7*  HCT 38.6 32.8*  MCV 90.4 91.6  PLT 253 181   CBG: Recent Labs  Lab 09/05/20 0840 09/05/20 1130  GLUCAP 108* 142*    Recent Results (from the past 240 hour(s))  CULTURE, BLOOD (ROUTINE X 2) w Reflex to ID Panel     Status: None (Preliminary result)   Collection Time: 09/05/20  1:16 AM   Specimen: BLOOD  Result  Value Ref Range Status   Specimen Description BLOOD LEFT ANTECUBITAL  Final   Special Requests   Final    BOTTLES DRAWN AEROBIC AND ANAEROBIC Blood Culture adequate volume   Culture   Final    NO GROWTH < 12 HOURS Performed at [facility], [address] [place], [place] [address]    Report Status PENDING  Incomplete  CULTURE, BLOOD (ROUTINE X 2) w Reflex to ID Panel     Status: None (Preliminary result)   Collection Time: 09/05/20  1:16 AM   Specimen: BLOOD  Result Value Ref Range Status   Specimen Description BLOOD LEFT ANTECUBITAL  Final   Special Requests   Final    BOTTLES DRAWN AEROBIC AND ANAEROBIC Blood Culture adequate volume   Culture   Final    NO GROWTH < 12 HOURS Performed at [facility], [address], [place], [place] [address]    Report Status PENDING   Incomplete  SARS Coronavirus 2 by RT PCR (hospital order, performed in [place] hospital lab) Nasopharyngeal Nasopharyngeal Swab     Status: None   Collection Time: 09/05/20  1:16 AM   Specimen: Nasopharyngeal Swab  Result Value Ref Range Status   SARS Coronavirus 2 NEGATIVE NEGATIVE Final    Comment: (NOTE) SARS-CoV-2 target nucleic acids are NOT DETECTED.  The SARS-CoV-2 RNA is generally detectable in upper and lower respiratory specimens during the acute phase of infection. The lowest concentration of SARS-CoV-2 viral copies this assay can detect is 250 copies / mL. A negative result does not preclude SARS-CoV-2 infection and should not be used as the sole basis for treatment or other patient management decisions.  A negative result may occur with improper specimen collection / handling, submission of specimen other than nasopharyngeal swab, presence of viral mutation(s) within the areas targeted by this assay, and inadequate number of viral copies (<250 copies / mL). A negative result must be combined with clinical observations, patient history, and epidemiological information.  Fact Sheet for Patients:   StrictlyIdeas.no  Fact Sheet for Healthcare Providers: BankingDealers.co.za  This test is not yet approved or  cleared by the [place] FDA and has been authorized for detection and/or diagnosis of SARS-CoV-2 by FDA under an Emergency Use Authorization (EUA).  This EUA will remain in effect (meaning this test can be used) for the duration of the COVID-19 declaration under Section 564(b)(1) of the Act, 21 U.S.C. section 360bbb-3(b)(1), unless the authorization is terminated or revoked sooner.  Performed at [facility], [address], [place], [place] [address]      Studies: CT ABDOMEN PELVIS WO CONTRAST  Result Date: 09/04/2020 CLINICAL DATA:  Left lower quadrant pain. EXAM: CT ABDOMEN AND PELVIS WITHOUT  CONTRAST TECHNIQUE: Multidetector CT imaging of the abdomen and pelvis was performed following the standard protocol without IV contrast. COMPARISON:  None. FINDINGS: Lower chest: No acute abnormality. Hepatobiliary: No focal liver abnormality is seen. Status post cholecystectomy. No biliary dilatation. Pancreas: Unremarkable. No pancreatic ductal dilatation or surrounding inflammatory changes. Spleen: Normal in size without focal abnormality. Adrenals/Urinary Tract: Adrenal glands are unremarkable. Kidneys are normal, without renal calculi or focal lesions. Stable prominence of a right extrarenal pelvis is seen. Bladder is unremarkable. Stomach/Bowel: Stomach is within normal limits. The appendix is not identified. No evidence of bowel dilatation. Noninflamed diverticula are seen throughout the large bowel. Vascular/Lymphatic: There is mild calcification of the abdominal aorta and bilateral common iliac arteries, without evidence of aneurysmal dilatation. No enlarged  abdominal or pelvic lymph nodes. Reproductive: Status post hysterectomy. No adnexal masses. Other: No abdominal wall hernia or abnormality. No abdominopelvic ascites. Musculoskeletal: No acute or significant osseous findings. IMPRESSION: 1. Noninflamed diverticula throughout the large bowel. 2. Evidence of prior cholecystectomy and hysterectomy. 3. Aortic atherosclerosis. Aortic Atherosclerosis (ICD10-I70.0). Electronically Signed   By: Virgina Norfolk M.D.   On: 09/04/2020 20:36   DG Chest 2 View  Result Date: 09/04/2020 CLINICAL DATA:  Sepsis, shortness of breath for several weeks EXAM: CHEST - 2 VIEW COMPARISON:  Radiograph 03/17/2017, CT 02/01/2017 FINDINGS: Chronic elevation of the right hemidiaphragm. Some reticular and bandlike opacities in the right lung base likely reflect areas of chronic scarring seen on comparison studies. No new focal consolidative opacity is clearly evident. No pneumothorax or visible effusion. No convincing  features of edema. Stable cardiomediastinal contours accounting for differences in technique. No acute osseous or soft tissue abnormality. Degenerative changes are present in the imaged spine and shoulders. Surgical clips in the right upper quadrant. IMPRESSION: Chronic elevation of the right hemidiaphragm with adjacent scarring. No acute cardiopulmonary abnormality. Electronically Signed   By: Lovena Le M.D.   On: 09/04/2020 22:44   CT HEAD WO CONTRAST  Result Date: 09/04/2020 CLINICAL DATA:  Altered level of consciousness, abdominal pain EXAM: CT HEAD WITHOUT CONTRAST TECHNIQUE: Contiguous axial images were obtained from the base of the skull through the vertex without intravenous contrast. COMPARISON:  None. FINDINGS: Brain: No acute infarct or hemorrhage. Lateral ventricles and midline structures are unremarkable. Chronic lacunar infarct versus dilated perivascular space left basal ganglia. No acute extra-axial fluid collections. No mass effect. Vascular: No hyperdense vessel or unexpected calcification. Skull: Normal. Negative for fracture or focal lesion. Sinuses/Orbits: No acute finding. Other: None. IMPRESSION: 1. No acute intracranial process. Electronically Signed   By: Randa Ngo M.D.   On: 09/04/2020 22:33   US RENAL  Result Date: 09/04/2020 CLINICAL DATA:  Acute renal insufficiency. EXAM: RENAL / URINARY TRACT ULTRASOUND COMPLETE COMPARISON:  None. FINDINGS: Right Kidney: Renal measurements: 9.9 cm x 5.0 cm x 5.0 cm = volume: 129 mL. Echogenicity within normal limits. No mass or hydronephrosis visualized. Left Kidney: Renal measurements: 9.9 cm x 4.0 cm x 5.2 cm = volume: 128 mL. Echogenicity within normal limits. No mass or hydronephrosis visualized. Bladder: Appears normal for degree of bladder distention. Other: None. IMPRESSION: Normal renal ultrasound. Electronically Signed   By: Virgina Norfolk M.D.   On: 09/04/2020 22:55    Scheduled Meds: . [START ON 09/06/2020] azithromycin   250 mg Oral Daily  . cholecalciferol  1,000 Units Oral Daily  . fluticasone  2 puff Inhalation BID  . heparin  5,000 Units Subcutaneous Q8H  . insulin aspart  0-9 Units Subcutaneous TID WC  . ipratropium-albuterol  3 mL Nebulization Q6H  . multivitamin with minerals  1 tablet Oral Daily  . pantoprazole  40 mg Oral BID  . polyethylene glycol  17 g Oral Daily  . pravastatin  80 mg Oral q1800  . sodium chloride flush  3 mL Intravenous Q12H  . topiramate  75 mg Oral QHS  . traZODone  100 mg Oral QHS  . venlafaxine XR  75 mg Oral Daily   Continuous Infusions: . cefTRIAXone (ROCEPHIN)  IV Stopped (09/05/20 0050)  . lactated ringers 75 mL/hr at 09/05/20 1345    Assessment/Plan:  1. Severe sepsis with acute kidney injury on chronic kidney disease stage III.  UTI likely source.  Urine culture pending.  Continue Rocephin  for now.  Follow-up cultures.  Procalcitonin actually negative. 2. Abdominal pain.  CT scan without contrast was negative.  As needed pain medications 3. Acute kidney injury on chronic kidney disease stage III.  Creatinine 3.25 on admission came down to 2.71 today.  Recheck creatinine tomorrow.  Hold Glucophage, Maxide and olmesartan 4. Acute bronchitis.  Covid test negative CT scan of the abdomen viewed the bottom part of the lungs which was negative for pneumonia.  Chest x-ray negative for pneumonia.  Added nebulizer treatments and Zithromax. 5. Weakness.  Physical therapy evaluation 6. Type 2 diabetes mellitus with hyperlipidemia.  On pravastatin.  Patient on sliding scale insulin.  Hold Glucophage 7. Anxiety depression continue psychiatric medications    Code Status:     Code Status Orders  (From admission, onward)         Start     Ordered   09/04/20 2229  Full code  Continuous        09/04/20 2228        Code Status History    Date Active Date Inactive Code Status Order ID Comments User Context   03/17/2017 1827 03/18/2017 1417 Full Code [id number]  Theodoro Grist, MD Inpatient   02/01/2017 1814 02/03/2017 1745 DNR [id number]  Loletha Grayer, MD ED   Advance Care Planning Activity     Family Communication: Daughter on the phone Disposition Plan: Status is: Inpatient  Dispo: The patient is from: Home              Anticipated d/c is to: Home              Anticipated d/c date is: Likely will need another couple days in the hospital with IV fluids and antibiotics               Patient currently being treated for severe sepsis and UTI and watching kidney function and acute kidney injury.  Antibiotics:  Rocephin  Zithromax  Time spent: 28 minutes  [place]

## 2020-09-05 NOTE — Progress Notes (Signed)
    09/05/20 1949  Assess: MEWS Score  Temp 99.3 F (37.4 C)  BP 111/65  Pulse Rate (!) 114  Resp 20  SpO2 96 %  Assess: MEWS Score  MEWS Temp 0  MEWS Systolic 0  MEWS Pulse 2  MEWS RR 0  MEWS LOC 0  MEWS Score 2  MEWS Score Color Yellow  Assess: if the MEWS score is Yellow or Red  Were vital signs taken at a resting state? Yes  MEWS guidelines implemented *See Row Information* Yes  Treat  MEWS Interventions Other (Comment) (will continue to monitor patient. NP notified. )  Pain Scale 0-10  Pain Score 0  Escalate  MEWS: Escalate Yellow: discuss with charge nurse/RN and consider discussing with provider and RRT  Notify: Charge Nurse/RN  Name of Charge Nurse/RN Notified Jeanie Sewer  Date Charge Nurse/RN Notified 09/05/20  Time Charge Nurse/RN Notified 2001  Notify: Provider  Provider Name/Title Rufina Falco  Date Provider Notified 09/05/20  Time Provider Notified 2003  Notification Type  (secure chat)  Notification Reason Other (Comment) (Elevated heart rate)

## 2020-09-05 NOTE — Progress Notes (Signed)
Physical Therapy Evaluation Patient Details Name: Sara Mcdonald MRN: [id number] DOB: [birth year] Today's Date: 09/05/2020   History of Present Illness  Per MD note:Sara Mcdonald is a 74 y.o. female with medical history significant for T2DM, CKD stage III, moderate persistent asthma, HTN, HLD, depression/anxiety, and OSA who presents to the ED for evaluation of abdominal pain.  Clinical Impression  Patient agrees to PT eval. She has 3/5 BLE hip flex and knee extension strength. She has good sitting balance and good standing balance.  She is MI with bed mobility, MI assist for transfers sit to stand with HHA. She ambulates with RW 15 feet with MI assist. She has fatigue following gait and high pain level in back and abdomen 10/10.  Patient will continue to benefit from skilled PT to improve mobility and strength.    Follow Up Recommendations Home health PT    Equipment Recommendations  Rolling walker with 5" wheels    Recommendations for Other Services       Precautions / Restrictions Restrictions Weight Bearing Restrictions: No      Mobility  Bed Mobility Overal bed mobility: Modified Independent                Transfers Overall transfer level: Modified independent Equipment used: Rolling walker (2 wheeled)                Ambulation/Gait Ambulation/Gait assistance: Modified independent (Device/Increase time) Gait Distance (Feet): 15 Feet Assistive device: 1 person hand held assist Gait Pattern/deviations: Step-through pattern        Stairs            Wheelchair Mobility    Modified Rankin (Stroke Patients Only)       Balance                                             Pertinent Vitals/Pain Pain Assessment: 0-10 Pain Score: 10-Worst pain ever Pain Location:  (abdoninal and back) Pain Descriptors / Indicators: Aching Pain Intervention(s): Limited activity within patient's tolerance;Monitored during session    Home  Living Family/patient expects to be discharged to:: Private residence Living Arrangements: Children Available Help at Discharge: Family Type of Home: House Home Access: Level entry              Prior Function Level of Independence: Independent               Hand Dominance        Extremity/Trunk Assessment                Communication   Communication: No difficulties  Cognition Arousal/Alertness: Awake/alert Behavior During Therapy: WFL for tasks assessed/performed Overall Cognitive Status: Within Functional Limits for tasks assessed                                        General Comments      Exercises     Assessment/Plan    PT Assessment Patient needs continued PT services  PT Problem List Decreased activity tolerance       PT Treatment Interventions Gait training;Therapeutic activities;Therapeutic exercise    PT Goals (Current goals can be found in the Care Plan section)  Acute Rehab PT Goals Patient Stated Goal: goals PT Goal Formulation: With patient/family Time For Goal  Achievement: 09/19/20 Potential to Achieve Goals: Fair    Frequency Min 2X/week   Barriers to discharge        Co-evaluation               AM-PAC PT "6 Clicks" Mobility  Outcome Measure Help needed turning from your back to your side while in a flat bed without using bedrails?: A Little Help needed moving from lying on your back to sitting on the side of a flat bed without using bedrails?: A Little Help needed moving to and from a bed to a chair (including a wheelchair)?: A Little Help needed standing up from a chair using your arms (e.g., wheelchair or bedside chair)?: A Little Help needed to walk in hospital room?: A Little Help needed climbing 3-5 steps with a railing? : A Little 6 Click Score: 18    End of Session Equipment Utilized During Treatment: Gait belt Activity Tolerance: No increased pain Patient left: in bed Nurse  Communication: Mobility status PT Visit Diagnosis: Difficulty in walking, not elsewhere classified (R26.2);Pain Pain - part of body:  (back/ abdomal)    Time: 0045-9977 PT Time Calculation (min) (ACUTE ONLY): 25 min   Charges:   PT Evaluation $PT Eval Low Complexity: 1 Low PT Treatments $Gait Training: 8-22 mins          Alanson Puls , PT DPT 09/05/2020, 2:45 PM

## 2020-09-05 NOTE — ED Notes (Signed)
Pt IV L forearm found to be infiltrated. Red, swollen, painful with NS flush. IV removed. Attempted IV insertion at L AC 2 times. Both times,obtained blood return but could not advance catheter. Appeared to be against valve.

## 2020-09-05 NOTE — ED Notes (Signed)
Blood cultures not drawn prior to abx and fluids d/t orders in signed and held and not yet reviewed by this RN or Larene Beach, RN who also was assisting with care with this pt.  Will inform admitting MD.

## 2020-09-05 NOTE — Progress Notes (Signed)
Sara Mcdonald notified regarding MEWS score and heart rate of 120. No new orders at this time.

## 2020-09-05 NOTE — ED Notes (Signed)
Pt c/o 10/10 abdominal pain. Pt states "I would like something stronger than tylenol". Admitting MD messaged, awaiting orders.

## 2020-09-05 NOTE — ED Notes (Signed)
Pt given breakfast tray

## 2020-09-06 DIAGNOSIS — R1084 Generalized abdominal pain: Secondary | ICD-10-CM

## 2020-09-06 LAB — BASIC METABOLIC PANEL
Anion gap: 10 (ref 5–15)
BUN: 44 mg/dL — ABNORMAL HIGH (ref 8–23)
CO2: 27 mmol/L (ref 22–32)
Calcium: 8.6 mg/dL — ABNORMAL LOW (ref 8.9–10.3)
Chloride: 100 mmol/L (ref 98–111)
Creatinine, Ser: 1.86 mg/dL — ABNORMAL HIGH (ref 0.44–1.00)
GFR calc Af Amer: 30 mL/min — ABNORMAL LOW (ref >60)
GFR calc non Af Amer: 26 mL/min — ABNORMAL LOW (ref >60)
Glucose, Bld: 123 mg/dL — ABNORMAL HIGH (ref 70–99)
Potassium: 3.8 mmol/L (ref 3.5–5.1)
Sodium: 137 mmol/L (ref 135–145)

## 2020-09-06 LAB — GLUCOSE, CAPILLARY
Glucose-Capillary: 111 mg/dL — ABNORMAL HIGH (ref 70–99)
Glucose-Capillary: 114 mg/dL — ABNORMAL HIGH (ref 70–99)

## 2020-09-06 LAB — URINE CULTURE: Culture: >=100000 — AB

## 2020-09-06 MED ORDER — GUAIFENESIN-DM 100-10 MG/5ML PO SYRP
5.0000 mL | ORAL_SOLUTION | ORAL | Status: DC | PRN
  Administered 2020-09-06: 5 mL via ORAL
  Filled 2020-09-06: qty 5

## 2020-09-06 MED ORDER — AMOXICILLIN 500 MG PO CAPS
1000.0000 mg | ORAL_CAPSULE | Freq: Three times a day (TID) | ORAL | Status: DC
  Administered 2020-09-06 – 2020-09-07 (×3): 1000 mg via ORAL
  Filled 2020-09-06 (×5): qty 2

## 2020-09-06 NOTE — Progress Notes (Signed)
Patient ID: Sara Mcdonald, female   DOB: 28-Apr-1946, 74 y.o.   MRN: [id number] [facility] PROGRESS NOTE  MYLEIGH AMARA MVE:[id number] DOB: 10-19-1946 DOA: 09/04/2020 PCP: [name], MD  HPI/Subjective: Patient feeling better today than yesterday.  Abdominal pain down to 4 out of 10 intensity.  Ate breakfast this morning.  No nausea or vomiting.  Kidney function better today.  Admitted with sepsis.  Objective: Vitals:   09/06/20 0755 09/06/20 1133  BP: (!) 150/78 124/74  Pulse: (!) 106 (!) 106  Resp: 20 18  Temp: 97.9 F (36.6 C) 97.8 F (36.6 C)  SpO2: 100% 95%    Intake/Output Summary (Last 24 hours) at 09/06/2020 1450 Last data filed at 09/06/2020 1009 Gross per 24 hour  Intake 1410.33 ml  Output 0 ml  Net 1410.33 ml   Filed Weights   09/04/20 1205  Weight: 90.7 kg    ROS: Review of Systems  Respiratory: Negative for cough and shortness of breath.   Cardiovascular: Negative for chest pain.  Gastrointestinal: Positive for abdominal pain. Negative for nausea and vomiting.   Exam: Physical Exam HENT:     Head: Normocephalic.     Nose: No mucosal edema.     Mouth/Throat:     Pharynx: No oropharyngeal exudate.  Eyes:     General: Lids are normal.     Conjunctiva/sclera: Conjunctivae normal.  Cardiovascular:     Rate and Rhythm: Regular rhythm. Tachycardia present.     Heart sounds: Normal heart sounds, S1 normal and S2 normal.  Pulmonary:     Breath sounds: No decreased breath sounds, wheezing, rhonchi or rales.  Abdominal:     Palpations: Abdomen is soft.     Tenderness: There is no abdominal tenderness.  Musculoskeletal:     Right lower leg: No swelling.     Left lower leg: No swelling.  Skin:    General: Skin is warm.     Findings: No rash.  Neurological:     Mental Status: She is alert and oriented to person, place, and time.       Data Reviewed: Basic Metabolic Panel: Recent Labs  Lab 09/04/20 1208 09/05/20 0545 09/06/20 0421   NA 137 136 137  K 4.3 3.8 3.8  CL 99 101 100  CO2 25 24 27   GLUCOSE 129* 112* 123*  BUN 45* 49* 44*  CREATININE 3.25* 2.71* 1.86*  CALCIUM 8.8* 8.3* 8.6*   Liver Function Tests: Recent Labs  Lab 09/04/20 1208  AST 61*  ALT 22  ALKPHOS 66  BILITOT 0.8  PROT 7.0  ALBUMIN 3.7   Recent Labs  Lab 09/04/20 1208  LIPASE 25   CBC: Recent Labs  Lab 09/04/20 1208 09/05/20 0545  WBC 11.3* 8.0  HGB 13.2 10.7*  HCT 38.6 32.8*  MCV 90.4 91.6  PLT 253 181    CBG: Recent Labs  Lab 09/05/20 1130 09/05/20 1646 09/05/20 2117 09/06/20 0752 09/06/20 1135  GLUCAP 142* 115* 143* 111* 114*    Recent Results (from the past 240 hour(s))  Urine culture     Status: Abnormal   Collection Time: 09/04/20  5:25 PM   Specimen: Urine, Random  Result Value Ref Range Status   Specimen Description   Final    URINE, RANDOM Performed at [facility], [address], [place], [place] [address]    Special Requests   Final    NONE Performed at [facility], [address] [place], [place] [address]  Culture (A)  Final    >=100,000 COLONIES/mL LACTOBACILLUS SPECIES Standardized susceptibility testing for this organism is not available. Performed at [facility], [place] [address] [place], [place] [address]    Report Status 09/06/2020 FINAL  Final  CULTURE, BLOOD (ROUTINE X 2) w Reflex to ID Panel     Status: None (Preliminary result)   Collection Time: 09/05/20  1:16 AM   Specimen: BLOOD  Result Value Ref Range Status   Specimen Description BLOOD LEFT ANTECUBITAL  Final   Special Requests   Final    BOTTLES DRAWN AEROBIC AND ANAEROBIC Blood Culture adequate volume   Culture   Final    NO GROWTH 1 DAY Performed at [facility], [address], [place], [place] [address]    Report Status PENDING  Incomplete  CULTURE, BLOOD (ROUTINE X 2) w Reflex to ID Panel     Status: None (Preliminary result)   Collection Time: 09/05/20  1:16 AM   Specimen:  BLOOD  Result Value Ref Range Status   Specimen Description BLOOD LEFT ANTECUBITAL  Final   Special Requests   Final    BOTTLES DRAWN AEROBIC AND ANAEROBIC Blood Culture adequate volume   Culture   Final    NO GROWTH 1 DAY Performed at [facility], [address], [place], [place] [address]    Report Status PENDING  Incomplete  SARS Coronavirus 2 by RT PCR (hospital order, performed in [place] hospital lab) Nasopharyngeal Nasopharyngeal Swab     Status: None   Collection Time: 09/05/20  1:16 AM   Specimen: Nasopharyngeal Swab  Result Value Ref Range Status   SARS Coronavirus 2 NEGATIVE NEGATIVE Final    Comment: (NOTE) SARS-CoV-2 target nucleic acids are NOT DETECTED.  The SARS-CoV-2 RNA is generally detectable in upper and lower respiratory specimens during the acute phase of infection. The lowest concentration of SARS-CoV-2 viral copies this assay can detect is 250 copies / mL. A negative result does not preclude SARS-CoV-2 infection and should not be used as the sole basis for treatment or other patient management decisions.  A negative result may occur with improper specimen collection / handling, submission of specimen other than nasopharyngeal swab, presence of viral mutation(s) within the areas targeted by this assay, and inadequate number of viral copies (<250 copies / mL). A negative result must be combined with clinical observations, patient history, and epidemiological information.  Fact Sheet for Patients:   StrictlyIdeas.no  Fact Sheet for Healthcare Providers: BankingDealers.co.za  This test is not yet approved or  cleared by the [place] FDA and has been authorized for detection and/or diagnosis of SARS-CoV-2 by FDA under an Emergency Use Authorization (EUA).  This EUA will remain in effect (meaning this test can be used) for the duration of the COVID-19 declaration under Section 564(b)(1) of the  Act, 21 U.S.C. section 360bbb-3(b)(1), unless the authorization is terminated or revoked sooner.  Performed at [facility], [address] [place], [place] [address]      Studies: CT ABDOMEN PELVIS WO CONTRAST  Result Date: 09/04/2020 CLINICAL DATA:  Left lower quadrant pain. EXAM: CT ABDOMEN AND PELVIS WITHOUT CONTRAST TECHNIQUE: Multidetector CT imaging of the abdomen and pelvis was performed following the standard protocol without IV contrast. COMPARISON:  None. FINDINGS: Lower chest: No acute abnormality. Hepatobiliary: No focal liver abnormality is seen. Status post cholecystectomy. No biliary dilatation. Pancreas: Unremarkable. No pancreatic ductal dilatation or surrounding inflammatory changes. Spleen: Normal in size without focal  abnormality. Adrenals/Urinary Tract: Adrenal glands are unremarkable. Kidneys are normal, without renal calculi or focal lesions. Stable prominence of a right extrarenal pelvis is seen. Bladder is unremarkable. Stomach/Bowel: Stomach is within normal limits. The appendix is not identified. No evidence of bowel dilatation. Noninflamed diverticula are seen throughout the large bowel. Vascular/Lymphatic: There is mild calcification of the abdominal aorta and bilateral common iliac arteries, without evidence of aneurysmal dilatation. No enlarged abdominal or pelvic lymph nodes. Reproductive: Status post hysterectomy. No adnexal masses. Other: No abdominal wall hernia or abnormality. No abdominopelvic ascites. Musculoskeletal: No acute or significant osseous findings. IMPRESSION: 1. Noninflamed diverticula throughout the large bowel. 2. Evidence of prior cholecystectomy and hysterectomy. 3. Aortic atherosclerosis. Aortic Atherosclerosis (ICD10-I70.0). Electronically Signed   By: Virgina Norfolk M.D.   On: 09/04/2020 20:36   DG Chest 2 View  Result Date: 09/04/2020 CLINICAL DATA:  Sepsis, shortness of breath for several weeks EXAM: CHEST - 2 VIEW COMPARISON:   Radiograph 03/17/2017, CT 02/01/2017 FINDINGS: Chronic elevation of the right hemidiaphragm. Some reticular and bandlike opacities in the right lung base likely reflect areas of chronic scarring seen on comparison studies. No new focal consolidative opacity is clearly evident. No pneumothorax or visible effusion. No convincing features of edema. Stable cardiomediastinal contours accounting for differences in technique. No acute osseous or soft tissue abnormality. Degenerative changes are present in the imaged spine and shoulders. Surgical clips in the right upper quadrant. IMPRESSION: Chronic elevation of the right hemidiaphragm with adjacent scarring. No acute cardiopulmonary abnormality. Electronically Signed   By: Lovena Le M.D.   On: 09/04/2020 22:44   CT HEAD WO CONTRAST  Result Date: 09/04/2020 CLINICAL DATA:  Altered level of consciousness, abdominal pain EXAM: CT HEAD WITHOUT CONTRAST TECHNIQUE: Contiguous axial images were obtained from the base of the skull through the vertex without intravenous contrast. COMPARISON:  None. FINDINGS: Brain: No acute infarct or hemorrhage. Lateral ventricles and midline structures are unremarkable. Chronic lacunar infarct versus dilated perivascular space left basal ganglia. No acute extra-axial fluid collections. No mass effect. Vascular: No hyperdense vessel or unexpected calcification. Skull: Normal. Negative for fracture or focal lesion. Sinuses/Orbits: No acute finding. Other: None. IMPRESSION: 1. No acute intracranial process. Electronically Signed   By: Randa Ngo M.D.   On: 09/04/2020 22:33   US RENAL  Result Date: 09/04/2020 CLINICAL DATA:  Acute renal insufficiency. EXAM: RENAL / URINARY TRACT ULTRASOUND COMPLETE COMPARISON:  None. FINDINGS: Right Kidney: Renal measurements: 9.9 cm x 5.0 cm x 5.0 cm = volume: 129 mL. Echogenicity within normal limits. No mass or hydronephrosis visualized. Left Kidney: Renal measurements: 9.9 cm x 4.0 cm x 5.2 cm =  volume: 128 mL. Echogenicity within normal limits. No mass or hydronephrosis visualized. Bladder: Appears normal for degree of bladder distention. Other: None. IMPRESSION: Normal renal ultrasound. Electronically Signed   By: Virgina Norfolk M.D.   On: 09/04/2020 22:55    Scheduled Meds: . amoxicillin  1,000 mg Oral Q8H  . azithromycin  250 mg Oral Daily  . cholecalciferol  1,000 Units Oral Daily  . fluticasone  2 puff Inhalation BID  . heparin  5,000 Units Subcutaneous Q8H  . ipratropium-albuterol  3 mL Nebulization Q6H  . multivitamin with minerals  1 tablet Oral Daily  . pantoprazole  40 mg Oral BID  . polyethylene glycol  17 g Oral Daily  . pravastatin  80 mg Oral q1800  . sodium chloride flush  3 mL Intravenous Q12H  . topiramate  75 mg Oral  QHS  . traZODone  100 mg Oral QHS  . venlafaxine XR  75 mg Oral Daily   Continuous Infusions:  Assessment/Plan:  1. Severe sepsis with acute kidney injury on chronic kidney disease stage III.  Lactobacillus UTI.  Spoke with pharmacist on high-dose penicillin is the best choice.  Patient does have a penicillin allergy but it has been a long time and it was only a rash.  Willing to try a test dose of amoxicillin here.  If has a rash with amoxicillin can go back to the Rocephin and Omnicef upon discharge if no rash will discharge home tomorrow on high-dose amoxicillin. 2. Abdominal pain.  CT scan without contrast was negative.  As needed pain medication. 3. Acute kidney injury on chronic disease stage III.  Baseline creatinine around 1.5.  Creatinine upon presentation 3.25.  Creatinine today 1.86.  Will hold Glucophage, Maxide and on losartan. 4. Acute bronchitis.  Better today.  Covid negative.  Continue nebulizer treatments.  Also on Zithromax. 5. Weakness.  Physical therapy recommended home with home health. 6. Type 2 diabetes mellitus with hyperlipidemia on pravastatin.  Continue sliding scale insulin. 7. Anxiety, depression.  Continue  psychiatric medications     Code Status:     Code Status Orders  (From admission, onward)         Start     Ordered   09/04/20 2229  Full code  Continuous        09/04/20 2228        Code Status History    Date Active Date Inactive Code Status Order ID Comments User Context   03/17/2017 1827 03/18/2017 1417 Full Code [id number]  Theodoro Grist, MD Inpatient   02/01/2017 1814 02/03/2017 1745 DNR [id number]  Loletha Grayer, MD ED   Advance Care Planning Activity     Family Communication: Spoke with daughter on the phone Disposition Plan: Status is: Inpatient  Dispo: The patient is from: Home              Anticipated d/c is to: Home with home health              Anticipated d/c date is: 09/07/2020              Patient currently being treated for severe sepsis with lactobacillus UTI.  Treatment of choice is high-dose penicillin.  We will give a test dose of penicillin here since she did have an allergy in the past.  Antibiotics:  Change Rocephin over to amoxicillin high-dose to treat lactobacillus.  Patient does have a history of allergy to penicillin but has been a long time ago and it was just a rash.  Time spent: 32 minutes in coordination of care with speaking with nursing staff and pharmacist  [place]

## 2020-09-06 NOTE — TOC Initial Note (Signed)
Transition of Care [facility]) - Initial/Assessment Note    Patient Details  Name: Sara Mcdonald MRN: [id number] Date of Birth: 03/02/1946  [facility]) CM/SW Contact:    Boris Sharper, LCSW Phone Number: 09/06/2020, 1:25 PM  Clinical Narrative:                 CSW contacted pt to discuss discharge plans. Pt was agreeable to [facility] and she did not have a preference in agencies. CSW provided referral to [facility] and she was able to accept to pt for [facility] PT. Pt stated that she already has a walker at home and lives with her daughter.   Expected Discharge Plan: [place] Barriers to Discharge: Continued Medical Work up   Patient Goals and CMS Choice   CMS Medicare.gov Compare Post Acute Care list provided to:: Patient Choice offered to / list presented to : Patient  Expected Discharge Plan and Services Expected Discharge Plan: [place]       Living arrangements for the past 2 months: Single Family Home                           HH Arranged: PT [facility] Agency: [facility] Date [place]: 09/06/20 Time [place]: [place] Representative spoke with at [place]: Reine Just  Prior Living Arrangements/Services Living arrangements for the past 2 months: [place] Lives with:: Adult Children Patient language and need for interpreter reviewed:: Yes Do you feel safe going back to the place where you live?: Yes      Need for Family Participation in Patient Care: Yes (Comment) Care giver support system in place?: Yes (comment) (adult children)   Criminal Activity/Legal Involvement Pertinent to Current Situation/Hospitalization: No - Comment as needed  Activities of Daily Living Home Assistive Devices/Equipment: None ADL Screening (condition at time of admission) Patient's cognitive ability adequate to safely complete daily activities?: Yes Is the patient deaf or have difficulty hearing?: No Does  the patient have difficulty seeing, even when wearing glasses/contacts?: No Does the patient have difficulty concentrating, remembering, or making decisions?: No Patient able to express need for assistance with ADLs?: Yes Does the patient have difficulty dressing or bathing?: No Independently performs ADLs?: Yes (appropriate for developmental age) Does the patient have difficulty walking or climbing stairs?: No Weakness of Legs: None Weakness of Arms/Hands: None  Permission Sought/Granted Permission sought to share information with : Facility Art therapist granted to share information with : Yes, Verbal Permission Granted  Share Information with NAME: stephanie     Permission granted to share info w Relationship: daughter  Permission granted to share info w Contact Information: [phone]  Emotional Assessment Appearance:: Other (Comment Required (unable to assess) Attitude/Demeanor/Rapport: Unable to Assess Affect (typically observed): Unable to Assess Orientation: : Oriented to Self, Oriented to Place, Oriented to  Time, Oriented to Situation Alcohol / Substance Use: Not Applicable Psych Involvement: No (comment)  Admission diagnosis:  Sinus tachycardia [R00.0] LLQ abdominal pain [R10.32] AKI (acute kidney injury) ([place]) [C16.6] Complicated UTI (urinary tract infection) [N39.0] Severe sepsis with acute organ dysfunction ([place]) [A41.9, R65.20] Patient Active Problem List   Diagnosis Date Noted  . Acute bronchitis   . Weakness   . Severe sepsis with acute organ dysfunction ([place]) 09/04/2020  . Diabetes mellitus without complication ([place])   . Hypertension associated with diabetes ([place])   . Acute kidney  injury superimposed on CKD ([place])   . Acute lower UTI   . Anxiety and depression   . OSA (obstructive sleep apnea)   . Type 2 diabetes mellitus with hyperlipidemia ([place])   . Acute metabolic encephalopathy   . Moderate persistent asthma   . Sepsis ([place])  03/17/2017  . Community acquired pneumonia 03/17/2017  . COPD with acute exacerbation ([place]) 03/17/2017  . Hypoxia 03/17/2017  . Hyponatremia 03/17/2017  . Acute on chronic renal insufficiency 03/17/2017  . Pneumonia 03/17/2017  . Acute respiratory failure with hypoxia ([place]) 02/01/2017   PCP:  [name], MD Pharmacy:   [facility] [phone] - [address], [place] [address] [place] [address] Phone: [phone] Fax: [phone]     Social Determinants of Health (SDOH) Interventions    Readmission Risk Interventions No flowsheet data found.

## 2020-09-07 DIAGNOSIS — R000 Tachycardia, unspecified: Secondary | ICD-10-CM

## 2020-09-07 LAB — BASIC METABOLIC PANEL
Anion gap: 9 (ref 5–15)
BUN: 33 mg/dL — ABNORMAL HIGH (ref 8–23)
CO2: 27 mmol/L (ref 22–32)
Calcium: 9.1 mg/dL (ref 8.9–10.3)
Chloride: 103 mmol/L (ref 98–111)
Creatinine, Ser: 1.38 mg/dL — ABNORMAL HIGH (ref 0.44–1.00)
GFR calc Af Amer: 44 mL/min — ABNORMAL LOW (ref >60)
GFR calc non Af Amer: 38 mL/min — ABNORMAL LOW (ref >60)
Glucose, Bld: 118 mg/dL — ABNORMAL HIGH (ref 70–99)
Potassium: 3.9 mmol/L (ref 3.5–5.1)
Sodium: 139 mmol/L (ref 135–145)

## 2020-09-07 MED ORDER — AZITHROMYCIN 250 MG PO TABS: ORAL_TABLET | ORAL | 0 refills | Status: DC

## 2020-09-07 MED ORDER — AMOXICILLIN 500 MG PO CAPS: 1000.0000 mg | ORAL_CAPSULE | Freq: Three times a day (TID) | ORAL | 0 refills | Status: AC

## 2020-09-07 NOTE — Discharge Summary (Signed)
[facility] NAME: Sara Mcdonald    MR#:  [id number]  DATE OF BIRTH:  29-May-1946  DATE OF ADMISSION:  09/04/2020 ADMITTING PHYSICIAN: Lenore Cordia, MD  DATE OF DISCHARGE: 09/07/2020 12:26 PM  PRIMARY CARE PHYSICIAN: [name], MD    ADMISSION DIAGNOSIS:  Sinus tachycardia [R00.0] LLQ abdominal pain [R10.32] AKI (acute kidney injury) ([place]) [Y17.4] Complicated UTI (urinary tract infection) [N39.0] Severe sepsis with acute organ dysfunction ([place]) [A41.9, R65.20]  DISCHARGE DIAGNOSIS:  Principal Problem:   Severe sepsis with acute organ dysfunction (HCC) Active Problems:   Diabetes mellitus without complication ([place])   Hypertension associated with diabetes ([place])   Acute kidney injury superimposed on CKD ([place])   Acute lower UTI   Anxiety and depression   OSA (obstructive sleep apnea)   Type 2 diabetes mellitus with hyperlipidemia (HCC)   Acute metabolic encephalopathy   Moderate persistent asthma   Acute bronchitis   Weakness   Generalized abdominal pain   SECONDARY DIAGNOSIS:   Past Medical History:  Diagnosis Date  . Anxiety   . Depression   . Diabetes mellitus without complication ([place])   . Elevated lipids   . Fibromyalgia   . Headache   . Hypertension   . OA (osteoarthritis)   . Sleep apnea     HOSPITAL COURSE:   1.  Severe sepsis present on admission with acute kidney injury on chronic kidney disease stage IIIb.  Lactobacillus growing out of urinary tract infection.  The patient also had severe abdominal pain.  The patient initially received IV Rocephin and improved a little bit.  Treatment of choice for lactobacillus as high dose penicillin.  Unfortunately the patient did have an allergy to penicillin was many years ago and only a rash.  I did give a test dose of amoxicillin here and patient tolerated 2 doses without any rash.  She will get a another dose prior to disposition.  I will continue 6 more days  of amoxicillin 1 g every 8 hours.  Recommend outpatient mammogram. 2.  Abdominal pain.  CT scan without contrast was negative.  As needed pain medication was given during the hospital course.  Patient had no abdominal pain upon disposition home. 3.  Acute kidney injury on chronic kidney disease stage IIIb.  Creatinine upon presentation was 3.25.  Creatinine improved to 1.38 upon disposition. 4.  Acute bronchitis.  Patient's lungs are clear upon discharge home.  Patient was Covid negative.  Patient was also given Zithromax while here in the hospital and will complete that course upon disposition 2 more doses. 5.  Weakness.  Physical therapy recommended home with home health. 6.  Type 2 diabetes mellitus with hyperlipidemia.  Continue pravastatin.  Can go back on Glucophage as outpatient since creatinine improved. 7.  Anxiety depression continue psychiatric medications 8.  Essential hypertension.  Blood pressure medications were held during the hospital course.  Can go back on on losartan as outpatient but continue to hold Maxide. 9.  Tachycardia heart rate ranges between the high 90s and low 100s.  Patient states that this is normal for her.  Follow-up with PMD as outpatient.  DISCHARGE CONDITIONS:   Satisfactory  CONSULTS OBTAINED:  None  DRUG ALLERGIES:   Allergies  Allergen Reactions  . Penicillins Rash  . Sulfa Antibiotics Rash  . Sulfur Rash  . Tricor [Fenofibrate] Rash    DISCHARGE MEDICATIONS:   Allergies as of 09/07/2020      Reactions  Penicillins Rash   Sulfa Antibiotics Rash   Sulfur Rash   Tricor [fenofibrate] Rash      Medication List    STOP taking these medications   triamterene-hydrochlorothiazide 37.5-25 MG tablet Commonly known as: MAXZIDE-25     TAKE these medications   acetaminophen-codeine 300-30 MG tablet Commonly known as: TYLENOL #3 Take by mouth every 4 (four) hours as needed for moderate pain.   ALPRAZolam 0.5 MG tablet Commonly known as:  XANAX Take 0.5 mg by mouth 2 (two) times daily as needed for anxiety or sleep.   amoxicillin 500 MG capsule Commonly known as: AMOXIL Take 2 capsules (1,000 mg total) by mouth every 8 (eight) hours for 6 days.   azithromycin 250 MG tablet Commonly known as: ZITHROMAX One tab daily for two days Start taking on: September 08, 2020   cholecalciferol 25 MCG (1000 UNIT) tablet Commonly known as: VITAMIN D3 Take 1,000 Units by mouth daily.   colchicine 0.6 MG tablet Take 0.6 mg by mouth 2 (two) times daily as needed (gout).   metFORMIN 500 MG tablet Commonly known as: GLUCOPHAGE Take 500 mg by mouth daily.   mometasone 220 MCG/INH inhaler Commonly known as: ASMANEX Inhale 1 puff into the lungs daily.   multivitamin with minerals Tabs tablet Take 1 tablet by mouth daily.   olmesartan 20 MG tablet Commonly known as: BENICAR Take 20 mg by mouth daily.   pantoprazole 40 MG tablet Commonly known as: PROTONIX Take 40 mg by mouth 2 (two) times daily.   Phenergan 25 MG suppository Generic drug: promethazine Place 25 mg rectally every 6 (six) hours as needed for nausea or vomiting.   promethazine 25 MG tablet Commonly known as: PHENERGAN Take 25 mg by mouth every 6 (six) hours as needed for nausea or vomiting.   polyethylene glycol powder 17 GM/SCOOP powder Commonly known as: GLYCOLAX/MIRALAX Take 17 g by mouth daily.   pravastatin 80 MG tablet Commonly known as: PRAVACHOL Take 80 mg by mouth daily.   topiramate 25 MG tablet Commonly known as: TOPAMAX Take 75 mg by mouth at bedtime.   traZODone 100 MG tablet Commonly known as: DESYREL Take 100 mg by mouth at bedtime.   venlafaxine XR 75 MG 24 hr capsule Commonly known as: EFFEXOR-XR Take 75 mg by mouth daily.        DISCHARGE INSTRUCTIONS:   Follow-up PMD 5 days  If you experience worsening of your admission symptoms, develop shortness of breath, life threatening emergency, suicidal or homicidal thoughts you  must seek medical attention immediately by calling 911 or calling your MD immediately  if symptoms less severe.  You Must read complete instructions/literature along with all the possible adverse reactions/side effects for all the Medicines you take and that have been prescribed to you. Take any new Medicines after you have completely understood and accept all the possible adverse reactions/side effects.   Please note  You were cared for by a hospitalist during your hospital stay. If you have any questions about your discharge medications or the care you received while you were in the hospital after you are discharged, you can call the unit and asked to speak with the hospitalist on call if the hospitalist that took care of you is not available. Once you are discharged, your primary care physician will handle any further medical issues. Please note that NO REFILLS for any discharge medications will be authorized once you are discharged, as it is imperative that you return to your primary  care physician (or establish a relationship with a primary care physician if you do not have one) for your aftercare needs so that they can reassess your need for medications and monitor your lab values.    Today   CHIEF COMPLAINT:   Chief Complaint  Patient presents with  . Abdominal Pain    HISTORY OF PRESENT ILLNESS:  Kiley Solimine  is a 74 y.o. female came in with severe abdominal pain   VITAL SIGNS:  Blood pressure 136/62, pulse 99, temperature 97.8 F (36.6 C), temperature source Oral, resp. rate 18, height 4\' 11"  (1.499 m), weight 90.7 kg, SpO2 99 %.  I/O:    Intake/Output Summary (Last 24 hours) at 09/07/2020 1644 Last data filed at 09/06/2020 1731 [place] per 24 hour  Intake 625.14 ml  Output --  Net 625.14 ml    PHYSICAL EXAMINATION:  GENERAL:  74 y.o.-year-old patient lying in the bed with no acute distress.  EYES: Pupils equal, round, reactive to light and accommodation. No scleral  icterus. Extraocular muscles intact.  HEENT: Head atraumatic, normocephalic. Oropharynx and nasopharynx clear.  LUNGS: Normal breath sounds bilaterally, no wheezing, rales,rhonchi or crepitation. No use of accessory muscles of respiration.  CARDIOVASCULAR: S1, S2 tachycardic. No murmurs, rubs, or gallops.  ABDOMEN: Soft, non-tender.  EXTREMITIES: No pedal edema.  NEUROLOGIC: Cranial nerves II through XII are intact. Muscle strength 5/5 in all extremities. Sensation intact. Gait not checked.  PSYCHIATRIC: The patient is alert and oriented x 3.  SKIN: No obvious rash, lesion, or ulcer.   DATA REVIEW:   CBC Recent Labs  Lab 09/05/20 0545  WBC 8.0  HGB 10.7*  HCT 32.8*  PLT 181    Chemistries  Recent Labs  Lab 09/04/20 1208 09/05/20 0545 09/07/20 0539  NA 137   < > 139  K 4.3   < > 3.9  CL 99   < > 103  CO2 25   < > 27  GLUCOSE 129*   < > 118*  BUN 45*   < > 33*  CREATININE 3.25*   < > 1.38*  CALCIUM 8.8*   < > 9.1  AST 61*  --   --   ALT 22  --   --   ALKPHOS 66  --   --   BILITOT 0.8  --   --    < > = values in this interval not displayed.    Cardiac Enzymes No results for input(s): TROPONINI in the last 168 hours.  Microbiology Results  Results for orders placed or performed during the hospital encounter of 09/04/20  Urine culture     Status: Abnormal   Collection Time: 09/04/20  5:25 PM   Specimen: Urine, Random  Result Value Ref Range Status   Specimen Description   Final    URINE, RANDOM Performed at [facility], [address], [place], [place] [address]    Special Requests   Final    NONE Performed at [facility], [address] [place], [place] [address]    Culture (A)  Final    >=100,000 COLONIES/mL LACTOBACILLUS SPECIES Standardized susceptibility testing for this organism is not available. Performed at [facility], [place] [address], [place],  [address]    Report Status 09/06/2020 FINAL  Final  CULTURE, BLOOD  (ROUTINE X 2) w Reflex to ID Panel     Status: None (Preliminary result)   Collection Time: 09/05/20  1:16 AM   Specimen: BLOOD  Result  Value Ref Range Status   Specimen Description BLOOD LEFT ANTECUBITAL  Final   Special Requests   Final    BOTTLES DRAWN AEROBIC AND ANAEROBIC Blood Culture adequate volume   Culture   Final    NO GROWTH 2 DAYS Performed at [facility], [address] [place], [place] [address]    Report Status PENDING  Incomplete  CULTURE, BLOOD (ROUTINE X 2) w Reflex to ID Panel     Status: None (Preliminary result)   Collection Time: 09/05/20  1:16 AM   Specimen: BLOOD  Result Value Ref Range Status   Specimen Description BLOOD LEFT ANTECUBITAL  Final   Special Requests   Final    BOTTLES DRAWN AEROBIC AND ANAEROBIC Blood Culture adequate volume   Culture   Final    NO GROWTH 2 DAYS Performed at [facility], [address] [place], [place] [address]    Report Status PENDING  Incomplete  SARS Coronavirus 2 by RT PCR (hospital order, performed in [facility] hospital lab) Nasopharyngeal Nasopharyngeal Swab     Status: None   Collection Time: 09/05/20  1:16 AM   Specimen: Nasopharyngeal Swab  Result Value Ref Range Status   SARS Coronavirus 2 NEGATIVE NEGATIVE Final    Comment: (NOTE) SARS-CoV-2 target nucleic acids are NOT DETECTED.  The SARS-CoV-2 RNA is generally detectable in upper and lower respiratory specimens during the acute phase of infection. The lowest concentration of SARS-CoV-2 viral copies this assay can detect is 250 copies / mL. A negative result does not preclude SARS-CoV-2 infection and should not be used as the sole basis for treatment or other patient management decisions.  A negative result may occur with improper specimen collection / handling, submission of specimen other than nasopharyngeal swab, presence of viral mutation(s) within the areas targeted by this assay, and inadequate number of viral copies (<250  copies / mL). A negative result must be combined with clinical observations, patient history, and epidemiological information.  Fact Sheet for Patients:   StrictlyIdeas.no  Fact Sheet for Healthcare Providers: BankingDealers.co.za  This test is not yet approved or  cleared by the [place] FDA and has been authorized for detection and/or diagnosis of SARS-CoV-2 by FDA under an Emergency Use Authorization (EUA).  This EUA will remain in effect (meaning this test can be used) for the duration of the COVID-19 declaration under Section 564(b)(1) of the Act, 21 U.S.C. section 360bbb-3(b)(1), unless the authorization is terminated or revoked sooner.  Performed at [facility], [address], [place],  [address]      Management plans discussed with the patient, and she is in agreement.  Spoke with the patient's daughter yesterday about the plan.  I tried to reach her today but left a message.  CODE STATUS:     Code Status Orders  (From admission, onward)         Start     Ordered   09/04/20 2229  Full code  Continuous        09/04/20 2228        Code Status History    Date Active Date Inactive Code Status Order ID Comments User Context   03/17/2017 1827 03/18/2017 1417 Full Code [id number]  Theodoro Grist, MD Inpatient   02/01/2017 1814 02/03/2017 1745 DNR [id number]  Loletha Grayer, MD ED   Advance Care Planning Activity      TOTAL TIME TAKING CARE OF THIS PATIENT: 32 minutes.    Loletha Grayer M.D on  09/07/2020 at 4:44 PM  Between 7am to 6pm - Pager - [phone]  After 6pm go to [email or web address] - password [email or web address]  [facility]  CC: Primary care physician; [name], MD

## 2020-09-07 NOTE — Progress Notes (Signed)
Discharge Note: Reviewed discharge instructions with pt. Pt verbalized understanding. Pt discharged with personal belongings. IV cath removed. Iv cath intact noted. Obtained vitals. Pt wheeled out by staff. Daughter transported pt to home via private vehicle.

## 2020-09-07 NOTE — Discharge Instructions (Signed)
rec mammogram as outpatient

## 2020-09-07 NOTE — Care Management Important Message (Signed)
Important Message  Patient Details  Name: Sara Mcdonald MRN: [id number] Date of Birth: May 29, 1946   Medicare Important Message Given:  Yes     Dannette Barbara 09/07/2020, 10:55 AM

## 2020-09-10 LAB — CULTURE, BLOOD (ROUTINE X 2)
Culture: NO GROWTH
Culture: NO GROWTH
Special Requests: ADEQUATE
Special Requests: ADEQUATE

## 2020-09-11 DIAGNOSIS — N179 Acute kidney failure, unspecified: Secondary | ICD-10-CM | POA: Diagnosis not present

## 2020-09-11 DIAGNOSIS — N3090 Cystitis, unspecified without hematuria: Secondary | ICD-10-CM | POA: Diagnosis not present

## 2020-09-11 DIAGNOSIS — Z23 Encounter for immunization: Secondary | ICD-10-CM | POA: Diagnosis not present

## 2020-09-14 DIAGNOSIS — I2510 Atherosclerotic heart disease of native coronary artery without angina pectoris: Secondary | ICD-10-CM | POA: Diagnosis not present

## 2020-09-14 DIAGNOSIS — E1159 Type 2 diabetes mellitus with other circulatory complications: Secondary | ICD-10-CM | POA: Diagnosis not present

## 2020-09-14 DIAGNOSIS — R0781 Pleurodynia: Secondary | ICD-10-CM | POA: Diagnosis not present

## 2020-09-14 DIAGNOSIS — M797 Fibromyalgia: Secondary | ICD-10-CM | POA: Diagnosis not present

## 2020-09-14 DIAGNOSIS — I700 Atherosclerosis of aorta: Secondary | ICD-10-CM | POA: Diagnosis not present

## 2020-09-14 DIAGNOSIS — J45909 Unspecified asthma, uncomplicated: Secondary | ICD-10-CM | POA: Diagnosis not present

## 2020-09-14 DIAGNOSIS — K5790 Diverticulosis of intestine, part unspecified, without perforation or abscess without bleeding: Secondary | ICD-10-CM | POA: Diagnosis not present

## 2020-09-14 DIAGNOSIS — N183 Chronic kidney disease, stage 3 unspecified: Secondary | ICD-10-CM | POA: Diagnosis not present

## 2020-09-14 DIAGNOSIS — N390 Urinary tract infection, site not specified: Secondary | ICD-10-CM | POA: Diagnosis not present

## 2020-09-14 DIAGNOSIS — G43909 Migraine, unspecified, not intractable, without status migrainosus: Secondary | ICD-10-CM | POA: Diagnosis not present

## 2020-09-14 DIAGNOSIS — E1169 Type 2 diabetes mellitus with other specified complication: Secondary | ICD-10-CM | POA: Diagnosis not present

## 2020-09-14 DIAGNOSIS — M1990 Unspecified osteoarthritis, unspecified site: Secondary | ICD-10-CM | POA: Diagnosis not present

## 2020-09-14 DIAGNOSIS — E785 Hyperlipidemia, unspecified: Secondary | ICD-10-CM | POA: Diagnosis not present

## 2020-09-14 DIAGNOSIS — I129 Hypertensive chronic kidney disease with stage 1 through stage 4 chronic kidney disease, or unspecified chronic kidney disease: Secondary | ICD-10-CM | POA: Diagnosis not present

## 2020-09-14 DIAGNOSIS — M545 Low back pain: Secondary | ICD-10-CM | POA: Diagnosis not present

## 2020-09-14 DIAGNOSIS — Z9181 History of falling: Secondary | ICD-10-CM | POA: Diagnosis not present

## 2020-09-14 DIAGNOSIS — Z7984 Long term (current) use of oral hypoglycemic drugs: Secondary | ICD-10-CM | POA: Diagnosis not present

## 2020-09-14 DIAGNOSIS — H409 Unspecified glaucoma: Secondary | ICD-10-CM | POA: Diagnosis not present

## 2020-09-14 DIAGNOSIS — F418 Other specified anxiety disorders: Secondary | ICD-10-CM | POA: Diagnosis not present

## 2020-09-14 DIAGNOSIS — G4733 Obstructive sleep apnea (adult) (pediatric): Secondary | ICD-10-CM | POA: Diagnosis not present

## 2020-09-17 DIAGNOSIS — N179 Acute kidney failure, unspecified: Secondary | ICD-10-CM | POA: Diagnosis not present

## 2020-09-17 DIAGNOSIS — M5116 Intervertebral disc disorders with radiculopathy, lumbar region: Secondary | ICD-10-CM | POA: Diagnosis not present

## 2020-09-17 DIAGNOSIS — N3090 Cystitis, unspecified without hematuria: Secondary | ICD-10-CM | POA: Diagnosis not present

## 2020-09-17 DIAGNOSIS — Z23 Encounter for immunization: Secondary | ICD-10-CM | POA: Diagnosis not present

## 2020-09-22 DIAGNOSIS — Z7984 Long term (current) use of oral hypoglycemic drugs: Secondary | ICD-10-CM | POA: Diagnosis not present

## 2020-09-22 DIAGNOSIS — E1169 Type 2 diabetes mellitus with other specified complication: Secondary | ICD-10-CM | POA: Diagnosis not present

## 2020-09-22 DIAGNOSIS — I700 Atherosclerosis of aorta: Secondary | ICD-10-CM | POA: Diagnosis not present

## 2020-09-22 DIAGNOSIS — Z9181 History of falling: Secondary | ICD-10-CM | POA: Diagnosis not present

## 2020-09-22 DIAGNOSIS — M545 Low back pain, unspecified: Secondary | ICD-10-CM | POA: Diagnosis not present

## 2020-09-22 DIAGNOSIS — K5790 Diverticulosis of intestine, part unspecified, without perforation or abscess without bleeding: Secondary | ICD-10-CM | POA: Diagnosis not present

## 2020-09-22 DIAGNOSIS — H409 Unspecified glaucoma: Secondary | ICD-10-CM | POA: Diagnosis not present

## 2020-09-22 DIAGNOSIS — M797 Fibromyalgia: Secondary | ICD-10-CM | POA: Diagnosis not present

## 2020-09-22 DIAGNOSIS — M1990 Unspecified osteoarthritis, unspecified site: Secondary | ICD-10-CM | POA: Diagnosis not present

## 2020-09-22 DIAGNOSIS — G4733 Obstructive sleep apnea (adult) (pediatric): Secondary | ICD-10-CM | POA: Diagnosis not present

## 2020-09-22 DIAGNOSIS — I129 Hypertensive chronic kidney disease with stage 1 through stage 4 chronic kidney disease, or unspecified chronic kidney disease: Secondary | ICD-10-CM | POA: Diagnosis not present

## 2020-09-22 DIAGNOSIS — N183 Chronic kidney disease, stage 3 unspecified: Secondary | ICD-10-CM | POA: Diagnosis not present

## 2020-09-22 DIAGNOSIS — I2510 Atherosclerotic heart disease of native coronary artery without angina pectoris: Secondary | ICD-10-CM | POA: Diagnosis not present

## 2020-09-22 DIAGNOSIS — G43909 Migraine, unspecified, not intractable, without status migrainosus: Secondary | ICD-10-CM | POA: Diagnosis not present

## 2020-09-22 DIAGNOSIS — E1159 Type 2 diabetes mellitus with other circulatory complications: Secondary | ICD-10-CM | POA: Diagnosis not present

## 2020-09-22 DIAGNOSIS — E785 Hyperlipidemia, unspecified: Secondary | ICD-10-CM | POA: Diagnosis not present

## 2020-09-22 DIAGNOSIS — N390 Urinary tract infection, site not specified: Secondary | ICD-10-CM | POA: Diagnosis not present

## 2020-09-22 DIAGNOSIS — F418 Other specified anxiety disorders: Secondary | ICD-10-CM | POA: Diagnosis not present

## 2020-09-22 DIAGNOSIS — J45909 Unspecified asthma, uncomplicated: Secondary | ICD-10-CM | POA: Diagnosis not present

## 2020-09-22 DIAGNOSIS — R0781 Pleurodynia: Secondary | ICD-10-CM | POA: Diagnosis not present

## 2020-09-25 DIAGNOSIS — N183 Chronic kidney disease, stage 3 unspecified: Secondary | ICD-10-CM | POA: Diagnosis not present

## 2020-09-25 DIAGNOSIS — N390 Urinary tract infection, site not specified: Secondary | ICD-10-CM | POA: Diagnosis not present

## 2020-09-25 DIAGNOSIS — I129 Hypertensive chronic kidney disease with stage 1 through stage 4 chronic kidney disease, or unspecified chronic kidney disease: Secondary | ICD-10-CM | POA: Diagnosis not present

## 2020-09-25 DIAGNOSIS — E1159 Type 2 diabetes mellitus with other circulatory complications: Secondary | ICD-10-CM | POA: Diagnosis not present

## 2020-09-25 DIAGNOSIS — E785 Hyperlipidemia, unspecified: Secondary | ICD-10-CM | POA: Diagnosis not present

## 2020-09-25 DIAGNOSIS — I2510 Atherosclerotic heart disease of native coronary artery without angina pectoris: Secondary | ICD-10-CM | POA: Diagnosis not present

## 2020-09-25 DIAGNOSIS — R0781 Pleurodynia: Secondary | ICD-10-CM | POA: Diagnosis not present

## 2020-09-28 ENCOUNTER — Other Ambulatory Visit: Payer: Self-pay | Admitting: Internal Medicine

## 2020-09-28 DIAGNOSIS — Z1231 Encounter for screening mammogram for malignant neoplasm of breast: Secondary | ICD-10-CM

## 2020-10-19 DIAGNOSIS — I129 Hypertensive chronic kidney disease with stage 1 through stage 4 chronic kidney disease, or unspecified chronic kidney disease: Secondary | ICD-10-CM | POA: Diagnosis not present

## 2020-10-19 DIAGNOSIS — I2510 Atherosclerotic heart disease of native coronary artery without angina pectoris: Secondary | ICD-10-CM | POA: Diagnosis not present

## 2020-10-19 DIAGNOSIS — J45909 Unspecified asthma, uncomplicated: Secondary | ICD-10-CM | POA: Diagnosis not present

## 2020-10-19 DIAGNOSIS — G4733 Obstructive sleep apnea (adult) (pediatric): Secondary | ICD-10-CM | POA: Diagnosis not present

## 2020-10-19 DIAGNOSIS — I700 Atherosclerosis of aorta: Secondary | ICD-10-CM | POA: Diagnosis not present

## 2020-10-19 DIAGNOSIS — F418 Other specified anxiety disorders: Secondary | ICD-10-CM | POA: Diagnosis not present

## 2020-10-19 DIAGNOSIS — N183 Chronic kidney disease, stage 3 unspecified: Secondary | ICD-10-CM | POA: Diagnosis not present

## 2020-10-19 DIAGNOSIS — E785 Hyperlipidemia, unspecified: Secondary | ICD-10-CM | POA: Diagnosis not present

## 2020-10-19 DIAGNOSIS — E1169 Type 2 diabetes mellitus with other specified complication: Secondary | ICD-10-CM | POA: Diagnosis not present

## 2020-10-19 DIAGNOSIS — Z9181 History of falling: Secondary | ICD-10-CM | POA: Diagnosis not present

## 2020-10-19 DIAGNOSIS — Z7984 Long term (current) use of oral hypoglycemic drugs: Secondary | ICD-10-CM | POA: Diagnosis not present

## 2020-10-19 DIAGNOSIS — M1990 Unspecified osteoarthritis, unspecified site: Secondary | ICD-10-CM | POA: Diagnosis not present

## 2020-10-19 DIAGNOSIS — G43909 Migraine, unspecified, not intractable, without status migrainosus: Secondary | ICD-10-CM | POA: Diagnosis not present

## 2020-10-19 DIAGNOSIS — H409 Unspecified glaucoma: Secondary | ICD-10-CM | POA: Diagnosis not present

## 2020-10-19 DIAGNOSIS — E1159 Type 2 diabetes mellitus with other circulatory complications: Secondary | ICD-10-CM | POA: Diagnosis not present

## 2020-10-19 DIAGNOSIS — M545 Low back pain, unspecified: Secondary | ICD-10-CM | POA: Diagnosis not present

## 2020-10-19 DIAGNOSIS — N390 Urinary tract infection, site not specified: Secondary | ICD-10-CM | POA: Diagnosis not present

## 2020-10-19 DIAGNOSIS — R0781 Pleurodynia: Secondary | ICD-10-CM | POA: Diagnosis not present

## 2020-10-19 DIAGNOSIS — K5790 Diverticulosis of intestine, part unspecified, without perforation or abscess without bleeding: Secondary | ICD-10-CM | POA: Diagnosis not present

## 2020-10-19 DIAGNOSIS — M797 Fibromyalgia: Secondary | ICD-10-CM | POA: Diagnosis not present

## 2020-10-20 ENCOUNTER — Ambulatory Visit
Admission: RE | Admit: 2020-10-20 | Discharge: 2020-10-20 | Disposition: A | Discharge status: Home / Self Care | Payer: PPO | Source: Ambulatory Visit | Attending: Internal Medicine | Admitting: Internal Medicine

## 2020-10-20 ENCOUNTER — Other Ambulatory Visit: Payer: Self-pay

## 2020-10-20 DIAGNOSIS — Z1231 Encounter for screening mammogram for malignant neoplasm of breast: Secondary | ICD-10-CM | POA: Insufficient documentation

## 2020-10-20 IMAGING — MG DIGITAL SCREENING BILAT W/ TOMO W/ CAD
6 of 12 series · 6 of 36 positions shown · non-contrast
Comparison: Previous exam(s).

CLINICAL DATA: Screening.

EXAM:
DIGITAL SCREENING BILATERAL MAMMOGRAM WITH TOMO AND CAD

[L MLO synth-2D]
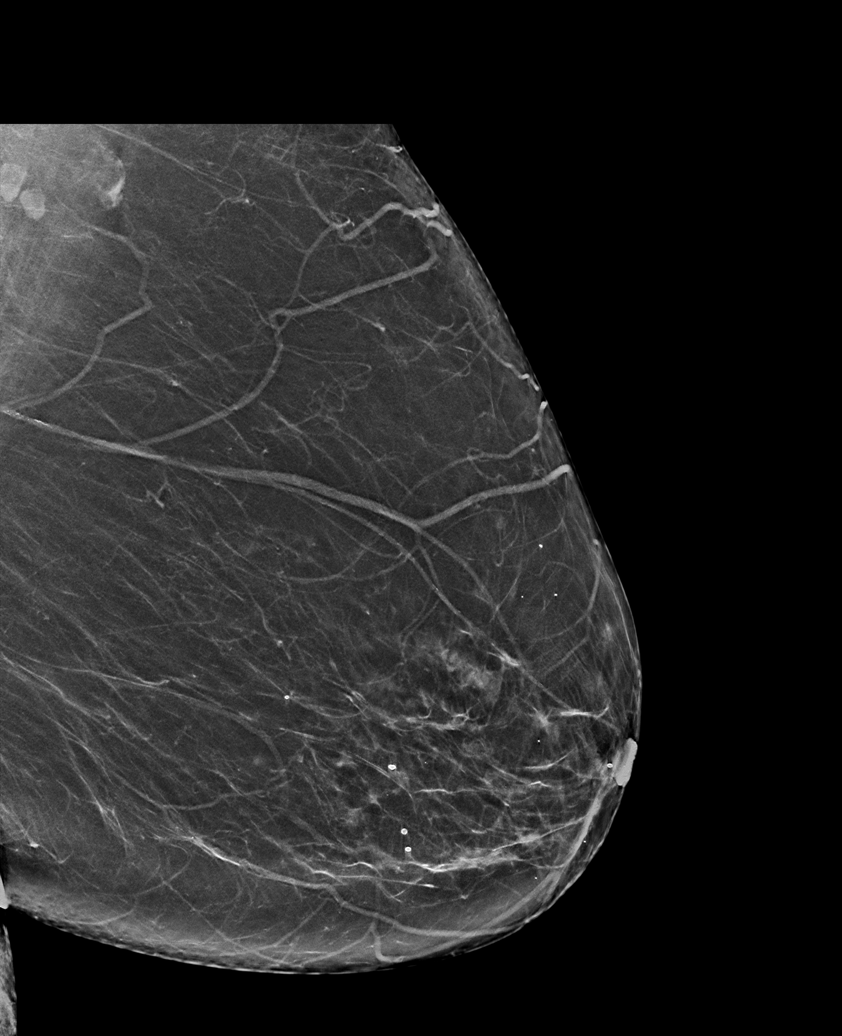

[L CV synth-2D]
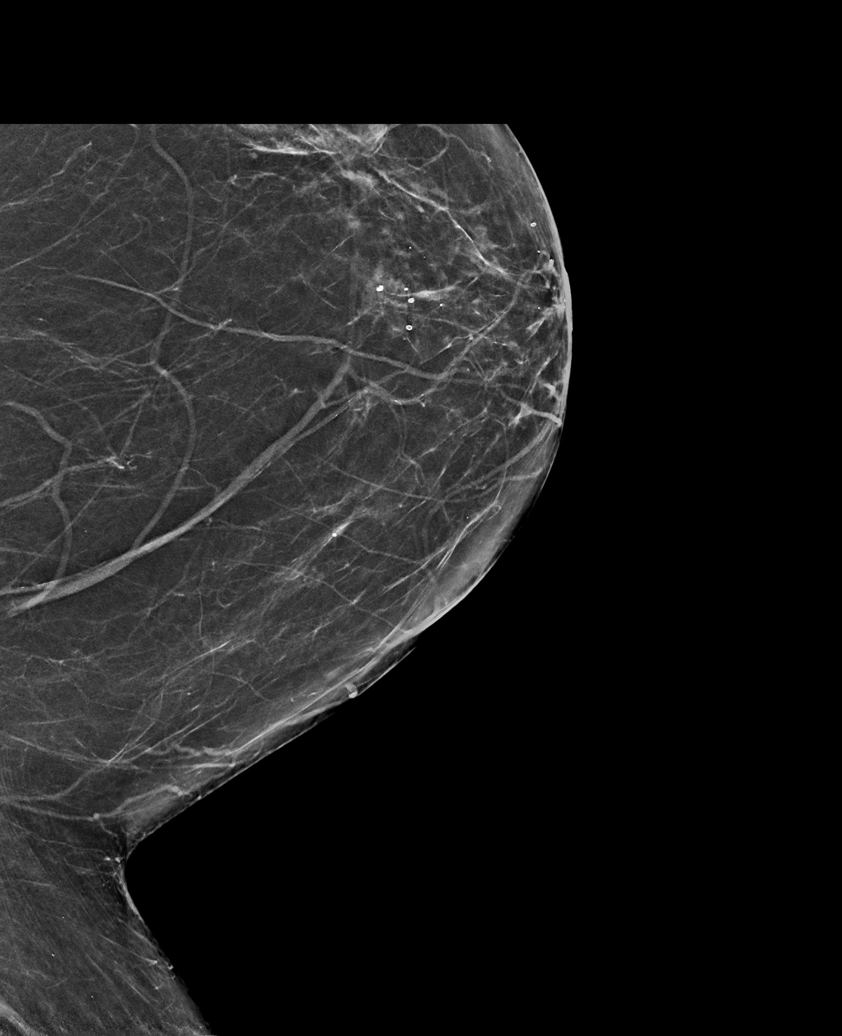

[R MLO synth-2D (1 of 2)]
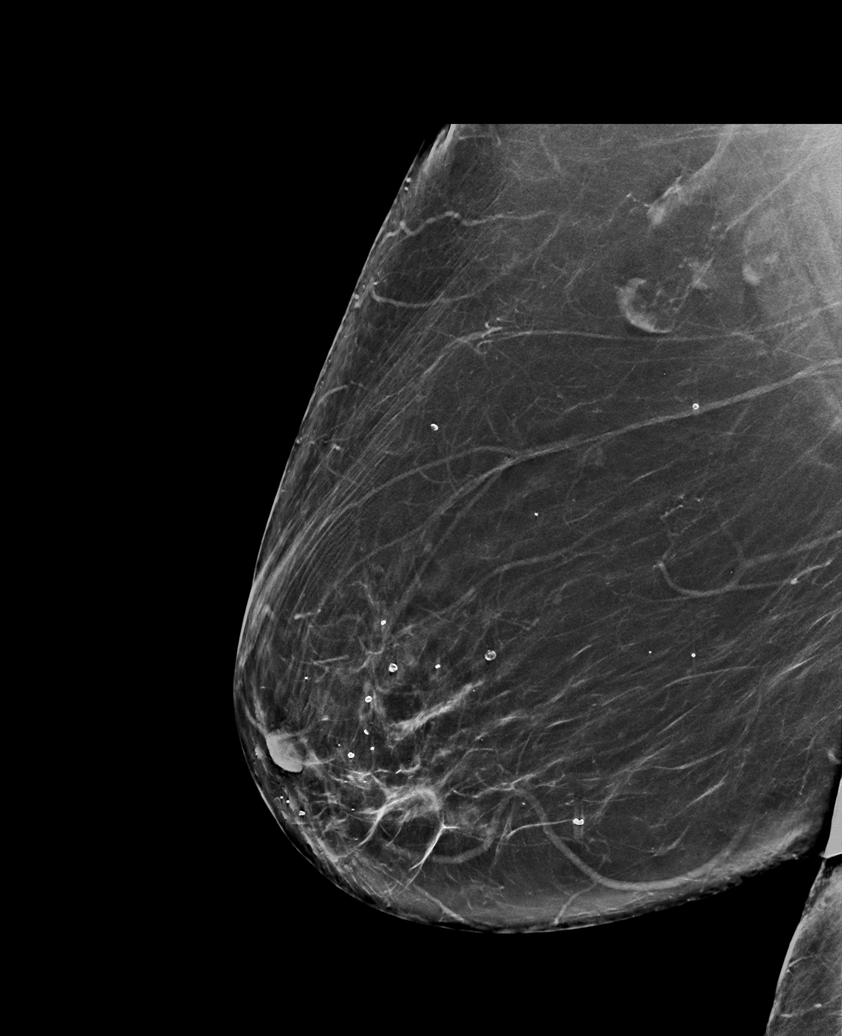

[R MLO synth-2D (2 of 2)]
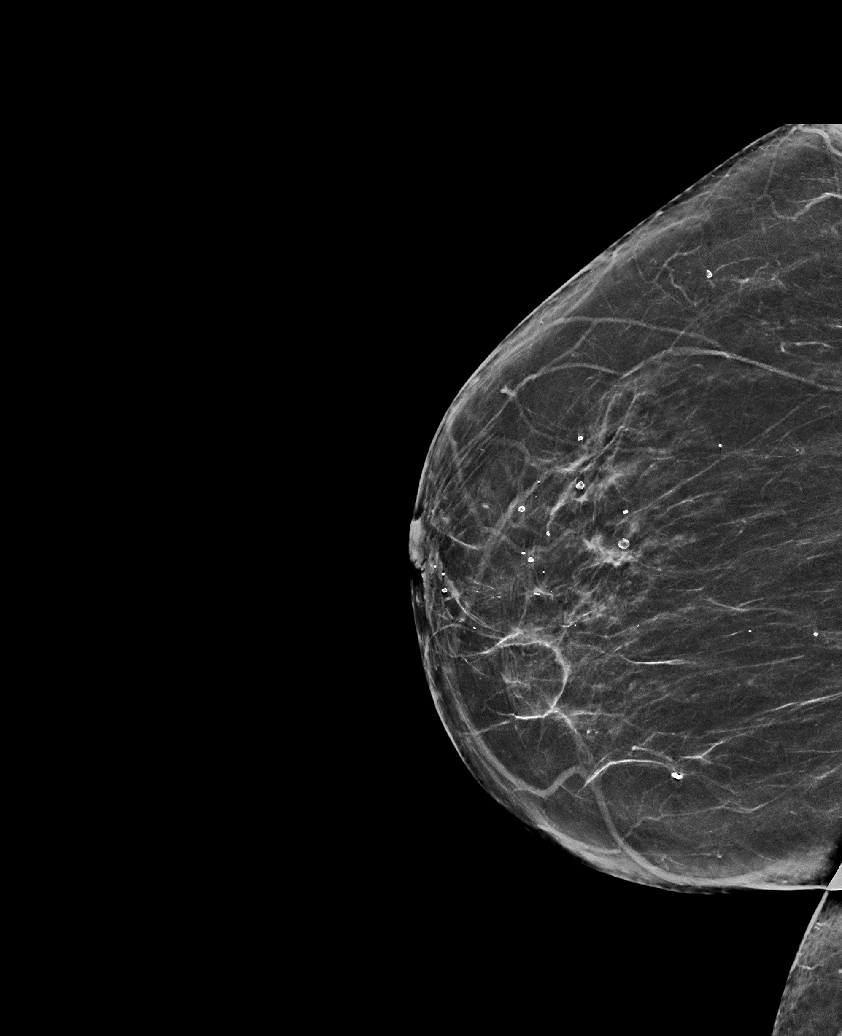

[L CC synth-2D]
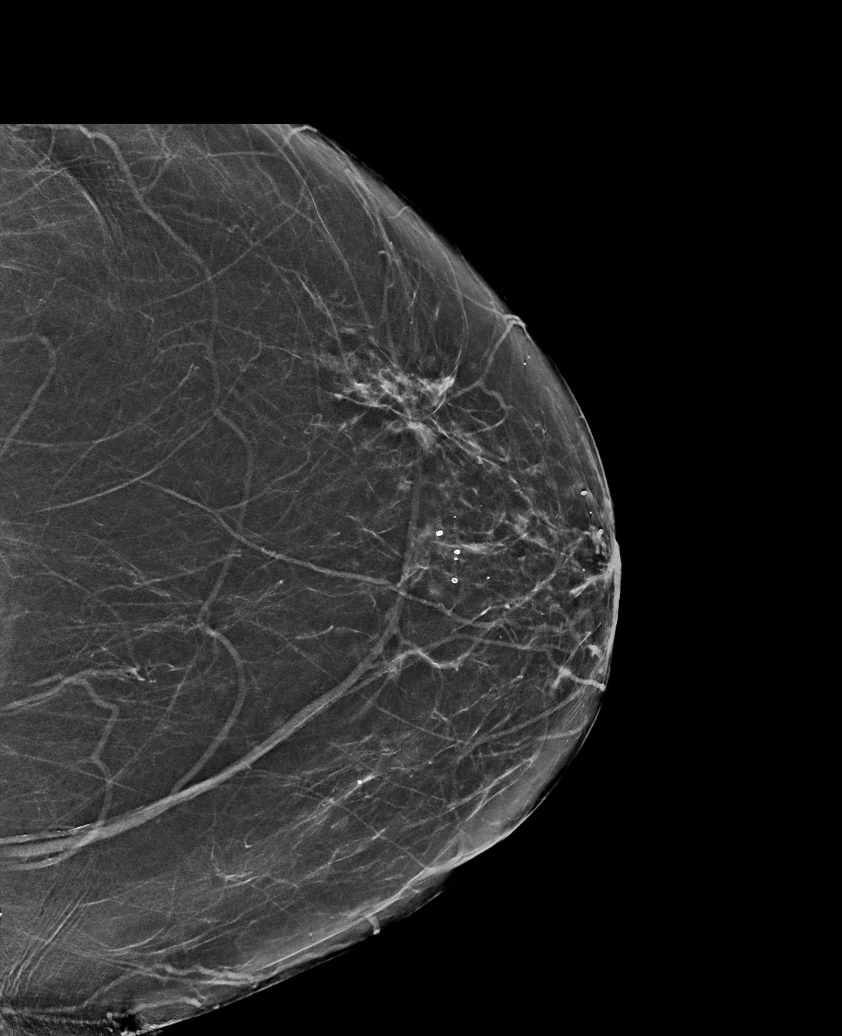

[R CC synth-2D]
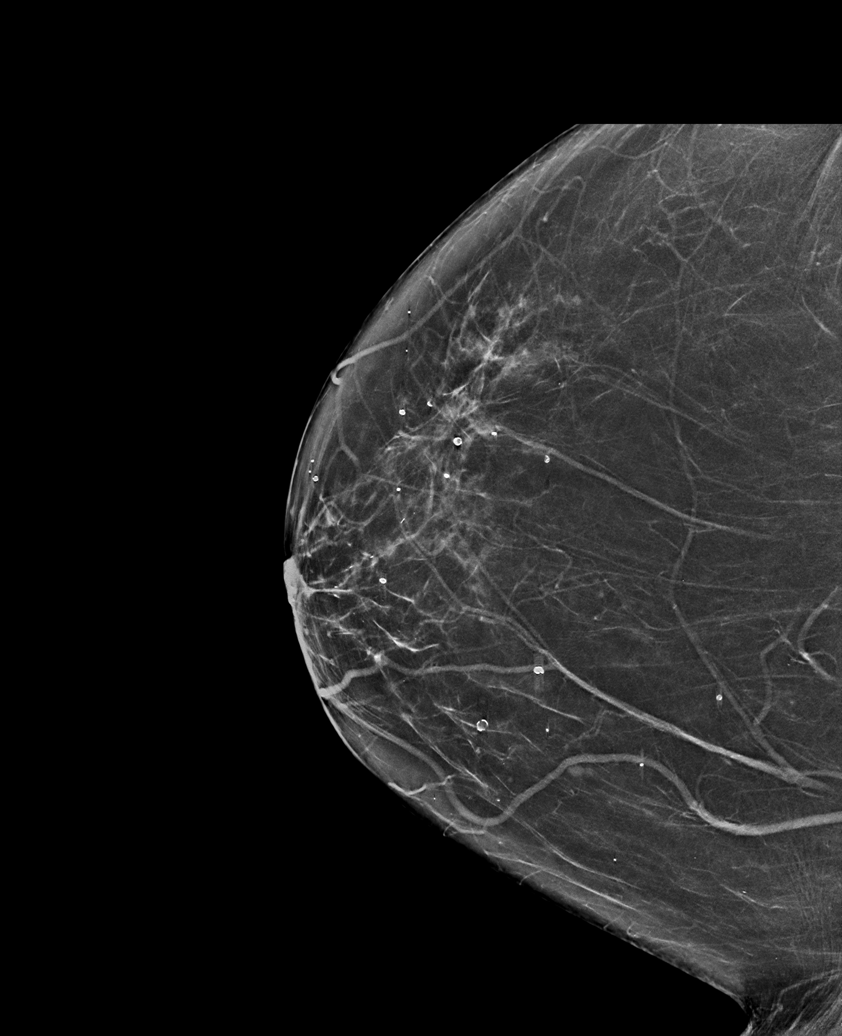

[6 of 36 positions shown; findings below may reference images not displayed]

ACR Breast Density Category b: There are scattered areas of
fibroglandular density.
FINDINGS: There are no findings suspicious for malignancy. Images were
processed with CAD.
IMPRESSION: No mammographic evidence of malignancy. A result letter of this
screening mammogram will be mailed directly to the patient.

RECOMMENDATION:
Screening mammogram in one year. (Code:[TQ])

BI-RADS CATEGORY  1: Negative.

## 2020-11-18 DIAGNOSIS — E782 Mixed hyperlipidemia: Secondary | ICD-10-CM | POA: Diagnosis not present

## 2020-11-18 DIAGNOSIS — E1169 Type 2 diabetes mellitus with other specified complication: Secondary | ICD-10-CM | POA: Diagnosis not present

## 2020-11-24 DIAGNOSIS — E782 Mixed hyperlipidemia: Secondary | ICD-10-CM | POA: Diagnosis not present

## 2020-11-24 DIAGNOSIS — E1169 Type 2 diabetes mellitus with other specified complication: Secondary | ICD-10-CM | POA: Diagnosis not present

## 2020-11-24 DIAGNOSIS — M5116 Intervertebral disc disorders with radiculopathy, lumbar region: Secondary | ICD-10-CM | POA: Diagnosis not present

## 2020-11-24 DIAGNOSIS — N1832 Chronic kidney disease, stage 3b: Secondary | ICD-10-CM | POA: Diagnosis not present

## 2021-01-26 ENCOUNTER — Ambulatory Visit
Admission: RE | Admit: 2021-01-26 | Discharge: 2021-01-26 | Disposition: A | Discharge status: Home / Self Care | Payer: PPO | Source: Ambulatory Visit | Attending: Family Medicine | Admitting: Family Medicine

## 2021-01-26 ENCOUNTER — Other Ambulatory Visit: Payer: Self-pay

## 2021-01-26 ENCOUNTER — Other Ambulatory Visit: Payer: Self-pay | Admitting: Family Medicine

## 2021-01-26 DIAGNOSIS — R140 Abdominal distension (gaseous): Secondary | ICD-10-CM

## 2021-01-26 DIAGNOSIS — R1031 Right lower quadrant pain: Secondary | ICD-10-CM | POA: Diagnosis not present

## 2021-01-26 DIAGNOSIS — R197 Diarrhea, unspecified: Secondary | ICD-10-CM | POA: Diagnosis not present

## 2021-01-26 DIAGNOSIS — R399 Unspecified symptoms and signs involving the genitourinary system: Secondary | ICD-10-CM | POA: Diagnosis not present

## 2021-01-26 DIAGNOSIS — K5730 Diverticulosis of large intestine without perforation or abscess without bleeding: Secondary | ICD-10-CM | POA: Diagnosis not present

## 2021-01-26 DIAGNOSIS — R1011 Right upper quadrant pain: Secondary | ICD-10-CM | POA: Diagnosis not present

## 2021-01-26 DIAGNOSIS — G8929 Other chronic pain: Secondary | ICD-10-CM

## 2021-01-26 DIAGNOSIS — R110 Nausea: Secondary | ICD-10-CM | POA: Diagnosis not present

## 2021-01-26 DIAGNOSIS — K429 Umbilical hernia without obstruction or gangrene: Secondary | ICD-10-CM | POA: Diagnosis not present

## 2021-01-26 DIAGNOSIS — I700 Atherosclerosis of aorta: Secondary | ICD-10-CM | POA: Diagnosis not present

## 2021-01-26 DIAGNOSIS — R109 Unspecified abdominal pain: Secondary | ICD-10-CM | POA: Insufficient documentation

## 2021-01-26 IMAGING — CT CT ABD-PELV W/O CM
2 of 4 series · 16 of 46 positions shown, 18 images · non-contrast
Comparison: [DATE]

CLINICAL DATA: Unspecified abdominal pain, abdominal bloating,
nausea

EXAM:
CT ABDOMEN AND PELVIS WITHOUT CONTRAST
TECHNIQUE: Multidetector CT imaging of the abdomen and pelvis was performed
following the standard protocol without IV contrast.

[Series 2: routine abd/pel wo · axial · 0.90mm/px · z∈[-487,-67]mm · 13 of 93 slices shown, 15 images]
[im 5/93  soft-tissue]
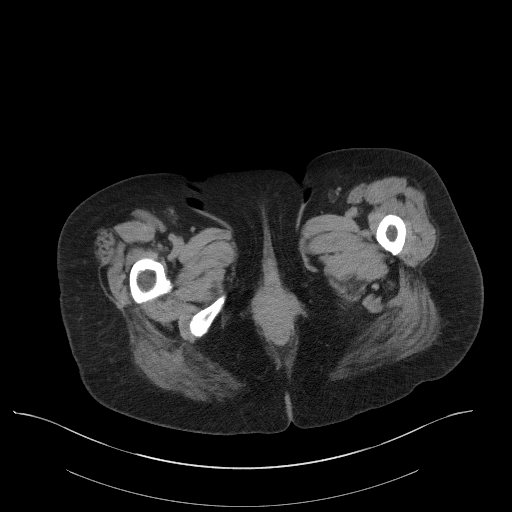
[im 5/93  bone]
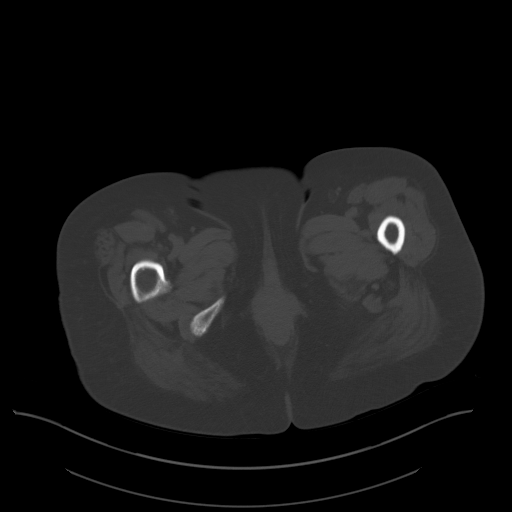
[im 13/93  soft-tissue]
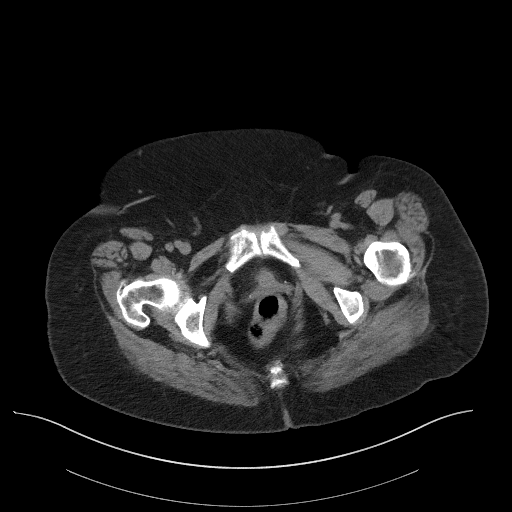
[im 21/93  soft-tissue]
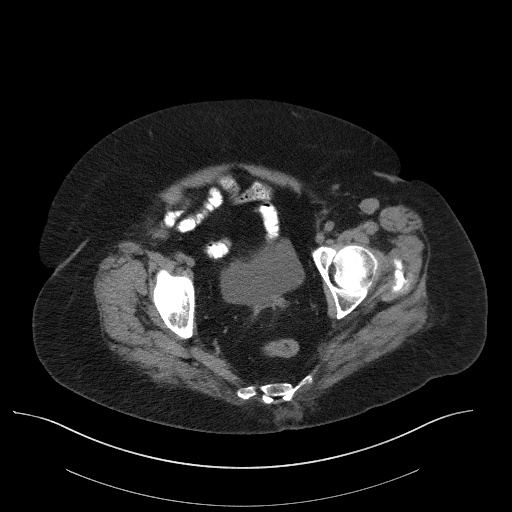
[im 25/93  soft-tissue]
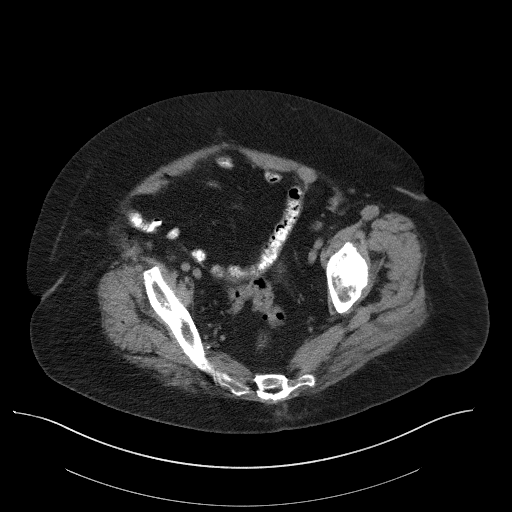
[im 33/93  soft-tissue]
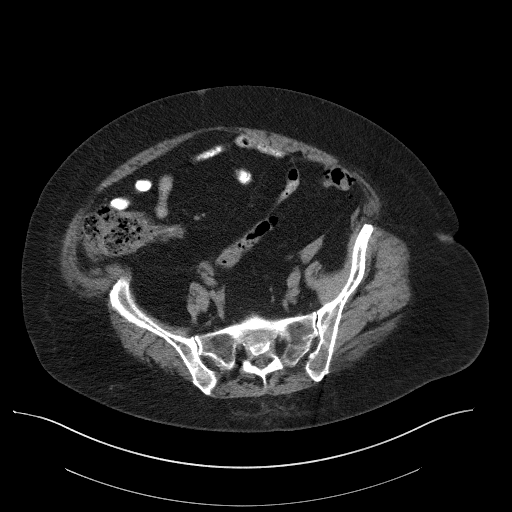
[im 41/93  soft-tissue]
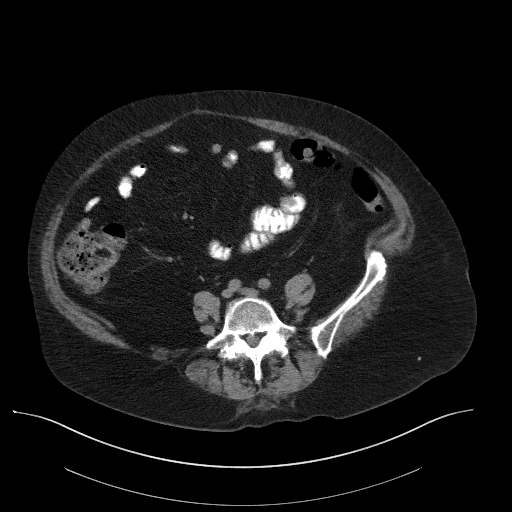
[im 49/93  soft-tissue]
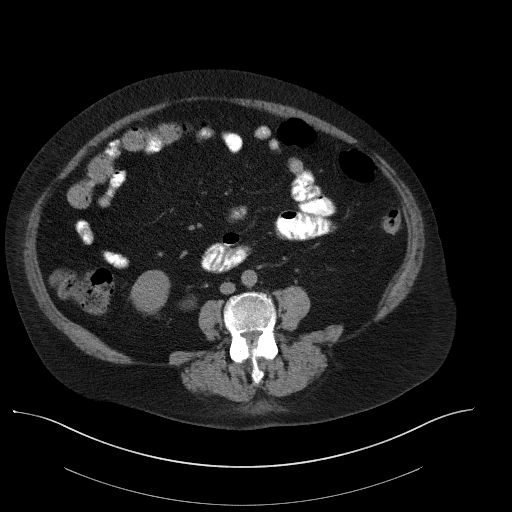
[im 53/93  soft-tissue]
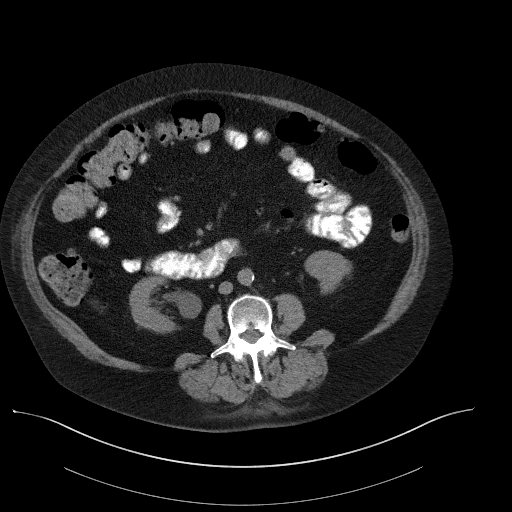
[im 61/93  soft-tissue]
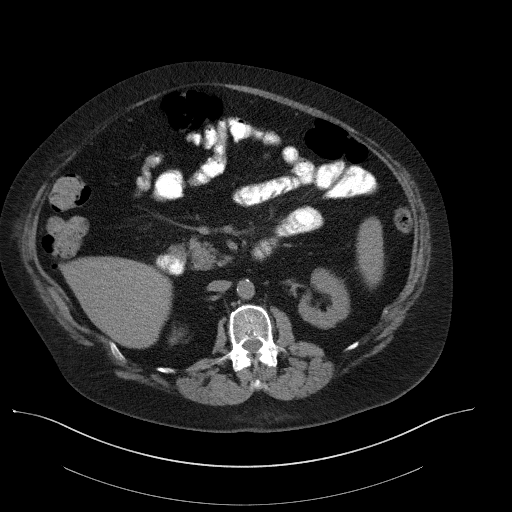
[im 61/93  bone]
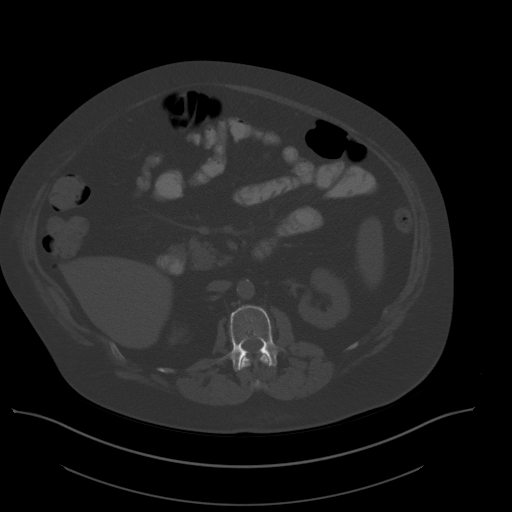
[im 69/93  soft-tissue]
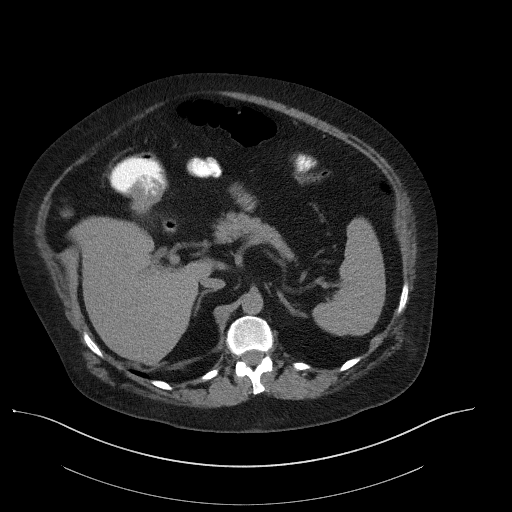
[im 73/93  soft-tissue]
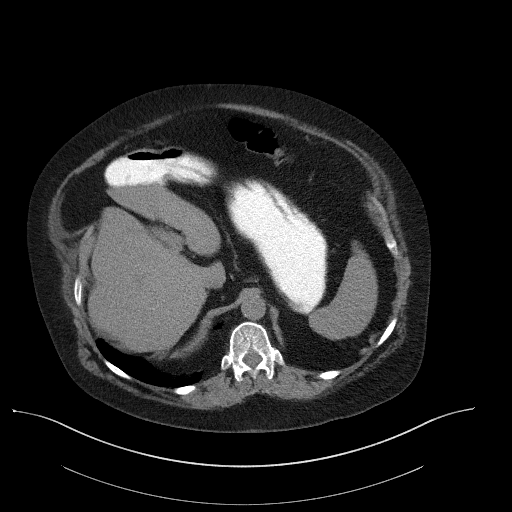
[im 81/93  soft-tissue]
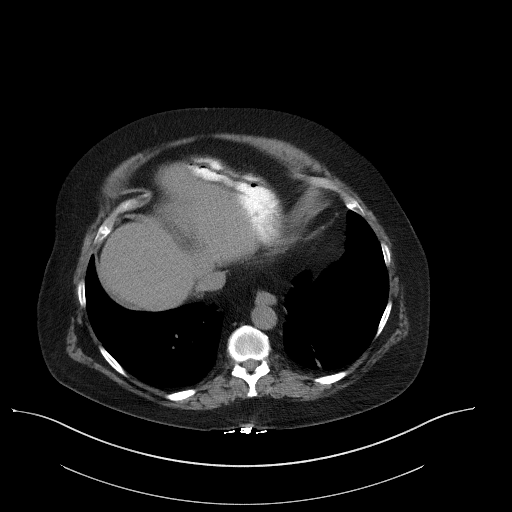
[im 89/93  soft-tissue]
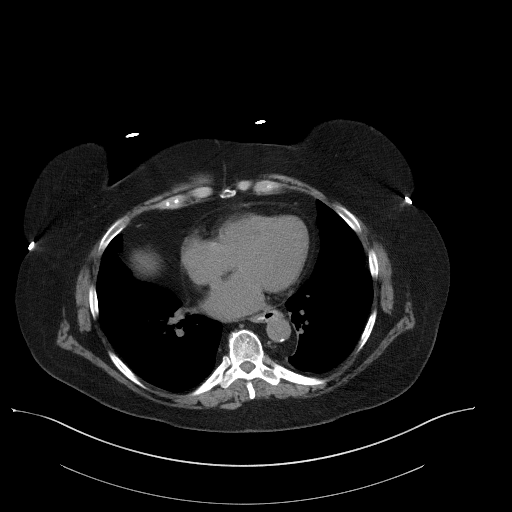

[Series 5: coronal st · coronal · 0.87mm/px · 3 of 108 slices shown]
[im 36/108  soft-tissue]
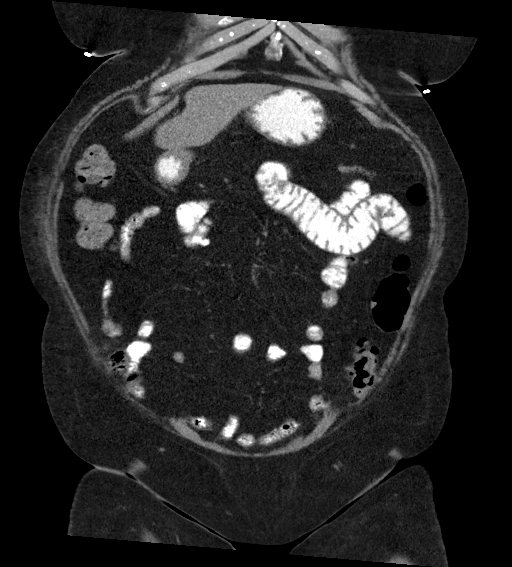
[im 48/108  soft-tissue]
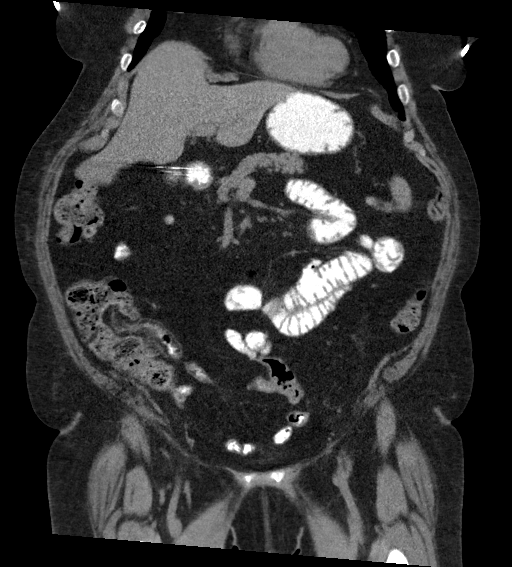
[im 60/108  soft-tissue]
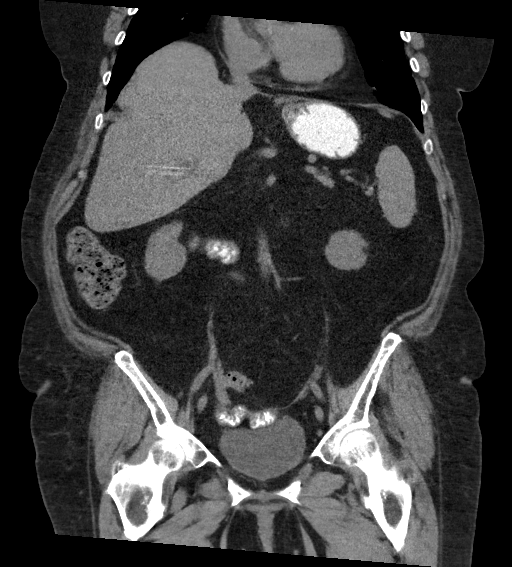

[16 of 46 positions shown; findings below may reference images not displayed]

FINDINGS: Lower chest: The visualized lung bases are clear for save for mild
discoid atelectasis. The visualized heart and pericardium are
unremarkable.

Hepatobiliary: No focal liver abnormality is seen. Status post
cholecystectomy. No biliary dilatation.

Pancreas: Unremarkable

Spleen: Unremarkable

Adrenals/Urinary Tract: Adrenal glands are unremarkable. Kidneys are
normal, without renal calculi, focal lesion, or hydronephrosis.
Bladder is unremarkable.

Stomach/Bowel: Moderate sigmoid and mild descending colonic
diverticulosis. The stomach, small bowel, and large bowel are
otherwise unremarkable. No evidence of obstruction or focal
inflammation. The appendix is absent. No free intraperitoneal gas or
fluid.

Vascular/Lymphatic: Mild atherosclerotic calcification within the
abdominal aorta. No aortic aneurysm. No pathologic adenopathy within
the abdomen and pelvis.

Reproductive: Status post hysterectomy. No adnexal masses.

Other: Tiny broad-based umbilical hernia.  Rectum unremarkable.

Musculoskeletal: No acute bone abnormality. No lytic or blastic bone
lesion.
IMPRESSION: No acute intra-abdominal pathology identified. No radiographic
explanation for the patient's reported symptoms.

Moderate sigmoid diverticulosis.

Aortic Atherosclerosis ([JT]-[JT]).

## 2021-02-11 DIAGNOSIS — R109 Unspecified abdominal pain: Secondary | ICD-10-CM | POA: Diagnosis not present

## 2021-02-11 DIAGNOSIS — I517 Cardiomegaly: Secondary | ICD-10-CM | POA: Diagnosis not present

## 2021-02-11 DIAGNOSIS — R1012 Left upper quadrant pain: Secondary | ICD-10-CM | POA: Diagnosis not present

## 2021-02-11 DIAGNOSIS — R110 Nausea: Secondary | ICD-10-CM | POA: Diagnosis not present

## 2021-05-03 ENCOUNTER — Other Ambulatory Visit: Payer: Self-pay | Admitting: Family Medicine

## 2021-05-03 ENCOUNTER — Other Ambulatory Visit: Payer: Self-pay

## 2021-05-03 DIAGNOSIS — M5416 Radiculopathy, lumbar region: Secondary | ICD-10-CM | POA: Diagnosis not present

## 2021-05-03 DIAGNOSIS — M4807 Spinal stenosis, lumbosacral region: Secondary | ICD-10-CM | POA: Diagnosis not present

## 2021-05-03 DIAGNOSIS — M5136 Other intervertebral disc degeneration, lumbar region: Secondary | ICD-10-CM | POA: Diagnosis not present

## 2021-05-06 ENCOUNTER — Ambulatory Visit
Admission: RE | Admit: 2021-05-06 | Discharge: 2021-05-06 | Disposition: A | Discharge status: Home / Self Care | Payer: PPO | Source: Ambulatory Visit | Attending: Family Medicine | Admitting: Family Medicine

## 2021-05-06 ENCOUNTER — Other Ambulatory Visit: Payer: Self-pay

## 2021-05-06 DIAGNOSIS — M4807 Spinal stenosis, lumbosacral region: Secondary | ICD-10-CM | POA: Diagnosis not present

## 2021-05-06 DIAGNOSIS — M545 Low back pain, unspecified: Secondary | ICD-10-CM | POA: Diagnosis not present

## 2021-05-06 IMAGING — MR MR LUMBAR SPINE W/O CM
4 of 5 series · 25 of 48 positions shown · non-contrast
Comparison: CT abdomen/pelvis [DATE].

CLINICAL DATA: Stenosis of lumbosacral spine. Additional history
provided by scanning technologist: Patient reports chronic low back
pain. Steroid injections. Progressive pain.

EXAM:
MRI LUMBAR SPINE WITHOUT CONTRAST
TECHNIQUE: Multiplanar, multisequence MR imaging of the lumbar spine was
performed. No intravenous contrast was administered.

[Series 2: T2 · sagittal · 4.0mm · 0.81mm/px · 6 of 15 slices shown (1 of 2)]
[im 1/15]
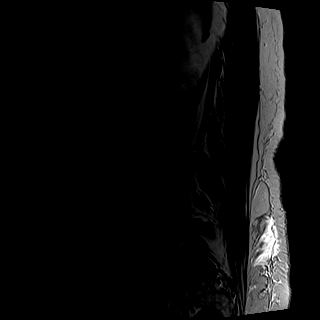
[im 3/15]
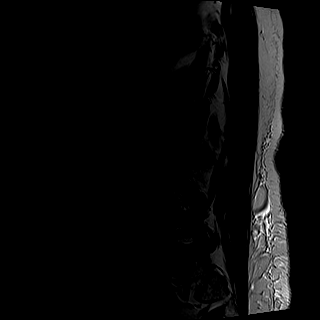
[im 6/15]
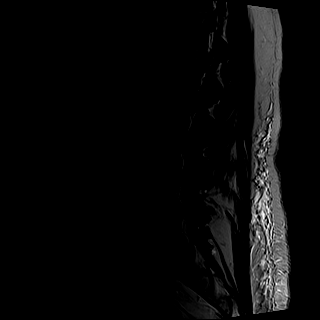
[im 9/15]
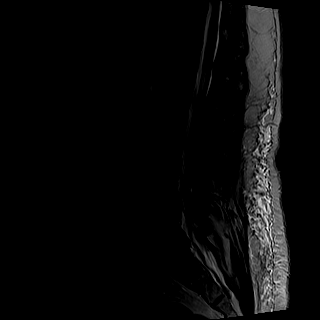
[im 12/15]
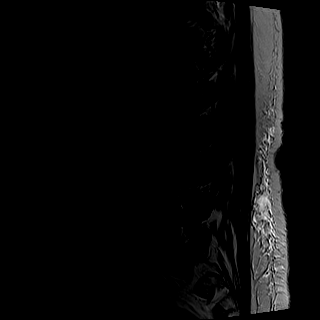
[im 15/15]
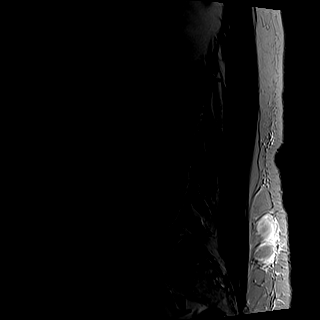

[Series 3: T1 · sagittal · 4.0mm · 0.41mm/px · 6 of 15 slices shown (1 of 2)]
[im 1/15]
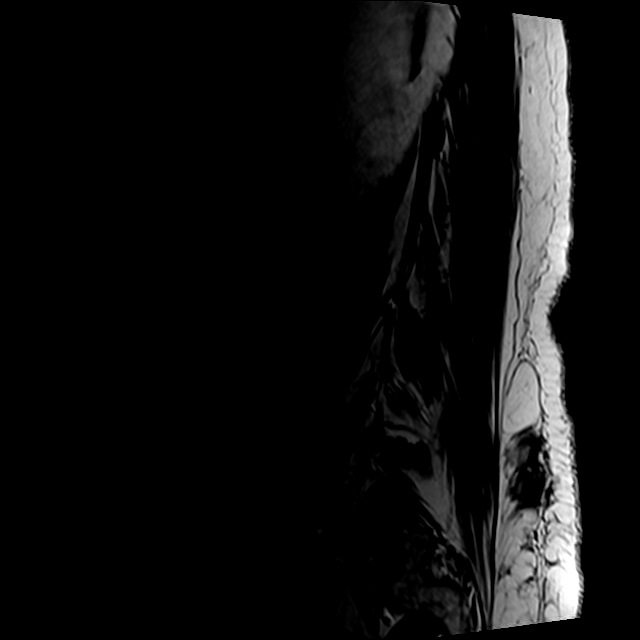
[im 3/15]
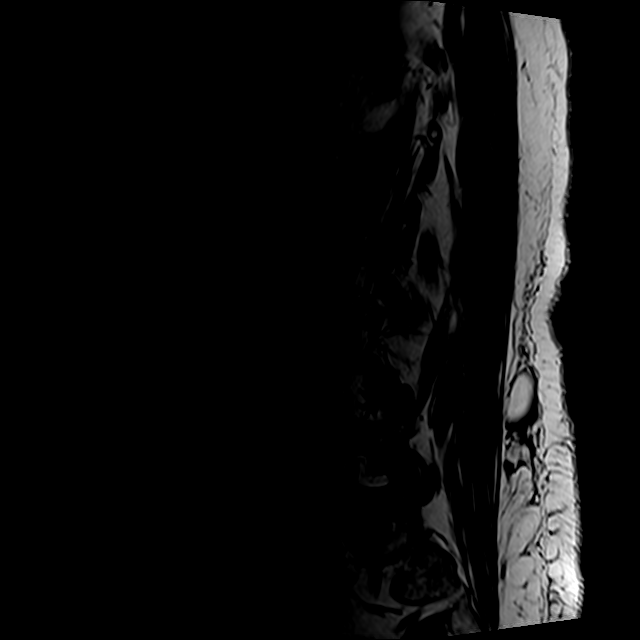
[im 6/15]
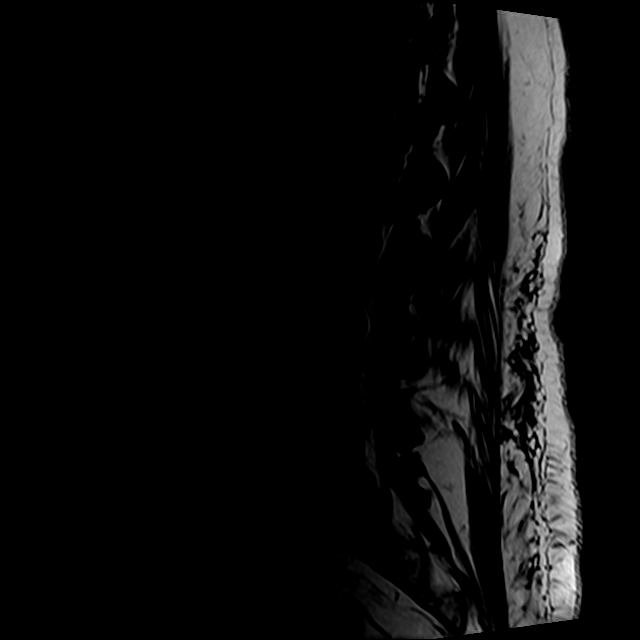
[im 9/15]
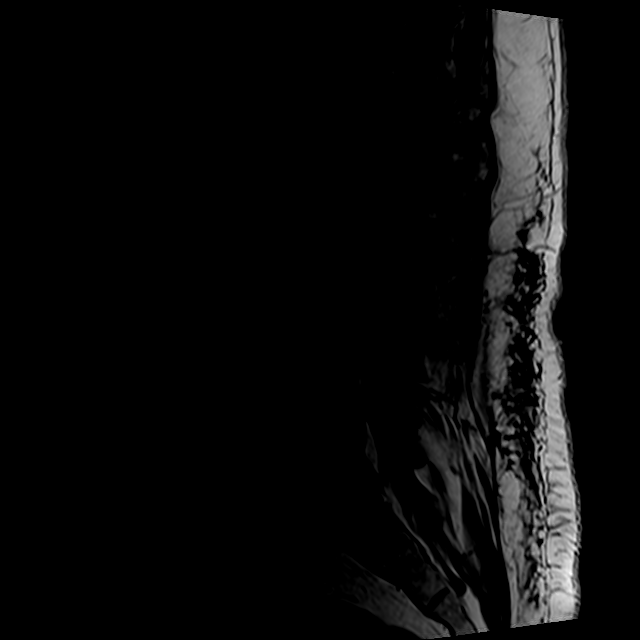
[im 12/15]
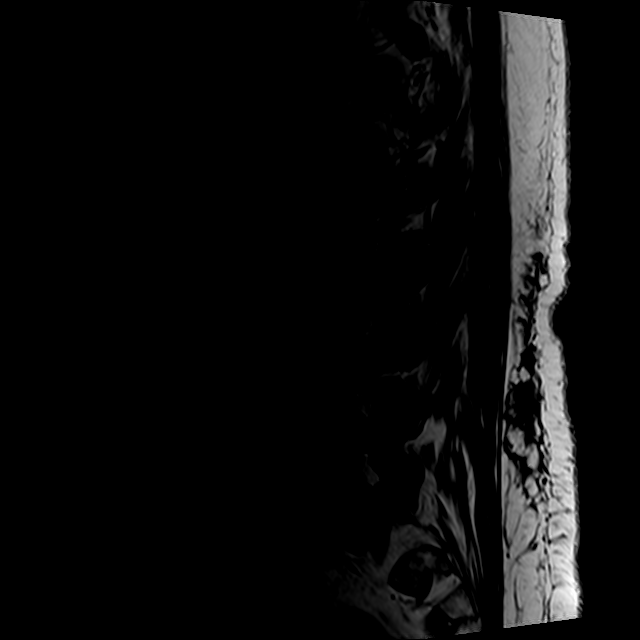
[im 15/15]
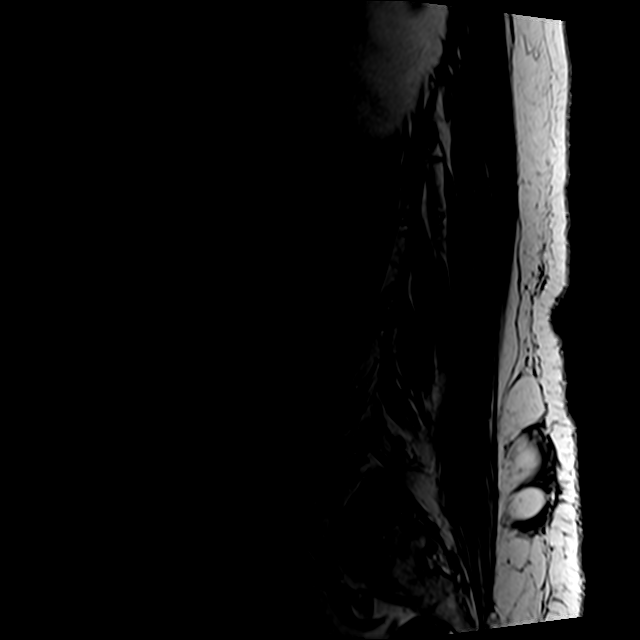

[Series 5: T2 · axial · 4.0mm · 0.78mm/px · z∈[-102,+111]mm · 9 of 39 slices shown (2 of 2)]
[im 1/39]
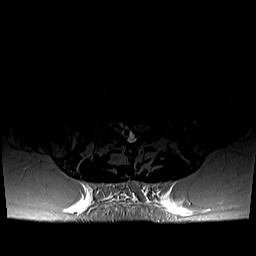
[im 6/39]
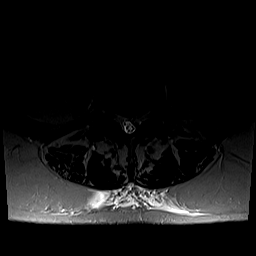
[im 11/39]
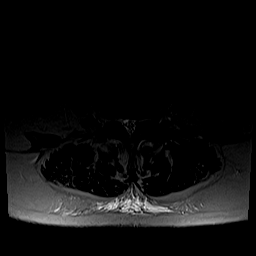
[im 17/39]
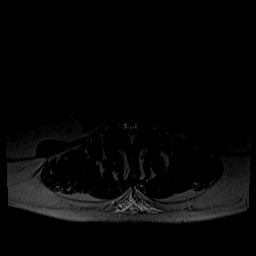
[im 20/39]
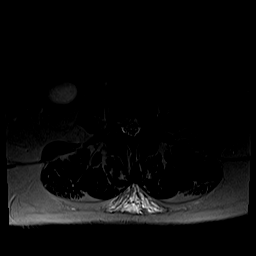
[im 22/39]
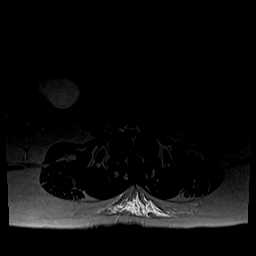
[im 28/39]
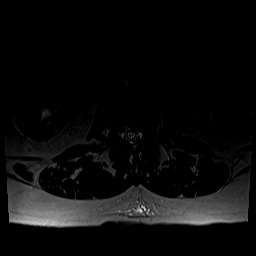
[im 33/39]
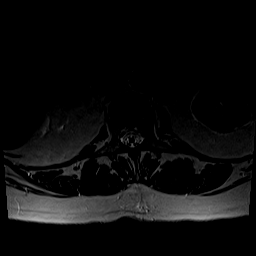
[im 39/39]
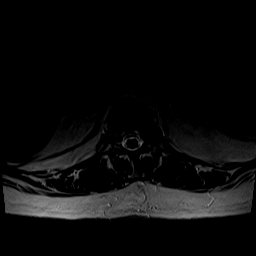

[Series 6: T1 · axial · 4.0mm · 0.39mm/px · z∈[-102,+81]mm · 4 of 39 slices shown (2 of 2)]
[im 1/39]
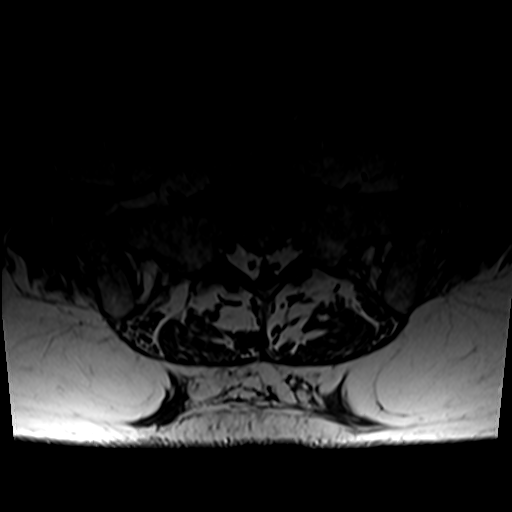
[im 6/39]
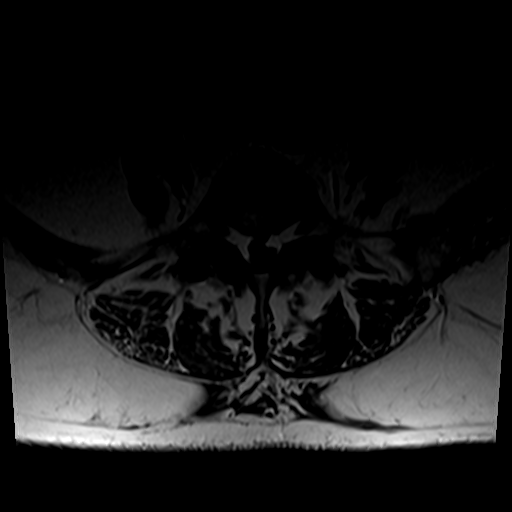
[im 20/39]
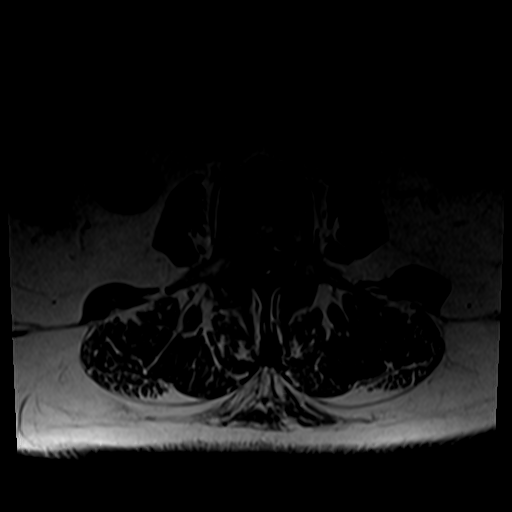
[im 33/39]
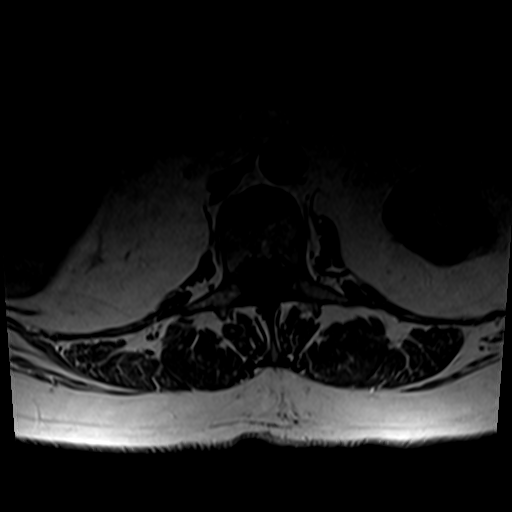

[25 of 48 positions shown; findings below may reference images not displayed]

FINDINGS: Segmentation: 5 lumbar vertebrae. The caudal most well-formed
intervertebral disc space is designated L5-S1.

Alignment:  Trace L3-L4, L4-L5 and L5-S1 grade 1 anterolisthesis.

Vertebrae: Vertebral body height is maintained. No significant
marrow edema or focal suspicious osseous lesion.

Conus medullaris and cauda equina: Conus extends to the L1-L2 level.
No signal abnormality within the visualized distal spinal cord.

Paraspinal and other soft tissues: Right extrarenal pelvis.
Nonspecific edema signal within the dorsal subcutaneous fat
overlying the lumbar and sacral spine.

Disc levels:

Mild disc degeneration throughout the lumbar spine.

Congenitally narrow lumbar spinal canal on the basis of short
pedicles.

T11-T12: Imaged sagittally. No significant disc herniation or
degenerative stenosis.

T12-L1: Mild facet arthrosis. No significant disc herniation or
degenerative stenosis.

L1-L2: Mild facet arthrosis. No significant disc herniation or
degenerative stenosis.

L2-L3: Small disc bulge. Moderate facet arthrosis. No significant
spinal canal or foraminal stenosis.

L3-L4: Trace grade 1 anterolisthesis. Disc uncovering with small
disc bulge. Moderate/advanced facet arthrosis. Mild ligamentum
flavum hypertrophy. Mild left subarticular narrowing without
appreciable nerve root impingement. No significant degenerative
central canal stenosis. Mild bilateral neural foraminal narrowing
(greater on the left).

L4-L5: Trace grade 1 anterolisthesis. Disc uncovering with disc
bulge. Advanced facet arthrosis with ligamentum flavum hypertrophy.
Moderate to moderately severe central canal stenosis. Left greater
than right subarticular narrowing with potential to affect the
descending left L5 nerve root. No significant foraminal stenosis.

L5-S1: Trace grade 1 anterolisthesis. Minimal disc uncovering.
Moderate facet arthrosis. Trace fluid within the right facet joint.
No significant degenerative spinal canal stenosis or foraminal
narrowing.
IMPRESSION: Lumbar spondylosis, as outlined and with findings most notably as
follows.

At L4-L5, there is multifactorial moderate to moderately severe
central canal stenosis. Left greater than right subarticular
narrowing with potential to affect the descending left L5 nerve
root.

At L3-L4, moderate/severe facet arthrosis contributes to
multifactorial mild left subarticular narrowing (without appreciable
nerve root impingement). Mild bilateral neural foraminal narrowing.

No significant degenerative spinal canal or foraminal stenosis at
the remaining levels.

## 2021-05-12 DIAGNOSIS — M5136 Other intervertebral disc degeneration, lumbar region: Secondary | ICD-10-CM | POA: Diagnosis not present

## 2021-05-12 DIAGNOSIS — M5416 Radiculopathy, lumbar region: Secondary | ICD-10-CM | POA: Diagnosis not present

## 2021-05-12 DIAGNOSIS — M4807 Spinal stenosis, lumbosacral region: Secondary | ICD-10-CM | POA: Diagnosis not present

## 2021-05-20 DIAGNOSIS — E782 Mixed hyperlipidemia: Secondary | ICD-10-CM | POA: Diagnosis not present

## 2021-05-20 DIAGNOSIS — E1169 Type 2 diabetes mellitus with other specified complication: Secondary | ICD-10-CM | POA: Diagnosis not present

## 2021-05-21 DIAGNOSIS — M48062 Spinal stenosis, lumbar region with neurogenic claudication: Secondary | ICD-10-CM | POA: Diagnosis not present

## 2021-05-21 DIAGNOSIS — M5416 Radiculopathy, lumbar region: Secondary | ICD-10-CM | POA: Diagnosis not present

## 2021-05-25 DIAGNOSIS — F330 Major depressive disorder, recurrent, mild: Secondary | ICD-10-CM | POA: Diagnosis not present

## 2021-05-25 DIAGNOSIS — E1169 Type 2 diabetes mellitus with other specified complication: Secondary | ICD-10-CM | POA: Diagnosis not present

## 2021-05-25 DIAGNOSIS — Z0000 Encounter for general adult medical examination without abnormal findings: Secondary | ICD-10-CM | POA: Diagnosis not present

## 2021-05-25 DIAGNOSIS — E782 Mixed hyperlipidemia: Secondary | ICD-10-CM | POA: Diagnosis not present

## 2021-05-25 DIAGNOSIS — G4733 Obstructive sleep apnea (adult) (pediatric): Secondary | ICD-10-CM | POA: Diagnosis not present

## 2021-05-25 DIAGNOSIS — N1832 Chronic kidney disease, stage 3b: Secondary | ICD-10-CM | POA: Diagnosis not present

## 2021-05-25 DIAGNOSIS — Z9989 Dependence on other enabling machines and devices: Secondary | ICD-10-CM | POA: Diagnosis not present

## 2021-06-25 DIAGNOSIS — M4726 Other spondylosis with radiculopathy, lumbar region: Secondary | ICD-10-CM | POA: Diagnosis not present

## 2021-07-28 DIAGNOSIS — N1832 Chronic kidney disease, stage 3b: Secondary | ICD-10-CM | POA: Diagnosis not present

## 2021-07-29 DIAGNOSIS — M47816 Spondylosis without myelopathy or radiculopathy, lumbar region: Secondary | ICD-10-CM | POA: Diagnosis not present

## 2021-08-09 DIAGNOSIS — M5416 Radiculopathy, lumbar region: Secondary | ICD-10-CM | POA: Diagnosis not present

## 2021-08-09 DIAGNOSIS — M5136 Other intervertebral disc degeneration, lumbar region: Secondary | ICD-10-CM | POA: Diagnosis not present

## 2021-08-09 DIAGNOSIS — M47816 Spondylosis without myelopathy or radiculopathy, lumbar region: Secondary | ICD-10-CM | POA: Diagnosis not present

## 2021-08-09 DIAGNOSIS — M48062 Spinal stenosis, lumbar region with neurogenic claudication: Secondary | ICD-10-CM | POA: Diagnosis not present

## 2021-08-25 DIAGNOSIS — M48062 Spinal stenosis, lumbar region with neurogenic claudication: Secondary | ICD-10-CM | POA: Diagnosis not present

## 2021-08-25 DIAGNOSIS — M5416 Radiculopathy, lumbar region: Secondary | ICD-10-CM | POA: Diagnosis not present

## 2021-09-03 DIAGNOSIS — J40 Bronchitis, not specified as acute or chronic: Secondary | ICD-10-CM | POA: Diagnosis not present

## 2021-09-03 DIAGNOSIS — I517 Cardiomegaly: Secondary | ICD-10-CM | POA: Diagnosis not present

## 2021-09-03 DIAGNOSIS — R059 Cough, unspecified: Secondary | ICD-10-CM | POA: Diagnosis not present

## 2021-09-03 DIAGNOSIS — R053 Chronic cough: Secondary | ICD-10-CM | POA: Diagnosis not present

## 2021-09-03 DIAGNOSIS — M5116 Intervertebral disc disorders with radiculopathy, lumbar region: Secondary | ICD-10-CM | POA: Diagnosis not present

## 2021-09-14 DIAGNOSIS — M5136 Other intervertebral disc degeneration, lumbar region: Secondary | ICD-10-CM | POA: Diagnosis not present

## 2021-09-14 DIAGNOSIS — M5416 Radiculopathy, lumbar region: Secondary | ICD-10-CM | POA: Diagnosis not present

## 2021-09-14 DIAGNOSIS — M4726 Other spondylosis with radiculopathy, lumbar region: Secondary | ICD-10-CM | POA: Diagnosis not present

## 2021-09-14 DIAGNOSIS — M4807 Spinal stenosis, lumbosacral region: Secondary | ICD-10-CM | POA: Diagnosis not present

## 2021-09-14 DIAGNOSIS — M5489 Other dorsalgia: Secondary | ICD-10-CM | POA: Diagnosis not present

## 2021-11-17 ENCOUNTER — Encounter: Payer: Self-pay | Admitting: Student in an Organized Health Care Education/Training Program

## 2021-11-17 ENCOUNTER — Ambulatory Visit: Payer: PPO | Admitting: Student in an Organized Health Care Education/Training Program

## 2021-11-17 ENCOUNTER — Other Ambulatory Visit: Payer: Self-pay

## 2021-11-17 ENCOUNTER — Ambulatory Visit
Admission: RE | Admit: 2021-11-17 | Discharge: 2021-11-17 | Disposition: A | Discharge status: Home / Self Care | Payer: PPO | Source: Ambulatory Visit | Attending: Student in an Organized Health Care Education/Training Program | Admitting: Student in an Organized Health Care Education/Training Program

## 2021-11-17 VITALS — BP 120/67 | HR 115 | Temp 97.1°F | Resp 16 | Ht 59.0 in | Wt 182.0 lb

## 2021-11-17 DIAGNOSIS — M5136 Other intervertebral disc degeneration, lumbar region: Secondary | ICD-10-CM | POA: Insufficient documentation

## 2021-11-17 DIAGNOSIS — M47816 Spondylosis without myelopathy or radiculopathy, lumbar region: Secondary | ICD-10-CM | POA: Insufficient documentation

## 2021-11-17 DIAGNOSIS — G894 Chronic pain syndrome: Secondary | ICD-10-CM | POA: Diagnosis not present

## 2021-11-17 DIAGNOSIS — M51369 Other intervertebral disc degeneration, lumbar region without mention of lumbar back pain or lower extremity pain: Secondary | ICD-10-CM | POA: Insufficient documentation

## 2021-11-17 NOTE — Progress Notes (Signed)
Safety precautions to be maintained throughout the outpatient stay will include: orient to surroundings, keep bed in low position, maintain call bell within reach at all times, provide assistance with transfer out of bed and ambulation.  

## 2021-11-17 NOTE — Progress Notes (Signed)
Patient: Sara Mcdonald  Service Category: E/M  Provider: Gillis Santa, MD  DOB: 07/06/1946  DOS: 11/17/2021  Referring Provider: Deetta Perla, MD  MRN: [id number]  Setting: Ambulatory outpatient  PCP: [name], MD  Type: New Patient  Specialty: Interventional Pain Management    Location: Office  Delivery: Face-to-face     Primary Reason(s) for Visit: Encounter for initial evaluation of one or more chronic problems (new to examiner) potentially causing chronic pain, and posing a threat to normal musculoskeletal function. (Level of risk: High) CC: Back Pain (Mid to lumbar bilateral ) and Fibromyalgia  HPI  Sara Mcdonald is a 75 y.o. year old, female patient, who comes for the first time to our practice referred by Deetta Perla, MD for our initial evaluation of her chronic pain. She has Acute respiratory failure with hypoxia ([place]); Sepsis ([place]); Community acquired pneumonia; COPD with acute exacerbation ([place]); Hypoxia; Hyponatremia; Acute on chronic renal insufficiency; Pneumonia; Severe sepsis with acute organ dysfunction ([place]); Diabetes mellitus without complication ([place]); Hypertension associated with diabetes ([place]); Acute kidney injury superimposed on CKD ([place]); Acute lower UTI; Anxiety and depression; OSA (obstructive sleep apnea); Type 2 diabetes mellitus with hyperlipidemia ([place]); Acute metabolic encephalopathy; Moderate persistent asthma; Acute bronchitis; Weakness; Generalized abdominal pain; Sinus tachycardia; Lumbar spondylosis; Lumbar degenerative disc disease; and Chronic pain syndrome on their problem list. Today she comes in for evaluation of her Back Pain (Mid to lumbar bilateral ) and Fibromyalgia  Pain Assessment: Location: Mid, Lower, Right, Left Back Radiating: back pain down the right leg to just above the knee Onset: More than a month ago Duration: Chronic pain Quality: Discomfort, Constant, Stabbing, Aching, Sharp Severity: 7 /10 (subjective, self-reported pain score)   Effect on ADL: activity is very limited.  has to take frequent breaks Timing: Constant Modifying factors: heating pad, TENS unit helps some but does not relieve it all the way BP: 120/67  HR: (!) 115  Onset and Duration: Gradual and Present longer than 3 months Cause of pain: Unknown Severity: Getting worse, NAS-11 at its worse: 10/10, NAS-11 at its best: 5/10, NAS-11 now: 7/10, and NAS-11 on the average: 7/10 Timing: Morning, Afternoon, Night, and During activity or exercise Aggravating Factors: Bending, Kneeling, Lifiting, Motion, Prolonged standing, and Walking Alleviating Factors: Hot packs, Lying down, and TENS Associated Problems: Constipation, Depression, Weakness, and Pain that wakes patient up Quality of Pain: Agonizing, Disabling, and Sharp Previous Examinations or Tests: MRI scan, Nerve block, and Neurosurgical evaluation Previous Treatments: TENS  Sara Mcdonald is a pleasant 75 year old female who is being referred by Dr. Lacinda Axon for consideration of spinal cord stimulator.  Of note she has a history of low back pain with occasional radiation to her right buttock.  She states that the majority of her pain is in her lower back and does not radiate.  She has been seen by physical medicine and rehab at [facility]  She has received trigger point injections in the past with limited response.  She has tried Tylenol, etodolac with limited response.  NSAIDs cause gastritis.  Of note she has had lumbar epidural steroid injections with no more than 3 days of pain relief.  She has history of type 2 diabetes and chronic kidney disease.  Meds   Current Outpatient Medications:    acetaminophen-codeine (TYLENOL #3) 300-30 MG tablet, Take by mouth every 4 (four) hours as needed for moderate pain., Disp: , Rfl:    ALPRAZolam (XANAX) 0.5 MG tablet, Take 0.5 mg by mouth 2 (two) times  daily as needed for anxiety or sleep. , Disp: , Rfl: 5   cholecalciferol (VITAMIN D3) 25 MCG (1000 UNIT) tablet, Take  1,000 Units by mouth daily., Disp: , Rfl:    metFORMIN (GLUCOPHAGE) 500 MG tablet, Take 500 mg by mouth daily. , Disp: , Rfl:    Multiple Vitamin (MULTIVITAMIN WITH MINERALS) TABS tablet, Take 1 tablet by mouth daily., Disp: , Rfl:    nystatin-triamcinolone (MYCOLOG II) cream, Apply 1 application topically 2 (two) times daily., Disp: , Rfl:    olmesartan (BENICAR) 20 MG tablet, Take 20 mg by mouth daily., Disp: , Rfl:    pantoprazole (PROTONIX) 40 MG tablet, Take 40 mg by mouth 2 (two) times daily. , Disp: , Rfl:    polyethylene glycol powder (GLYCOLAX/MIRALAX) powder, Take 17 g by mouth daily. , Disp: , Rfl:    pravastatin (PRAVACHOL) 80 MG tablet, Take 80 mg by mouth daily., Disp: , Rfl:    promethazine (PHENERGAN) 25 MG suppository, Place 25 mg rectally every 6 (six) hours as needed for nausea or vomiting., Disp: , Rfl:    promethazine (PHENERGAN) 25 MG tablet, Take 25 mg by mouth every 6 (six) hours as needed for nausea or vomiting., Disp: , Rfl:    topiramate (TOPAMAX) 25 MG tablet, Take 75 mg by mouth at bedtime. , Disp: , Rfl:    traZODone (DESYREL) 100 MG tablet, Take 100 mg by mouth at bedtime., Disp: , Rfl:    triamterene-hydrochlorothiazide (MAXZIDE-25) 37.5-25 MG tablet, Take 1 tablet by mouth daily., Disp: , Rfl:    venlafaxine XR (EFFEXOR-XR) 75 MG 24 hr capsule, Take 75 mg by mouth daily., Disp: , Rfl:   Imaging Review   Lumbosacral Imaging: Lumbar MR wo contrast: Results for orders placed during the hospital encounter of 05/06/21  MR LUMBAR SPINE WO CONTRAST  Narrative CLINICAL DATA:  Stenosis of lumbosacral spine. Additional history provided by scanning technologist: Patient reports chronic low back pain. Steroid injections. Progressive pain.  EXAM: MRI LUMBAR SPINE WITHOUT CONTRAST  TECHNIQUE: Multiplanar, multisequence MR imaging of the lumbar spine was performed. No intravenous contrast was administered.  COMPARISON:  CT abdomen/pelvis  01/26/2021.  FINDINGS: Segmentation: 5 lumbar vertebrae. The caudal most well-formed intervertebral disc space is designated L5-S1.  Alignment:  Trace L3-L4, L4-L5 and L5-S1 grade 1 anterolisthesis.  Vertebrae: Vertebral body height is maintained. No significant marrow edema or focal suspicious osseous lesion.  Conus medullaris and cauda equina: Conus extends to the L1-L2 level. No signal abnormality within the visualized distal spinal cord.  Paraspinal and other soft tissues: Right extrarenal pelvis. Nonspecific edema signal within the dorsal subcutaneous fat overlying the lumbar and sacral spine.  Disc levels:  Mild disc degeneration throughout the lumbar spine.  Congenitally narrow lumbar spinal canal on the basis of short pedicles.  T11-T12: Imaged sagittally. No significant disc herniation or degenerative stenosis.  T12-L1: Mild facet arthrosis. No significant disc herniation or degenerative stenosis.  L1-L2: Mild facet arthrosis. No significant disc herniation or degenerative stenosis.  L2-L3: Small disc bulge. Moderate facet arthrosis. No significant spinal canal or foraminal stenosis.  L3-L4: Trace grade 1 anterolisthesis. Disc uncovering with small disc bulge. Moderate/advanced facet arthrosis. Mild ligamentum flavum hypertrophy. Mild left subarticular narrowing without appreciable nerve root impingement. No significant degenerative central canal stenosis. Mild bilateral neural foraminal narrowing (greater on the left).  L4-L5: Trace grade 1 anterolisthesis. Disc uncovering with disc bulge. Advanced facet arthrosis with ligamentum flavum hypertrophy. Moderate to moderately severe central canal stenosis. Left greater than   right subarticular narrowing with potential to affect the descending left L5 nerve root. No significant foraminal stenosis.  L5-S1: Trace grade 1 anterolisthesis. Minimal disc uncovering. Moderate facet arthrosis. Trace fluid within the  right facet joint. No significant degenerative spinal canal stenosis or foraminal narrowing.  IMPRESSION: Lumbar spondylosis, as outlined and with findings most notably as follows.  At L4-L5, there is multifactorial moderate to moderately severe central canal stenosis. Left greater than right subarticular narrowing with potential to affect the descending left L5 nerve root.  At L3-L4, moderate/severe facet arthrosis contributes to multifactorial mild left subarticular narrowing (without appreciable nerve root impingement). Mild bilateral neural foraminal narrowing.  No significant degenerative spinal canal or foraminal stenosis at the remaining levels.   Electronically Signed By: Kellie Simmering DO On: 05/07/2021 15:56  Ankle Imaging: Ankle-R DG Complete: Results for orders placed during the hospital encounter of 12/17/19  DG Ankle Complete Right  Narrative CLINICAL DATA:  Fall, ankle pain  EXAM: RIGHT ANKLE - COMPLETE 3+ VIEW  COMPARISON:  None.  FINDINGS: Alignment is anatomic. Possible nondisplaced cortical avulsion fracture of the distal fibula. Joint spaces are preserved.  IMPRESSION: Possible nondisplaced cortical avulsion fracture of the distal right fibula.   Electronically Signed By: Macy Mis M.D. On: 12/17/2019 08:11  Ankle-L DG Complete: Results for orders placed during the hospital encounter of 12/17/19  DG Ankle Complete Left  Narrative CLINICAL DATA:  Fall, bilateral ankle pain  EXAM: LEFT ANKLE COMPLETE - 3+ VIEW  COMPARISON:  None.  FINDINGS: Mild lateral soft tissue swelling. No acute bony abnormality. Specifically, no fracture, subluxation, or dislocation. Joint spaces maintained.  IMPRESSION: No acute bony abnormality.   Electronically Signed By: Rolm Baptise M.D. On: 12/17/2019 08:10   Complexity Note: Imaging results reviewed. Results shared with Ms. Thien, using Layman's terms.                         ROS   Cardiovascular: High blood pressure Pulmonary or Respiratory: Lung problems and Snoring  Neurological: No reported neurological signs or symptoms such as seizures, abnormal skin sensations, urinary and/or fecal incontinence, being born with an abnormal open spine and/or a tethered spinal cord Psychological-Psychiatric: Anxiousness and Depressed Gastrointestinal: Reflux or heatburn Genitourinary: Kidney disease Hematological: No reported hematological signs or symptoms such as prolonged bleeding, low or poor functioning platelets, bruising or bleeding easily, hereditary bleeding problems, low energy levels due to low hemoglobin or being anemic Endocrine: High blood sugar controlled without the use of insulin (NIDDM) Rheumatologic: Generalized muscle aches (Fibromyalgia) Musculoskeletal: Negative for myasthenia gravis, muscular dystrophy, multiple sclerosis or malignant hyperthermia Work History: Retired  Allergies  Ms. Weathers is allergic to elemental sulfur, sulfa antibiotics, and tricor [fenofibrate].  Laboratory Chemistry Profile   Renal Lab Results  Component Value Date   BUN 33 (H) 09/07/2020   CREATININE 1.38 (H) 09/07/2020   LABCREA 302 09/04/2020   GFRAA 44 (L) 09/07/2020   GFRNONAA 38 (L) 09/07/2020   PROTEINUR 30 (A) 09/04/2020     Electrolytes Lab Results  Component Value Date   NA 139 09/07/2020   K 3.9 09/07/2020   CL 103 09/07/2020   CALCIUM 9.1 09/07/2020     Hepatic Lab Results  Component Value Date   AST 61 (H) 09/04/2020   ALT 22 09/04/2020   ALBUMIN 3.7 09/04/2020   ALKPHOS 66 09/04/2020   LIPASE 25 09/04/2020     ID Lab Results  Component Value Date   SARSCOV2NAA  NEGATIVE 09/05/2020     Bone No results found for: VD25OH, H139778, GT3646OE3, OZ2248GN0, 25OHVITD1, 25OHVITD2, 25OHVITD3, TESTOFREE, TESTOSTERONE   Endocrine Lab Results  Component Value Date   GLUCOSE 118 (H) 09/07/2020   GLUCOSEU NEGATIVE 09/04/2020   HGBA1C 5.6 09/05/2020    TSH 0.297 (L) 08/12/2014     Neuropathy Lab Results  Component Value Date   HGBA1C 5.6 09/05/2020     CNS No results found for: COLORCSF, APPEARCSF, RBCCOUNTCSF, WBCCSF, POLYSCSF, LYMPHSCSF, EOSCSF, PROTEINCSF, GLUCCSF, JCVIRUS, CSFOLI, IGGCSF, LABACHR, ACETBL, LABACHR, ACETBL   Inflammation (CRP: Acute  ESR: Chronic) Lab Results  Component Value Date   LATICACIDVEN 1.0 09/05/2020     Rheumatology No results found for: RF, ANA, LABURIC, URICUR, LYMEIGGIGMAB, LYMEABIGMQN, HLAB27   Coagulation Lab Results  Component Value Date   PLT 181 09/05/2020     Cardiovascular Lab Results  Component Value Date   BNP 123 08/11/2014   CKMB 1.6 08/12/2014   TROPONINI <0.03 03/17/2017   HGB 10.7 (L) 09/05/2020   HCT 32.8 (L) 09/05/2020     Screening Lab Results  Component Value Date   SARSCOV2NAA NEGATIVE 09/05/2020     Cancer No results found for: CEA, CA125, LABCA2   Allergens No results found for: ALMOND, APPLE, ASPARAGUS, AVOCADO, BANANA, BARLEY, BASIL, BAYLEAF, GREENBEAN, LIMABEAN, WHITEBEAN, BEEFIGE, REDBEET, BLUEBERRY, BROCCOLI, CABBAGE, MELON, CARROT, CASEIN, CASHEWNUT, CAULIFLOWER, CELERY     Note: Lab results reviewed.  PFSH  Drug: Ms. Bardwell  reports no history of drug use. Alcohol:  reports no history of alcohol use. Tobacco:  reports that she has never smoked. She has never used smokeless tobacco. Medical:  has a past medical history of Anxiety, Depression, Diabetes mellitus without complication ([place]), Elevated lipids, Fibromyalgia, Headache, Hypertension, OA (osteoarthritis), and Sleep apnea. Family: family history includes Breast cancer (age of onset: 91) in her paternal aunt; CAD in her father; COPD in her father; CVA in her mother; Diabetes in her father; Hypertension in her mother.  Past Surgical History:  Procedure Laterality Date   ABDOMINAL HYSTERECTOMY     APPENDECTOMY     CHOLECYSTECTOMY     COLONOSCOPY     COLONOSCOPY WITH PROPOFOL N/A 07/07/2016    Procedure: COLONOSCOPY WITH PROPOFOL;  Surgeon: Manya Silvas, MD;  Location: [facility] ENDOSCOPY;  Service: Endoscopy;  Laterality: N/A;   ESOPHAGOGASTRODUODENOSCOPY     FLEXIBLE SIGMOIDOSCOPY     nanoflex Bilateral    Active Ambulatory Problems    Diagnosis Date Noted   Acute respiratory failure with hypoxia ([place]) 02/01/2017   Sepsis ([place]) 03/17/2017   Community acquired pneumonia 03/17/2017   COPD with acute exacerbation ([place]) 03/17/2017   Hypoxia 03/17/2017   Hyponatremia 03/17/2017   Acute on chronic renal insufficiency 03/17/2017   Pneumonia 03/17/2017   Severe sepsis with acute organ dysfunction ([place]) 09/04/2020   Diabetes mellitus without complication ([place])    Hypertension associated with diabetes ([place])    Acute kidney injury superimposed on CKD ([place])    Acute lower UTI    Anxiety and depression    OSA (obstructive sleep apnea)    Type 2 diabetes mellitus with hyperlipidemia ([facility])    Acute metabolic encephalopathy    Moderate persistent asthma    Acute bronchitis    Weakness    Generalized abdominal pain    Sinus tachycardia    Lumbar spondylosis 11/17/2021   Lumbar degenerative disc disease 11/17/2021   Chronic pain syndrome 11/17/2021   Resolved Ambulatory Problems    Diagnosis Date Noted  No Resolved Ambulatory Problems   Past Medical History:  Diagnosis Date   Anxiety    Depression    Elevated lipids    Fibromyalgia    Headache    Hypertension    OA (osteoarthritis)    Sleep apnea    Constitutional Exam  General appearance: Well nourished, well developed, and well hydrated. In no apparent acute distress Vitals:   11/17/21 1257  BP: 120/67  Pulse: (!) 115  Resp: 16  Temp: (!) 97.1 F (36.2 C)  TempSrc: Temporal  SpO2: 97%  Weight: 182 lb (82.6 kg)  Height: 4' 11" (1.499 m)   BMI Assessment: Estimated body mass index is 36.76 kg/m as calculated from the following:   Height as of this encounter: 4' 11" (1.499 m).   Weight as of this  encounter: 182 lb (82.6 kg).  BMI interpretation table: BMI level Category Range association with higher incidence of chronic pain  <18 kg/m2 Underweight   18.5-24.9 kg/m2 Ideal body weight   25-29.9 kg/m2 Overweight Increased incidence by 20%  30-34.9 kg/m2 Obese (Class I) Increased incidence by 68%  35-39.9 kg/m2 Severe obesity (Class II) Increased incidence by 136%  >40 kg/m2 Extreme obesity (Class III) Increased incidence by 254%   Patient's current BMI Ideal Body weight  Body mass index is 36.76 kg/m. Patient must be at least 60 in tall to calculate ideal body weight   BMI Readings from Last 4 Encounters:  11/17/21 36.76 kg/m  05/06/21 36.76 kg/m  09/04/20 40.40 kg/m  12/17/19 40.40 kg/m   Wt Readings from Last 4 Encounters:  11/17/21 182 lb (82.6 kg)  05/06/21 182 lb (82.6 kg)  09/04/20 200 lb (90.7 kg)  12/17/19 200 lb (90.7 kg)    Psych/Mental status: Alert, oriented x 3 (person, place, & time)       Eyes: PERLA Respiratory: No evidence of acute respiratory distress  Thoracic Spine Area Exam  Skin & Axial Inspection: No masses, redness, or swelling Alignment: Symmetrical Functional ROM: Unrestricted ROM Stability: No instability detected Muscle Tone/Strength: Functionally intact. No obvious neuro-muscular anomalies detected. Sensory (Neurological): Unimpaired Muscle strength & Tone: No palpable anomalies Lumbar Spine Area Exam  Skin & Axial Inspection: No masses, redness, or swelling Alignment: Symmetrical Functional ROM: Unrestricted ROM       Stability: No instability detected Muscle Tone/Strength: Functionally intact. No obvious neuro-muscular anomalies detected. Sensory (Neurological): Musculoskeletal pain pattern Palpation: No palpable anomalies       Provocative Tests: Hyperextension/rotation test: (+) bilaterally for facet joint pain. Lumbar quadrant test (Kemp's test): (+) bilaterally for facet joint pain.   Gait & Posture Assessment   Ambulation: Unassisted Gait: Relatively normal for age and body habitus Posture: WNL  Lower Extremity Exam    Side: Right lower extremity  Side: Left lower extremity  Stability: No instability observed          Stability: No instability observed          Skin & Extremity Inspection: Skin color, temperature, and hair growth are WNL. No peripheral edema or cyanosis. No masses, redness, swelling, asymmetry, or associated skin lesions. No contractures.  Skin & Extremity Inspection: Skin color, temperature, and hair growth are WNL. No peripheral edema or cyanosis. No masses, redness, swelling, asymmetry, or associated skin lesions. No contractures.  Functional ROM: Unrestricted ROM                  Functional ROM: Unrestricted ROM  Muscle Tone/Strength: Functionally intact. No obvious neuro-muscular anomalies detected.  Muscle Tone/Strength: Functionally intact. No obvious neuro-muscular anomalies detected.  Sensory (Neurological): Unimpaired        Sensory (Neurological): Unimpaired        DTR: Patellar: deferred today Achilles: deferred today Plantar: deferred today  DTR: Patellar: deferred today Achilles: deferred today Plantar: deferred today  Palpation: No palpable anomalies  Palpation: No palpable anomalies    Assessment  Primary Diagnosis & Pertinent Problem List: The primary encounter diagnosis was Lumbar facet arthropathy. Diagnoses of Lumbar spondylosis, Lumbar degenerative disc disease, and Chronic pain syndrome were also pertinent to this visit.  Visit Diagnosis (New problems to examiner): 1. Lumbar facet arthropathy   2. Lumbar spondylosis   3. Lumbar degenerative disc disease   4. Chronic pain syndrome    Plan of Care (Initial workup plan)  General Recommendations: The pain condition that the patient suffers from is best treated with a multidisciplinary approach that involves an increase in physical activity to prevent de-conditioning and worsening of the  pain cycle, as well as psychological counseling (formal and/or informal) to address the co-morbid psychological affects of pain. Treatment will often involve judicious use of pain medications and interventional procedures to decrease the pain, allowing the patient to participate in the physical activity that will ultimately produce long-lasting pain reductions. The goal of the multidisciplinary approach is to return the patient to a higher level of overall function and to restore their ability to perform activities of daily living.    Patient's low back pain is related to severe bilateral facet arthropathy and lumbar spondylosis most pronounced at L4-5 and L5-S1.  She has severe central canal stenosis at L4-L5 with grade 1 anterolisthesis as well.  She also has severe bilateral facet arthropathy at L3-L4.  Moderate facet arthropathy at L2-L3.  She has tried physical therapy in the past.  She has tried epidurals with limited response.  Given that majority of her pain is low back.  I discussed diagnostic lumbar facet medial branch nerve blocks bilaterally at L3, L4, L5.  Based upon her response to these can further discuss radiofrequency ablation versus Sprint peripheral nerve stimulation of medial branch nerve.  I do not recommend dorsal column spinal cord stimulation for this patient as she does not have a past surgical history of all lumbar spine, does not have CRPS nor does she have lower extremity neuropathic pain.  For this reason, peripheral nerve stimulation would be a better option.  I will also obtain a baseline urine toxicology screen in case we need to consider chronic opioid therapy for this patient.  All questions answered and patient in agreement with plan.  Yael J Eichelberger has a history of greater than 3 months of moderate to severe pain which is resulted in functional impairment.  The patient has tried various conservative therapeutic options such as NSAIDs, Tylenol, muscle relaxants, physical  therapy which was inadequately effective.  Patient's pain is predominantly axial with physical exam findings suggestive of facet arthropathy.Lumbar facet medial branch nerve blocks were discussed with the patient.  Risks and benefits were reviewed.  Patient would like to proceed with bilateral L3, L4, L5 medial branch nerve block.  Orders Placed This Encounter  Procedures   LUMBAR FACET(MEDIAL BRANCH NERVE BLOCK) MBNB    Standing Status:   Future    Standing Expiration Date:   12/17/2021    Scheduling Instructions:     Procedure: Lumbar facet block (AKA.: Lumbosacral medial branch nerve block)       Side: Bilateral     Level: L3-4, L4-5, Facets (L3, L4, L5, Medial Branch Nerves)     Sedation: without     Timeframe: ASAA    Order Specific Question:   Where will this procedure be performed?    Answer:   [facility]   DG PAIN CLINIC C-ARM 1-60 MIN NO REPORT    Intraoperative interpretation by procedural physician at [facility].    Standing Status:   Standing    Number of Occurrences:   1    Order Specific Question:   Reason for exam:    Answer:   Assistance in needle guidance and placement for procedures requiring needle placement in or near specific anatomical locations not easily accessible without such assistance.   Compliance Drug Analysis, Ur    Volume: 30 ml(s). Minimum 3 ml of urine is needed. Document temperature of fresh sample. Indications: Long term (current) use of opiate analgesic (Z79.891) Test#: [id number] (Comprehensive Profile)    Order Specific Question:   Release to patient    Answer:   Immediate      Lab Orders         Compliance Drug Analysis, Ur     Imaging Orders         DG PAIN CLINIC C-ARM 1-60 MIN NO REPORT      Procedure Orders         LUMBAR FACET(MEDIAL BRANCH NERVE BLOCK) MBNB     Provider-requested follow-up: Return in about 2 weeks (around 12/01/2021) for B/L L3, 4, 5 Fct block , without sedation.  I spent a total of 60 minutes  reviewing chart data, face-to-face evaluation with the patient, counseling and coordination of care as detailed above.   Future Appointments  Date Time Provider Department Center  11/29/2021  1:00 PM , , MD [facility] None    Note by:  , MD Date: 11/17/2021; Time: 1:53 PM 

## 2021-11-17 NOTE — Patient Instructions (Signed)
GENERAL RISKS AND COMPLICATIONS ° °What are the risk, side effects and possible complications? °Generally speaking, most procedures are safe.  However, with any procedure there are risks, side effects, and the possibility of complications.  The risks and complications are dependent upon the sites that are lesioned, or the type of nerve block to be performed.  The closer the procedure is to the spine, the more serious the risks are.  Great care is taken when placing the radio frequency needles, block needles or lesioning probes, but sometimes complications can occur. °Infection: Any time there is an injection through the skin, there is a risk of infection.  This is why sterile conditions are used for these blocks.  There are four possible types of infection. °Localized skin infection. °Central Nervous System Infection-This can be in the form of Meningitis, which can be deadly. °Epidural Infections-This can be in the form of an epidural abscess, which can cause pressure inside of the spine, causing compression of the spinal cord with subsequent paralysis. This would require an emergency surgery to decompress, and there are no guarantees that the patient would recover from the paralysis. °Discitis-This is an infection of the intervertebral discs.  It occurs in about 1% of discography procedures.  It is difficult to treat and it may lead to surgery. ° °      2. Pain: the needles have to go through skin and soft tissues, will cause soreness. °      3. Damage to internal structures:  The nerves to be lesioned may be near blood vessels or   ° other nerves which can be potentially damaged. °      4. Bleeding: Bleeding is more common if the patient is taking blood thinners such as  aspirin, Coumadin, Ticiid, Plavix, etc., or if he/she have some genetic predisposition  such as hemophilia. Bleeding into the spinal canal can cause compression of the spinal  cord with subsequent paralysis.  This would require an emergency  surgery to  decompress and there are no guarantees that the patient would recover from the  paralysis. °      5. Pneumothorax:  Puncturing of a lung is a possibility, every time a needle is introduced in  the area of the chest or upper back.  Pneumothorax refers to free air around the  collapsed lung(s), inside of the thoracic cavity (chest cavity).  Another two possible  complications related to a similar event would include: Hemothorax and Chylothorax.   These are variations of the Pneumothorax, where instead of air around the collapsed  lung(s), you may have blood or chyle, respectively. °      6. Spinal headaches: They may occur with any procedures in the area of the spine. °      7. Persistent CSF (Cerebro-Spinal Fluid) leakage: This is a rare problem, but may occur  with prolonged intrathecal or epidural catheters either due to the formation of a fistulous  track or a dural tear. °      8. Nerve damage: By working so close to the spinal cord, there is always a possibility of  nerve damage, which could be as serious as a permanent spinal cord injury with  paralysis. °      9. Death:  Although rare, severe deadly allergic reactions known as "Anaphylactic  reaction" can occur to any of the medications used. °     10. Worsening of the symptoms:  We can always make thing worse. ° °What are the chances   of something like this happening? °Chances of any of this occuring are extremely low.  By statistics, you have more of a chance of getting killed in a motor vehicle accident: while driving to the hospital than any of the above occurring .  Nevertheless, you should be aware that they are possibilities.  In general, it is similar to taking a shower.  Everybody knows that you can slip, hit your head and get killed.  Does that mean that you should not shower again?  Nevertheless always keep in mind that statistics do not mean anything if you happen to be on the wrong side of them.  Even if a procedure has a 1 (one) in a  1,000,000 (million) chance of going wrong, it you happen to be that one..Also, keep in mind that by statistics, you have more of a chance of having something go wrong when taking medications. ° °Who should not have this procedure? °If you are on a blood thinning medication (e.g. Coumadin, Plavix, see list of "Blood Thinners"), or if you have an active infection going on, you should not have the procedure.  If you are taking any blood thinners, please inform your physician. ° °How should I prepare for this procedure? °Do not eat or drink anything at least six hours prior to the procedure. °Bring a driver with you .  It cannot be a taxi. °Come accompanied by an adult that can drive you back, and that is strong enough to help you if your legs get weak or numb from the local anesthetic. °Take all of your medicines the morning of the procedure with just enough water to swallow them. °If you have diabetes, make sure that you are scheduled to have your procedure done first thing in the morning, whenever possible. °If you have diabetes, take only half of your insulin dose and notify our nurse that you have done so as soon as you arrive at the clinic. °If you are diabetic, but only take blood sugar pills (oral hypoglycemic), then do not take them on the morning of your procedure.  You may take them after you have had the procedure. °Do not take aspirin or any aspirin-containing medications, at least eleven (11) days prior to the procedure.  They may prolong bleeding. °Wear loose fitting clothing that may be easy to take off and that you would not mind if it got stained with Betadine or blood. °Do not wear any jewelry or perfume °Remove any nail coloring.  It will interfere with some of our monitoring equipment. ° °NOTE: Remember that this is not meant to be interpreted as a complete list of all possible complications.  Unforeseen problems may occur. ° °BLOOD THINNERS °The following drugs contain aspirin or other products,  which can cause increased bleeding during surgery and should not be taken for 2 weeks prior to and 1 week after surgery.  If you should need take something for relief of minor pain, you may take acetaminophen which is found in Tylenol,m Datril, Anacin-3 and Panadol. It is not blood thinner. The products listed below are.  Do not take any of the products listed below in addition to any listed on your instruction sheet. ° °A.P.C or A.P.C with Codeine Codeine Phosphate Capsules #3 Ibuprofen Ridaura  °ABC compound Congesprin Imuran rimadil  °Advil Cope Indocin Robaxisal  °Alka-Seltzer Effervescent Pain Reliever and Antacid Coricidin or Coricidin-D ° Indomethacin Rufen  °Alka-Seltzer plus Cold Medicine Cosprin Ketoprofen S-A-C Tablets  °Anacin Analgesic Tablets or Capsules Coumadin   Korlgesic Salflex  Anacin Extra Strength Analgesic tablets or capsules CP-2 Tablets Lanoril Salicylate  Anaprox Cuprimine Capsules Levenox Salocol  Anexsia-D Dalteparin Magan Salsalate  Anodynos Darvon compound Magnesium Salicylate Sine-off  Ansaid Dasin Capsules Magsal Sodium Salicylate  Anturane Depen Capsules Marnal Soma  APF Arthritis pain formula Dewitt's Pills Measurin Stanback  Argesic Dia-Gesic Meclofenamic Sulfinpyrazone  Arthritis Bayer Timed Release Aspirin Diclofenac Meclomen Sulindac  Arthritis pain formula Anacin Dicumarol Medipren Supac  Analgesic (Safety coated) Arthralgen Diffunasal Mefanamic Suprofen  Arthritis Strength Bufferin Dihydrocodeine Mepro Compound Suprol  Arthropan liquid Dopirydamole Methcarbomol with Aspirin Synalgos  ASA tablets/Enseals Disalcid Micrainin Tagament  Ascriptin Doan's Midol Talwin  Ascriptin A/D Dolene Mobidin Tanderil  Ascriptin Extra Strength Dolobid Moblgesic Ticlid  Ascriptin with Codeine Doloprin or Doloprin with Codeine Momentum Tolectin  Asperbuf Duoprin Mono-gesic Trendar  Aspergum Duradyne Motrin or Motrin IB Triminicin  Aspirin plain, buffered or enteric coated  Durasal Myochrisine Trigesic  Aspirin Suppositories Easprin Nalfon Trillsate  Aspirin with Codeine Ecotrin Regular or Extra Strength Naprosyn Uracel  Atromid-S Efficin Naproxen Ursinus  Auranofin Capsules Elmiron Neocylate Vanquish  Axotal Emagrin Norgesic Verin  Azathioprine Empirin or Empirin with Codeine Normiflo Vitamin E  Azolid Emprazil Nuprin Voltaren  Bayer Aspirin plain, buffered or children's or timed BC Tablets or powders Encaprin Orgaran Warfarin Sodium  Buff-a-Comp Enoxaparin Orudis Zorpin  Buff-a-Comp with Codeine Equegesic Os-Cal-Gesic   Buffaprin Excedrin plain, buffered or Extra Strength Oxalid   Bufferin Arthritis Strength Feldene Oxphenbutazone   Bufferin plain or Extra Strength Feldene Capsules Oxycodone with Aspirin   Bufferin with Codeine Fenoprofen Fenoprofen Pabalate or Pabalate-SF   Buffets II Flogesic Panagesic   Buffinol plain or Extra Strength Florinal or Florinal with Codeine Panwarfarin   Buf-Tabs Flurbiprofen Penicillamine   Butalbital Compound Four-way cold tablets Penicillin   Butazolidin Fragmin Pepto-Bismol   Carbenicillin Geminisyn Percodan   Carna Arthritis Reliever Geopen Persantine   Carprofen Gold's salt Persistin   Chloramphenicol Goody's Phenylbutazone   Chloromycetin Haltrain Piroxlcam   Clmetidine heparin Plaquenil   Cllnoril Hyco-pap Ponstel   Clofibrate Hydroxy chloroquine Propoxyphen         Before stopping any of these medications, be sure to consult the physician who ordered them.  Some, such as Coumadin (Warfarin) are ordered to prevent or treat serious conditions such as "deep thrombosis", "pumonary embolisms", and other heart problems.  The amount of time that you may need off of the medication may also vary with the medication and the reason for which you were taking it.  If you are taking any of these medications, please make sure you notify your pain physician before you undergo any procedures.         Facet Blocks Patient  Information  Description: The facets are joints in the spine between the vertebrae.  Like any joints in the body, facets can become irritated and painful.  Arthritis can also effect the facets.  By injecting steroids and local anesthetic in and around these joints, we can temporarily block the nerve supply to them.  Steroids act directly on irritated nerves and tissues to reduce selling and inflammation which often leads to decreased pain.  Facet blocks may be done anywhere along the spine from the neck to the low back depending upon the location of your pain.   After numbing the skin with local anesthetic (like Novocaine), a small needle is passed onto the facet joints under x-ray guidance.  You may experience a sensation of pressure while this is being done.  The   entire block usually lasts about 15-25 minutes.   Conditions which may be treated by facet blocks:  Low back/buttock pain Neck/shoulder pain Certain types of headaches  Preparation for the injection:  Do not eat any solid food or dairy products within 8 hours of your appointment. You may drink clear liquid up to 3 hours before appointment.  Clear liquids include water, black coffee, juice or soda.  No milk or cream please. You may take your regular medication, including pain medications, with a sip of water before your appointment.  Diabetics should hold regular insulin (if taken separately) and take 1/2 normal NPH dose the morning of the procedure.  Carry some sugar containing items with you to your appointment. A driver must accompany you and be prepared to drive you home after your procedure. Bring all your current medications with you. An IV may be inserted and sedation may be given at the discretion of the physician. A blood pressure cuff, EKG and other monitors will often be applied during the procedure.  Some patients may need to have extra oxygen administered for a short period. You will be asked to provide medical information,  including your allergies and medications, prior to the procedure.  We must know immediately if you are taking blood thinners (like Coumadin/Warfarin) or if you are allergic to IV iodine contrast (dye).  We must know if you could possible be pregnant.  Possible side-effects:  Bleeding from needle site Infection (rare, may require surgery) Nerve injury (rare) Numbness & tingling (temporary) Difficulty urinating (rare, temporary) Spinal headache (a headache worse with upright posture) Light-headedness (temporary) Pain at injection site (serveral days) Decreased blood pressure (rare, temporary) Weakness in arm/leg (temporary) Pressure sensation in back/neck (temporary)   Call if you experience:  Fever/chills associated with headache or increased back/neck pain Headache worsened by an upright position New onset, weakness or numbness of an extremity below the injection site Hives or difficulty breathing (go to the emergency room) Inflammation or drainage at the injection site(s) Severe back/neck pain greater than usual New symptoms which are concerning to you  Please note:  Although the local anesthetic injected can often make your back or neck feel good for several hours after the injection, the pain will likely return. It takes 3-7 days for steroids to work.  You may not notice any pain relief for at least one week.  If effective, we will often do a series of 2-3 injections spaced 3-6 weeks apart to maximally decrease your pain.  After the initial series, you may be a candidate for a more permanent nerve block of the facets.  If you have any questions, please call [phone] [facility] 

## 2021-11-18 ENCOUNTER — Encounter: Payer: Self-pay | Admitting: Student in an Organized Health Care Education/Training Program

## 2021-11-22 DIAGNOSIS — E1169 Type 2 diabetes mellitus with other specified complication: Secondary | ICD-10-CM | POA: Diagnosis not present

## 2021-11-22 DIAGNOSIS — E782 Mixed hyperlipidemia: Secondary | ICD-10-CM | POA: Diagnosis not present

## 2021-11-24 DIAGNOSIS — E782 Mixed hyperlipidemia: Secondary | ICD-10-CM | POA: Diagnosis not present

## 2021-11-24 DIAGNOSIS — Z23 Encounter for immunization: Secondary | ICD-10-CM | POA: Diagnosis not present

## 2021-11-24 DIAGNOSIS — M5416 Radiculopathy, lumbar region: Secondary | ICD-10-CM | POA: Diagnosis not present

## 2021-11-24 DIAGNOSIS — E1169 Type 2 diabetes mellitus with other specified complication: Secondary | ICD-10-CM | POA: Diagnosis not present

## 2021-11-24 DIAGNOSIS — M5136 Other intervertebral disc degeneration, lumbar region: Secondary | ICD-10-CM | POA: Diagnosis not present

## 2021-11-25 LAB — COMPLIANCE DRUG ANALYSIS, UR

## 2021-11-29 ENCOUNTER — Ambulatory Visit: Payer: PPO | Admitting: Student in an Organized Health Care Education/Training Program

## 2021-11-29 ENCOUNTER — Ambulatory Visit
Admission: RE | Admit: 2021-11-29 | Discharge: 2021-11-29 | Disposition: A | Discharge status: Home / Self Care | Payer: PPO | Source: Ambulatory Visit | Attending: Student in an Organized Health Care Education/Training Program | Admitting: Student in an Organized Health Care Education/Training Program

## 2021-11-29 ENCOUNTER — Other Ambulatory Visit: Payer: Self-pay

## 2021-11-29 ENCOUNTER — Ambulatory Visit (HOSPITAL_BASED_OUTPATIENT_CLINIC_OR_DEPARTMENT_OTHER): Payer: PPO | Admitting: Student in an Organized Health Care Education/Training Program

## 2021-11-29 ENCOUNTER — Encounter: Payer: Self-pay | Admitting: Student in an Organized Health Care Education/Training Program

## 2021-11-29 VITALS — BP 143/82 | HR 112 | Temp 97.7°F | Resp 16 | Ht 59.0 in | Wt 182.0 lb

## 2021-11-29 DIAGNOSIS — G894 Chronic pain syndrome: Secondary | ICD-10-CM

## 2021-11-29 DIAGNOSIS — M47816 Spondylosis without myelopathy or radiculopathy, lumbar region: Secondary | ICD-10-CM | POA: Insufficient documentation

## 2021-11-29 MED ORDER — LIDOCAINE HCL 2 % IJ SOLN
INTRAMUSCULAR | Status: AC
  Filled 2021-11-29: qty 20

## 2021-11-29 MED ORDER — ROPIVACAINE HCL 2 MG/ML IJ SOLN
9.0000 mL | Freq: Once | INTRAMUSCULAR | Status: AC
  Administered 2021-11-29: 9 mL via PERINEURAL

## 2021-11-29 MED ORDER — LIDOCAINE HCL 2 % IJ SOLN
20.0000 mL | Freq: Once | INTRAMUSCULAR | Status: AC
  Administered 2021-11-29: 400 mg

## 2021-11-29 MED ORDER — DEXAMETHASONE SODIUM PHOSPHATE 10 MG/ML IJ SOLN
10.0000 mg | Freq: Once | INTRAMUSCULAR | Status: AC
  Administered 2021-11-29: 10 mg

## 2021-11-29 MED ORDER — DEXAMETHASONE SODIUM PHOSPHATE 10 MG/ML IJ SOLN
INTRAMUSCULAR | Status: AC
  Filled 2021-11-29: qty 2

## 2021-11-29 MED ORDER — ROPIVACAINE HCL 2 MG/ML IJ SOLN
INTRAMUSCULAR | Status: AC
  Filled 2021-11-29: qty 20

## 2021-11-29 NOTE — Progress Notes (Signed)
PROVIDER NOTE: Information contained herein reflects review and annotations entered in association with encounter. Interpretation of such information and data should be left to medically-trained personnel. Information provided to patient can be located elsewhere in the medical record under "Patient Instructions". Document created using STT-dictation technology, any transcriptional errors that may result from process are unintentional.    Patient: Sara Mcdonald  Service Category: Procedure Provider: Gillis Santa, MD DOB: February 22, 1946 DOS: 11/29/2021 Location: [facility] MRN: [id number] Setting: Ambulatory - outpatient Referring Provider: [name], MD Type: Established Patient Specialty: Interventional Pain Management PCP: [name], MD  Primary Reason for Visit: Interventional Pain Management Treatment. CC: Back Pain (Lumbar bilateral )     Procedure:          Type: Lumbar Facet, Medial Branch Block(s) #1  Primary Purpose: Diagnostic/Therapeutic Region: Posterolateral Lumbosacral Spine Level: L3, L4, L5, Medial Branch Level(s). Injecting these levels blocks the L3-4, L4-5,  lumbar facet joints. Laterality: Bilateral Anesthesia: Local (1-2% Lidocaine)  Anxiolysis: None  Sedation: None  Guidance: Fluoroscopy          Indications: 1. Lumbar facet arthropathy   2. Lumbar spondylosis   3. Chronic pain syndrome    Pain Score: Pre-procedure: 7 /10 Post-procedure: 0-No pain (standing and moving)/10     Position: Prone  Pre-op H&P Assessment:  Sara Mcdonald is a 75 y.o. (year old), female patient, seen today for interventional treatment. She  has a past surgical history that includes Appendectomy; Cholecystectomy; Colonoscopy; Esophagogastroduodenoscopy; Abdominal hysterectomy; nanoflex (Bilateral); Flexible sigmoidoscopy; and Colonoscopy with propofol (N/A, 07/07/2016). Sara Mcdonald has a current medication list which includes the following prescription(s):  acetaminophen-codeine, alprazolam, cholecalciferol, metformin, multivitamin with minerals, nystatin-triamcinolone, olmesartan, pantoprazole, polyethylene glycol powder, pravastatin, promethazine, promethazine, topiramate, trazodone, triamterene-hydrochlorothiazide, and venlafaxine xr. Her primarily concern today is the Back Pain (Lumbar bilateral )  Initial Vital Signs:  Pulse/HCG Rate:  (!) 106  Temp: 97.7 F (36.5 C) Resp: 16 BP:  (!) 154/67 SpO2: 99 %  BMI: Estimated body mass index is 36.76 kg/m as calculated from the following:   Height as of this encounter: 4\' 11"  (1.499 m).   Weight as of this encounter: 182 lb (82.6 kg).  Risk Assessment: Allergies: Reviewed. She is allergic to elemental sulfur, sulfa antibiotics, and tricor [fenofibrate].  Allergy Precautions: None required Coagulopathies: Reviewed. None identified.  Blood-thinner therapy: None at this time Active Infection(s): Reviewed. None identified. Sara Mcdonald is afebrile  Site Confirmation: Sara Mcdonald was asked to confirm the procedure and laterality before marking the site Procedure checklist: Completed Consent: Before the procedure and under the influence of no sedative(s), amnesic(s), or anxiolytics, the patient was informed of the treatment options, risks and possible complications. To fulfill our ethical and legal obligations, as recommended by the American Medical Association's Code of Ethics, I have informed the patient of my clinical impression; the nature and purpose of the treatment or procedure; the risks, benefits, and possible complications of the intervention; the alternatives, including doing nothing; the risk(s) and benefit(s) of the alternative treatment(s) or procedure(s); and the risk(s) and benefit(s) of doing nothing. The patient was provided information about the general risks and possible complications associated with the procedure. These may include, but are not limited to: failure to achieve  desired goals, infection, bleeding, organ or nerve damage, allergic reactions, paralysis, and death. In addition, the patient was informed of those risks and complications associated to Spine-related procedures, such as failure to decrease pain; infection (i.e.: Meningitis, epidural or intraspinal  abscess); bleeding (i.e.: epidural hematoma, subarachnoid hemorrhage, or any other type of intraspinal or peri-dural bleeding); organ or nerve damage (i.e.: Any type of peripheral nerve, nerve root, or spinal cord injury) with subsequent damage to sensory, motor, and/or autonomic systems, resulting in permanent pain, numbness, and/or weakness of one or several areas of the body; allergic reactions; (i.e.: anaphylactic reaction); and/or death. Furthermore, the patient was informed of those risks and complications associated with the medications. These include, but are not limited to: allergic reactions (i.e.: anaphylactic or anaphylactoid reaction(s)); adrenal axis suppression; blood sugar elevation that in diabetics may result in ketoacidosis or comma; water retention that in patients with history of congestive heart failure may result in shortness of breath, pulmonary edema, and decompensation with resultant heart failure; weight gain; swelling or edema; medication-induced neural toxicity; particulate matter embolism and blood vessel occlusion with resultant organ, and/or nervous system infarction; and/or aseptic necrosis of one or more joints. Finally, the patient was informed that Medicine is not an exact science; therefore, there is also the possibility of unforeseen or unpredictable risks and/or possible complications that may result in a catastrophic outcome. The patient indicated having understood very clearly. We have given the patient no guarantees and we have made no promises. Enough time was given to the patient to ask questions, all of which were answered to the patient's satisfaction. Sara Mcdonald has  indicated that she wanted to continue with the procedure. Attestation: I, the ordering provider, attest that I have discussed with the patient the benefits, risks, side-effects, alternatives, likelihood of achieving goals, and potential problems during recovery for the procedure that I have provided informed consent. Date  Time: 11/29/2021 12:36 PM  Pre-Procedure Preparation:  Monitoring: As per clinic protocol. Respiration, ETCO2, SpO2, BP, heart rate and rhythm monitor placed and checked for adequate function Safety Precautions: Patient was assessed for positional comfort and pressure points before starting the procedure. Time-out: I initiated and conducted the "Time-out" before starting the procedure, as per protocol. The patient was asked to participate by confirming the accuracy of the "Time Out" information. Verification of the correct person, site, and procedure were performed and confirmed by me, the nursing staff, and the patient. "Time-out" conducted as per Joint Commission's Universal Protocol (UP.01.01.01). Time: 1301  Description of Procedure:          Laterality: Bilateral. The procedure was performed in identical fashion on both sides. Levels: L3, L4, L5, Medial Branch Level(s) Area Prepped: Posterior Lumbosacral Region DuraPrep (Iodine Povacrylex [0.7% available iodine] and Isopropyl Alcohol, 74% w/w) Safety Precautions: Aspiration looking for blood return was conducted prior to all injections. At no point did we inject any substances, as a needle was being advanced. Before injecting, the patient was told to immediately notify me if she was experiencing any new onset of "ringing in the ears, or metallic taste in the mouth". No attempts were made at seeking any paresthesias. Safe injection practices and needle disposal techniques used. Medications properly checked for expiration dates. SDV (single dose vial) medications used. After the completion of the procedure, all disposable  equipment used was discarded in the proper designated medical waste containers. Local Anesthesia: Protocol guidelines were followed. The patient was positioned over the fluoroscopy table. The area was prepped in the usual manner. The time-out was completed. The target area was identified using fluoroscopy. A 12-in long, straight, sterile hemostat was used with fluoroscopic guidance to locate the targets for each level blocked. Once located, the skin was marked with an approved surgical  skin marker. Once all sites were marked, the skin (epidermis, dermis, and hypodermis), as well as deeper tissues (fat, connective tissue and muscle) were infiltrated with a small amount of a short-acting local anesthetic, loaded on a 10cc syringe with a 25G, 1.5-in  Needle. An appropriate amount of time was allowed for local anesthetics to take effect before proceeding to the next step. Local Anesthetic: Lidocaine 2.0% The unused portion of the local anesthetic was discarded in the proper designated containers. Technical explanation of process:  needle was inserted until contact was made with os over the superior postero-lateral aspect of the pedicular shadow (target area). After negative aspiration for blood, 58mL of the nerve block solution was injected without difficulty or complication. The needle was removed intact. L4 Medial Branch Nerve Block (MBB): The target area for the L4 medial branch is at the junction of the postero-lateral aspect of the superior articular process and the superior, posterior, and medial edge of the transverse process of L5. Under fluoroscopic guidance, a Quincke needle was inserted until contact was made with os over the superior postero-lateral aspect of the pedicular shadow (target area). After negative aspiration for blood, 73mL of the nerve block solution was injected without difficulty or complication. The needle was removed intact. L5 Medial Branch Nerve Block (MBB): The target area for the L5  medial branch is at the junction of the postero-lateral aspect of the superior articular process and the superior, posterior, and medial edge of the sacral ala. Under fluoroscopic guidance, a Quincke needle was inserted until contact was made with os over the superior postero-lateral aspect of the pedicular shadow (target area). After negative aspiration for blood, 62mL of the nerve block solution was injected without difficulty or complication. The needle was removed intact.   Nerve block solution: 12 cc solution made of 10 cc of 0.2% ropivacaine, 2 cc of Decadron 10 mg/cc.  2 cc injected in each level above bilaterally.   Procedural Needles: 25-gauge, 3.5-inch, Quincke needles used for all levels.  Once the entire procedure was completed, the treated area was cleaned, making sure to leave some of the prepping solution back to take advantage of its long term bactericidal properties.      Illustration of the posterior view of the lumbar spine and the posterior neural structures. Laminae of L2 through S1 are labeled. DPRL5, dorsal primary ramus of L5; DPRS1, dorsal primary ramus of S1; DPR3, dorsal primary ramus of L3; FJ, facet (zygapophyseal) joint L3-L4; I, inferior articular process of L4; LB1, lateral branch of dorsal primary ramus of L1; IAB, inferior articular branches from L3 medial branch (supplies L4-L5 facet joint); IBP, intermediate branch plexus; MB3, medial branch of dorsal primary ramus of L3; NR3, third lumbar nerve root; S, superior articular process of L5; SAB, superior articular branches from L4 (supplies L4-5 facet joint also); TP3, transverse process of L3.  Vitals:   11/29/21 1300 11/29/21 1305 11/29/21 1310 11/29/21 1314  BP: 137/84 (!) 149/88 (!) 149/81 (!) 143/82  Pulse: (!) 111 (!) 112 (!) 111 (!) 112  Resp: (!) 21 20 15 16   Temp:      TempSrc:      SpO2: 96% 97% 96% 97%  Weight:      Height:         Start Time: 1301 hrs. End Time: 1310 hrs.  Imaging Guidance  (Spinal):          Type of Imaging Technique: Fluoroscopy Guidance (Spinal) Indication(s): Assistance in needle guidance and placement for  procedures requiring needle placement in or near specific anatomical locations not easily accessible without such assistance. Exposure Time: Please see nurses notes. Contrast: None used. Fluoroscopic Guidance: I was personally present during the use of fluoroscopy. "Tunnel Vision Technique" used to obtain the best possible view of the target area. Parallax error corrected before commencing the procedure. "Direction-depth-direction" technique used to introduce the needle under continuous pulsed fluoroscopy. Once target was reached, antero-posterior, oblique, and lateral fluoroscopic projection used confirm needle placement in all planes. Images permanently stored in EMR. Interpretation: No contrast injected. I personally interpreted the imaging intraoperatively. Adequate needle placement confirmed in multiple planes. Permanent images saved into the patient's record. Post-operative Assessment:  Post-procedure Vital Signs:  Pulse/HCG Rate:  (!) 112  Temp: 97.7 F (36.5 C) Resp: 16 BP:  (!) 143/82 SpO2: 97 %  EBL: None  Complications: No immediate post-treatment complications observed by team, or reported by patient.  Note: The patient tolerated the entire procedure well. A repeat set of vitals were taken after the procedure and the patient was kept under observation following institutional policy, for this type of procedure. Post-procedural neurological assessment was performed, showing return to baseline, prior to discharge. The patient was provided with post-procedure discharge instructions, including a section on how to identify potential problems. Should any problems arise concerning this procedure, the patient was given instructions to immediately contact us, at any time, without hesitation. In any case, we plan to contact the patient by telephone for a  follow-up status report regarding this interventional procedure.  Comments:  No additional relevant information.  5 out of 5 strength bilateral lower extremity: Plantar flexion, dorsiflexion, knee flexion, knee extension.   Plan of Care  Orders:  Orders Placed This Encounter  Procedures   [facility] C-ARM 1-60 MIN NO REPORT    Intraoperative interpretation by procedural physician at [facility].    Standing Status:   Standing    Number of Occurrences:   1    Order Specific Question:   Reason for exam:    Answer:   Assistance in needle guidance and placement for procedures requiring needle placement in or near specific anatomical locations not easily accessible without such assistance.     Medications ordered for procedure: Meds ordered this encounter  Medications   lidocaine (XYLOCAINE) 2 % (with pres) injection 400 mg   dexamethasone (DECADRON) injection 10 mg   dexamethasone (DECADRON) injection 10 mg   ropivacaine (PF) 2 mg/mL (0.2%) (NAROPIN) injection 9 mL   ropivacaine (PF) 2 mg/mL (0.2%) (NAROPIN) injection 9 mL    Medications administered: We administered lidocaine, dexamethasone, dexamethasone, ropivacaine (PF) 2 mg/mL (0.2%), and ropivacaine (PF) 2 mg/mL (0.2%).  See the medical record for exact dosing, route, and time of administration.  Follow-up plan:   Return in about 4 weeks (around 12/27/2021).     B/L L3,4,5 MBNB #1 11/29/21  Recent Visits Date Type Provider Dept  11/17/21 Office Visit Gillis Santa, MD [facility]  Showing recent visits within past 90 days and meeting all other requirements Today's Visits Date Type Provider Dept  11/29/21 Procedure visit Gillis Santa, MD [facility]  Showing today's visits and meeting all other requirements Future Appointments Date Type Provider Dept  12/30/21 Appointment Gillis Santa, MD [facility]  Showing future appointments within next 90 days and meeting all other  requirements Disposition: Discharge home  Discharge (Date  Time): 11/29/2021; 1319 hrs.   Primary Care Physician: [name], MD Location: [facility] Note by: Gillis Santa, MD Date: 11/29/2021; Time: 2:04 PM  Disclaimer:  Medicine is not an exact science. The only guarantee in medicine is that nothing is guaranteed. It is important to note that the decision to proceed with this intervention was based on the information collected from the patient. The Data and conclusions were drawn from the patient's questionnaire, the interview, and the physical examination. Because the information was provided in large part by the patient, it cannot be guaranteed that it has not been purposely or unconsciously manipulated. Every effort has been made to obtain as much relevant data as possible for this evaluation. It is important to note that the conclusions that lead to this procedure are derived in large part from the available data. Always take into account that the treatment will also be dependent on availability of resources and existing treatment guidelines, considered by other Pain Management Practitioners as being common knowledge and practice, at the time of the intervention. For Medico-Legal purposes, it is also important to point out that variation in procedural techniques and pharmacological choices are the acceptable norm. The indications, contraindications, technique, and results of the above procedure should only be interpreted and judged by a Board-Certified Interventional Pain Specialist with extensive familiarity and expertise in the same exact procedure and technique.

## 2021-11-29 NOTE — Progress Notes (Signed)
Safety precautions to be maintained throughout the outpatient stay will include: orient to surroundings, keep bed in low position, maintain call bell within reach at all times, provide assistance with transfer out of bed and ambulation.  

## 2021-11-30 ENCOUNTER — Telehealth: Payer: Self-pay | Admitting: *Deleted

## 2021-11-30 NOTE — Telephone Encounter (Signed)
No problems post procedure. 

## 2021-12-09 DIAGNOSIS — R300 Dysuria: Secondary | ICD-10-CM | POA: Diagnosis not present

## 2021-12-21 DIAGNOSIS — M5010 Cervical disc disorder with radiculopathy, unspecified cervical region: Secondary | ICD-10-CM | POA: Diagnosis not present

## 2021-12-21 DIAGNOSIS — M791 Myalgia, unspecified site: Secondary | ICD-10-CM | POA: Diagnosis not present

## 2021-12-28 DIAGNOSIS — M48062 Spinal stenosis, lumbar region with neurogenic claudication: Secondary | ICD-10-CM | POA: Diagnosis not present

## 2021-12-28 DIAGNOSIS — M4316 Spondylolisthesis, lumbar region: Secondary | ICD-10-CM | POA: Diagnosis not present

## 2021-12-28 DIAGNOSIS — M47816 Spondylosis without myelopathy or radiculopathy, lumbar region: Secondary | ICD-10-CM | POA: Diagnosis not present

## 2021-12-30 ENCOUNTER — Ambulatory Visit: Payer: PPO | Admitting: Student in an Organized Health Care Education/Training Program

## 2022-04-19 DIAGNOSIS — Z6836 Body mass index (BMI) 36.0-36.9, adult: Secondary | ICD-10-CM | POA: Diagnosis not present

## 2022-04-27 DIAGNOSIS — Z01810 Encounter for preprocedural cardiovascular examination: Secondary | ICD-10-CM | POA: Diagnosis not present

## 2022-04-27 DIAGNOSIS — J45902 Unspecified asthma with status asthmaticus: Secondary | ICD-10-CM | POA: Diagnosis not present

## 2022-04-27 DIAGNOSIS — R0789 Other chest pain: Secondary | ICD-10-CM | POA: Diagnosis not present

## 2022-04-27 DIAGNOSIS — J40 Bronchitis, not specified as acute or chronic: Secondary | ICD-10-CM | POA: Diagnosis not present

## 2022-05-03 DIAGNOSIS — J40 Bronchitis, not specified as acute or chronic: Secondary | ICD-10-CM | POA: Diagnosis not present

## 2022-05-03 DIAGNOSIS — J45902 Unspecified asthma with status asthmaticus: Secondary | ICD-10-CM | POA: Diagnosis not present

## 2022-05-18 ENCOUNTER — Other Ambulatory Visit: Payer: Self-pay | Admitting: Neurosurgery

## 2022-05-18 DIAGNOSIS — M4316 Spondylolisthesis, lumbar region: Secondary | ICD-10-CM

## 2022-05-23 DIAGNOSIS — E1169 Type 2 diabetes mellitus with other specified complication: Secondary | ICD-10-CM | POA: Diagnosis not present

## 2022-05-23 DIAGNOSIS — E782 Mixed hyperlipidemia: Secondary | ICD-10-CM | POA: Diagnosis not present

## 2022-05-27 DIAGNOSIS — E782 Mixed hyperlipidemia: Secondary | ICD-10-CM | POA: Diagnosis not present

## 2022-05-27 DIAGNOSIS — F330 Major depressive disorder, recurrent, mild: Secondary | ICD-10-CM | POA: Diagnosis not present

## 2022-05-27 DIAGNOSIS — N184 Chronic kidney disease, stage 4 (severe): Secondary | ICD-10-CM | POA: Diagnosis not present

## 2022-05-27 DIAGNOSIS — E1169 Type 2 diabetes mellitus with other specified complication: Secondary | ICD-10-CM | POA: Diagnosis not present

## 2022-05-27 DIAGNOSIS — Z0000 Encounter for general adult medical examination without abnormal findings: Secondary | ICD-10-CM | POA: Diagnosis not present

## 2022-05-27 DIAGNOSIS — N1832 Chronic kidney disease, stage 3b: Secondary | ICD-10-CM | POA: Diagnosis not present

## 2022-05-30 ENCOUNTER — Ambulatory Visit
Admission: RE | Admit: 2022-05-30 | Discharge: 2022-05-30 | Disposition: A | Discharge status: Home / Self Care | Payer: PPO | Source: Ambulatory Visit | Attending: Neurosurgery | Admitting: Neurosurgery

## 2022-05-30 DIAGNOSIS — M4316 Spondylolisthesis, lumbar region: Secondary | ICD-10-CM | POA: Diagnosis not present

## 2022-05-30 DIAGNOSIS — M545 Low back pain, unspecified: Secondary | ICD-10-CM | POA: Diagnosis not present

## 2022-05-30 IMAGING — MR MR LUMBAR SPINE W/O CM
5 series · 31 of 48 positions shown · non-contrast
Comparison: [DATE]

CLINICAL DATA: Chronic low back pain that is worsening

EXAM:
MRI LUMBAR SPINE WITHOUT CONTRAST
TECHNIQUE: Multiplanar, multisequence MR imaging of the lumbar spine was
performed. No intravenous contrast was administered.

[Series 5: T2 · sagittal · 4.0mm · 0.81mm/px · 6 of 14 slices shown (1 of 2)]
[im 1/14]
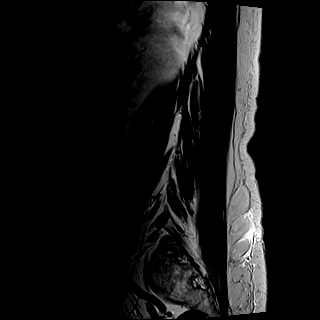
[im 3/14]
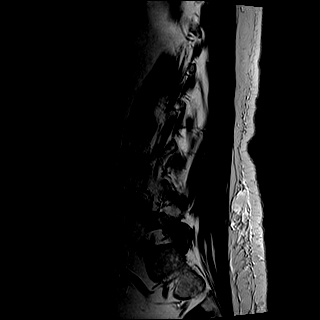
[im 6/14]
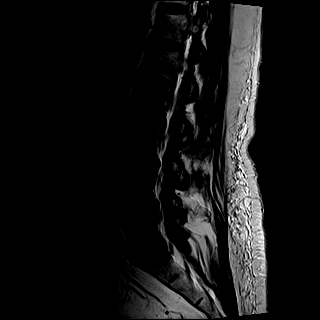
[im 8/14]
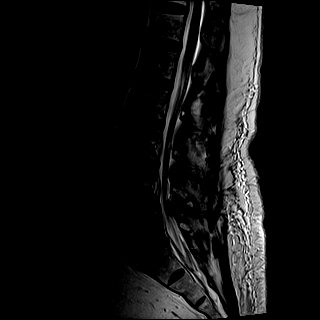
[im 11/14]
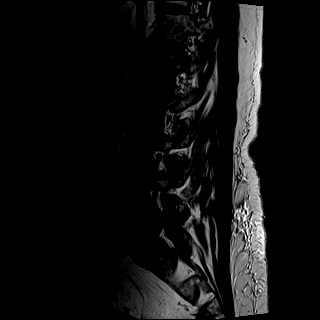
[im 14/14]
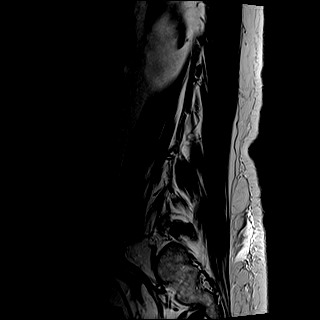

[Series 6: T1 · sagittal · 4.0mm · 0.81mm/px · 6 of 14 slices shown (1 of 2)]
[im 1/14]
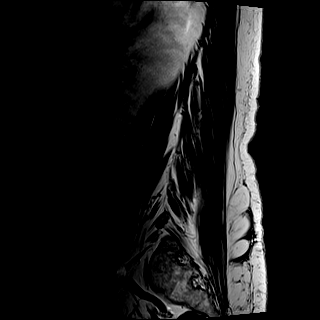
[im 3/14]
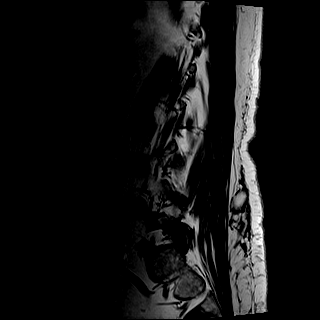
[im 6/14]
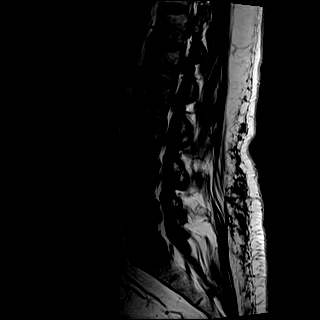
[im 8/14]
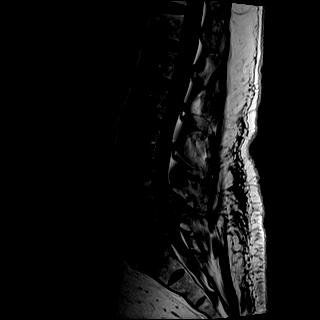
[im 11/14]
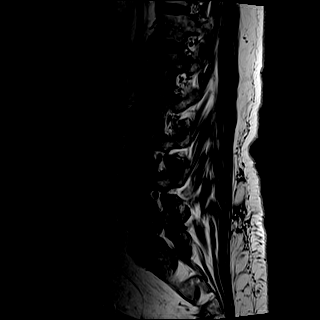
[im 14/14]
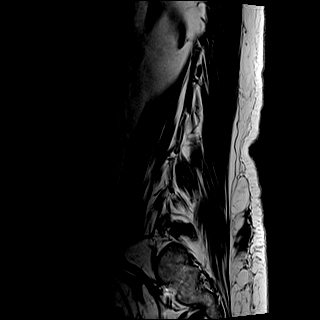

[Series 7: STIR · sagittal · 4.0mm · 0.41mm/px · 1 of 14 slices shown]
[im 1/14]
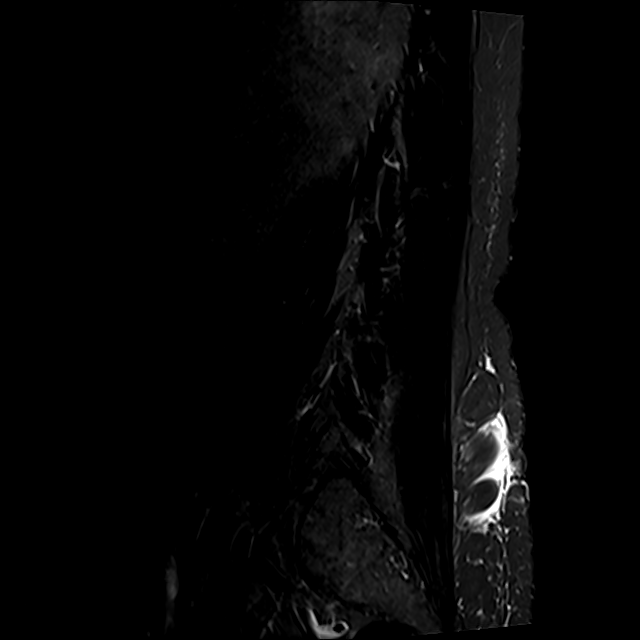

[Series 8: T2 · axial · 4.0mm · 0.78mm/px · z∈[-57,+155]mm · 9 of 34 slices shown (2 of 2)]
[im 1/34]
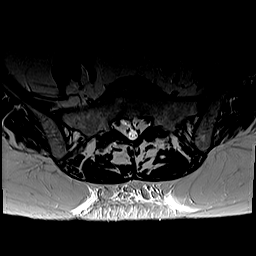
[im 5/34]
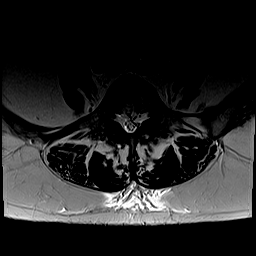
[im 10/34]
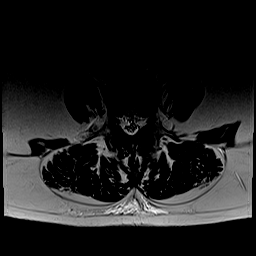
[im 15/34]
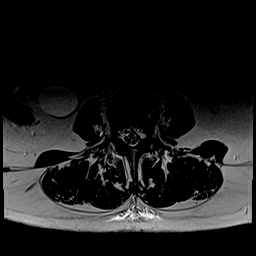
[im 17/34]
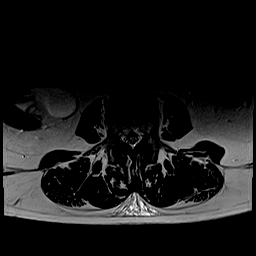
[im 19/34]
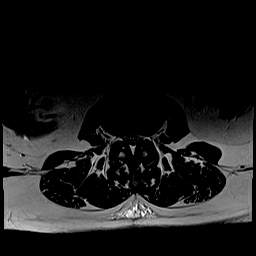
[im 24/34]
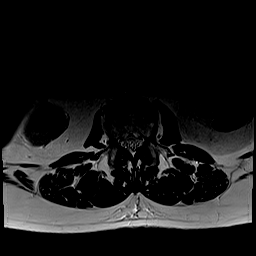
[im 29/34]
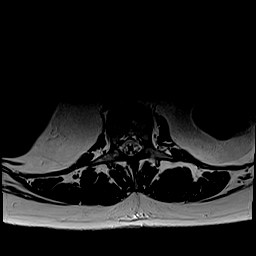
[im 34/34]
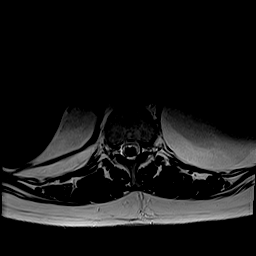

[Series 9: T1 · axial · 4.0mm · 0.39mm/px · z∈[-57,+155]mm · 9 of 34 slices shown (2 of 2)]
[im 1/34]
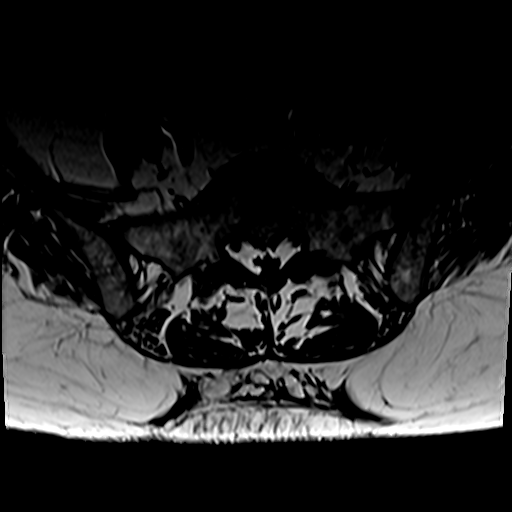
[im 5/34]
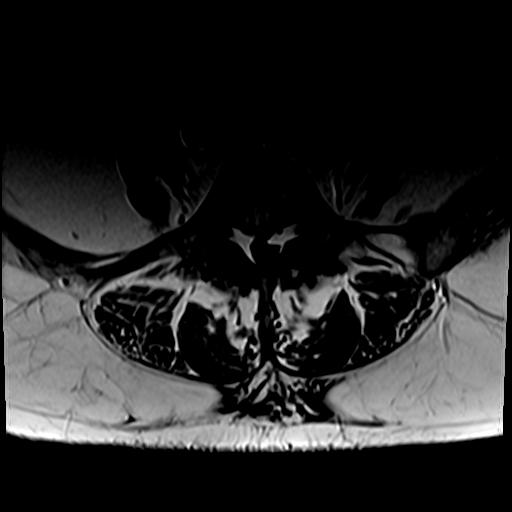
[im 10/34]
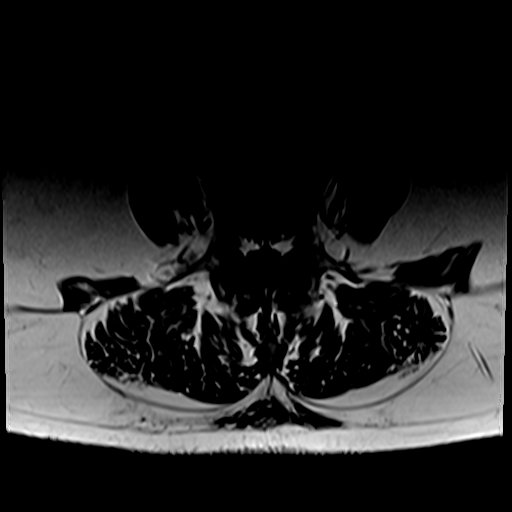
[im 15/34]
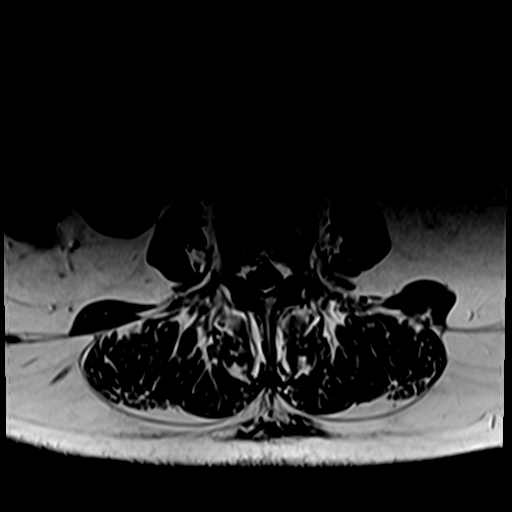
[im 17/34]
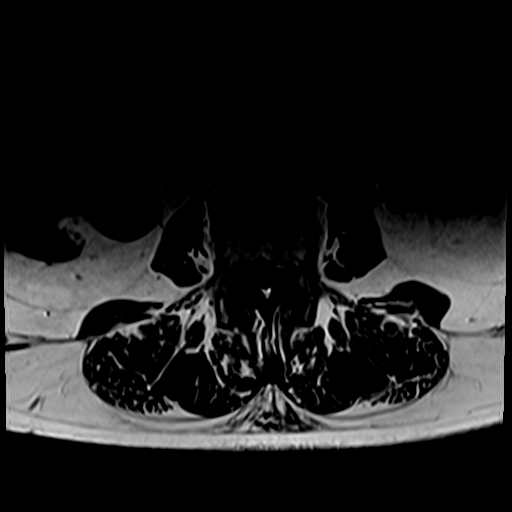
[im 19/34]
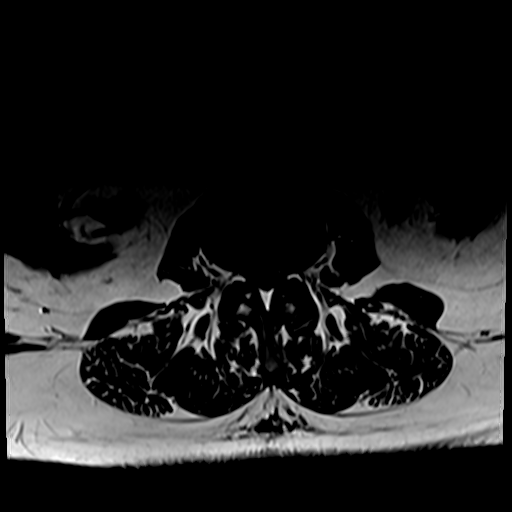
[im 24/34]
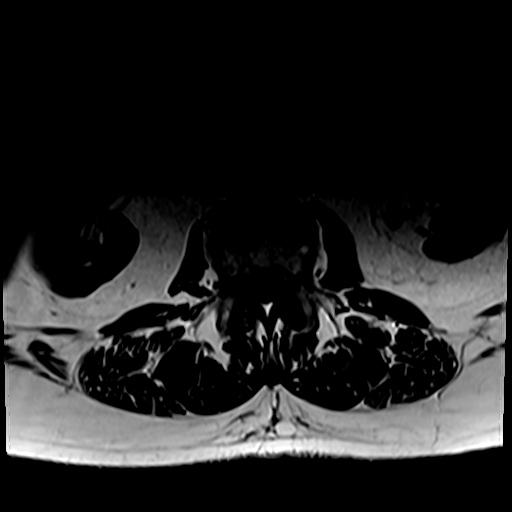
[im 29/34]
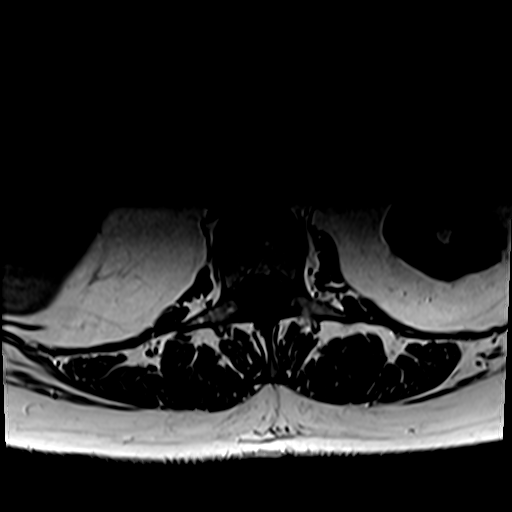
[im 34/34]
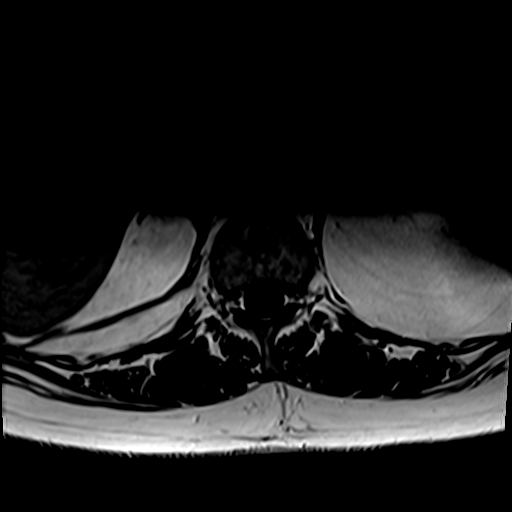

[31 of 48 positions shown; findings below may reference images not displayed]

FINDINGS: Segmentation:  5 lumbar type vertebrae

Alignment:  Grade 1 anterolisthesis at L4-5 and L5-S1

Vertebrae:  No fracture, evidence of discitis, or bone lesion.

Conus medullaris and cauda equina: Conus extends to the L1 level.
Conus and cauda equina appear normal.

Paraspinal and other soft tissues: Negative for perispinal mass or
inflammation

Disc levels:

T12- L1: Minor facet spurring

L1-L2: Unremarkable.

L2-L3: Degenerative facet spurring and mild disc bulging. No neural
compression

L3-L4: Moderate degenerative facet spurring. Mild disc bulging. Mild
to moderate spinal stenosis

L4-L5: Advanced facet osteoarthritis with spurring, joint
distortion, and ligamentum flavum thickening. Anterolisthesis that
is mild. Mild disc bulging. High-grade spinal stenosis that is
similar to before. Patent foramina

L5-S1:Facet osteoarthritis with spurring and joint distortion. Mild
anterolisthesis. Mild disc bulging. No neural impingement
IMPRESSION: 1. Stable compared to [DATE].
2. Lumbar spine degeneration especially affecting facets with L4-5
and L5-S1 mild anterolisthesis.
3. Spinal stenosis that is high-grade at L4-5 and moderate at L3-4.

## 2022-06-09 DIAGNOSIS — M48062 Spinal stenosis, lumbar region with neurogenic claudication: Secondary | ICD-10-CM | POA: Diagnosis not present

## 2022-06-09 DIAGNOSIS — M47816 Spondylosis without myelopathy or radiculopathy, lumbar region: Secondary | ICD-10-CM | POA: Diagnosis not present

## 2022-06-09 DIAGNOSIS — M4316 Spondylolisthesis, lumbar region: Secondary | ICD-10-CM | POA: Diagnosis not present

## 2022-06-09 DIAGNOSIS — Z6837 Body mass index (BMI) 37.0-37.9, adult: Secondary | ICD-10-CM | POA: Diagnosis not present

## 2022-06-10 ENCOUNTER — Other Ambulatory Visit: Payer: Self-pay | Admitting: Neurosurgery

## 2022-07-08 NOTE — Pre-Procedure Instructions (Signed)
Surgical Instructions    Your procedure is scheduled on July 18, 2022.  Report to [facility] Main Entrance "A" at 9:50 A.M., then check in with the Admitting office.  Call this number if you have problems the morning of surgery:  [phone]   If you have any questions prior to your surgery date call [phone]: Open Monday-Friday 8am-4pm    Remember:  Do not eat or drink after midnight the night before your surgery     Take these medicines the morning of surgery with A SIP OF WATER:  pantoprazole (PROTONIX)  pravastatin (PRAVACHOL)  sucralfate (CARAFATE)  venlafaxine XR (EFFEXOR-XR)    Take these medicines the morning of surgery AS NEEDED:  acetaminophen-codeine (TYLENOL #3)  ALPRAZolam (XANAX)   promethazine (PHENERGAN) suppository OR promethazine (PHENERGAN) tablet    As of today, STOP taking any Aspirin (unless otherwise instructed by your surgeon) Aleve, Naproxen, Ibuprofen, Motrin, Advil, Goody's, BC's, all herbal medications, fish oil, and all vitamins.  WHAT DO I DO ABOUT MY DIABETES MEDICATION?   Do not take metFORMIN (GLUCOPHAGE) the morning of surgery.   HOW TO MANAGE YOUR DIABETES BEFORE AND AFTER SURGERY  Why is it important to control my blood sugar before and after surgery? Improving blood sugar levels before and after surgery helps healing and can limit problems. A way of improving blood sugar control is eating a healthy diet by:  Eating less sugar and carbohydrates  Increasing activity/exercise  Talking with your doctor about reaching your blood sugar goals High blood sugars (greater than 180 mg/dL) can raise your risk of infections and slow your recovery, so you will need to focus on controlling your diabetes during the weeks before surgery. Make sure that the doctor who takes care of your diabetes knows about your planned surgery including the date and location.  How do I manage my blood sugar before surgery? Check your blood sugar at least 4  times a day, starting 2 days before surgery, to make sure that the level is not too high or low.  Check your blood sugar the morning of your surgery when you wake up and every 2 hours until you get to the Short Stay unit.  If your blood sugar is less than 70 mg/dL, you will need to treat for low blood sugar: Do not take insulin. Treat a low blood sugar (less than 70 mg/dL) with  cup of clear juice (cranberry or apple), 4 glucose tablets, OR glucose gel. Recheck blood sugar in 15 minutes after treatment (to make sure it is greater than 70 mg/dL). If your blood sugar is not greater than 70 mg/dL on recheck, call [phone] for further instructions. Report your blood sugar to the short stay nurse when you get to Short Stay.  If you are admitted to the hospital after surgery: Your blood sugar will be checked by the staff and you will probably be given insulin after surgery (instead of oral diabetes medicines) to make sure you have good blood sugar levels. The goal for blood sugar control after surgery is 80-180 mg/dL.                      Do NOT Smoke (Tobacco/Vaping) for 24 hours prior to your procedure.  If you use a CPAP at night, you may bring your mask/headgear for your overnight stay.   Contacts, glasses, piercing's, hearing aid's, dentures or partials may not be worn into surgery, please bring cases for these belongings.    For  patients admitted to the hospital, discharge time will be determined by your treatment team.   Patients discharged the day of surgery will not be allowed to drive home, and someone needs to stay with them for 24 hours.  SURGICAL WAITING ROOM VISITATION Patients having surgery or a procedure may have no more than 2 support people in the waiting area - these visitors may rotate.   Children under the age of 27 must have an adult with them who is not the patient. If the patient needs to stay at the hospital during part of their recovery, the visitor guidelines  for inpatient rooms apply. Pre-op nurse will coordinate an appropriate time for 1 support person to accompany patient in pre-op.  This support person may not rotate.   Please refer to the [facility] website for the visitor guidelines for Inpatients (after your surgery is over and you are in a regular room).    Special instructions:   [place]- Preparing For Surgery  Before surgery, you can play an important role. Because skin is not sterile, your skin needs to be as free of germs as possible. You can reduce the number of germs on your skin by washing with CHG (chlorahexidine gluconate) Soap before surgery.  CHG is an antiseptic cleaner which kills germs and bonds with the skin to continue killing germs even after washing.    Oral Hygiene is also important to reduce your risk of infection.  Remember - BRUSH YOUR TEETH THE MORNING OF SURGERY WITH YOUR REGULAR TOOTHPASTE  Please do not use if you have an allergy to CHG or antibacterial soaps. If your skin becomes reddened/irritated stop using the CHG.  Do not shave (including legs and underarms) for at least 48 hours prior to first CHG shower. It is OK to shave your face.  Please follow these instructions carefully.   Shower the NIGHT BEFORE SURGERY and the MORNING OF SURGERY  If you chose to wash your hair, wash your hair first as usual with your normal shampoo.  After you shampoo, rinse your hair and body thoroughly to remove the shampoo.  Use CHG Soap as you would any other liquid soap. You can apply CHG directly to the skin and wash gently with a scrungie or a clean washcloth.   Apply the CHG Soap to your body ONLY FROM THE NECK DOWN.  Do not use on open wounds or open sores. Avoid contact with your eyes, ears, mouth and genitals (private parts). Wash Face and genitals (private parts)  with your normal soap.   Wash thoroughly, paying special attention to the area where your surgery will be performed.  Thoroughly rinse your body with  warm water from the neck down.  DO NOT shower/wash with your normal soap after using and rinsing off the CHG Soap.  Pat yourself dry with a CLEAN TOWEL.  Wear CLEAN PAJAMAS to bed the night before surgery  Place CLEAN SHEETS on your bed the night before your surgery  DO NOT SLEEP WITH PETS.   Day of Surgery: Take a shower with CHG soap. Do not wear jewelry or makeup Do not wear lotions, powders, perfumes/colognes, or deodorant. Do not shave 48 hours prior to surgery.  Men may shave face and neck. Do not bring valuables to the hospital.  [facility] is not responsible for any belongings or valuables. Do not wear nail polish, gel polish, artificial nails, or any other type of covering on natural nails (fingers and toes) If you have artificial  nails or gel coating that need to be removed by a nail salon, please have this removed prior to surgery. Artificial nails or gel coating may interfere with anesthesia's ability to adequately monitor your vital signs.  Wear Clean/Comfortable clothing the morning of surgery  Remember to brush your teeth WITH YOUR REGULAR TOOTHPASTE.   Please read over the following fact sheets that you were given.    If you received a COVID test during your pre-op visit  it is requested that you wear a mask when out in public, stay away from anyone that may not be feeling well and notify your surgeon if you develop symptoms. If you have been in contact with anyone that has tested positive in the last 10 days please notify you surgeon.

## 2022-07-11 ENCOUNTER — Encounter (HOSPITAL_COMMUNITY)
Admission: RE | Admit: 2022-07-11 | Discharge: 2022-07-11 | Disposition: A | Discharge status: Home / Self Care | Payer: PPO | Source: Ambulatory Visit | Attending: Neurosurgery | Admitting: Neurosurgery

## 2022-07-11 ENCOUNTER — Other Ambulatory Visit: Payer: Self-pay

## 2022-07-11 ENCOUNTER — Encounter (HOSPITAL_COMMUNITY): Payer: Self-pay

## 2022-07-11 VITALS — BP 137/73 | HR 128 | Temp 98.0°F | Resp 17 | Ht 59.0 in | Wt 183.5 lb

## 2022-07-11 DIAGNOSIS — Z01812 Encounter for preprocedural laboratory examination: Secondary | ICD-10-CM | POA: Insufficient documentation

## 2022-07-11 DIAGNOSIS — I2510 Atherosclerotic heart disease of native coronary artery without angina pectoris: Secondary | ICD-10-CM | POA: Diagnosis not present

## 2022-07-11 DIAGNOSIS — E119 Type 2 diabetes mellitus without complications: Secondary | ICD-10-CM | POA: Diagnosis not present

## 2022-07-11 DIAGNOSIS — Z01818 Encounter for other preprocedural examination: Secondary | ICD-10-CM

## 2022-07-11 HISTORY — DX: Dyspnea, unspecified: R06.00

## 2022-07-11 HISTORY — DX: Chronic kidney disease, unspecified: N18.9

## 2022-07-11 HISTORY — DX: Pneumonia, unspecified organism: J18.9

## 2022-07-11 HISTORY — DX: Gastro-esophageal reflux disease without esophagitis: K21.9

## 2022-07-11 LAB — BASIC METABOLIC PANEL
Anion gap: 12 (ref 5–15)
BUN: 22 mg/dL (ref 8–23)
CO2: 24 mmol/L (ref 22–32)
Calcium: 9.2 mg/dL (ref 8.9–10.3)
Chloride: 100 mmol/L (ref 98–111)
Creatinine, Ser: 1.51 mg/dL — ABNORMAL HIGH (ref 0.44–1.00)
GFR, Estimated: 36 mL/min — ABNORMAL LOW (ref >60)
Glucose, Bld: 117 mg/dL — ABNORMAL HIGH (ref 70–99)
Potassium: 3.8 mmol/L (ref 3.5–5.1)
Sodium: 136 mmol/L (ref 135–145)

## 2022-07-11 LAB — SURGICAL PCR SCREEN
MRSA, PCR: NEGATIVE
Staphylococcus aureus: NEGATIVE

## 2022-07-11 LAB — CBC
HCT: 39.6 % (ref 36.0–46.0)
Hemoglobin: 13.1 g/dL (ref 12.0–15.0)
MCH: 29.9 pg (ref 26.0–34.0)
MCHC: 33.1 g/dL (ref 30.0–36.0)
MCV: 90.4 fL (ref 80.0–100.0)
Platelets: 317 10*3/uL (ref 150–400)
RBC: 4.38 MIL/uL (ref 3.87–5.11)
RDW: 12.8 % (ref 11.5–15.5)
WBC: 9.6 10*3/uL (ref 4.0–10.5)
nRBC: 0 % (ref 0.0–0.2)

## 2022-07-11 LAB — TYPE AND SCREEN
ABO/RH(D): O POS
Antibody Screen: NEGATIVE

## 2022-07-11 LAB — GLUCOSE, CAPILLARY: Glucose-Capillary: 137 mg/dL — ABNORMAL HIGH (ref 70–99)

## 2022-07-11 NOTE — Progress Notes (Signed)
PCP - Dr. Emily Filbert  Cardiologist - Denies  PPM/ICD - Denies Device Orders - n/a Rep Notified - n/a  Chest x-ray -  EKG - 04/27/2022 - tracing requested Stress Test - Per Patient, 15+ years ago ECHO - 02/02/2017 Cardiac Cath - Denies  Sleep Study - 03/04/2019 - positive for Sleep Apnea, but patient does not wear CPAP. Can't tolerate mask.   Pt has DM2. She does not have a meter at home to check her CBG. Last A1c was 6.1 on 05/23/2022 (in [place]). She only gets her sugar checked two times a year at the doctors office. Takes metformin daily. Says she does not check CBG regularly because her doctor has not told her to start doing that.  Blood Thinner Instructions: n/a Aspirin Instructions: Stop taking aspirin today  NPO after midnight  COVID TEST- n/a   Anesthesia review: Yes. Pts heart rate at PAT visit 128 with no other symptoms noted. Rechecked twice at end of visit and heart rate was still 128. BP 137/73. Discussed with Karoline Caldwell, PA-C during visit. Pt has recent EKG 04/27/2022 and tracing was requested. Patient checks heart rate at home a few times a week and it normally is 110-115. Her PCP is aware of high resting heart rate. Instructed to check heart rate more frequently at home this week and notify her PCP if it continues to stay high in upper 120s. Pt voiced understanding.  Patient denies shortness of breath, fever, cough and chest pain at PAT appointment   All instructions explained to the patient, with a verbal understanding of the material. Patient agrees to go over the instructions while at home for a better understanding. Patient also instructed to self quarantine after being tested for COVID-19. The opportunity to ask questions was provided.

## 2022-07-13 NOTE — Progress Notes (Signed)
Anesthesia Chart Review:  History of resting tachycardia for a number of years with baseline heart rate ~110.  This is followed by her PCP Dr. Emily Filbert.  Heart rate noted to be 128 at PAT (blood pressure 145/77), patient asymptomatic, no chest pain, shortness of breath, palpitations.  She had an echo in February 2018 that showed normal LV function, normal wall motion, no significant valvular abnormalities.  Most recent EKG 04/27/2022 showed sinus rhythm with PACs, rate 99.  Patient last seen by Dr. Sabra Heck 6/23 for annual exam.  Heart rate at that time 108.  A1c noted to be stable at 6.1, creatinine was up mildly to 1.8 -triamterene was changed to as needed and patient was encouraged to drink more water.  Dr. Sabra Heck previously cleared patient is low risk for surgery on 06/10/2022.  Copy of clearance on chart.  Patient monitors heart rate at home, she was encouraged to check this several times daily and if it is remaining elevated above her baseline she will reach out to Dr. Sabra Heck.  Otherwise, anticipate she can proceed with surgery as planned.  Preop labs reviewed, creatinine proved 1.51, otherwise unremarkable.  EKG 04/27/2022 (copy on chart): Sinus rhythm with PACs.  Rate 99.  TTE 02/02/2017: - Left ventricle: The cavity size was normal. Wall thickness was    normal. Systolic function was normal. The estimated ejection    fraction was in the range of 60% to 65%. Wall motion was normal;    there were no regional wall motion abnormalities.  - Aortic valve: Valve area (Vmax): 2.03 cm^2.   Impressions:   - normal left ventricular function    Normal overall wall motion    Ejection fraction greater than 60%    Normal right side. Normal study. No cardiac source of emboli was    indentified.     Wynonia Musty [facility] Short Stay Center/Anesthesiology Phone [phone] 07/13/2022 11:56 AM

## 2022-07-13 NOTE — Anesthesia Preprocedure Evaluation (Addendum)
Anesthesia Evaluation  Patient identified by MRN, date of birth, ID band Patient awake    Reviewed: Allergy & Precautions, NPO status , Patient's Chart, lab work & pertinent test results  History of Anesthesia Complications Negative for: history of anesthetic complications  Airway Mallampati: IV  TM Distance: <3 FB Neck ROM: Full    Dental  (+) Teeth Intact, Dental Advisory Given   Pulmonary shortness of breath, asthma , sleep apnea , COPD,    breath sounds clear to auscultation       Cardiovascular hypertension, Pt. on medications (-) angina(-) Past MI and (-) CHF  Rhythm:Regular     Neuro/Psych  Headaches, PSYCHIATRIC DISORDERS Anxiety Depression  Neuromuscular disease    GI/Hepatic Neg liver ROS, GERD  Medicated,  Endo/Other  diabetes, Type 2, Oral Hypoglycemic Agents  Renal/GU CRFRenal diseaseLab Results      Component                Value               Date                      CREATININE               1.51 (H)            07/11/2022           Lab Results      Component                Value               Date                      K                        3.8                 07/11/2022                Musculoskeletal  (+) Arthritis , Fibromyalgia -  Abdominal   Peds  Hematology negative hematology ROS (+) Lab Results      Component                Value               Date                      WBC                      9.6                 07/11/2022                HGB                      13.1                07/11/2022                HCT                      39.6                07/11/2022                MCV  90.4                07/11/2022                PLT                      317                 07/11/2022              Anesthesia Other Findings   Reproductive/Obstetrics                            Anesthesia Physical Anesthesia Plan  ASA: 3  Anesthesia Plan:  General   Post-op Pain Management: Ofirmev IV (intra-op)*   Induction: Intravenous  PONV Risk Score and Plan: 3 and Ondansetron and Dexamethasone  Airway Management Planned: Oral ETT  Additional Equipment: None  Intra-op Plan:   Post-operative Plan: Extubation in OR  Informed Consent: I have reviewed the patients History and Physical, chart, labs and discussed the procedure including the risks, benefits and alternatives for the proposed anesthesia with the patient or authorized representative who has indicated his/her understanding and acceptance.       Plan Discussed with:   Anesthesia Plan Comments: (PAT note by Karoline Caldwell, PA-C:  History of resting tachycardia for a number of years with baseline heart rate ~110.  This is followed by her PCP Dr. Emily Filbert.  Heart rate noted to be 128 at PAT (blood pressure 145/77), patient asymptomatic, no chest pain, shortness of breath, palpitations.  She had an echo in February 2018 that showed normal LV function, normal wall motion, no significant valvular abnormalities.  Most recent EKG 04/27/2022 showed sinus rhythm with PACs, rate 99.  Patient last seen by Dr. Sabra Heck 6/23 for annual exam.  Heart rate at that time 108.  A1c noted to be stable at 6.1, creatinine was up mildly to 1.8 -triamterene was changed to as needed and patient was encouraged to drink more water.  Dr. Sabra Heck previously cleared patient is low risk for surgery on 06/10/2022.  Copy of clearance on chart.  Patient monitors heart rate at home, she was encouraged to check this several times daily and if it is remaining elevated above her baseline she will reach out to Dr. Sabra Heck.  Otherwise, anticipate she can proceed with surgery as planned.  Preop labs reviewed, creatinine proved 1.51, otherwise unremarkable.  EKG 04/27/2022 (copy on chart): Sinus rhythm with PACs.  Rate 99.  TTE 02/02/2017: - Left ventricle: The cavity size was normal. Wall thickness was  normal.  Systolic function was normal. The estimated ejection  fraction was in the range of 60% to 65%. Wall motion was normal;  there were no regional wall motion abnormalities.  - Aortic valve: Valve area (Vmax): 2.03 cm^2.   Impressions:   - normal left ventricular function  Normal overall wall motion  Ejection fraction greater than 60%  Normal right side. Normal study. No cardiac source of emboli was  indentified.   )       Anesthesia Quick Evaluation

## 2022-07-18 ENCOUNTER — Ambulatory Visit (HOSPITAL_COMMUNITY)
Admission: RE | Disposition: A | Discharge status: Home / Self Care | Payer: Self-pay | Source: Home / Self Care | Attending: Neurosurgery

## 2022-07-18 ENCOUNTER — Ambulatory Visit (HOSPITAL_COMMUNITY): Payer: PPO | Admitting: Physician Assistant

## 2022-07-18 ENCOUNTER — Ambulatory Visit (HOSPITAL_BASED_OUTPATIENT_CLINIC_OR_DEPARTMENT_OTHER): Payer: PPO | Admitting: Certified Registered"

## 2022-07-18 ENCOUNTER — Ambulatory Visit (HOSPITAL_COMMUNITY): Payer: PPO

## 2022-07-18 ENCOUNTER — Ambulatory Visit (HOSPITAL_COMMUNITY)
Admission: RE | Admit: 2022-07-18 | Discharge: 2022-07-20 | Disposition: A | Discharge status: Home / Self Care | Payer: PPO | Attending: Neurosurgery | Admitting: Neurosurgery

## 2022-07-18 ENCOUNTER — Encounter (HOSPITAL_COMMUNITY): Payer: Self-pay | Admitting: Neurosurgery

## 2022-07-18 ENCOUNTER — Other Ambulatory Visit: Payer: Self-pay

## 2022-07-18 DIAGNOSIS — M4326 Fusion of spine, lumbar region: Secondary | ICD-10-CM | POA: Diagnosis not present

## 2022-07-18 DIAGNOSIS — Z981 Arthrodesis status: Secondary | ICD-10-CM | POA: Diagnosis not present

## 2022-07-18 DIAGNOSIS — F419 Anxiety disorder, unspecified: Secondary | ICD-10-CM | POA: Insufficient documentation

## 2022-07-18 DIAGNOSIS — E1122 Type 2 diabetes mellitus with diabetic chronic kidney disease: Secondary | ICD-10-CM | POA: Diagnosis not present

## 2022-07-18 DIAGNOSIS — M4726 Other spondylosis with radiculopathy, lumbar region: Secondary | ICD-10-CM | POA: Insufficient documentation

## 2022-07-18 DIAGNOSIS — M4316 Spondylolisthesis, lumbar region: Secondary | ICD-10-CM | POA: Insufficient documentation

## 2022-07-18 DIAGNOSIS — E119 Type 2 diabetes mellitus without complications: Secondary | ICD-10-CM

## 2022-07-18 DIAGNOSIS — M47896 Other spondylosis, lumbar region: Secondary | ICD-10-CM | POA: Diagnosis not present

## 2022-07-18 DIAGNOSIS — N183 Chronic kidney disease, stage 3 unspecified: Secondary | ICD-10-CM | POA: Insufficient documentation

## 2022-07-18 DIAGNOSIS — M1990 Unspecified osteoarthritis, unspecified site: Secondary | ICD-10-CM | POA: Diagnosis not present

## 2022-07-18 DIAGNOSIS — J449 Chronic obstructive pulmonary disease, unspecified: Secondary | ICD-10-CM | POA: Diagnosis not present

## 2022-07-18 DIAGNOSIS — I10 Essential (primary) hypertension: Secondary | ICD-10-CM | POA: Diagnosis not present

## 2022-07-18 DIAGNOSIS — M48062 Spinal stenosis, lumbar region with neurogenic claudication: Secondary | ICD-10-CM | POA: Insufficient documentation

## 2022-07-18 DIAGNOSIS — F32A Depression, unspecified: Secondary | ICD-10-CM | POA: Diagnosis not present

## 2022-07-18 DIAGNOSIS — I129 Hypertensive chronic kidney disease with stage 1 through stage 4 chronic kidney disease, or unspecified chronic kidney disease: Secondary | ICD-10-CM | POA: Diagnosis not present

## 2022-07-18 DIAGNOSIS — M797 Fibromyalgia: Secondary | ICD-10-CM | POA: Insufficient documentation

## 2022-07-18 DIAGNOSIS — K219 Gastro-esophageal reflux disease without esophagitis: Secondary | ICD-10-CM | POA: Diagnosis not present

## 2022-07-18 DIAGNOSIS — M5416 Radiculopathy, lumbar region: Secondary | ICD-10-CM | POA: Diagnosis not present

## 2022-07-18 DIAGNOSIS — G4730 Sleep apnea, unspecified: Secondary | ICD-10-CM | POA: Insufficient documentation

## 2022-07-18 LAB — GLUCOSE, CAPILLARY
Glucose-Capillary: 138 mg/dL — ABNORMAL HIGH (ref 70–99)
Glucose-Capillary: 186 mg/dL — ABNORMAL HIGH (ref 70–99)
Glucose-Capillary: 257 mg/dL — ABNORMAL HIGH (ref 70–99)

## 2022-07-18 LAB — HEMOGLOBIN A1C
Hgb A1c MFr Bld: 6.2 % — ABNORMAL HIGH (ref 4.8–5.6)
Mean Plasma Glucose: 131.24 mg/dL

## 2022-07-18 LAB — ABO/RH: ABO/RH(D): O POS

## 2022-07-18 SURGERY — POSTERIOR LUMBAR FUSION 1 LEVEL
Anesthesia: General | Site: Spine Lumbar

## 2022-07-18 MED ORDER — ACETAMINOPHEN 500 MG PO TABS: 1000.0000 mg | ORAL_TABLET | Freq: Once | ORAL | Status: DC | PRN

## 2022-07-18 MED ORDER — ONDANSETRON HCL 4 MG/2ML IJ SOLN
INTRAMUSCULAR | Status: AC
Start: 2022-07-18 — End: ?
  Filled 2022-07-18: qty 2

## 2022-07-18 MED ORDER — PROPOFOL 10 MG/ML IV BOLUS
INTRAVENOUS | Status: AC
  Filled 2022-07-18: qty 20

## 2022-07-18 MED ORDER — CHLORHEXIDINE GLUCONATE CLOTH 2 % EX PADS: 6.0000 | MEDICATED_PAD | Freq: Once | CUTANEOUS | Status: DC

## 2022-07-18 MED ORDER — PANTOPRAZOLE SODIUM 40 MG PO TBEC
40.0000 mg | DELAYED_RELEASE_TABLET | Freq: Two times a day (BID) | ORAL | Status: DC
  Administered 2022-07-18 – 2022-07-20 (×4): 40 mg via ORAL
  Filled 2022-07-18 (×4): qty 1

## 2022-07-18 MED ORDER — SODIUM CHLORIDE 0.9% FLUSH
3.0000 mL | Freq: Two times a day (BID) | INTRAVENOUS | Status: DC
Start: 2022-07-18 — End: 2022-07-20
  Administered 2022-07-19 – 2022-07-20 (×3): 3 mL via INTRAVENOUS

## 2022-07-18 MED ORDER — PROPOFOL 10 MG/ML IV BOLUS
INTRAVENOUS | Status: DC | PRN
  Administered 2022-07-18: 130 mg via INTRAVENOUS

## 2022-07-18 MED ORDER — TOPIRAMATE 100 MG PO TABS
100.0000 mg | ORAL_TABLET | Freq: Every day | ORAL | Status: DC
  Administered 2022-07-18 – 2022-07-19 (×2): 100 mg via ORAL
  Filled 2022-07-18 (×2): qty 1

## 2022-07-18 MED ORDER — FENTANYL CITRATE (PF) 100 MCG/2ML IJ SOLN: 25.0000 ug | INTRAMUSCULAR | Status: DC | PRN

## 2022-07-18 MED ORDER — ACETAMINOPHEN 10 MG/ML IV SOLN
INTRAVENOUS | Status: DC | PRN
  Administered 2022-07-18: 1000 mg via INTRAVENOUS

## 2022-07-18 MED ORDER — LIDOCAINE 2% (20 MG/ML) 5 ML SYRINGE
INTRAMUSCULAR | Status: DC | PRN
  Administered 2022-07-18: 60 mg via INTRAVENOUS

## 2022-07-18 MED ORDER — THROMBIN 5000 UNITS EX SOLR
CUTANEOUS | Status: AC
Start: 2022-07-18 — End: ?
  Filled 2022-07-18: qty 5000

## 2022-07-18 MED ORDER — BACITRACIN ZINC 500 UNIT/GM EX OINT
TOPICAL_OINTMENT | CUTANEOUS | Status: DC | PRN
  Administered 2022-07-18: 1 via TOPICAL

## 2022-07-18 MED ORDER — BUPIVACAINE-EPINEPHRINE 0.5% -1:200000 IJ SOLN
INTRAMUSCULAR | Status: AC
  Filled 2022-07-18: qty 1

## 2022-07-18 MED ORDER — MIDAZOLAM HCL 2 MG/2ML IJ SOLN
INTRAMUSCULAR | Status: AC
  Filled 2022-07-18: qty 2

## 2022-07-18 MED ORDER — DEXAMETHASONE SODIUM PHOSPHATE 10 MG/ML IJ SOLN
INTRAMUSCULAR | Status: AC
  Filled 2022-07-18: qty 1

## 2022-07-18 MED ORDER — ACETAMINOPHEN 10 MG/ML IV SOLN
INTRAVENOUS | Status: AC
  Filled 2022-07-18: qty 100

## 2022-07-18 MED ORDER — OXYCODONE HCL 5 MG PO TABS: 5.0000 mg | ORAL_TABLET | ORAL | Status: DC | PRN

## 2022-07-18 MED ORDER — PHENOL 1.4 % MT LIQD: 1.0000 | OROMUCOSAL | Status: DC | PRN

## 2022-07-18 MED ORDER — BUPIVACAINE LIPOSOME 1.3 % IJ SUSP
INTRAMUSCULAR | Status: DC | PRN
  Administered 2022-07-18: 20 mL

## 2022-07-18 MED ORDER — FENTANYL CITRATE (PF) 250 MCG/5ML IJ SOLN
INTRAMUSCULAR | Status: AC
  Filled 2022-07-18: qty 5

## 2022-07-18 MED ORDER — PHENYLEPHRINE HCL-NACL 20-0.9 MG/250ML-% IV SOLN
INTRAVENOUS | Status: AC
Start: 2022-07-18 — End: ?
  Filled 2022-07-18: qty 250

## 2022-07-18 MED ORDER — THROMBIN 5000 UNITS EX SOLR
CUTANEOUS | Status: AC
  Filled 2022-07-18: qty 5000

## 2022-07-18 MED ORDER — ACETAMINOPHEN 325 MG PO TABS
650.0000 mg | ORAL_TABLET | ORAL | Status: DC | PRN
  Administered 2022-07-19 – 2022-07-20 (×2): 650 mg via ORAL
  Filled 2022-07-18 (×2): qty 2

## 2022-07-18 MED ORDER — BISACODYL 10 MG RE SUPP: 10.0000 mg | Freq: Every day | RECTAL | Status: DC | PRN

## 2022-07-18 MED ORDER — THROMBIN 5000 UNITS EX SOLR
OROMUCOSAL | Status: DC | PRN
  Administered 2022-07-18 (×2): 5 mL via TOPICAL

## 2022-07-18 MED ORDER — CEFAZOLIN SODIUM-DEXTROSE 2-4 GM/100ML-% IV SOLN
2.0000 g | Freq: Three times a day (TID) | INTRAVENOUS | Status: AC
  Administered 2022-07-18 – 2022-07-19 (×2): 2 g via INTRAVENOUS
  Filled 2022-07-18 (×2): qty 100

## 2022-07-18 MED ORDER — PRAVASTATIN SODIUM 40 MG PO TABS
80.0000 mg | ORAL_TABLET | Freq: Every day | ORAL | Status: DC
  Administered 2022-07-19 – 2022-07-20 (×2): 80 mg via ORAL
  Filled 2022-07-18 (×2): qty 2

## 2022-07-18 MED ORDER — ALPRAZOLAM 0.5 MG PO TABS: 0.5000 mg | ORAL_TABLET | Freq: Two times a day (BID) | ORAL | Status: DC | PRN

## 2022-07-18 MED ORDER — ONDANSETRON HCL 4 MG/2ML IJ SOLN
INTRAMUSCULAR | Status: DC | PRN
  Administered 2022-07-18: 4 mg via INTRAVENOUS

## 2022-07-18 MED ORDER — PHENYLEPHRINE HCL-NACL 20-0.9 MG/250ML-% IV SOLN
INTRAVENOUS | Status: DC | PRN
  Administered 2022-07-18: 25 ug/min via INTRAVENOUS

## 2022-07-18 MED ORDER — SODIUM CHLORIDE 0.9% FLUSH: 3.0000 mL | INTRAVENOUS | Status: DC | PRN

## 2022-07-18 MED ORDER — ONDANSETRON HCL 4 MG/2ML IJ SOLN
4.0000 mg | Freq: Four times a day (QID) | INTRAMUSCULAR | Status: DC | PRN
Start: 2022-07-18 — End: 2022-07-20

## 2022-07-18 MED ORDER — CEFAZOLIN SODIUM-DEXTROSE 2-4 GM/100ML-% IV SOLN
2.0000 g | INTRAVENOUS | Status: AC
  Administered 2022-07-18: 2 g via INTRAVENOUS
  Filled 2022-07-18: qty 100

## 2022-07-18 MED ORDER — OXYCODONE HCL 5 MG PO TABS: 5.0000 mg | ORAL_TABLET | Freq: Once | ORAL | Status: DC | PRN

## 2022-07-18 MED ORDER — DOCUSATE SODIUM 100 MG PO CAPS
100.0000 mg | ORAL_CAPSULE | Freq: Two times a day (BID) | ORAL | Status: DC
Start: 2022-07-18 — End: 2022-07-20
  Administered 2022-07-18 – 2022-07-20 (×4): 100 mg via ORAL
  Filled 2022-07-18 (×4): qty 1

## 2022-07-18 MED ORDER — ROCURONIUM BROMIDE 10 MG/ML (PF) SYRINGE
PREFILLED_SYRINGE | INTRAVENOUS | Status: AC
  Filled 2022-07-18: qty 10

## 2022-07-18 MED ORDER — OXYCODONE HCL 5 MG PO TABS
10.0000 mg | ORAL_TABLET | ORAL | Status: DC | PRN
  Administered 2022-07-18 – 2022-07-20 (×8): 10 mg via ORAL
  Filled 2022-07-18 (×8): qty 2

## 2022-07-18 MED ORDER — OXYCODONE HCL 5 MG/5ML PO SOLN: 5.0000 mg | Freq: Once | ORAL | Status: DC | PRN

## 2022-07-18 MED ORDER — ACETAMINOPHEN 160 MG/5ML PO SOLN: 1000.0000 mg | Freq: Once | ORAL | Status: DC | PRN

## 2022-07-18 MED ORDER — ACETAMINOPHEN 650 MG RE SUPP: 650.0000 mg | RECTAL | Status: DC | PRN

## 2022-07-18 MED ORDER — BACITRACIN ZINC 500 UNIT/GM EX OINT
TOPICAL_OINTMENT | CUTANEOUS | Status: AC
  Filled 2022-07-18: qty 28.35

## 2022-07-18 MED ORDER — SUFENTANIL CITRATE 50 MCG/ML IV SOLN
INTRAVENOUS | Status: AC
  Filled 2022-07-18: qty 1

## 2022-07-18 MED ORDER — CHLORHEXIDINE GLUCONATE 0.12 % MT SOLN
15.0000 mL | Freq: Once | OROMUCOSAL | Status: AC
  Administered 2022-07-18: 15 mL via OROMUCOSAL
  Filled 2022-07-18: qty 15

## 2022-07-18 MED ORDER — VENLAFAXINE HCL ER 75 MG PO CP24
75.0000 mg | ORAL_CAPSULE | Freq: Every day | ORAL | Status: DC
  Administered 2022-07-19 – 2022-07-20 (×2): 75 mg via ORAL
  Filled 2022-07-18 (×2): qty 1

## 2022-07-18 MED ORDER — INSULIN ASPART 100 UNIT/ML IJ SOLN: 0.0000 [IU] | INTRAMUSCULAR | Status: DC

## 2022-07-18 MED ORDER — SUCRALFATE 1 G PO TABS
1.0000 g | ORAL_TABLET | Freq: Two times a day (BID) | ORAL | Status: DC
  Administered 2022-07-18 – 2022-07-20 (×4): 1 g via ORAL
  Filled 2022-07-18 (×7): qty 1

## 2022-07-18 MED ORDER — ARTIFICIAL TEARS OPHTHALMIC OINT
TOPICAL_OINTMENT | OPHTHALMIC | Status: AC
Start: 2022-07-18 — End: ?
  Filled 2022-07-18: qty 3.5

## 2022-07-18 MED ORDER — SODIUM CHLORIDE 0.9 % IV SOLN: 250.0000 mL | INTRAVENOUS | Status: DC

## 2022-07-18 MED ORDER — CYCLOBENZAPRINE HCL 10 MG PO TABS
10.0000 mg | ORAL_TABLET | Freq: Three times a day (TID) | ORAL | Status: DC | PRN
  Administered 2022-07-18 – 2022-07-19 (×3): 10 mg via ORAL
  Filled 2022-07-18 (×3): qty 1

## 2022-07-18 MED ORDER — ACETAMINOPHEN 10 MG/ML IV SOLN: 1000.0000 mg | Freq: Once | INTRAVENOUS | Status: DC | PRN

## 2022-07-18 MED ORDER — IRBESARTAN 150 MG PO TABS: 150.0000 mg | ORAL_TABLET | Freq: Every day | ORAL | Status: DC

## 2022-07-18 MED ORDER — ROCURONIUM BROMIDE 10 MG/ML (PF) SYRINGE
PREFILLED_SYRINGE | INTRAVENOUS | Status: DC | PRN
  Administered 2022-07-18: 80 mg via INTRAVENOUS

## 2022-07-18 MED ORDER — LIDOCAINE 2% (20 MG/ML) 5 ML SYRINGE
INTRAMUSCULAR | Status: AC
  Filled 2022-07-18: qty 5

## 2022-07-18 MED ORDER — BUPIVACAINE-EPINEPHRINE (PF) 0.5% -1:200000 IJ SOLN
INTRAMUSCULAR | Status: DC | PRN
  Administered 2022-07-18: 10 mL via PERINEURAL

## 2022-07-18 MED ORDER — LACTATED RINGERS IV SOLN: INTRAVENOUS | Status: DC

## 2022-07-18 MED ORDER — BUPIVACAINE LIPOSOME 1.3 % IJ SUSP
INTRAMUSCULAR | Status: AC
  Filled 2022-07-18: qty 20

## 2022-07-18 MED ORDER — HYDROMORPHONE HCL 1 MG/ML IJ SOLN
INTRAMUSCULAR | Status: AC
  Filled 2022-07-18: qty 0.5

## 2022-07-18 MED ORDER — SUFENTANIL CITRATE 50 MCG/ML IV SOLN
INTRAVENOUS | Status: DC | PRN
  Administered 2022-07-18: 15 ug via INTRAVENOUS
  Administered 2022-07-18: 10 ug via INTRAVENOUS
  Administered 2022-07-18 (×3): 5 ug via INTRAVENOUS

## 2022-07-18 MED ORDER — INSULIN ASPART 100 UNIT/ML IJ SOLN: 0.0000 [IU] | INTRAMUSCULAR | Status: DC | PRN

## 2022-07-18 MED ORDER — ZOLPIDEM TARTRATE 5 MG PO TABS: 5.0000 mg | ORAL_TABLET | Freq: Every evening | ORAL | Status: DC | PRN

## 2022-07-18 MED ORDER — 0.9 % SODIUM CHLORIDE (POUR BTL) OPTIME
TOPICAL | Status: DC | PRN
  Administered 2022-07-18: 1000 mL

## 2022-07-18 MED ORDER — MENTHOL 3 MG MT LOZG: 1.0000 | LOZENGE | OROMUCOSAL | Status: DC | PRN

## 2022-07-18 MED ORDER — MIDAZOLAM HCL 2 MG/2ML IJ SOLN
INTRAMUSCULAR | Status: DC | PRN
  Administered 2022-07-18: 2 mg via INTRAVENOUS

## 2022-07-18 MED ORDER — PHENYLEPHRINE 80 MCG/ML (10ML) SYRINGE FOR IV PUSH (FOR BLOOD PRESSURE SUPPORT)
PREFILLED_SYRINGE | INTRAVENOUS | Status: DC | PRN
  Administered 2022-07-18: 80 ug via INTRAVENOUS
  Administered 2022-07-18: 160 ug via INTRAVENOUS

## 2022-07-18 MED ORDER — TRIAMTERENE-HCTZ 37.5-25 MG PO TABS
1.0000 | ORAL_TABLET | Freq: Every day | ORAL | Status: DC
Start: 2022-07-19 — End: 2022-07-20
  Administered 2022-07-20: 1 via ORAL
  Filled 2022-07-18 (×2): qty 1

## 2022-07-18 MED ORDER — TRAZODONE HCL 50 MG PO TABS
100.0000 mg | ORAL_TABLET | Freq: Every day | ORAL | Status: DC
  Administered 2022-07-18 – 2022-07-19 (×2): 100 mg via ORAL
  Filled 2022-07-18 (×2): qty 2

## 2022-07-18 MED ORDER — ORAL CARE MOUTH RINSE: 15.0000 mL | Freq: Once | OROMUCOSAL | Status: AC

## 2022-07-18 MED ORDER — METFORMIN HCL 500 MG PO TABS
500.0000 mg | ORAL_TABLET | Freq: Every day | ORAL | Status: DC
  Administered 2022-07-19 – 2022-07-20 (×2): 500 mg via ORAL
  Filled 2022-07-18 (×2): qty 1

## 2022-07-18 MED ORDER — ACETAMINOPHEN 500 MG PO TABS
1000.0000 mg | ORAL_TABLET | Freq: Four times a day (QID) | ORAL | Status: AC
  Administered 2022-07-18 – 2022-07-19 (×3): 1000 mg via ORAL
  Filled 2022-07-18 (×4): qty 2

## 2022-07-18 MED ORDER — ONDANSETRON HCL 4 MG PO TABS: 4.0000 mg | ORAL_TABLET | Freq: Four times a day (QID) | ORAL | Status: DC | PRN

## 2022-07-18 MED ORDER — DEXAMETHASONE SODIUM PHOSPHATE 10 MG/ML IJ SOLN
INTRAMUSCULAR | Status: DC | PRN
  Administered 2022-07-18: 5 mg via INTRAVENOUS

## 2022-07-18 MED ORDER — INSULIN ASPART 100 UNIT/ML IJ SOLN
0.0000 [IU] | Freq: Three times a day (TID) | INTRAMUSCULAR | Status: DC
  Administered 2022-07-18: 4 [IU] via SUBCUTANEOUS
  Administered 2022-07-19 (×2): 3 [IU] via SUBCUTANEOUS
  Administered 2022-07-20: 4 [IU] via SUBCUTANEOUS

## 2022-07-18 MED ORDER — MORPHINE SULFATE (PF) 4 MG/ML IV SOLN: 4.0000 mg | INTRAVENOUS | Status: DC | PRN

## 2022-07-18 MED ORDER — PROMETHAZINE HCL 25 MG PO TABS: 25.0000 mg | ORAL_TABLET | Freq: Four times a day (QID) | ORAL | Status: DC | PRN

## 2022-07-18 SURGICAL SUPPLY — 72 items
APL SKNCLS STERI-STRIP NONHPOA (GAUZE/BANDAGES/DRESSINGS) ×1
BAG COUNTER SPONGE SURGICOUNT (BAG) ×2 IMPLANT
BAG SPNG CNTER NS LX DISP (BAG) ×1
BASKET BONE COLLECTION (BASKET) ×2 IMPLANT
BENZOIN TINCTURE PRP APPL 2/3 (GAUZE/BANDAGES/DRESSINGS) ×2 IMPLANT
BLADE CLIPPER SURG (BLADE) IMPLANT
BUR MATCHSTICK NEURO 3.0 LAGG (BURR) ×2 IMPLANT
BUR PRECISION FLUTE 6.0 (BURR) ×2 IMPLANT
CANISTER SUCT 3000ML PPV (MISCELLANEOUS) ×2 IMPLANT
CAP LOCK DLX THRD (Cap) ×4 IMPLANT
CARTRIDGE OIL MAESTRO DRILL (MISCELLANEOUS) ×1 IMPLANT
CNTNR URN SCR LID CUP LEK RST (MISCELLANEOUS) ×1 IMPLANT
CONT SPEC 4OZ STRL OR WHT (MISCELLANEOUS) ×2
COVER BACK TABLE 60X90IN (DRAPES) ×2 IMPLANT
DIFFUSER DRILL AIR PNEUMATIC (MISCELLANEOUS) ×2 IMPLANT
DRAPE C-ARM 42X72 X-RAY (DRAPES) ×4 IMPLANT
DRAPE HALF SHEET 40X57 (DRAPES) ×2 IMPLANT
DRAPE LAPAROTOMY 100X72X124 (DRAPES) ×2 IMPLANT
DRAPE SURG 17X23 STRL (DRAPES) ×4 IMPLANT
DRSG OPSITE POSTOP 4X6 (GAUZE/BANDAGES/DRESSINGS) ×2 IMPLANT
ELECT BLADE 4.0 EZ CLEAN MEGAD (MISCELLANEOUS) ×2
ELECT REM PT RETURN 9FT ADLT (ELECTROSURGICAL) ×2
ELECTRODE BLDE 4.0 EZ CLN MEGD (MISCELLANEOUS) ×1 IMPLANT
ELECTRODE REM PT RTRN 9FT ADLT (ELECTROSURGICAL) ×1 IMPLANT
EVACUATOR 1/8 PVC DRAIN (DRAIN) IMPLANT
GAUZE 4X4 16PLY ~~LOC~~+RFID DBL (SPONGE) ×2 IMPLANT
GLOVE BIO SURGEON STRL SZ 6 (GLOVE) ×2 IMPLANT
GLOVE BIO SURGEON STRL SZ8 (GLOVE) ×4 IMPLANT
GLOVE BIO SURGEON STRL SZ8.5 (GLOVE) ×4 IMPLANT
GLOVE BIOGEL PI IND STRL 6.5 (GLOVE) ×1 IMPLANT
GLOVE BIOGEL PI IND STRL 7.5 (GLOVE) IMPLANT
GLOVE BIOGEL PI INDICATOR 6.5 (GLOVE) ×2
GLOVE BIOGEL PI INDICATOR 7.5 (GLOVE) ×1
GLOVE ECLIPSE 7.5 STRL STRAW (GLOVE) ×1 IMPLANT
GLOVE EXAM NITRILE XL STR (GLOVE) IMPLANT
GLOVE INDICATOR 7.5 STRL GRN (GLOVE) ×2 IMPLANT
GOWN STRL REUS W/ TWL LRG LVL3 (GOWN DISPOSABLE) ×1 IMPLANT
GOWN STRL REUS W/ TWL XL LVL3 (GOWN DISPOSABLE) ×2 IMPLANT
GOWN STRL REUS W/TWL 2XL LVL3 (GOWN DISPOSABLE) IMPLANT
GOWN STRL REUS W/TWL LRG LVL3 (GOWN DISPOSABLE) ×6
GOWN STRL REUS W/TWL XL LVL3 (GOWN DISPOSABLE) ×4
HEMOSTAT POWDER KIT SURGIFOAM (HEMOSTASIS) ×3 IMPLANT
KIT BASIN OR (CUSTOM PROCEDURE TRAY) ×2 IMPLANT
KIT GRAFTMAG DEL NEURO DISP (NEUROSURGERY SUPPLIES) ×1 IMPLANT
KIT TURNOVER KIT B (KITS) ×2 IMPLANT
NDL HYPO 21X1.5 SAFETY (NEEDLE) IMPLANT
NEEDLE HYPO 21X1.5 SAFETY (NEEDLE) IMPLANT
NEEDLE HYPO 22GX1.5 SAFETY (NEEDLE) ×2 IMPLANT
NS IRRIG 1000ML POUR BTL (IV SOLUTION) ×2 IMPLANT
OIL CARTRIDGE MAESTRO DRILL (MISCELLANEOUS) ×2
PACK LAMINECTOMY NEURO (CUSTOM PROCEDURE TRAY) ×2 IMPLANT
PAD ARMBOARD 7.5X6 YLW CONV (MISCELLANEOUS) ×6 IMPLANT
PATTIES SURGICAL .5 X1 (DISPOSABLE) IMPLANT
PUTTY DBM 5CC CALC GRAN (Putty) ×2 IMPLANT
ROD CREO DLX CVD 6.35X40 (Rod) IMPLANT
ROD CURVED TI 6.35X40 (Rod) ×4 IMPLANT
SCREW PA DLX CREO 7.5X50 (Screw) ×4 IMPLANT
SPACER ALTERA 10X31-15 (Spacer) ×1 IMPLANT
SPIKE FLUID TRANSFER (MISCELLANEOUS) ×1 IMPLANT
SPONGE NEURO XRAY DETECT 1X3 (DISPOSABLE) IMPLANT
SPONGE SURGIFOAM ABS GEL 100 (HEMOSTASIS) IMPLANT
SPONGE T-LAP 4X18 ~~LOC~~+RFID (SPONGE) IMPLANT
STRIP CLOSURE SKIN 1/2X4 (GAUZE/BANDAGES/DRESSINGS) ×2 IMPLANT
SUT VIC AB 1 CT1 18XBRD ANBCTR (SUTURE) ×2 IMPLANT
SUT VIC AB 1 CT1 8-18 (SUTURE) ×4
SUT VIC AB 2-0 CP2 18 (SUTURE) ×4 IMPLANT
SYR 20ML LL LF (SYRINGE) ×1 IMPLANT
TOWEL GREEN STERILE (TOWEL DISPOSABLE) ×2 IMPLANT
TOWEL GREEN STERILE FF (TOWEL DISPOSABLE) ×2 IMPLANT
TRAY FOLEY MTR SLVR 14FR STAT (SET/KITS/TRAYS/PACK) ×1 IMPLANT
TRAY FOLEY MTR SLVR 16FR STAT (SET/KITS/TRAYS/PACK) ×1 IMPLANT
WATER STERILE IRR 1000ML POUR (IV SOLUTION) ×2 IMPLANT

## 2022-07-18 NOTE — Transfer of Care (Signed)
Immediate Anesthesia Transfer of Care Note  Patient: Sara Mcdonald  Procedure(s) Performed: POSTERIOR LUMBAR INTERBODY FUSION, INSTRUMENTED PROSTHESIS, POSTERIOR INSTRUMENTATION, LUMBAR FOUR-FIVE (Spine Lumbar)  Patient Location: PACU  Anesthesia Type:General  Level of Consciousness: drowsy and patient cooperative  Airway & Oxygen Therapy: Patient Spontanous Breathing and Patient connected to nasal cannula oxygen  Post-op Assessment: Report given to RN and Post -op Vital signs reviewed and stable  Post vital signs: Reviewed and stable  Last Vitals:  Vitals Value Taken Time  BP 111/61 07/18/22 1618  Temp    Pulse 111 07/18/22 1621  Resp 16 07/18/22 1621  SpO2 95 % 07/18/22 1621  Vitals shown include unvalidated device data.  Last Pain:  Vitals:   07/18/22 1030  TempSrc:   PainSc: 8       Patients Stated Pain Goal: 2 (59/74/16 3845)  Complications: No notable events documented.

## 2022-07-18 NOTE — H&P (Signed)
Subjective: The patient is a 76 year old white female who is complained of back and leg pain consistent with neurogenic claudication.  She has failed medical management and was worked up with a lumbar MRI and lumbar x-rays which demonstrated an L4-5 spondylolisthesis, facet arthropathy, spinal stenosis, etc.  I discussed the various treatment options with her.  She has decided proceed with surgery.  Past Medical History:  Diagnosis Date   Anxiety    Chronic kidney disease    CKD3   Depression    Diabetes mellitus without complication (HCC)    Dyspnea    with episodes of pleurisy   Elevated lipids    Fibromyalgia    GERD (gastroesophageal reflux disease)    Headache    Hypertension    OA (osteoarthritis)    Pneumonia    has had several times. last time 2019   Sleep apnea     Past Surgical History:  Procedure Laterality Date   APPENDECTOMY     CHOLECYSTECTOMY     COLONOSCOPY     COLONOSCOPY WITH PROPOFOL N/A 07/07/2016   Procedure: COLONOSCOPY WITH PROPOFOL;  Surgeon: Manya Silvas, MD;  Location: [place];  Service: Endoscopy;  Laterality: N/A;   ESOPHAGOGASTRODUODENOSCOPY     EYE SURGERY  2013   bilateral cataract   FLEXIBLE SIGMOIDOSCOPY     nanoflex Bilateral    TOTAL VAGINAL HYSTERECTOMY  1976    Allergies  Allergen Reactions   Elemental Sulfur Rash   Sulfa Antibiotics Rash   Tricor [Fenofibrate] Rash    Social History   Tobacco Use   Smoking status: Never   Smokeless tobacco: Never  Substance Use Topics   Alcohol use: No    Family History  Problem Relation Age of Onset   Breast cancer Paternal Aunt 3   CVA Mother    Hypertension Mother    COPD Father    CAD Father    Diabetes Father    Prior to Admission medications   Medication Sig Start Date End Date Taking? Authorizing Provider  acetaminophen-codeine (TYLENOL #3) 300-30 MG tablet Take 1 tablet by mouth 2 (two) times daily as needed for moderate pain.   Yes [provider]   ALPRAZolam Duanne Moron) 0.5 MG tablet Take 0.5 mg by mouth 2 (two) times daily as needed for anxiety or sleep.  01/11/17  Yes [provider]  cholecalciferol (VITAMIN D3) 25 MCG (1000 UNIT) tablet Take 1,000 Units by mouth daily.   Yes [provider]  metFORMIN (GLUCOPHAGE) 500 MG tablet Take 500 mg by mouth daily.    Yes [provider]  Multiple Vitamin (MULTIVITAMIN WITH MINERALS) TABS tablet Take 1 tablet by mouth daily.   Yes [provider]  nystatin-triamcinolone (MYCOLOG II) cream Apply 1 application  topically daily as needed (rash under breast). 07/26/18  Yes [provider]  olmesartan (BENICAR) 20 MG tablet Take 20 mg by mouth daily. 07/26/20  Yes [provider]  pantoprazole (PROTONIX) 40 MG tablet Take 40 mg by mouth 2 (two) times daily.    Yes [provider]  polyethylene glycol powder (GLYCOLAX/MIRALAX) powder Take 17 g by mouth daily as needed for moderate constipation. 02/15/17  Yes [provider]  pravastatin (PRAVACHOL) 80 MG tablet Take 80 mg by mouth daily.   Yes [provider]  sucralfate (CARAFATE) 1 g tablet Take 1 g by mouth 2 (two) times daily. 05/27/22  Yes [provider]  topiramate (TOPAMAX) 100 MG tablet Take 100 mg by mouth  at bedtime. 04/11/22  Yes [provider]  traZODone (DESYREL) 100 MG tablet Take 100 mg by mouth at bedtime.   Yes [provider]  triamterene-hydrochlorothiazide (MAXZIDE-25) 37.5-25 MG tablet Take 1 tablet by mouth daily. 07/17/21  Yes [provider]  venlafaxine XR (EFFEXOR-XR) 75 MG 24 hr capsule Take 75 mg by mouth daily. 08/24/20  Yes [provider]  promethazine (PHENERGAN) 25 MG suppository Place 25 mg rectally every 6 (six) hours as needed for nausea or vomiting.    [provider]  promethazine (PHENERGAN) 25 MG tablet Take 25 mg by mouth every 6 (six) hours as needed for nausea or vomiting.    [provider]     Review of Systems  Positive ROS: As above  All other systems have been reviewed and were otherwise negative with the exception of those mentioned in the HPI and as above.  Objective: Vital signs in last 24 hours: Temp:  [98.9 F (37.2 C)] 98.9 F (37.2 C) (07/31 0949) Pulse Rate:  [106] 106 (07/31 0949) Resp:  [18] 18 (07/31 0949) BP: (134)/(76) 134/76 (07/31 0949) SpO2:  [93 %] 93 % (07/31 0949) Weight:  [82.6 kg] 82.6 kg (07/31 0949) Estimated body mass index is 36.76 kg/m as calculated from the following:   Height as of this encounter: '4\' 11"'$  (1.499 m).   Weight as of this encounter: 82.6 kg.   General Appearance: Alert Head: Normocephalic, without obvious abnormality, atraumatic Eyes: PERRL, conjunctiva/corneas clear, EOM's intact,    Ears: Normal  Throat: Normal  Neck: Supple, Back: unremarkable Lungs: Clear to auscultation bilaterally, respirations unlabored Heart: Regular rate and rhythm, no murmur, rub or gallop Abdomen: Soft, non-tender Extremities: Extremities normal, atraumatic, no cyanosis or edema Skin: unremarkable  NEUROLOGIC:   Mental status: alert and oriented,Motor Exam - grossly normal Sensory Exam - grossly normal Reflexes:  Coordination - grossly normal Gait - grossly normal Balance - grossly normal Cranial Nerves: I: smell Not tested  II: visual acuity  OS: Normal  OD: Normal   II: visual fields Full to confrontation  II: pupils Equal, round, reactive to light  III,VII: ptosis None  III,IV,VI: extraocular muscles  Full ROM  V: mastication Normal  V: facial light touch sensation  Normal  V,VII: corneal reflex  Present  VII: facial muscle function - upper  Normal  VII: facial muscle function - lower Normal  VIII: hearing Not tested  IX: soft palate elevation  Normal  IX,X: gag reflex Present  XI: trapezius strength  5/5  XI: sternocleidomastoid strength 5/5  XI: neck flexion strength  5/5  XII: tongue strength   Normal    Data Review Lab Results  Component Value Date   WBC 9.6 07/11/2022   HGB 13.1 07/11/2022   HCT 39.6 07/11/2022   MCV 90.4 07/11/2022   PLT 317 07/11/2022   Lab Results  Component Value Date   NA 136 07/11/2022   K 3.8 07/11/2022   CL 100 07/11/2022   CO2 24 07/11/2022   BUN 22 07/11/2022   CREATININE 1.51 (H) 07/11/2022   GLUCOSE 117 (H) 07/11/2022   No results found for: "INR", "PROTIME"  Assessment/Plan: Lumbar spondylolisthesis, lumbar facet arthropathy, lumbago, lumbar radiculopathy, lumbar spinal stenosis, neurogenic claudication: I have discussed the situation with the patient.  I have reviewed her imaging studies with her and pointed out the abnormalities.  We have discussed the various treatment options including surgery.  I have described the surgical treatment option of an L4-5  decompression, instrumentation and fusion.  I have shown her surgical models.  I have given her a surgical pamphlet.  We have discussed the risk, benefits, alternatives, expected postop course, and likelihood of achieving our goals with surgery.  I have answered all her questions.  She has decided proceed with surgery.   Sara Mcdonald 07/18/2022 11:47 AM

## 2022-07-18 NOTE — Progress Notes (Signed)
Orthopedic Tech Progress Note Patient Details:  Sara Mcdonald 1946/04/03 [id number]  Ortho Devices Type of Ortho Device: Lumbar corsett Ortho Device/Splint Location: BACK Ortho Device/Splint Interventions: Ordered   Post Interventions Patient Tolerated: Well Instructions Provided: [facility] of [place] 07/18/2022, 5:57 PM

## 2022-07-18 NOTE — Anesthesia Procedure Notes (Signed)
Procedure Name: Intubation Date/Time: 07/18/2022 12:39 PM  Performed by: Moshe Salisbury, CRNAPre-anesthesia Checklist: Patient identified, Emergency Drugs available, Suction available and Patient being monitored Patient Re-evaluated:Patient Re-evaluated prior to induction Oxygen Delivery Method: Circle System Utilized Preoxygenation: Pre-oxygenation with 100% oxygen Induction Type: IV induction Ventilation: Mask ventilation without difficulty and Two handed mask ventilation required Laryngoscope Size: Glidescope and 3 Grade View: Grade I Tube type: Oral Tube size: 7.0 mm Number of attempts: 1 Airway Equipment and Method: Stylet, Oral airway and Video-laryngoscopy Placement Confirmation: ETT inserted through vocal cords under direct vision, positive ETCO2 and breath sounds checked- equal and bilateral Secured at: 21 cm Tube secured with: Tape Dental Injury: Teeth and Oropharynx as per pre-operative assessment

## 2022-07-18 NOTE — Op Note (Signed)
Brief history: The patient is a 76 year old white female who is complained of back and buttock pain consistent with neurogenic claudication.  She failed medical management and was worked up with a lumbar MRI lumbar x-rays which demonstrated an L4-5 spondylolisthesis, facet arthropathy, spinal stenosis, etc.  I discussed the various treatment options with her.  She has decided proceed with surgery.  Preoperative diagnosis: L4-5 spondylolisthesis, facet arthropathy , spinal stenosis compressing both the L4 and the L5 nerve roots; lumbago; lumbar radiculopathy; neurogenic claudication  Postoperative diagnosis: The same  Procedure: Bilateral L4-5 laminotomy/foraminotomies/medial facetectomy to decompress the bilateral L4 and L5 nerve roots(the work required to do this was in addition to the work required to do the posterior lumbar interbody fusion because of the patient's spinal stenosis, facet arthropathy. Etc. requiring a wide decompression of the nerve roots.);  Right L4-5 transforaminal lumbar interbody fusion with local morselized autograft bone and Zimmer DBM; insertion of interbody prosthesis at L4-5 (globus peek expandable interbody prosthesis); posterior nonsegmental instrumentation from L4 to L5 with globus titanium pedicle screws and rods; posterior lateral arthrodesis at L4-5 with local morselized autograft bone and Zimmer DBM.  Surgeon: Dr. Earle Gell  Asst.: Arnetha Massy, NP  Anesthesia: Gen. endotracheal  Estimated blood loss: 200 cc  Drains: None  Complications: None  Description of procedure: The patient was brought to the operating room by the anesthesia team. General endotracheal anesthesia was induced. The patient was turned to the prone position on the Wilson frame. The patient's lumbosacral region was then prepared with Betadine scrub and Betadine solution. Sterile drapes were applied.  I then injected the area to be incised with Marcaine with epinephrine solution. I then  used the scalpel to make a linear midline incision over the L4-5 interspace. I then used electrocautery to perform a bilateral subperiosteal dissection exposing the spinous process and lamina of L4 and L5. We then obtained intraoperative radiograph to confirm our location. We then inserted the Verstrac retractor to provide exposure.  I began the decompression by using the high speed drill to perform laminotomies at L4-5 bilaterally. We then used the Kerrison punches to widen the laminotomy and removed the ligamentum flavum at L4-5 bilaterally. We used the Kerrison punches to remove the medial facets at L4-5 bilaterally. We performed wide foraminotomies about the bilateral L4 and L5 nerve roots completing the decompression.  We now turned our attention to the posterior lumbar interbody fusion. I used a scalpel to incise the intervertebral disc at L4-5 bilaterally. I then performed a partial intervertebral discectomy at L4-5 bilaterally using the pituitary forceps. We prepared the vertebral endplates at X5-1 bilaterally for the fusion by removing the soft tissues with the curettes. We then used the trial spacers to pick the appropriate sized interbody prosthesis. We prefilled his prosthesis with a combination of local morselized autograft bone that we obtained during the decompression as well as Zimmer DBM. We inserted the prefilled prosthesis into the interspace at L4-5 from the right, we then turned and expanded the prosthesis. There was a good snug fit of the prosthesis in the interspace. We then filled and the remainder of the intervertebral disc space with local morselized autograft bone and Zimmer DBM. This completed the posterior lumbar interbody arthrodesis.  During the decompression and insertion of the prosthesis the assistant protected the thecal sac and nerve roots with the D'Errico retractor.  We now turned attention to the instrumentation. Under fluoroscopic guidance we cannulated the bilateral L4  and L5 pedicles with the bone probe. We  then removed the bone probe. We then tapped the pedicle with a 6.5 millimeter tap. We then removed the tap. We probed inside the tapped pedicle with a ball probe to rule out cortical breaches. We then inserted a 7.5 x 50 millimeter pedicle screw into the L4 and L5 pedicles bilaterally under fluoroscopic guidance. We then palpated along the medial aspect of the pedicles to rule out cortical breaches. There were none. The nerve roots were not injured. We then connected the unilateral pedicle screws with a lordotic rod. We compressed the construct and secured the rod in place with the caps. We then tightened the caps appropriately. This completed the instrumentation from L4-5.  We now turned our attention to the posterior lateral arthrodesis at L4-5. We used the high-speed drill to decorticate the remainder of the facets, pars, transverse process at L4-5. We then applied a combination of local morselized autograft bone and Zimmer DBM over these decorticated posterior lateral structures. This completed the posterior lateral arthrodesis.  We then obtained hemostasis using bipolar electrocautery. We irrigated the wound out with bacitracin solution. We inspected the thecal sac and nerve roots and noted they were well decompressed. We then removed the retractor.  We injected Exparel . We reapproximated patient's thoracolumbar fascia with interrupted #1 Vicryl suture. We reapproximated patient's subcutaneous tissue with interrupted 2-0 Vicryl suture. The reapproximated patient's skin with Steri-Strips and benzoin. The wound was then coated with bacitracin ointment. A sterile dressing was applied. The drapes were removed. The patient was subsequently returned to the supine position where they were extubated by the anesthesia team. He was then transported to the post anesthesia care unit in stable condition. All sponge instrument and needle counts were reportedly correct at the end  of this case.

## 2022-07-19 DIAGNOSIS — M4316 Spondylolisthesis, lumbar region: Secondary | ICD-10-CM | POA: Diagnosis not present

## 2022-07-19 LAB — CBC
HCT: 33.9 % — ABNORMAL LOW (ref 36.0–46.0)
Hemoglobin: 10.8 g/dL — ABNORMAL LOW (ref 12.0–15.0)
MCH: 29.7 pg (ref 26.0–34.0)
MCHC: 31.9 g/dL (ref 30.0–36.0)
MCV: 93.1 fL (ref 80.0–100.0)
Platelets: 202 10*3/uL (ref 150–400)
RBC: 3.64 MIL/uL — ABNORMAL LOW (ref 3.87–5.11)
RDW: 13.2 % (ref 11.5–15.5)
WBC: 14.6 10*3/uL — ABNORMAL HIGH (ref 4.0–10.5)
nRBC: 0 % (ref 0.0–0.2)

## 2022-07-19 LAB — BASIC METABOLIC PANEL
Anion gap: 9 (ref 5–15)
BUN: 21 mg/dL (ref 8–23)
CO2: 24 mmol/L (ref 22–32)
Calcium: 8.2 mg/dL — ABNORMAL LOW (ref 8.9–10.3)
Chloride: 100 mmol/L (ref 98–111)
Creatinine, Ser: 1.72 mg/dL — ABNORMAL HIGH (ref 0.44–1.00)
GFR, Estimated: 30 mL/min — ABNORMAL LOW (ref >60)
Glucose, Bld: 129 mg/dL — ABNORMAL HIGH (ref 70–99)
Potassium: 3.5 mmol/L (ref 3.5–5.1)
Sodium: 133 mmol/L — ABNORMAL LOW (ref 135–145)

## 2022-07-19 LAB — GLUCOSE, CAPILLARY
Glucose-Capillary: 134 mg/dL — ABNORMAL HIGH (ref 70–99)
Glucose-Capillary: 120 mg/dL — ABNORMAL HIGH (ref 70–99)
Glucose-Capillary: 140 mg/dL — ABNORMAL HIGH (ref 70–99)
Glucose-Capillary: 171 mg/dL — ABNORMAL HIGH (ref 70–99)

## 2022-07-19 MED FILL — Thrombin For Soln 5000 Unit: CUTANEOUS | Qty: 5000 | Status: AC

## 2022-07-19 NOTE — Progress Notes (Signed)
Subjective:    I saw the patient at approximately 7:30 a.m. this morning.  I forgot  to write a note.   The patient was alert and pleasant.  She looks well.  She was interested in going to rehab.  Her daughter was at the bedside. Objective: Vital signs in last 24 hours: Temp:  [97.8 F (36.6 C)-98.9 F (37.2 C)] 98.5 F (36.9 C) (08/01 1619) Pulse Rate:  [97-107] 100 (08/01 1619) Resp:  [10-20] 17 (08/01 1619) BP: (90-142)/(46-75) 90/46 (08/01 1619) SpO2:  [91 %-98 %] 94 % (08/01 1619) Estimated body mass index is 36.76 kg/m as calculated from the following:   Height as of this encounter: '4\' 11"'$  (1.499 m).   Weight as of this encounter: 82.6 kg.   Intake/Output from previous day: 07/31 0701 - 08/01 0700 In: 1000 [I.V.:800; IV Piggyback:200] Out: 225 [Urine:150; Blood:75] Intake/Output this shift: No intake/output data recorded.  Physical exam   The patient was alert and pleasant.  Her dressing was clean and dry.  Her lower extremity strength is grossly normal.  Lab Results: Recent Labs    07/19/22 0741  WBC 14.6*  HGB 10.8*  HCT 33.9*  PLT 202   BMET Recent Labs    07/19/22 0741  NA 133*  K 3.5  CL 100  CO2 24  GLUCOSE 129*  BUN 21  CREATININE 1.72*  CALCIUM 8.2*    Studies/Results: DG Lumbar Spine 2-3 Views  Result Date: 07/18/2022 CLINICAL DATA:  L4-L5 PLIF EXAM: LUMBAR SPINE - 2-3 VIEW COMPARISON:  Radiograph 07/18/2022 FINDINGS: Intraoperative images during L4-L5 fusion. No immediate complication. IMPRESSION: Intraoperative images during L4-L5 fusion. Electronically Signed   By: Maurine Simmering M.D.   On: 07/18/2022 15:49   DG C-Arm 1-60 Min-No Report  Result Date: 07/18/2022 Fluoroscopy was utilized by the requesting physician.  No radiographic interpretation.   DG Lumbar Spine 1 View  Result Date: 07/18/2022 CLINICAL DATA:  Intraoperative imaging for localization EXAM: LUMBAR SPINE - 1 VIEW COMPARISON:  MRI 05/30/2022 FINDINGS: Single lateral view  labeled 1320 hours demonstrates soft tissue expanders and a localizer projecting posterior to the L4-5 interspace. Loss of intervertebral disc height at the lumbosacral junction. Facet arthropathy at L5-S1 and L4-5. IMPRESSION: Intraoperative localization of L4-5. Electronically Signed   By: Abigail Miyamoto M.D.   On: 07/18/2022 14:31    Assessment/Plan:   Postop day 1.: I will ask we have the see the patient as requested.  She will continue to mobilize with PT and OT.  I have answered all their questions.  Renal insufficiency:  Her creatinine is slightly worse than preop.  I will plan to repeat her BMP tomorrow.  LOS: 0 days     [name] 07/19/2022, 5:14 PM     Patient ID: Sara Mcdonald, female   DOB: 12-Jun-1946, 76 y.o.   MRN: [id number]

## 2022-07-19 NOTE — Anesthesia Postprocedure Evaluation (Signed)
Anesthesia Post Note  Patient: Sara Mcdonald  Procedure(s) Performed: POSTERIOR LUMBAR INTERBODY FUSION, INSTRUMENTED PROSTHESIS, POSTERIOR INSTRUMENTATION, LUMBAR FOUR-FIVE (Spine Lumbar)     Patient location during evaluation: PACU Anesthesia Type: General Level of consciousness: awake and alert Pain management: pain level controlled Vital Signs Assessment: post-procedure vital signs reviewed and stable Respiratory status: spontaneous breathing, nonlabored ventilation, respiratory function stable and patient connected to nasal cannula oxygen Cardiovascular status: blood pressure returned to baseline and stable Postop Assessment: no apparent nausea or vomiting Anesthetic complications: no   No notable events documented.  Last Vitals:  Vitals:   07/19/22 1348 07/19/22 1619  BP: (!) 99/47 (!) 90/46  Pulse: (!) 106 100  Resp: 16 17  Temp: 36.9 C 36.9 C  SpO2: 93% 94%    Last Pain:  Vitals:   07/19/22 1427  TempSrc:   PainSc: 3                  [name] P Kess Mcilwain

## 2022-07-19 NOTE — Evaluation (Signed)
Occupational Therapy Evaluation Patient Details Name: Sara Mcdonald MRN: [id number] DOB: February 11, 1946 Today's Date: 07/19/2022   History of Present Illness 76 year old white female who is complained of back and buttock pain consistent with neurogenic claudication. She is s/p Bilateral L4-5 laminotomy/foraminotomies/medial facetectomy to decompress the bilateral L4 and L5 nerve roots 7/31. PMHx: anxiety, CKD, depression, dyspnea, fibromyalgia, HTN, OA   Clinical Impression   Sara Mcdonald was evaluated s/p the above back surgery, she is generally mod I with BADLs at baseline and family assists with heavy IADLs. She lives in a townhome with 1 STE with her daughter who works full time. Upon evaluation pt required min A for LB ADLs to maintain back precautions and was generally supervision for functional mobility with RW. Extensively reviewed AE, DME, and compensatory techniques to increase safety and maintain back precautions during ADLs. Pt does have a 15lb dog at home that she is worried about, as she normally has to pick it up to put on the couch/bed; daughter in room stating they will build a temporary ramp for the dog. Daughter also inquiring about CIR - educated pt and daughter about CIR requirements for admission, including medical necessity and appropriate PT/OT levels, they verbalized understanding. Pt will benefit from OT acutely should LOS continue. Recommend pt d/c home with Friend and increased family support.      Recommendations for follow up therapy are one component of a multi-disciplinary discharge planning process, led by the attending physician.  Recommendations may be updated based on patient status, additional functional criteria and insurance authorization.   Follow Up Recommendations  Home health OT    Assistance Recommended at Discharge Intermittent Supervision/Assistance  Patient can return home with the following A little help with walking and/or transfers;A little help with  bathing/dressing/bathroom;Assistance with cooking/housework;Assist for transportation;Help with stairs or ramp for entrance    Functional Status Assessment  Patient has had a recent decline in their functional status and demonstrates the ability to make significant improvements in function in a reasonable and predictable amount of time.  Equipment Recommendations  None recommended by OT    Recommendations for Other Services       Precautions / Restrictions Precautions Precautions: Fall;Back Precaution Booklet Issued: Yes (comment) Required Braces or Orthoses: Spinal Brace Spinal Brace: Lumbar corset;Applied in sitting position Restrictions Weight Bearing Restrictions: No      Mobility Bed Mobility Overal bed mobility: Needs Assistance             General bed mobility comments: pt sitting EOB upon arrival - verbally reviewed log roll    Transfers Overall transfer level: Needs assistance Equipment used: Rolling walker (2 wheels) Transfers: Sit to/from Stand Sit to Stand: Supervision                  Balance Overall balance assessment: Mild deficits observed, not formally tested                                         ADL either performed or assessed with clinical judgement   ADL Overall ADL's : Needs assistance/impaired Eating/Feeding: Independent;Sitting   Grooming: Supervision/safety;Standing Grooming Details (indicate cue type and reason): cues for back precautions and compensatory techniques Upper Body Bathing: Supervision/ safety;Sitting   Lower Body Bathing: Minimal assistance;Sit to/from stand   Upper Body Dressing : Supervision/safety;Sitting   Lower Body Dressing: Minimal assistance;Sit to/from stand Lower Body Dressing Details (indicate cue  type and reason): educated on AE to increase independence Toilet Transfer: Supervision/safety;Rolling walker (2 wheels)   Toileting- Clothing Manipulation and Hygiene:  Supervision/safety;Sitting/lateral lean Toileting - Clothing Manipulation Details (indicate cue type and reason): cues for compensatory tehcnique s     Functional mobility during ADLs: Supervision/safety;Rolling walker (2 wheels);Cueing for safety General ADL Comments: extensively reviewed AE and compensatory techniques to maintain back precautions     Vision Baseline Vision/History: 0 No visual deficits Vision Assessment?: No apparent visual deficits     Perception     Praxis      Pertinent Vitals/Pain Pain Assessment Pain Assessment: Faces Faces Pain Scale: Hurts little more Pain Location: back Pain Descriptors / Indicators: Discomfort Pain Intervention(s): Limited activity within patient's tolerance, Monitored during session     Hand Dominance Right   Extremity/Trunk Assessment Upper Extremity Assessment Upper Extremity Assessment: Overall WFL for tasks assessed;Generalized weakness   Lower Extremity Assessment Lower Extremity Assessment: Defer to PT evaluation   Cervical / Trunk Assessment Cervical / Trunk Assessment: Normal   Communication Communication Communication: No difficulties   Cognition Arousal/Alertness: Awake/alert Behavior During Therapy: WFL for tasks assessed/performed Overall Cognitive Status: Within Functional Limits for tasks assessed                                       General Comments  VSS on RA - daughter present and asking about IPR    Exercises     Shoulder Instructions      Home Living Family/patient expects to be discharged to:: Private residence Living Arrangements: Children Available Help at Discharge: Family;Available PRN/intermittently Type of Home: House Home Access: Stairs to enter [employer] of Steps: 1   Home Layout: One level     Bathroom Shower/Tub: Occupational psychologist: Standard     Home Equipment: Conservation officer, nature (2 wheels);Cane - single point;BSC/3in1;Grab bars -  toilet;Grab bars - tub/shower   Additional Comments: daughter works a lot; pt has 2 other childrena dn grandchildren who can assist as needed      Prior Functioning/Environment Prior Level of Function : Independent/Modified Independent             Mobility Comments: no AD ADLs Comments: indep, pt's family complete heavy IADLs.        OT Problem List: Decreased strength;Decreased activity tolerance;Decreased range of motion;Impaired balance (sitting and/or standing);Decreased safety awareness;Decreased knowledge of precautions;Pain      OT Treatment/Interventions: Self-care/ADL training;Therapeutic exercise;DME and/or AE instruction;Therapeutic activities;Patient/family education;Balance training    OT Goals(Current goals can be found in the care plan section) Acute Rehab OT Goals Patient Stated Goal: to go to IPR OT Goal Formulation: With patient Time For Goal Achievement: 08/02/22 Potential to Achieve Goals: Good ADL Goals Pt Will Perform Grooming: with modified independence;standing Pt Will Perform Lower Body Dressing: with modified independence;sit to/from stand Pt Will Transfer to Toilet: with modified independence;ambulating  OT Frequency: Min 2X/week    Co-evaluation              AM-PAC OT "6 Clicks" Daily Activity     Outcome Measure Help from another person eating meals?: None Help from another person taking care of personal grooming?: A Little Help from another person toileting, which includes using toliet, bedpan, or urinal?: A Little Help from another person bathing (including washing, rinsing, drying)?: A Little Help from another person to put on and taking off regular upper body  clothing?: None Help from another person to put on and taking off regular lower body clothing?: A Little 6 Click Score: 20   End of Session Equipment Utilized During Treatment: Rolling walker (2 wheels) Nurse Communication: Mobility status  Activity Tolerance: Patient  tolerated treatment well Patient left: in bed;with call bell/phone within reach;with family/visitor present  OT Visit Diagnosis: Unsteadiness on feet (R26.81);Other abnormalities of gait and mobility (R26.89);Muscle weakness (generalized) (M62.81)                Time: 5834-6219 OT Time Calculation (min): 34 min Charges:  OT General Charges $OT Visit: 1 Visit OT Evaluation $OT Eval Moderate Complexity: 1 Mod    Griffon Herberg A Samaira Holzworth 07/19/2022, 9:26 AM

## 2022-07-19 NOTE — Evaluation (Signed)
Physical Therapy Evaluation  Patient Details Name: Sara Mcdonald MRN: [id number] DOB: [birth year] Today's Date: 07/19/2022  History of Present Illness  76 year old white female who is complained of back and buttock pain consistent with neurogenic claudication. She is s/p Bilateral L4-5 laminotomy/foraminotomies/medial facetectomy to decompress the bilateral L4 and L5 nerve roots 7/31. PMHx: anxiety, CKD, depression, dyspnea, fibromyalgia, HTN, OA   Clinical Impression  Pt admitted with above diagnosis. At the time of PT eval, pt was able to demonstrate transfers and ambulation with gross min guard assist to supervision for safety and RW for support. Pt was educated on precautions, brace application/wearing schedule, appropriate activity progression, and car transfer. Extensive discussion with pt and family regarding options for PT follow up. Family pushing for AIR, however pt not an appropriate AIR candidate at this time. Feel pt will be able to manage at home with family support, and pt reports her only concern is being able to get in and out of the bed on her own. Feel it would be beneficial for therapies to work with pt again tomorrow to maximize functional independence and decrease risk for falls prior to return home with family. Pt currently with functional limitations due to the deficits listed below (see PT Problem List). Acutely, pt will benefit from skilled PT to increase their independence and safety with mobility to allow discharge to the venue listed below.         Recommendations for follow up therapy are one component of a multi-disciplinary discharge planning process, led by the attending physician.  Recommendations may be updated based on patient status, additional functional criteria and insurance authorization.  Follow Up Recommendations No PT follow up      Assistance Recommended at Discharge Intermittent Supervision/Assistance  Patient can return home with the following  A  little help with walking and/or transfers;A little help with bathing/dressing/bathroom;Assistance with cooking/housework;Assist for transportation;Help with stairs or ramp for entrance    Equipment Recommendations Rolling walker (2 wheels) (Youth)  Recommendations for Other Services       Functional Status Assessment Patient has had a recent decline in their functional status and demonstrates the ability to make significant improvements in function in a reasonable and predictable amount of time.     Precautions / Restrictions Precautions Precautions: Fall;Back Precaution Booklet Issued: Yes (comment) Required Braces or Orthoses: Spinal Brace Spinal Brace: Lumbar corset;Applied in sitting position Restrictions Weight Bearing Restrictions: No      Mobility  Bed Mobility Overal bed mobility: Needs Assistance Bed Mobility: Rolling, Sidelying to Sit, Sit to Sidelying Rolling: Supervision Sidelying to sit: Supervision     Sit to sidelying: Min guard General bed mobility comments: Bed height elevated, HOB flat and rails lowered to simulate home environment. Pt progressed to requiring min guard assist for LE elevation by end of session. Step-by-step cues for optimal log roll technique.    Transfers Overall transfer level: Needs assistance Equipment used: Rolling walker (2 wheels) Transfers: Sit to/from Stand Sit to Stand: Supervision           General transfer comment: VC's for hand placement on seated surface for safety. Hands on guarding as pt powered up to full stand. Increased time to gain/maintain standing balance.    Ambulation/Gait Ambulation/Gait assistance: Min guard, Supervision Gait Distance (Feet): 200 Feet Assistive device: Rolling walker (2 wheels) Gait Pattern/deviations: Step-through pattern, Decreased stride length, Trunk flexed Gait velocity: Decreased Gait velocity interpretation: <1.31 ft/sec, indicative of household ambulator   General Gait Details:  Min guard  progressing to supervision for safety by end of session. No overt LOB noted. Pt moving slow and guarded.  Stairs            Wheelchair Mobility    Modified Rankin (Stroke Patients Only)       Balance Overall balance assessment: Mild deficits observed, not formally tested                                           Pertinent Vitals/Pain Pain Assessment Pain Assessment: Faces Faces Pain Scale: Hurts little more Pain Location: back Pain Descriptors / Indicators: Discomfort Pain Intervention(s): Limited activity within patient's tolerance, Monitored during session, Repositioned    Home Living Family/patient expects to be discharged to:: Private residence Living Arrangements: Children Available Help at Discharge: Family;Available PRN/intermittently Type of Home: House Home Access: Stairs to enter   Entrance Stairs-Number of Steps: 1   Home Layout: One level Home Equipment: Conservation officer, nature (2 wheels);Cane - single point;BSC/3in1;Grab bars - toilet;Grab bars - tub/shower Additional Comments: daughter works a lot; pt has 2 other children and grandchildren who can assist as needed    Prior Function Prior Level of Function : Independent/Modified Independent             Mobility Comments: no AD ADLs Comments: indep, pt's family complete heavy IADLs.     Hand Dominance   Dominant Hand: Right    Extremity/Trunk Assessment   Upper Extremity Assessment Upper Extremity Assessment: Defer to OT evaluation    Lower Extremity Assessment Lower Extremity Assessment: Generalized weakness (Consistent with pre-op diagnosis)    Cervical / Trunk Assessment Cervical / Trunk Assessment: Back Surgery  Communication   Communication: No difficulties  Cognition Arousal/Alertness: Awake/alert Behavior During Therapy: WFL for tasks assessed/performed Overall Cognitive Status: Within Functional Limits for tasks assessed                                           General Comments      Exercises     Assessment/Plan    PT Assessment Patient needs continued PT services  PT Problem List Decreased strength;Decreased activity tolerance;Decreased balance;Decreased mobility;Decreased knowledge of use of DME;Decreased safety awareness;Decreased knowledge of precautions;Pain       PT Treatment Interventions DME instruction;Gait training;Stair training;Functional mobility training;Therapeutic activities;Therapeutic exercise;Neuromuscular re-education;Patient/family education    PT Goals (Current goals can be found in the Care Plan section)  Acute Rehab PT Goals Patient Stated Goal: Be able to take care of herself at home. PT Goal Formulation: With patient Time For Goal Achievement: 07/26/22 Potential to Achieve Goals: Good    Frequency Min 5X/week     Co-evaluation               AM-PAC PT "6 Clicks" Mobility  Outcome Measure Help needed turning from your back to your side while in a flat bed without using bedrails?: A Little Help needed moving from lying on your back to sitting on the side of a flat bed without using bedrails?: A Little Help needed moving to and from a bed to a chair (including a wheelchair)?: A Little Help needed standing up from a chair using your arms (e.g., wheelchair or bedside chair)?: A Little Help needed to walk in hospital room?: A Little Help needed climbing 3-5 steps with  a railing? : A Little 6 Click Score: 18    End of Session Equipment Utilized During Treatment: Gait belt;Back brace Activity Tolerance: Patient tolerated treatment well Patient left: in bed;with call bell/phone within reach;with family/visitor present Nurse Communication: Mobility status PT Visit Diagnosis: Unsteadiness on feet (R26.81);Pain Pain - part of body:  (back)    Time: 5797-2820 PT Time Calculation (min) (ACUTE ONLY): 40 min   Charges:   PT Evaluation $PT Eval Low Complexity: 1 Low PT  Treatments $Gait Training: 23-37 mins        [name], PT, DPT Acute Rehabilitation Services Secure Chat Preferred Office: [phone]   [name] 07/19/2022, 1:29 PM

## 2022-07-20 DIAGNOSIS — M4316 Spondylolisthesis, lumbar region: Secondary | ICD-10-CM | POA: Diagnosis not present

## 2022-07-20 LAB — BASIC METABOLIC PANEL
Anion gap: 8 (ref 5–15)
BUN: 30 mg/dL — ABNORMAL HIGH (ref 8–23)
CO2: 22 mmol/L (ref 22–32)
Calcium: 7.8 mg/dL — ABNORMAL LOW (ref 8.9–10.3)
Chloride: 101 mmol/L (ref 98–111)
Creatinine, Ser: 2.03 mg/dL — ABNORMAL HIGH (ref 0.44–1.00)
GFR, Estimated: 25 mL/min — ABNORMAL LOW (ref >60)
Glucose, Bld: 150 mg/dL — ABNORMAL HIGH (ref 70–99)
Potassium: 3.2 mmol/L — ABNORMAL LOW (ref 3.5–5.1)
Sodium: 131 mmol/L — ABNORMAL LOW (ref 135–145)

## 2022-07-20 LAB — GLUCOSE, CAPILLARY: Glucose-Capillary: 155 mg/dL — ABNORMAL HIGH (ref 70–99)

## 2022-07-20 MED ORDER — CYCLOBENZAPRINE HCL 5 MG PO TABS
5.0000 mg | ORAL_TABLET | Freq: Three times a day (TID) | ORAL | 0 refills | Status: DC | PRN
Start: 2022-07-20 — End: 2023-10-09

## 2022-07-20 MED ORDER — OXYCODONE-ACETAMINOPHEN 5-325 MG PO TABS
1.0000 | ORAL_TABLET | ORAL | 0 refills | Status: DC | PRN
Start: 2022-07-20 — End: 2022-08-04

## 2022-07-20 MED ORDER — DOCUSATE SODIUM 100 MG PO CAPS: 100.0000 mg | ORAL_CAPSULE | Freq: Two times a day (BID) | ORAL | 0 refills | Status: DC

## 2022-07-20 MED ORDER — OXYCODONE-ACETAMINOPHEN 5-325 MG PO TABS: 1.0000 | ORAL_TABLET | ORAL | Status: DC | PRN

## 2022-07-20 MED ORDER — CYCLOBENZAPRINE HCL 5 MG PO TABS: 5.0000 mg | ORAL_TABLET | Freq: Three times a day (TID) | ORAL | Status: DC | PRN

## 2022-07-20 NOTE — Progress Notes (Signed)
Occupational Therapy Treatment Patient Details Name: Sara Mcdonald MRN: [id number] DOB: [birth year] Today's Date: 07/20/2022   History of present illness 76 year old white female who is complained of back and buttock pain consistent with neurogenic claudication. She is s/p Bilateral L4-5 laminotomy/foraminotomies/medial facetectomy to decompress the bilateral L4 and L5 nerve roots 7/31. PMHx: anxiety, CKD, depression, dyspnea, fibromyalgia, HTN, OA   OT comments  Xena is progressing but reports increased pain this morning. Overall she required minA for LB dressing with AE and cues for compensatory techniques and to maintain back precautions. Pt recalled back precautions and brace wear schedule well, however needed cues to maintain throughout. Min G for functional ambulation and increased time due to pain. POC remains appropriate.   Recommendations for follow up therapy are one component of a multi-disciplinary discharge planning process, led by the attending physician.  Recommendations may be updated based on patient status, additional functional criteria and insurance authorization.    Follow Up Recommendations  Home health OT    Assistance Recommended at Discharge Intermittent Supervision/Assistance  Patient can return home with the following  A little help with walking and/or transfers;A little help with bathing/dressing/bathroom;Assistance with cooking/housework;Assist for transportation;Help with stairs or ramp for entrance   Equipment Recommendations  None recommended by OT -  Youth RW      Precautions / Restrictions Precautions Precautions: Fall;Back Precaution Booklet Issued: Yes (comment) Precaution Comments: able to recall 3/3 back precautions Required Braces or Orthoses: Spinal Brace Spinal Brace: Lumbar corset;Applied in sitting position Restrictions Weight Bearing Restrictions: No       Mobility Bed Mobility Overal bed mobility: Needs Assistance              General bed mobility comments: pt sitting EOB with NT upon arrival    Transfers Overall transfer level: Needs assistance Equipment used: Rolling walker (2 wheels) Transfers: Sit to/from Stand Sit to Stand: Supervision           General transfer comment: cues for hand placement, pt attempting to pull from walker     Balance Overall balance assessment: Mild deficits observed, not formally tested               ADL either performed or assessed with clinical judgement   ADL Overall ADL's : Needs assistance/impaired     Grooming: Supervision/safety;Standing Grooming Details (indicate cue type and reason): with cues for precautions             Lower Body Dressing: Minimal assistance;Sit to/from stand Lower Body Dressing Details (indicate cue type and reason): min A for back precautions, used AE             Functional mobility during ADLs: Supervision/safety;Rolling walker (2 wheels);Cueing for safety General ADL Comments: limietd by expected back pain - used AE for ADLs, continues to require cues for back precautions    Extremity/Trunk Assessment Upper Extremity Assessment Upper Extremity Assessment: Overall WFL for tasks assessed   Lower Extremity Assessment Lower Extremity Assessment: Defer to PT evaluation        Vision   Vision Assessment?: No apparent visual deficits          Cognition Arousal/Alertness: Awake/alert Behavior During Therapy: WFL for tasks assessed/performed Overall Cognitive Status: Within Functional Limits for tasks assessed                        General Comments VSS on RA    Pertinent Vitals/ Pain       Pain Assessment  Pain Assessment: Faces Faces Pain Scale: Hurts even more Pain Location: back Pain Descriptors / Indicators: Discomfort Pain Intervention(s): Limited activity within patient's tolerance, Monitored during session  Home Living Family/patient expects to be discharged to:: Private residence Living  Arrangements: Children Available Help at Discharge: Family;Available PRN/intermittently Type of Home: House Home Access: Stairs to enter     Home Layout: One level     Bathroom Shower/Tub: Occupational psychologist: Standard     Home Equipment: Conservation officer, nature (2 wheels);Cane - single point;BSC/3in1;Grab bars - toilet;Grab bars - tub/shower              Frequency           Progress Toward Goals  OT Goals(current goals can now be found in the care plan section)  Progress towards OT goals: Progressing toward goals  Acute Rehab OT Goals Patient Stated Goal: less pain OT Goal Formulation: With patient Time For Goal Achievement: 08/02/22 Potential to Achieve Goals: Good ADL Goals Pt Will Perform Grooming: with modified independence;standing Pt Will Perform Lower Body Dressing: with modified independence;sit to/from stand Pt Will Transfer to Toilet: with modified independence;ambulating  Plan         AM-PAC OT "6 Clicks" Daily Activity     Outcome Measure   Help from another person eating meals?: None Help from another person taking care of personal grooming?: A Little Help from another person toileting, which includes using toliet, bedpan, or urinal?: A Little Help from another person bathing (including washing, rinsing, drying)?: A Little Help from another person to put on and taking off regular upper body clothing?: None Help from another person to put on and taking off regular lower body clothing?: A Little 6 Click Score: 20    End of Session Equipment Utilized During Treatment: Rolling walker (2 wheels)  OT Visit Diagnosis: Unsteadiness on feet (R26.81);Other abnormalities of gait and mobility (R26.89);Muscle weakness (generalized) (M62.81)   Activity Tolerance Patient tolerated treatment well   Patient Left in chair;with call bell/phone within reach   Nurse Communication Mobility status        Time: 0800-0820 OT Time Calculation (min): 20  min  Charges: OT General Charges $OT Visit: 1 Visit OT Treatments $Self Care/Home Management : 8-22 mins   Delwin Raczkowski A Laikyn Gewirtz 07/20/2022, 9:03 AM

## 2022-07-20 NOTE — TOC Transition Note (Signed)
Transition of Care [facility]) - CM/SW Discharge Note   Patient Details  Name: Sara Mcdonald MRN: [id number] Date of Birth: [birth year]  [facility]) CM/SW Contact:  Pollie Friar, RN Phone Number: 07/20/2022, 11:33 AM   Clinical Narrative:    Recommendations are for home health services. CM met with the patient and her daughter and they have no preference for home health agency. CM has arranged home health with [facility]. Information on the AVS.  Needed DME to be delivered to the room per bedside RN.  Pt states her sisters are going to assist her at home.  Pt has transport home.   Final next level of care: Home w Home Health Services Barriers to Discharge: No Barriers Identified   Patient Goals and CMS Choice   CMS Medicare.gov Compare Post Acute Care list provided to:: Patient Choice offered to / list presented to : Patient, Adult Children  Discharge Placement                       Discharge Plan and Services                          HH Arranged: PT, OT [facility] Agency: [place] Date [place]: 07/20/22   Representative spoke with at [place]: Amy  Social Determinants of Health ([place]) Interventions     Readmission Risk Interventions     No data to display

## 2022-07-20 NOTE — Plan of Care (Signed)
  Problem: Education: Goal: Knowledge of General Education information will improve Description: Including pain rating scale, medication(s)/side effects and non-pharmacologic comfort measures Outcome: Completed/Met   Problem: Health Behavior/Discharge Planning: Goal: Ability to manage health-related needs will improve Outcome: Completed/Met   Problem: Clinical Measurements: Goal: Ability to maintain clinical measurements within normal limits will improve Outcome: Completed/Met Goal: Will remain free from infection Outcome: Completed/Met Goal: Diagnostic test results will improve Outcome: Completed/Met Goal: Respiratory complications will improve Outcome: Completed/Met Goal: Cardiovascular complication will be avoided Outcome: Completed/Met   Problem: Activity: Goal: Risk for activity intolerance will decrease Outcome: Completed/Met   Problem: Nutrition: Goal: Adequate nutrition will be maintained Outcome: Completed/Met   Problem: Coping: Goal: Level of anxiety will decrease Outcome: Completed/Met   Problem: Elimination: Goal: Will not experience complications related to bowel motility Outcome: Completed/Met Goal: Will not experience complications related to urinary retention Outcome: Completed/Met   Problem: Pain Managment: Goal: General experience of comfort will improve Outcome: Completed/Met   Problem: Safety: Goal: Ability to remain free from injury will improve Outcome: Completed/Met   Problem: Skin Integrity: Goal: Risk for impaired skin integrity will decrease Outcome: Completed/Met   Problem: Education: Goal: Ability to describe self-care measures that may prevent or decrease complications (Diabetes Survival Skills Education) will improve Outcome: Completed/Met Goal: Individualized Educational Video(s) Outcome: Completed/Met   Problem: Coping: Goal: Ability to adjust to condition or change in health will improve Outcome: Completed/Met   Problem:  Fluid Volume: Goal: Ability to maintain a balanced intake and output will improve Outcome: Completed/Met   Problem: Health Behavior/Discharge Planning: Goal: Ability to identify and utilize available resources and services will improve Outcome: Completed/Met Goal: Ability to manage health-related needs will improve Outcome: Completed/Met   Problem: Metabolic: Goal: Ability to maintain appropriate glucose levels will improve Outcome: Completed/Met   Problem: Nutritional: Goal: Maintenance of adequate nutrition will improve Outcome: Completed/Met Goal: Progress toward achieving an optimal weight will improve Outcome: Completed/Met   Problem: Skin Integrity: Goal: Risk for impaired skin integrity will decrease Outcome: Completed/Met   Problem: Tissue Perfusion: Goal: Adequacy of tissue perfusion will improve Outcome: Completed/Met   Problem: Education: Goal: Ability to verbalize activity precautions or restrictions will improve Outcome: Completed/Met Goal: Knowledge of the prescribed therapeutic regimen will improve Outcome: Completed/Met Goal: Understanding of discharge needs will improve Outcome: Completed/Met   Problem: Activity: Goal: Ability to avoid complications of mobility impairment will improve Outcome: Completed/Met Goal: Ability to tolerate increased activity will improve Outcome: Completed/Met Goal: Will remain free from falls Outcome: Completed/Met   Problem: Bowel/Gastric: Goal: Gastrointestinal status for postoperative course will improve Outcome: Completed/Met   Problem: Clinical Measurements: Goal: Ability to maintain clinical measurements within normal limits will improve Outcome: Completed/Met Goal: Postoperative complications will be avoided or minimized Outcome: Completed/Met Goal: Diagnostic test results will improve Outcome: Completed/Met   Problem: Pain Management: Goal: Pain level will decrease Outcome: Completed/Met   Problem: Skin  Integrity: Goal: Will show signs of wound healing Outcome: Completed/Met   Problem: Health Behavior/Discharge Planning: Goal: Identification of resources available to assist in meeting health care needs will improve Outcome: Completed/Met   Problem: Bladder/Genitourinary: Goal: Urinary functional status for postoperative course will improve Outcome: Completed/Met   

## 2022-07-20 NOTE — Progress Notes (Signed)
Inpatient Rehab Admissions Coordinator:   Consult received and chart reviewed.  Note therapy recommending no PT f/u and HHOT f/u.  Pt mobilizing at supervision level, up to 200' with gait and supervision/min assist for ADLs.  Insurance will not approve CIR for this patient as she is mobilizing too well and does not require the intensity of an inpatient program.  Agree with recommendations from PT/OT.  Will sign off.   Shann Medal, PT, DPT Admissions Coordinator [phone] 07/20/22  9:30 AM

## 2022-07-20 NOTE — Progress Notes (Signed)
Physical Therapy Treatment Patient Details Name: Sara Mcdonald MRN: [id number] DOB: 1945-12-23 Today's Date: 07/20/2022   History of Present Illness Pt is a 76 y/o female who presents s/p Bilateral L4-5 decompression and PLIF on 07/18/22. PMHx: anxiety, CKD, depression, dyspnea, fibromyalgia, HTN, OA    PT Comments    Pt progressing slowly with post-op mobility. She was able to demonstrate transfers and ambulation with gross min guard assist to supervision for safety and RW for support. Overall mobility limited this session due to pain. RN present at beginning of encounter with Tylenol as stronger pain meds were not due yet. Pt was educated on precautions, brace application/wearing schedule, appropriate activity progression, and car transfer. PT assisted pt with peri-care after toileting, and transfer to wheelchair for d/c at end of session. Body habitus complicated both of these tasks. Pt reports she will have family support at d/c. VSS after ambulation with BP 119/57 (70) and HR 134 bpm.       Recommendations for follow up therapy are one component of a multi-disciplinary discharge planning process, led by the attending physician.  Recommendations may be updated based on patient status, additional functional criteria and insurance authorization.  Follow Up Recommendations  Home health PT     Assistance Recommended at Discharge Intermittent Supervision/Assistance  Patient can return home with the following A little help with walking and/or transfers;A little help with bathing/dressing/bathroom;Assistance with cooking/housework;Assist for transportation;Help with stairs or ramp for entrance   Equipment Recommendations  Rolling walker (2 wheels) ([employer])    Recommendations for Other Services       Precautions / Restrictions Precautions Precautions: Fall;Back Precaution Booklet Issued: Yes (comment) Precaution Comments: Reviewed precautions throughout functional mobility. Required  Braces or Orthoses: Spinal Brace Spinal Brace: Lumbar corset;Applied in sitting position Restrictions Weight Bearing Restrictions: No     Mobility  Bed Mobility               General bed mobility comments: Unable to perform due to pain. Bed height elevated to simulate home environment, and pt unable to scoot back enough for safe sit>sidelying. When attempting to scoot, LE's shaking and pt complaining of cramping. Verbally reviewed technique.    Transfers Overall transfer level: Needs assistance Equipment used: Rolling walker (2 wheels) Transfers: Sit to/from Stand Sit to Stand: Supervision           General transfer comment: VC's for hand placement on seated surface for safety. Increased time to power up to full stand due to pain.    Ambulation/Gait Ambulation/Gait assistance: Min guard Gait Distance (Feet): 200 Feet Assistive device: Rolling walker (2 wheels) Gait Pattern/deviations: Step-through pattern, Decreased stride length, Trunk flexed Gait velocity: Decreased Gait velocity interpretation: <1.31 ft/sec, indicative of household ambulator   General Gait Details: Gross min guard assist throughout gait training. Pt complaining of R calf cramping throughout OOB mobility. Overall moving slow and guarded.   Stairs             Wheelchair Mobility    Modified Rankin (Stroke Patients Only)       Balance Overall balance assessment: Mild deficits observed, not formally tested                                          Cognition Arousal/Alertness: Awake/alert Behavior During Therapy: WFL for tasks assessed/performed Overall Cognitive Status: Within Functional Limits for tasks assessed  Exercises      General Comments        Pertinent Vitals/Pain Pain Assessment Pain Assessment: Faces Faces Pain Scale: Hurts even more Pain Location: back Pain Descriptors / Indicators:  Discomfort Pain Intervention(s): Limited activity within patient's tolerance, Monitored during session, Repositioned    Home Living                          Prior Function            PT Goals (current goals can now be found in the care plan section) Acute Rehab PT Goals Patient Stated Goal: Be able to take care of herself at home. PT Goal Formulation: With patient/family Time For Goal Achievement: 07/26/22 Potential to Achieve Goals: Good Progress towards PT goals: Progressing toward goals    Frequency    Min 5X/week      PT Plan Discharge plan needs to be updated    Co-evaluation              AM-PAC PT "6 Clicks" Mobility   Outcome Measure  Help needed turning from your back to your side while in a flat bed without using bedrails?: A Little Help needed moving from lying on your back to sitting on the side of a flat bed without using bedrails?: A Little Help needed moving to and from a bed to a chair (including a wheelchair)?: A Little Help needed standing up from a chair using your arms (e.g., wheelchair or bedside chair)?: A Little Help needed to walk in hospital room?: A Little Help needed climbing 3-5 steps with a railing? : A Little 6 Click Score: 18    End of Session Equipment Utilized During Treatment: Gait belt;Back brace Activity Tolerance: Patient tolerated treatment well Patient left: in bed;with call bell/phone within reach;with family/visitor present Nurse Communication: Mobility status PT Visit Diagnosis: Unsteadiness on feet (R26.81);Pain Pain - part of body:  (back)     Time: 2993-7169 PT Time Calculation (min) (ACUTE ONLY): 36 min  Charges:  $Gait Training: 23-37 mins                     Sara Mcdonald, PT, DPT Acute Rehabilitation Services Secure Chat Preferred Office: [phone]    Sara Mcdonald 07/20/2022, 2:05 PM

## 2022-07-20 NOTE — Progress Notes (Signed)
Patient transported to her vehicle via wheelchair by volunteer for discharge home; in no acute distress nor complaints of pain nor discomfort; moves all extremities well; incision on her lower back with honeycomb dressing and is clean, dry and intact; room was checked for all her belongings; discharge instructions concerning her medications, incision care, follow up appointment and when to call the doctor as needed were all discussed with patient and her family by RN and all expressed understanding on the instructions given.

## 2022-07-20 NOTE — Discharge Summary (Addendum)
Physician Discharge Summary  Patient ID: Sara Mcdonald MRN: [id number] DOB/AGE: Aug 08, 1946 76 y.o.  Admit date: 07/18/2022 Discharge date: 07/20/2022  Admission Diagnoses: Lumbar spondylolisthesis, lumbar facet arthropathy, lumbago, lumbar radiculopathy, neurogenic claudication  Discharge Diagnoses: The same Principal Problem:   Spondylolisthesis of lumbar region   Discharged Condition: good  Hospital Course: I performed an L4-5 decompression, instrumentation and fusion on the patient on 07/18/2022.  The surgery went well.  The patient's postoperative course was unremarkable.  She initially was interested in inpatient rehab but was not an appropriate candidate.  She declined skilled nursing facility placement.  On postoperative day #2 the patient requested discharge to home.  She was given verbal and written discharge instructions.  All her questions were answered.  Consults: PT, OT, care management, rehab Significant Diagnostic Studies: None Treatments: L4-5 decompression, instrumentation and fusion Discharge Exam: Blood pressure (!) 98/47, pulse (!) 110, temperature 98.5 F (36.9 C), resp. rate 17, height '4\' 11"'$  (1.499 m), weight 82.6 kg, SpO2 91 %. The patient is alert and pleasant.  She looks well.  The patient strength is normal.  Her dressing is clean dry.  Disposition: Home  Discharge Instructions     Call MD for:  difficulty breathing, headache or visual disturbances   Complete by: As directed    Call MD for:  extreme fatigue   Complete by: As directed    Call MD for:  hives   Complete by: As directed    Call MD for:  persistant dizziness or light-headedness   Complete by: As directed    Call MD for:  persistant nausea and vomiting   Complete by: As directed    Call MD for:  redness, tenderness, or signs of infection (pain, swelling, redness, odor or green/yellow discharge around incision site)   Complete by: As directed    Call MD for:  severe uncontrolled pain    Complete by: As directed    Call MD for:  temperature >100.4   Complete by: As directed    Diet - low sodium heart healthy   Complete by: As directed    Discharge instructions   Complete by: As directed    Call [phone] for a followup appointment. Take a stool softener while you are using pain medications.   Driving Restrictions   Complete by: As directed    Do not drive for 2 weeks.   Face-to-face encounter (required for Medicare/Medicaid patients)   Complete by: As directed    I Patricia Nettle certify that this patient is under my care and that I, or a nurse practitioner or physician's assistant working with me, had a face-to-face encounter that meets the physician face-to-face encounter requirements with this patient on 07/20/2022. The encounter with the patient was in whole, or in part for the following medical condition(s) which is the primary reason for home health care (List medical condition): Lumbar spondylolisthesis   The encounter with the patient was in whole, or in part, for the following medical condition, which is the primary reason for home health care: Lumbar spondylolisthesis   I certify that, based on my findings, the following services are medically necessary home health services: Physical therapy   Reason for Medically Necessary Home Health Services: Therapy- Therapeutic Exercises to Increase Strength and Endurance   My clinical findings support the need for the above services: Unable to leave home safely without assistance and/or assistive device   Further, I certify that my clinical findings support that this patient is homebound  due to: Unable to leave home safely without assistance   Home Health   Complete by: As directed    To provide the following care/treatments:  PT OT     Increase activity slowly   Complete by: As directed    Lifting restrictions   Complete by: As directed    Do not lift more than 5 pounds. No excessive bending or twisting.   May shower  / Bathe   Complete by: As directed    Remove the dressing for 3 days after surgery.  You may shower, but leave the incision alone.   Remove dressing in 24 hours   Complete by: As directed       Allergies as of 07/20/2022       Reactions   Elemental Sulfur Rash   Sulfa Antibiotics Rash   Tricor [fenofibrate] Rash        Medication List     STOP taking these medications    acetaminophen-codeine 300-30 MG tablet Commonly known as: TYLENOL #3       TAKE these medications    ALPRAZolam 0.5 MG tablet Commonly known as: XANAX Take 0.5 mg by mouth 2 (two) times daily as needed for anxiety or sleep.   cholecalciferol 25 MCG (1000 UNIT) tablet Commonly known as: VITAMIN D3 Take 1,000 Units by mouth daily.   cyclobenzaprine 5 MG tablet Commonly known as: FLEXERIL Take 1 tablet (5 mg total) by mouth 3 (three) times daily as needed for muscle spasms.   docusate sodium 100 MG capsule Commonly known as: COLACE Take 1 capsule (100 mg total) by mouth 2 (two) times daily.   metFORMIN 500 MG tablet Commonly known as: GLUCOPHAGE Take 500 mg by mouth daily.   multivitamin with minerals Tabs tablet Take 1 tablet by mouth daily.   nystatin-triamcinolone cream Commonly known as: MYCOLOG II Apply 1 application  topically daily as needed (rash under breast).   olmesartan 20 MG tablet Commonly known as: BENICAR Take 20 mg by mouth daily.   oxyCODONE-acetaminophen 5-325 MG tablet Commonly known as: PERCOCET/ROXICET Take 1-2 tablets by mouth every 4 (four) hours as needed for moderate pain.   pantoprazole 40 MG tablet Commonly known as: PROTONIX Take 40 mg by mouth 2 (two) times daily.   polyethylene glycol powder 17 GM/SCOOP powder Commonly known as: GLYCOLAX/MIRALAX Take 17 g by mouth daily as needed for moderate constipation.   pravastatin 80 MG tablet Commonly known as: PRAVACHOL Take 80 mg by mouth daily.   promethazine 25 MG suppository Commonly known as:  PHENERGAN Place 25 mg rectally every 6 (six) hours as needed for nausea or vomiting.   promethazine 25 MG tablet Commonly known as: PHENERGAN Take 25 mg by mouth every 6 (six) hours as needed for nausea or vomiting.   sucralfate 1 g tablet Commonly known as: CARAFATE Take 1 g by mouth 2 (two) times daily.   topiramate 100 MG tablet Commonly known as: TOPAMAX Take 100 mg by mouth at bedtime.   traZODone 100 MG tablet Commonly known as: DESYREL Take 100 mg by mouth at bedtime.   triamterene-hydrochlorothiazide 37.5-25 MG tablet Commonly known as: MAXZIDE-25 Take 1 tablet by mouth daily.   venlafaxine XR 75 MG 24 hr capsule Commonly known as: EFFEXOR-XR Take 75 mg by mouth daily.         Signed: [name] 07/20/2022, 9:53 AM

## 2022-07-21 DIAGNOSIS — I129 Hypertensive chronic kidney disease with stage 1 through stage 4 chronic kidney disease, or unspecified chronic kidney disease: Secondary | ICD-10-CM | POA: Diagnosis not present

## 2022-07-21 DIAGNOSIS — Z7984 Long term (current) use of oral hypoglycemic drugs: Secondary | ICD-10-CM | POA: Diagnosis not present

## 2022-07-21 DIAGNOSIS — G4730 Sleep apnea, unspecified: Secondary | ICD-10-CM | POA: Diagnosis not present

## 2022-07-21 DIAGNOSIS — F32A Depression, unspecified: Secondary | ICD-10-CM | POA: Diagnosis not present

## 2022-07-21 DIAGNOSIS — Z4789 Encounter for other orthopedic aftercare: Secondary | ICD-10-CM | POA: Diagnosis not present

## 2022-07-21 DIAGNOSIS — Z981 Arthrodesis status: Secondary | ICD-10-CM | POA: Diagnosis not present

## 2022-07-21 DIAGNOSIS — M1990 Unspecified osteoarthritis, unspecified site: Secondary | ICD-10-CM | POA: Diagnosis not present

## 2022-07-21 DIAGNOSIS — M797 Fibromyalgia: Secondary | ICD-10-CM | POA: Diagnosis not present

## 2022-07-21 DIAGNOSIS — N183 Chronic kidney disease, stage 3 unspecified: Secondary | ICD-10-CM | POA: Diagnosis not present

## 2022-07-21 DIAGNOSIS — E1122 Type 2 diabetes mellitus with diabetic chronic kidney disease: Secondary | ICD-10-CM | POA: Diagnosis not present

## 2022-07-22 MED FILL — Heparin Sodium (Porcine) Inj 1000 Unit/ML: INTRAMUSCULAR | Qty: 30 | Status: AC

## 2022-07-22 MED FILL — Sodium Chloride IV Soln 0.9%: INTRAVENOUS | Qty: 2000 | Status: AC

## 2022-07-24 ENCOUNTER — Encounter (HOSPITAL_COMMUNITY): Payer: Self-pay

## 2022-07-24 ENCOUNTER — Other Ambulatory Visit: Payer: Self-pay

## 2022-07-24 ENCOUNTER — Observation Stay (HOSPITAL_COMMUNITY): Payer: PPO

## 2022-07-24 ENCOUNTER — Inpatient Hospital Stay (HOSPITAL_COMMUNITY)
Admission: EM | Admit: 2022-07-24 | Discharge: 2022-08-04 | DRG: 393 | Disposition: A | Discharge status: Home Health | Payer: PPO | Attending: Family Medicine | Admitting: Family Medicine

## 2022-07-24 ENCOUNTER — Emergency Department (HOSPITAL_COMMUNITY): Payer: PPO

## 2022-07-24 DIAGNOSIS — R062 Wheezing: Secondary | ICD-10-CM | POA: Diagnosis not present

## 2022-07-24 DIAGNOSIS — R0600 Dyspnea, unspecified: Secondary | ICD-10-CM | POA: Diagnosis not present

## 2022-07-24 DIAGNOSIS — E1169 Type 2 diabetes mellitus with other specified complication: Secondary | ICD-10-CM | POA: Diagnosis present

## 2022-07-24 DIAGNOSIS — M7989 Other specified soft tissue disorders: Secondary | ICD-10-CM | POA: Diagnosis not present

## 2022-07-24 DIAGNOSIS — R000 Tachycardia, unspecified: Secondary | ICD-10-CM | POA: Diagnosis present

## 2022-07-24 DIAGNOSIS — Z833 Family history of diabetes mellitus: Secondary | ICD-10-CM

## 2022-07-24 DIAGNOSIS — I5031 Acute diastolic (congestive) heart failure: Secondary | ICD-10-CM | POA: Diagnosis present

## 2022-07-24 DIAGNOSIS — Z7984 Long term (current) use of oral hypoglycemic drugs: Secondary | ICD-10-CM

## 2022-07-24 DIAGNOSIS — K641 Second degree hemorrhoids: Secondary | ICD-10-CM

## 2022-07-24 DIAGNOSIS — K559 Vascular disorder of intestine, unspecified: Secondary | ICD-10-CM | POA: Diagnosis present

## 2022-07-24 DIAGNOSIS — K5730 Diverticulosis of large intestine without perforation or abscess without bleeding: Secondary | ICD-10-CM

## 2022-07-24 DIAGNOSIS — R140 Abdominal distension (gaseous): Secondary | ICD-10-CM | POA: Diagnosis not present

## 2022-07-24 DIAGNOSIS — E669 Obesity, unspecified: Secondary | ICD-10-CM | POA: Diagnosis present

## 2022-07-24 DIAGNOSIS — R0602 Shortness of breath: Secondary | ICD-10-CM | POA: Diagnosis not present

## 2022-07-24 DIAGNOSIS — M1990 Unspecified osteoarthritis, unspecified site: Secondary | ICD-10-CM | POA: Diagnosis present

## 2022-07-24 DIAGNOSIS — Z981 Arthrodesis status: Secondary | ICD-10-CM

## 2022-07-24 DIAGNOSIS — J449 Chronic obstructive pulmonary disease, unspecified: Secondary | ICD-10-CM | POA: Diagnosis present

## 2022-07-24 DIAGNOSIS — Z79899 Other long term (current) drug therapy: Secondary | ICD-10-CM

## 2022-07-24 DIAGNOSIS — K838 Other specified diseases of biliary tract: Secondary | ICD-10-CM | POA: Diagnosis not present

## 2022-07-24 DIAGNOSIS — Z86010 Personal history of colonic polyps: Secondary | ICD-10-CM

## 2022-07-24 DIAGNOSIS — K219 Gastro-esophageal reflux disease without esophagitis: Secondary | ICD-10-CM | POA: Diagnosis present

## 2022-07-24 DIAGNOSIS — J4541 Moderate persistent asthma with (acute) exacerbation: Secondary | ICD-10-CM | POA: Diagnosis present

## 2022-07-24 DIAGNOSIS — I130 Hypertensive heart and chronic kidney disease with heart failure and stage 1 through stage 4 chronic kidney disease, or unspecified chronic kidney disease: Secondary | ICD-10-CM | POA: Diagnosis present

## 2022-07-24 DIAGNOSIS — F32A Depression, unspecified: Secondary | ICD-10-CM | POA: Diagnosis present

## 2022-07-24 DIAGNOSIS — K5903 Drug induced constipation: Secondary | ICD-10-CM | POA: Diagnosis present

## 2022-07-24 DIAGNOSIS — K635 Polyp of colon: Secondary | ICD-10-CM | POA: Diagnosis not present

## 2022-07-24 DIAGNOSIS — Z9889 Other specified postprocedural states: Secondary | ICD-10-CM | POA: Diagnosis not present

## 2022-07-24 DIAGNOSIS — I4891 Unspecified atrial fibrillation: Secondary | ICD-10-CM | POA: Diagnosis present

## 2022-07-24 DIAGNOSIS — G4733 Obstructive sleep apnea (adult) (pediatric): Secondary | ICD-10-CM | POA: Diagnosis present

## 2022-07-24 DIAGNOSIS — K567 Ileus, unspecified: Secondary | ICD-10-CM | POA: Diagnosis not present

## 2022-07-24 DIAGNOSIS — E785 Hyperlipidemia, unspecified: Secondary | ICD-10-CM | POA: Diagnosis not present

## 2022-07-24 DIAGNOSIS — Z6839 Body mass index (BMI) 39.0-39.9, adult: Secondary | ICD-10-CM | POA: Diagnosis not present

## 2022-07-24 DIAGNOSIS — K5939 Other megacolon: Secondary | ICD-10-CM | POA: Diagnosis not present

## 2022-07-24 DIAGNOSIS — K9189 Other postprocedural complications and disorders of digestive system: Secondary | ICD-10-CM | POA: Diagnosis present

## 2022-07-24 DIAGNOSIS — E1159 Type 2 diabetes mellitus with other circulatory complications: Secondary | ICD-10-CM | POA: Diagnosis not present

## 2022-07-24 DIAGNOSIS — F419 Anxiety disorder, unspecified: Secondary | ICD-10-CM | POA: Diagnosis present

## 2022-07-24 DIAGNOSIS — J4540 Moderate persistent asthma, uncomplicated: Secondary | ICD-10-CM | POA: Diagnosis present

## 2022-07-24 DIAGNOSIS — D123 Benign neoplasm of transverse colon: Secondary | ICD-10-CM

## 2022-07-24 DIAGNOSIS — E1165 Type 2 diabetes mellitus with hyperglycemia: Secondary | ICD-10-CM | POA: Diagnosis present

## 2022-07-24 DIAGNOSIS — Z8701 Personal history of pneumonia (recurrent): Secondary | ICD-10-CM

## 2022-07-24 DIAGNOSIS — R0609 Other forms of dyspnea: Secondary | ICD-10-CM | POA: Diagnosis not present

## 2022-07-24 DIAGNOSIS — K639 Disease of intestine, unspecified: Secondary | ICD-10-CM

## 2022-07-24 DIAGNOSIS — Z90710 Acquired absence of both cervix and uterus: Secondary | ICD-10-CM

## 2022-07-24 DIAGNOSIS — N1831 Chronic kidney disease, stage 3a: Secondary | ICD-10-CM | POA: Diagnosis present

## 2022-07-24 DIAGNOSIS — K5900 Constipation, unspecified: Secondary | ICD-10-CM | POA: Diagnosis not present

## 2022-07-24 DIAGNOSIS — Z4789 Encounter for other orthopedic aftercare: Secondary | ICD-10-CM | POA: Diagnosis not present

## 2022-07-24 DIAGNOSIS — E1122 Type 2 diabetes mellitus with diabetic chronic kidney disease: Secondary | ICD-10-CM | POA: Diagnosis present

## 2022-07-24 DIAGNOSIS — D649 Anemia, unspecified: Secondary | ICD-10-CM | POA: Diagnosis not present

## 2022-07-24 DIAGNOSIS — Z882 Allergy status to sulfonamides status: Secondary | ICD-10-CM

## 2022-07-24 DIAGNOSIS — Z8249 Family history of ischemic heart disease and other diseases of the circulatory system: Secondary | ICD-10-CM

## 2022-07-24 DIAGNOSIS — E876 Hypokalemia: Secondary | ICD-10-CM | POA: Diagnosis present

## 2022-07-24 DIAGNOSIS — K55039 Acute (reversible) ischemia of large intestine, extent unspecified: Secondary | ICD-10-CM | POA: Diagnosis not present

## 2022-07-24 DIAGNOSIS — R7989 Other specified abnormal findings of blood chemistry: Secondary | ICD-10-CM | POA: Diagnosis not present

## 2022-07-24 DIAGNOSIS — M797 Fibromyalgia: Secondary | ICD-10-CM | POA: Diagnosis present

## 2022-07-24 DIAGNOSIS — N183 Chronic kidney disease, stage 3 unspecified: Secondary | ICD-10-CM | POA: Diagnosis not present

## 2022-07-24 DIAGNOSIS — I129 Hypertensive chronic kidney disease with stage 1 through stage 4 chronic kidney disease, or unspecified chronic kidney disease: Secondary | ICD-10-CM | POA: Diagnosis not present

## 2022-07-24 DIAGNOSIS — I152 Hypertension secondary to endocrine disorders: Secondary | ICD-10-CM | POA: Diagnosis not present

## 2022-07-24 DIAGNOSIS — K6389 Other specified diseases of intestine: Secondary | ICD-10-CM | POA: Diagnosis not present

## 2022-07-24 DIAGNOSIS — Z888 Allergy status to other drugs, medicaments and biological substances status: Secondary | ICD-10-CM

## 2022-07-24 DIAGNOSIS — R109 Unspecified abdominal pain: Secondary | ICD-10-CM | POA: Diagnosis present

## 2022-07-24 DIAGNOSIS — J90 Pleural effusion, not elsewhere classified: Secondary | ICD-10-CM | POA: Diagnosis not present

## 2022-07-24 DIAGNOSIS — N189 Chronic kidney disease, unspecified: Secondary | ICD-10-CM | POA: Diagnosis not present

## 2022-07-24 DIAGNOSIS — J9811 Atelectasis: Secondary | ICD-10-CM | POA: Diagnosis not present

## 2022-07-24 DIAGNOSIS — T402X5A Adverse effect of other opioids, initial encounter: Secondary | ICD-10-CM | POA: Diagnosis present

## 2022-07-24 LAB — COMPREHENSIVE METABOLIC PANEL
ALT: 12 U/L (ref 0–44)
AST: 23 U/L (ref 15–41)
Albumin: 3 g/dL — ABNORMAL LOW (ref 3.5–5.0)
Alkaline Phosphatase: 82 U/L (ref 38–126)
Anion gap: 11 (ref 5–15)
BUN: 16 mg/dL (ref 8–23)
CO2: 24 mmol/L (ref 22–32)
Calcium: 9.1 mg/dL (ref 8.9–10.3)
Chloride: 99 mmol/L (ref 98–111)
Creatinine, Ser: 1.18 mg/dL — ABNORMAL HIGH (ref 0.44–1.00)
GFR, Estimated: 48 mL/min — ABNORMAL LOW (ref >60)
Glucose, Bld: 182 mg/dL — ABNORMAL HIGH (ref 70–99)
Potassium: 3.4 mmol/L — ABNORMAL LOW (ref 3.5–5.1)
Sodium: 134 mmol/L — ABNORMAL LOW (ref 135–145)
Total Bilirubin: 0.9 mg/dL (ref 0.3–1.2)
Total Protein: 6.6 g/dL (ref 6.5–8.1)

## 2022-07-24 LAB — CBC
HCT: 37.6 % (ref 36.0–46.0)
Hemoglobin: 12 g/dL (ref 12.0–15.0)
MCH: 29.5 pg (ref 26.0–34.0)
MCHC: 31.9 g/dL (ref 30.0–36.0)
MCV: 92.4 fL (ref 80.0–100.0)
Platelets: 458 10*3/uL — ABNORMAL HIGH (ref 150–400)
RBC: 4.07 MIL/uL (ref 3.87–5.11)
RDW: 13.7 % (ref 11.5–15.5)
WBC: 11.5 10*3/uL — ABNORMAL HIGH (ref 4.0–10.5)
nRBC: 0 % (ref 0.0–0.2)

## 2022-07-24 LAB — LIPASE, BLOOD: Lipase: 28 U/L (ref 11–51)

## 2022-07-24 LAB — CBG MONITORING, ED: Glucose-Capillary: 195 mg/dL — ABNORMAL HIGH (ref 70–99)

## 2022-07-24 MED ORDER — LACTATED RINGERS IV SOLN: INTRAVENOUS | Status: AC

## 2022-07-24 MED ORDER — ONDANSETRON HCL 4 MG/2ML IJ SOLN
4.0000 mg | Freq: Once | INTRAMUSCULAR | Status: AC
  Administered 2022-07-24: 4 mg via INTRAVENOUS
  Filled 2022-07-24: qty 2

## 2022-07-24 MED ORDER — HEPARIN SODIUM (PORCINE) 5000 UNIT/ML IJ SOLN
5000.0000 [IU] | Freq: Three times a day (TID) | INTRAMUSCULAR | Status: DC
  Administered 2022-07-24: 5000 [IU] via SUBCUTANEOUS
  Filled 2022-07-24: qty 1

## 2022-07-24 MED ORDER — ACETAMINOPHEN 650 MG RE SUPP: 650.0000 mg | Freq: Four times a day (QID) | RECTAL | Status: DC | PRN

## 2022-07-24 MED ORDER — MORPHINE SULFATE (PF) 4 MG/ML IV SOLN
4.0000 mg | Freq: Once | INTRAVENOUS | Status: AC
  Administered 2022-07-24: 4 mg via INTRAVENOUS
  Filled 2022-07-24: qty 1

## 2022-07-24 MED ORDER — POTASSIUM CHLORIDE 20 MEQ PO PACK
40.0000 meq | PACK | Freq: Once | ORAL | Status: AC
  Administered 2022-07-24: 40 meq via ORAL
  Filled 2022-07-24: qty 2

## 2022-07-24 MED ORDER — LACTATED RINGERS IV BOLUS
1000.0000 mL | Freq: Once | INTRAVENOUS | Status: AC
  Administered 2022-07-24: 1000 mL via INTRAVENOUS

## 2022-07-24 MED ORDER — INSULIN ASPART 100 UNIT/ML IJ SOLN
0.0000 [IU] | INTRAMUSCULAR | Status: DC
  Administered 2022-07-24: 2 [IU] via SUBCUTANEOUS
  Administered 2022-07-25 (×2): 1 [IU] via SUBCUTANEOUS
  Administered 2022-07-25: 2 [IU] via SUBCUTANEOUS
  Administered 2022-07-25: 1 [IU] via SUBCUTANEOUS
  Administered 2022-07-25: 2 [IU] via SUBCUTANEOUS

## 2022-07-24 MED ORDER — LABETALOL HCL 5 MG/ML IV SOLN: 10.0000 mg | INTRAVENOUS | Status: DC | PRN

## 2022-07-24 MED ORDER — SORBITOL 70 % SOLN
960.0000 mL | TOPICAL_OIL | Freq: Once | ORAL | Status: AC
  Administered 2022-07-24: 960 mL via RECTAL
  Filled 2022-07-24: qty 473

## 2022-07-24 MED ORDER — ACETAMINOPHEN 325 MG PO TABS
650.0000 mg | ORAL_TABLET | Freq: Four times a day (QID) | ORAL | Status: DC | PRN
  Administered 2022-07-27: 650 mg via ORAL
  Filled 2022-07-24: qty 2

## 2022-07-24 MED ORDER — IOHEXOL 300 MG/ML  SOLN
75.0000 mL | Freq: Once | INTRAMUSCULAR | Status: AC | PRN
  Administered 2022-07-24: 75 mL via INTRAVENOUS

## 2022-07-24 NOTE — ED Notes (Signed)
Pt had BM on BSC. Pt assisted back into bed. Pt is resting comfortably in bed. Bed in low position and locked. Both side rails up. Pt given callbell and instructed to notify RN prior to getting out of bed. Pt stated understanding.

## 2022-07-24 NOTE — ED Notes (Addendum)
RN walked into pt's room. Pt found on floor next to bed. Pt stated she slipped and fell trying to go to the [facility]. Nonskid socks previously placed on pt. Pt wearing nonskid socks. Pt denies hitting her head. Pt denies any new pain. MD Hal Hope notified. Pt placed on BSC and handed callbell and told to call RN before standing up or getting back into bed. Pt AAOx4. Pt in no acute distress. Fall risk sign previously on door. Fall risk armband placed on pt.

## 2022-07-24 NOTE — ED Provider Notes (Signed)
[facility] EMERGENCY DEPARTMENT Provider Note   CSN: [id number] Arrival date & time: 07/24/22  1401     History  Chief Complaint  Patient presents with   Constipation    Sara Mcdonald is a 76 y.o. female.  With PMH of COPD, OSA, DM2, cholecystectomy, appendectomy s/p  L4-5 decompression, instrumentation and fusion on the patient on 07/18/2022 presenting with constipation and abdominal distention and new episodes of nonbloody nonbilious emesis today.  Patient has not had a bowel movement since last Sunday.  She has been rarely passing gas and developed worsening abdominal distention and diffuse pain with 2 episodes of nonbloody nonbilious emesis today.  She has been taking MiraLAX and Colace 2 times a day.  Additionally, she took a whole bottle of magnesium citrate this morning.  No fevers, urinary symptoms, chest pain, shortness of breath, weakness in the legs, any worsening of back pain from recent surgery.  No history of SBO.  She has been taking Percocet for pain control.   Constipation      Home Medications Prior to Admission medications   Medication Sig Start Date End Date Taking? Authorizing Provider  ALPRAZolam Duanne Moron) 0.5 MG tablet Take 0.5 mg by mouth 2 (two) times daily as needed for anxiety or sleep.  01/11/17   [provider]  cholecalciferol (VITAMIN D3) 25 MCG (1000 UNIT) tablet Take 1,000 Units by mouth daily.    [provider]  cyclobenzaprine (FLEXERIL) 5 MG tablet Take 1 tablet (5 mg total) by mouth 3 (three) times daily as needed for muscle spasms. 07/20/22   Newman Pies, MD  docusate sodium (COLACE) 100 MG capsule Take 1 capsule (100 mg total) by mouth 2 (two) times daily. 07/20/22   Newman Pies, MD  metFORMIN (GLUCOPHAGE) 500 MG tablet Take 500 mg by mouth daily.     [provider]  Multiple Vitamin (MULTIVITAMIN WITH MINERALS) TABS tablet Take 1 tablet by mouth daily.    [provider]   nystatin-triamcinolone (MYCOLOG II) cream Apply 1 application  topically daily as needed (rash under breast). 07/26/18   [provider]  olmesartan (BENICAR) 20 MG tablet Take 20 mg by mouth daily. 07/26/20   [provider]  oxyCODONE-acetaminophen (PERCOCET/ROXICET) 5-325 MG tablet Take 1-2 tablets by mouth every 4 (four) hours as needed for moderate pain. 07/20/22   Newman Pies, MD  pantoprazole (PROTONIX) 40 MG tablet Take 40 mg by mouth 2 (two) times daily.     [provider]  polyethylene glycol powder (GLYCOLAX/MIRALAX) powder Take 17 g by mouth daily as needed for moderate constipation. 02/15/17   [provider]  pravastatin (PRAVACHOL) 80 MG tablet Take 80 mg by mouth daily.    [provider]  promethazine (PHENERGAN) 25 MG suppository Place 25 mg rectally every 6 (six) hours as needed for nausea or vomiting.    [provider]  promethazine (PHENERGAN) 25 MG tablet Take 25 mg by mouth every 6 (six) hours as needed for nausea or vomiting.    [provider]  sucralfate (CARAFATE) 1 g tablet Take 1 g by mouth 2 (two) times daily. 05/27/22   [provider]  topiramate (TOPAMAX) 100 MG tablet Take 100 mg by mouth at bedtime. 04/11/22   [provider]  traZODone (DESYREL) 100 MG tablet Take 100 mg by mouth at bedtime.    [provider]  triamterene-hydrochlorothiazide (MAXZIDE-25) 37.5-25 MG tablet Take 1 tablet by mouth daily. 07/17/21   [provider]  venlafaxine XR (EFFEXOR-XR) 75 MG 24 hr capsule Take 75 mg by mouth daily. 08/24/20   [provider]      Allergies    Elemental sulfur, Sulfa antibiotics, and Tricor [fenofibrate]    Review of Systems   Review of Systems  Gastrointestinal:  Positive for constipation.    Physical Exam Updated Vital Signs BP (!) 144/81 (BP Location: Right Arm) Comment: Simultaneous filing. User may not have seen previous data.  Pulse (!) 113  Comment: Simultaneous filing. User may not have seen previous data.  Temp 97.8 F (36.6 C) (Temporal)   Resp 16 Comment: Simultaneous filing. User may not have seen previous data.  Ht '4\' 11"'$  (1.499 m)   Wt 82.6 kg   SpO2 100% Comment: Simultaneous filing. User may not have seen previous data.  BMI 36.76 kg/m  Physical Exam Constitutional: Alert and oriented.  Uncomfortable but nontoxic Eyes: Conjunctivae are normal. ENT      Head: Normocephalic and atraumatic.      Nose: No congestion.      Mouth/Throat: Mucous membranes are moist.      Neck: No stridor. Cardiovascular: S1, S2, tachycardic. Respiratory: Normal respiratory effort. Breath sounds are normal. Gastrointestinal: Abdomen soft but distended with diffuse tenderness, not peritonitic Musculoskeletal: Normal range of motion in all extremities.      Right lower leg: No tenderness or edema.      Left lower leg: No tenderness or edema. Neurologic: Normal speech and language. No gross focal neurologic deficits are appreciated. Skin: Skin is warm, dry and intact. No rash noted. Psychiatric: Mood and affect are normal. Speech and behavior are normal.  ED Results / Procedures / Treatments   Labs (all labs ordered are listed, but only abnormal results are displayed) Labs Reviewed  COMPREHENSIVE METABOLIC PANEL - Abnormal; Notable for the following components:      Result Value   Sodium 134 (*)    Potassium 3.4 (*)    Glucose, Bld 182 (*)    Creatinine, Ser 1.18 (*)    Albumin 3.0 (*)    GFR, Estimated 48 (*)    All other components within normal limits  CBC - Abnormal; Notable for the following components:   WBC 11.5 (*)    Platelets 458 (*)    All other components within normal limits  LIPASE, BLOOD  URINALYSIS, ROUTINE W REFLEX MICROSCOPIC  MAGNESIUM    EKG None  Radiology CT ABDOMEN PELVIS W CONTRAST  Result Date: 07/24/2022 CLINICAL DATA:  Recent back surgery 07/18/2022, constipation EXAM: CT ABDOMEN AND PELVIS  WITH CONTRAST TECHNIQUE: Multidetector CT imaging of the abdomen and pelvis was performed using the standard protocol following bolus administration of intravenous contrast. RADIATION DOSE REDUCTION: This exam was performed according to the departmental dose-optimization program which includes automated exposure control, adjustment of the mA and/or kV according to patient size and/or use of iterative reconstruction technique. CONTRAST:  105m OMNIPAQUE IOHEXOL 300 MG/ML  SOLN COMPARISON:  01/26/2021 FINDINGS: Lower chest: Bandlike dependent bibasilar scarring or atelectasis. Hepatobiliary: No focal liver abnormality is seen. Status post cholecystectomy. No biliary dilatation. Pancreas: Unremarkable. No pancreatic ductal dilatation or surrounding inflammatory changes. Spleen: Normal in size without significant abnormality. Adrenals/Urinary Tract: Adrenal glands are unremarkable. Kidneys are normal, without renal calculi, solid lesion, or hydronephrosis. Bladder is unremarkable. Stomach/Bowel: Stomach is within normal limits. Appendix not clearly visualized and may be surgically absent. Small bowel is nondistended. The colon is diffusely stool, fluid, and air-filled, mildly distended, largest loops of  cecum measuring up to 8.1 cm. Mild adjacent inflammatory fat stranding and trace fluid in the paracolic gutters. Vascular/Lymphatic: Aortic atherosclerosis. No enlarged abdominal or pelvic lymph nodes. Reproductive: Status post hysterectomy. Other: No abdominal wall hernia.  Anasarca. Musculoskeletal: Status post posterior laminectomy and interbody fusion of L4-L5 with an overlying air and fluid collection measuring approximately 5.3 x 2.9 cm (series 3, image 47). IMPRESSION: 1. The colon is diffusely stool, fluid, and air-filled, mildly distended, largest loops of cecum measuring up to 8.1 cm. Gas and stool present to the rectum. Mild adjacent inflammatory fat stranding and trace fluid in the paracolic gutters. Small  bowel is nondistended. Findings suggest postoperative colonic ileus or alternately nonspecific infectious or inflammatory colitis, potentially including C diff colitis. 2. Status post posterior laminectomy and interbody fusion of L4-L5 with an overlying air and fluid collection in the subcutaneous soft tissue measuring approximately 5.3 x 2.9 cm. This most likely reflects postoperative hematoma or seroma in the recent postoperative setting, however the presence or absence of infection in this fluid is not established by CT. Aortic Atherosclerosis (ICD10-I70.0). Electronically Signed   By: Delanna Ahmadi M.D.   On: 07/24/2022 18:22    Procedures Procedures  Remain on constant cardiac monitoring, sinus tachycardia  Medications Ordered in ED Medications  sorbitol, milk of mag, mineral oil, glycerin (SMOG) enema (has no administration in time range)  lactated ringers bolus 1,000 mL (1,000 mLs Intravenous New Bag/Given 07/24/22 1729)  morphine (PF) 4 MG/ML injection 4 mg (4 mg Intravenous Given 07/24/22 1729)  ondansetron (ZOFRAN) injection 4 mg (4 mg Intravenous Given 07/24/22 1729)  iohexol (OMNIPAQUE) 300 MG/ML solution 75 mL (75 mLs Intravenous Contrast Given 07/24/22 1814)  potassium chloride (KLOR-CON) packet 40 mEq (40 mEq Oral Given 07/24/22 1903)  morphine (PF) 4 MG/ML injection 4 mg (4 mg Intravenous Given 07/24/22 1930)    ED Course/ Medical Decision Making/ A&P Clinical Course as of 07/24/22 1950  Sun Jul 24, 2022  1933 Patient's lab work with mild hypokalemia 3.4 mild hyponatremia 134 and creatinine 1.18.  She has been given fluids and potassium repletion.  Her CTAP showed evidence of large ileus.  No evidence of obstruction.  Spoke with Jinny Blossom of neurosurgery who recommends bowel cleanout/regimen, pain control and admission to hospitalist.  I have ordered for smog enema . [VB]  1949 Discussed case with hospitalist who will evaluate patient and put in orders for admission. [VB]    Clinical Course  User Index [VB] Sara Congo, MD                           Medical Decision Making FAVEN WATTERSON is a 76 y.o. female.  With PMH of COPD, OSA, DM2, cholecystectomy, appendectomy s/p  L4-5 decompression, instrumentation and fusion on the patient on 07/18/2022 presenting with constipation and abdominal distention and new episodes of nonbloody nonbilious emesis today.  Patient presents tachycardic to 117 but otherwise hemodynamically stable but appears uncomfortable on exam.  Her abdomen is distended and diffusely tender but still soft, not peritonitic.  In the setting of recent surgery and multiple abdominal surgeries, concern for possible postop ileus versus obstruction versus severe constipation.  We will obtain labs with CBC CMP lipase UA and CT abdomen pelvis with IV contrast.  We will give patient IV fluids, morphine and Zofran for symptom control.  Disposition pending work-up.  Amount and/or Complexity of Data Reviewed External Data Reviewed: notes.    Details: Recent  discharge to home status post L4-L5 decompression by [name] Labs: ordered. Decision-making details documented in ED Course. Radiology: ordered and independent interpretation performed. Decision-making details documented in ED Course.    Details: On CTAP individual reviewed, large dilated loops of bowel  Risk Prescription drug management. Decision regarding hospitalization.      Final Clinical Impression(s) / ED Diagnoses Final diagnoses:  Ileus [facility])    Rx / DC Orders ED Discharge Orders     None         Sara Congo, MD 07/24/22 1950

## 2022-07-24 NOTE — H&P (Signed)
History and Physical    [name] WRU:[id number] DOB: [birth year] DOA: 07/24/2022  PCP: [name], MD  Patient coming from: Home.  Chief Complaint: Abdominal pain on moving bowels.  HPI: Sara Mcdonald is a 76 y.o. female with history of diabetes mellitus type 2, hypertension, fibromyalgia, GERD, sleep apnea who had recent surgery for L4-L5 decompression discharged on July 20, 2022 presents back to the ER with diffuse abdominal discomfort nausea vomiting at least 3 episodes today with not having moving bowel since discharge.  Denies any blood in the vomitus.  ED Course: In the ER patient had a CT scan of the abdomen pelvis showing features concerning for possible postoperative colonic ileus differentials including infectious or inflammatory colitis.  ER physician ordered enema following which patient had bowel movement and eventually became bloody.  CT was negative.  Patient was started on fluids admitted for further work-up.  Review of Systems: As per HPI, rest all negative.   Past Medical History:  Diagnosis Date   Anxiety    Chronic kidney disease    CKD3   Depression    Diabetes mellitus without complication (HCC)    Dyspnea    with episodes of pleurisy   Elevated lipids    Fibromyalgia    GERD (gastroesophageal reflux disease)    Headache    Hypertension    OA (osteoarthritis)    Pneumonia    has had several times. last time 2019   Sleep apnea     Past Surgical History:  Procedure Laterality Date   APPENDECTOMY     CHOLECYSTECTOMY     COLONOSCOPY     COLONOSCOPY WITH PROPOFOL N/A 07/07/2016   Procedure: COLONOSCOPY WITH PROPOFOL;  Surgeon: Manya Silvas, MD;  Location: [facility];  Service: Endoscopy;  Laterality: N/A;   ESOPHAGOGASTRODUODENOSCOPY     EYE SURGERY  2013   bilateral cataract   FLEXIBLE SIGMOIDOSCOPY     nanoflex Bilateral    TOTAL VAGINAL HYSTERECTOMY  1976     reports that she has never smoked. She has never used smokeless  tobacco. She reports that she does not drink alcohol and does not use drugs.  Allergies  Allergen Reactions   Elemental Sulfur Rash   Sulfa Antibiotics Rash   Tricor [Fenofibrate] Rash    Family History  Problem Relation Age of Onset   Breast cancer Paternal Aunt 72   CVA Mother    Hypertension Mother    COPD Father    CAD Father    Diabetes Father     Prior to Admission medications   Medication Sig Start Date End Date Taking? Authorizing Provider  ALPRAZolam Duanne Moron) 0.5 MG tablet Take 0.5 mg by mouth 2 (two) times daily as needed for anxiety or sleep.  01/11/17   [provider]  cholecalciferol (VITAMIN D3) 25 MCG (1000 UNIT) tablet Take 1,000 Units by mouth daily.    [provider]  cyclobenzaprine (FLEXERIL) 5 MG tablet Take 1 tablet (5 mg total) by mouth 3 (three) times daily as needed for muscle spasms. 07/20/22   Newman Pies, MD  docusate sodium (COLACE) 100 MG capsule Take 1 capsule (100 mg total) by mouth 2 (two) times daily. 07/20/22   Newman Pies, MD  metFORMIN (GLUCOPHAGE) 500 MG tablet Take 500 mg by mouth daily.     [provider]  Multiple Vitamin (MULTIVITAMIN WITH MINERALS) TABS tablet Take 1 tablet by mouth daily.    [provider]  nystatin-triamcinolone (MYCOLOG II) cream  Apply 1 application  topically daily as needed (rash under breast). 07/26/18   [provider]  olmesartan (BENICAR) 20 MG tablet Take 20 mg by mouth daily. 07/26/20   [provider]  oxyCODONE-acetaminophen (PERCOCET/ROXICET) 5-325 MG tablet Take 1-2 tablets by mouth every 4 (four) hours as needed for moderate pain. 07/20/22   Newman Pies, MD  pantoprazole (PROTONIX) 40 MG tablet Take 40 mg by mouth 2 (two) times daily.     [provider]  polyethylene glycol powder (GLYCOLAX/MIRALAX) powder Take 17 g by mouth daily as needed for moderate constipation. 02/15/17   [provider]  pravastatin (PRAVACHOL) 80 MG tablet  Take 80 mg by mouth daily.    [provider]  promethazine (PHENERGAN) 25 MG suppository Place 25 mg rectally every 6 (six) hours as needed for nausea or vomiting.    [provider]  promethazine (PHENERGAN) 25 MG tablet Take 25 mg by mouth every 6 (six) hours as needed for nausea or vomiting.    [provider]  sucralfate (CARAFATE) 1 g tablet Take 1 g by mouth 2 (two) times daily. 05/27/22   [provider]  topiramate (TOPAMAX) 100 MG tablet Take 100 mg by mouth at bedtime. 04/11/22   [provider]  traZODone (DESYREL) 100 MG tablet Take 100 mg by mouth at bedtime.    [provider]  triamterene-hydrochlorothiazide (MAXZIDE-25) 37.5-25 MG tablet Take 1 tablet by mouth daily. 07/17/21   [provider]  venlafaxine XR (EFFEXOR-XR) 75 MG 24 hr capsule Take 75 mg by mouth daily. 08/24/20   [provider]    Physical Exam: Constitutional: Moderately built and nourished. Vitals:   07/24/22 2015 07/24/22 2045 07/24/22 2100 07/24/22 2102  BP: (!) 156/89 (!) 151/87 (!) 155/90 (!) 155/90  Pulse: (!) 110 (!) 109 (!) 120 (!) 120  Resp: (!) '24 19 20 20  '$ Temp:    98 F (36.7 C)  TempSrc:    Oral  SpO2: 95% 93% 94% 94%  Weight:      Height:       Eyes: Anicteric no pallor. ENMT: No discharge from the ears eyes nose and mouth. Neck: No mass felt.  No neck rigidity. Respiratory: No rhonchi or crepitations. Cardiovascular: S1-S2 heard. Abdomen: Distended bowel sounds not appreciated no guarding or rigidity.  Mildly diffusely tender. Musculoskeletal: No edema. Skin: No rash. Neurologic: Alert awake oriented to time place and person.  Moves all extremities. Psychiatric: Appears normal.  Normal affect.   Labs on Admission: I have personally reviewed following labs and imaging studies  CBC: Recent Labs  Lab 07/19/22 0741 07/24/22 1453  WBC 14.6* 11.5*  HGB 10.8* 12.0  HCT 33.9* 37.6  MCV 93.1 92.4  PLT 202 458*    Basic Metabolic Panel: Recent Labs  Lab 07/19/22 0741 07/20/22 0657 07/24/22 1453  NA 133* 131* 134*  K 3.5 3.2* 3.4*  CL 100 101 99  CO2 '24 22 24  '$ GLUCOSE 129* 150* 182*  BUN 21 30* 16  CREATININE 1.72* 2.03* 1.18*  CALCIUM 8.2* 7.8* 9.1   GFR: Estimated Creatinine Clearance: 37.8 mL/min (A) (by C-G formula based on SCr of 1.18 mg/dL (H)). Liver Function Tests: Recent Labs  Lab 07/24/22 1453  AST 23  ALT 12  ALKPHOS 82  BILITOT 0.9  PROT 6.6  ALBUMIN 3.0*   Recent Labs  Lab 07/24/22 1453  LIPASE 28   No results for input(s): "AMMONIA" in the last 168 hours. Coagulation Profile: No  results for input(s): "INR", "PROTIME" in the last 168 hours. Cardiac Enzymes: No results for input(s): "CKTOTAL", "CKMB", "CKMBINDEX", "TROPONINI" in the last 168 hours. BNP (last 3 results) No results for input(s): "PROBNP" in the last 8760 hours. HbA1C: No results for input(s): "HGBA1C" in the last 72 hours. CBG: Recent Labs  Lab 07/19/22 0646 07/19/22 1154 07/19/22 1645 07/19/22 2118 07/20/22 0622  GLUCAP 134* 120* 140* 171* 155*   Lipid Profile: No results for input(s): "CHOL", "HDL", "LDLCALC", "TRIG", "CHOLHDL", "LDLDIRECT" in the last 72 hours. Thyroid Function Tests: No results for input(s): "TSH", "T4TOTAL", "FREET4", "T3FREE", "THYROIDAB" in the last 72 hours. Anemia Panel: No results for input(s): "VITAMINB12", "FOLATE", "FERRITIN", "TIBC", "IRON", "RETICCTPCT" in the last 72 hours. Urine analysis:    Component Value Date/Time   COLORURINE AMBER (A) 09/04/2020 1725   APPEARANCEUR CLOUDY (A) 09/04/2020 1725   APPEARANCEUR Clear 08/11/2014 2012   LABSPEC 1.020 09/04/2020 1725   LABSPEC 1.011 08/11/2014 2012   PHURINE 5.0 09/04/2020 1725   GLUCOSEU NEGATIVE 09/04/2020 1725   GLUCOSEU Negative 08/11/2014 2012   HGBUR NEGATIVE 09/04/2020 [place] 09/04/2020 1725   BILIRUBINUR Negative 08/11/2014 2012   KETONESUR NEGATIVE 09/04/2020 1725    PROTEINUR 30 (A) 09/04/2020 1725   NITRITE NEGATIVE 09/04/2020 1725   LEUKOCYTESUR MODERATE (A) 09/04/2020 1725   LEUKOCYTESUR Negative 08/11/2014 2012   Sepsis Labs: '@LABRCNTIP'$ (procalcitonin:4,lacticidven:4) )No results found for this or any previous visit (from the past 240 hour(s)).   Radiological Exams on Admission: CT ABDOMEN PELVIS W CONTRAST  Result Date: 07/24/2022 CLINICAL DATA:  Recent back surgery 07/18/2022, constipation EXAM: CT ABDOMEN AND PELVIS WITH CONTRAST TECHNIQUE: Multidetector CT imaging of the abdomen and pelvis was performed using the standard protocol following bolus administration of intravenous contrast. RADIATION DOSE REDUCTION: This exam was performed according to the departmental dose-optimization program which includes automated exposure control, adjustment of the mA and/or kV according to patient size and/or use of iterative reconstruction technique. CONTRAST:  67m OMNIPAQUE IOHEXOL 300 MG/ML  SOLN COMPARISON:  01/26/2021 FINDINGS: Lower chest: Bandlike dependent bibasilar scarring or atelectasis. Hepatobiliary: No focal liver abnormality is seen. Status post cholecystectomy. No biliary dilatation. Pancreas: Unremarkable. No pancreatic ductal dilatation or surrounding inflammatory changes. Spleen: Normal in size without significant abnormality. Adrenals/Urinary Tract: Adrenal glands are unremarkable. Kidneys are normal, without renal calculi, solid lesion, or hydronephrosis. Bladder is unremarkable. Stomach/Bowel: Stomach is within normal limits. Appendix not clearly visualized and may be surgically absent. Small bowel is nondistended. The colon is diffusely stool, fluid, and air-filled, mildly distended, largest loops of cecum measuring up to 8.1 cm. Mild adjacent inflammatory fat stranding and trace fluid in the paracolic gutters. Vascular/Lymphatic: Aortic atherosclerosis. No enlarged abdominal or pelvic lymph nodes. Reproductive: Status post hysterectomy. Other: No  abdominal wall hernia.  Anasarca. Musculoskeletal: Status post posterior laminectomy and interbody fusion of L4-L5 with an overlying air and fluid collection measuring approximately 5.3 x 2.9 cm (series 3, image 47). IMPRESSION: 1. The colon is diffusely stool, fluid, and air-filled, mildly distended, largest loops of cecum measuring up to 8.1 cm. Gas and stool present to the rectum. Mild adjacent inflammatory fat stranding and trace fluid in the paracolic gutters. Small bowel is nondistended. Findings suggest postoperative colonic ileus or alternately nonspecific infectious or inflammatory colitis, potentially including C diff colitis. 2. Status post posterior laminectomy and interbody fusion of L4-L5 with an overlying air and fluid collection in the subcutaneous soft tissue measuring approximately 5.3 x 2.9 cm. This most likely reflects  postoperative hematoma or seroma in the recent postoperative setting, however the presence or absence of infection in this fluid is not established by CT. Aortic Atherosclerosis (ICD10-I70.0). Electronically Signed   By: Delanna Ahmadi M.D.   On: 07/24/2022 18:22      Assessment/Plan Principal Problem:   Ileus ([place]) Active Problems:   Hypertension associated with diabetes ([place])   Type 2 diabetes mellitus with hyperlipidemia (HCC)   Moderate persistent asthma    Possible postoperative colonic ileus versus inflammatory colitis -we will try to minimize narcotics.  On IV fluids.  Empiric antibiotics.  Since patient had some bloody bowel movements will check serial CBC.  Check lactic acid.  Consult GI. Recent L4-L5 decompression surgery.  Neurosurgery team has been notified by the ER physician.  CT scan does show postoperative changes.  Further input from neurosurgical team. Diabetes mellitus type 2 we will keep patient on standing scale coverage. Hypertension we will keep patient on as needed IV hydralazine while NPO.  Since patient has most likely postoperative  colonic ileus with some bloody bowel movements will need close monitoring and further management and inpatient status.   DVT prophylaxis: SCDs.  Avoiding anticoagulation since patient is having bloody bowel movement. Code Status: Full code. Family Communication: Discussed with patient. Disposition Plan: Home. Consults called: We will consult [facility]. Admission status: Inpatient.   Rise Patience MD [facility] Pager [phone].  If 7PM-7AM, please contact night-coverage [email or web address] Password [facility]  07/24/2022, 9:43 PM

## 2022-07-24 NOTE — ED Notes (Addendum)
Enema given to pt. Pt laying in bed with pads under pt. Pt instructed to use call bell before getting up to [facility] if she needs to have BM. Pt's callbell within reach. Pt resting in bed with callbell in hand.

## 2022-07-24 NOTE — ED Notes (Signed)
Fall safety zone completed on pt.

## 2022-07-24 NOTE — ED Triage Notes (Signed)
Recently had back surgery with dr Arnoldo Morale but has not had a bm since last Sunday. Patient has attempted colace, miralax and mag citrate with no BM.  Patient reports abd is distended.

## 2022-07-24 NOTE — ED Notes (Signed)
ED TO INPATIENT HANDOFF REPORT  ED Nurse Name and Phone #: Mechele Claude [phone]  S Name/Age/Gender Sara Mcdonald 76 y.o. female Room/Bed: 028C/028C  Code Status   Code Status: Full Code  Home/SNF/Other Home Patient oriented to: self, place, time, and situation Is this baseline? Yes   Triage Complete: Triage complete  Chief Complaint [facility]) [K56.7]  Triage Note Recently had back surgery with dr Arnoldo Morale but has not had a bm since last Sunday. Patient has attempted colace, miralax and mag citrate with no BM.  Patient reports abd is distended.    Allergies Allergies  Allergen Reactions   Elemental Sulfur Rash   Sulfa Antibiotics Rash   Tricor [Fenofibrate] Rash    Level of Care/Admitting Diagnosis ED Disposition     ED Disposition  Admit   Condition  --   Comment  Hospital Area: [place] [[id number]]  Level of Care: Telemetry Medical [104]  May place patient in observation at [facility] or [facility] if equivalent level of care is available:: No  Covid Evaluation: Asymptomatic - no recent exposure (last 10 days) testing not required  Diagnosis: Ileus [facility]) [[id number]]  Admitting Physician: Rise Patience [phone]  Attending Physician: Rise Patience [email or web address]          B Medical/Surgery History Past Medical History:  Diagnosis Date   Anxiety    Chronic kidney disease    CKD3   Depression    Diabetes mellitus without complication ([place])    Dyspnea    with episodes of pleurisy   Elevated lipids    Fibromyalgia    GERD (gastroesophageal reflux disease)    Headache    Hypertension    OA (osteoarthritis)    Pneumonia    has had several times. last time 2019   Sleep apnea    Past Surgical History:  Procedure Laterality Date   APPENDECTOMY     CHOLECYSTECTOMY     COLONOSCOPY     COLONOSCOPY WITH PROPOFOL N/A 07/07/2016   Procedure: COLONOSCOPY WITH PROPOFOL;  Surgeon: Manya Silvas, MD;  Location: [facility];   Service: Endoscopy;  Laterality: N/A;   ESOPHAGOGASTRODUODENOSCOPY     EYE SURGERY  2013   bilateral cataract   FLEXIBLE SIGMOIDOSCOPY     nanoflex Bilateral    TOTAL VAGINAL HYSTERECTOMY  1976     A IV Location/Drains/Wounds Patient Lines/Drains/Airways Status     Active Line/Drains/Airways     Name Placement date Placement time Site Days   Peripheral IV 07/24/22 20 G 1.88" Anterior;Right Forearm 07/24/22  1722  Forearm  less than 1   Incision (Closed) 07/18/22 Back Other (Comment) 07/18/22  1357  -- 6            Intake/Output Last 24 hours No intake or output data in the 24 hours ending 07/24/22 2353  Labs/Imaging Results for orders placed or performed during the hospital encounter of 07/24/22 (from the past 48 hour(s))  Lipase, blood     Status: None   Collection Time: 07/24/22  2:53 PM  Result Value Ref Range   Lipase 28 11 - 51 U/L    Comment: Performed at [facility], [place] [address], [place], [place] [address]  Comprehensive metabolic panel     Status: Abnormal   Collection Time: 07/24/22  2:53 PM  Result Value Ref Range   Sodium 134 (L) 135 - 145 mmol/L   Potassium 3.4 (L) 3.5 - 5.1 mmol/L   Chloride 99 98 -  111 mmol/L   CO2 24 22 - 32 mmol/L   Glucose, Bld 182 (H) 70 - 99 mg/dL    Comment: Glucose reference range applies only to samples taken after fasting for at least 8 hours.   BUN 16 8 - 23 mg/dL   Creatinine, Ser 1.18 (H) 0.44 - 1.00 mg/dL   Calcium 9.1 8.9 - 10.3 mg/dL   Total Protein 6.6 6.5 - 8.1 g/dL   Albumin 3.0 (L) 3.5 - 5.0 g/dL   AST 23 15 - 41 U/L   ALT 12 0 - 44 U/L   Alkaline Phosphatase 82 38 - 126 U/L   Total Bilirubin 0.9 0.3 - 1.2 mg/dL   GFR, Estimated 48 (L) >60 mL/min    Comment: (NOTE) Calculated using the CKD-EPI Creatinine Equation (2021)    Anion gap 11 5 - 15    Comment: Performed at [facility] [address], [place], [place] [address]  CBC     Status: Abnormal   Collection Time: 07/24/22  2:53 PM   Result Value Ref Range   WBC 11.5 (H) 4.0 - 10.5 K/uL   RBC 4.07 3.87 - 5.11 MIL/uL   Hemoglobin 12.0 12.0 - 15.0 g/dL   HCT 37.6 36.0 - 46.0 %   MCV 92.4 80.0 - 100.0 fL   MCH 29.5 26.0 - 34.0 pg   MCHC 31.9 30.0 - 36.0 g/dL   RDW 13.7 11.5 - 15.5 %   Platelets 458 (H) 150 - 400 K/uL   nRBC 0.0 0.0 - 0.2 %    Comment: Performed at [facility], [place] [address] [place], [place] [address]  CBG monitoring, ED     Status: Abnormal   Collection Time: 07/24/22 10:02 PM  Result Value Ref Range   Glucose-Capillary 195 (H) 70 - 99 mg/dL    Comment: Glucose reference range applies only to samples taken after fasting for at least 8 hours.   CT ABDOMEN PELVIS W CONTRAST  Result Date: 07/24/2022 CLINICAL DATA:  Recent back surgery 07/18/2022, constipation EXAM: CT ABDOMEN AND PELVIS WITH CONTRAST TECHNIQUE: Multidetector CT imaging of the abdomen and pelvis was performed using the standard protocol following bolus administration of intravenous contrast. RADIATION DOSE REDUCTION: This exam was performed according to the departmental dose-optimization program which includes automated exposure control, adjustment of the mA and/or kV according to patient size and/or use of iterative reconstruction technique. CONTRAST:  51m OMNIPAQUE IOHEXOL 300 MG/ML  SOLN COMPARISON:  01/26/2021 FINDINGS: Lower chest: Bandlike dependent bibasilar scarring or atelectasis. Hepatobiliary: No focal liver abnormality is seen. Status post cholecystectomy. No biliary dilatation. Pancreas: Unremarkable. No pancreatic ductal dilatation or surrounding inflammatory changes. Spleen: Normal in size without significant abnormality. Adrenals/Urinary Tract: Adrenal glands are unremarkable. Kidneys are normal, without renal calculi, solid lesion, or hydronephrosis. Bladder is unremarkable. Stomach/Bowel: Stomach is within normal limits. Appendix not clearly visualized and may be surgically absent. Small bowel is nondistended. The colon  is diffusely stool, fluid, and air-filled, mildly distended, largest loops of cecum measuring up to 8.1 cm. Mild adjacent inflammatory fat stranding and trace fluid in the paracolic gutters. Vascular/Lymphatic: Aortic atherosclerosis. No enlarged abdominal or pelvic lymph nodes. Reproductive: Status post hysterectomy. Other: No abdominal wall hernia.  Anasarca. Musculoskeletal: Status post posterior laminectomy and interbody fusion of L4-L5 with an overlying air and fluid collection measuring approximately 5.3 x 2.9 cm (series 3, image 47). IMPRESSION: 1. The colon is diffusely stool, fluid, and air-filled, mildly distended, largest loops of cecum  measuring up to 8.1 cm. Gas and stool present to the rectum. Mild adjacent inflammatory fat stranding and trace fluid in the paracolic gutters. Small bowel is nondistended. Findings suggest postoperative colonic ileus or alternately nonspecific infectious or inflammatory colitis, potentially including C diff colitis. 2. Status post posterior laminectomy and interbody fusion of L4-L5 with an overlying air and fluid collection in the subcutaneous soft tissue measuring approximately 5.3 x 2.9 cm. This most likely reflects postoperative hematoma or seroma in the recent postoperative setting, however the presence or absence of infection in this fluid is not established by CT. Aortic Atherosclerosis (ICD10-I70.0). Electronically Signed   By: Delanna Ahmadi M.D.   On: 07/24/2022 18:22    Pending Labs Unresulted Labs (From admission, onward)     Start     Ordered   07/25/22 0500  Comprehensive metabolic panel  Tomorrow morning,   R        07/24/22 2142   07/25/22 0500  CBC  Tomorrow morning,   R        07/24/22 2142   07/24/22 2142  CBC  (heparin)  Once,   R       Comments: Baseline for heparin therapy IF NOT ALREADY DRAWN.  Notify MD if PLT < 100 K.    07/24/22 2142   07/24/22 2142  Creatinine, serum  (heparin)  Once,   R       Comments: Baseline for heparin therapy  IF NOT ALREADY DRAWN.    07/24/22 2142   07/24/22 1839  Magnesium  Add-on,   AD        07/24/22 1838   07/24/22 1453  Urinalysis, Routine w reflex microscopic  Once,   URGENT        07/24/22 1452            Vitals/Pain Today's Vitals   07/24/22 2102 07/24/22 2230 07/24/22 2245 07/24/22 2345  BP: (!) 155/90 133/83 122/85 (!) 148/74  Pulse: (!) 120 (!) 113 (!) 114 (!) 114  Resp: 20 (!) 21 15 (!) 21  Temp: 98 F (36.7 C)     TempSrc: Oral     SpO2: 94% 91% 93% 91%  Weight:      Height:      PainSc: 6        Isolation Precautions No active isolations  Medications Medications  insulin aspart (novoLOG) injection 0-9 Units (2 Units Subcutaneous Given 07/24/22 2216)  heparin injection 5,000 Units (5,000 Units Subcutaneous Given 07/24/22 2216)  lactated ringers infusion ( Intravenous New Bag/Given 07/24/22 2216)  acetaminophen (TYLENOL) tablet 650 mg (has no administration in time range)    Or  acetaminophen (TYLENOL) suppository 650 mg (has no administration in time range)  labetalol (NORMODYNE) injection 10 mg (has no administration in time range)  lactated ringers bolus 1,000 mL (0 mLs Intravenous Stopped 07/24/22 2232)  morphine (PF) 4 MG/ML injection 4 mg (4 mg Intravenous Given 07/24/22 1729)  ondansetron (ZOFRAN) injection 4 mg (4 mg Intravenous Given 07/24/22 1729)  iohexol (OMNIPAQUE) 300 MG/ML solution 75 mL (75 mLs Intravenous Contrast Given 07/24/22 1814)  potassium chloride (KLOR-CON) packet 40 mEq (40 mEq Oral Given 07/24/22 1903)  sorbitol, milk of mag, mineral oil, glycerin (SMOG) enema (960 mLs Rectal Given 07/24/22 2035)  morphine (PF) 4 MG/ML injection 4 mg (4 mg Intravenous Given 07/24/22 1930)  ondansetron (ZOFRAN) injection 4 mg (4 mg Intravenous Given 07/24/22 2137)    Mobility walks with person assist High fall risk   Focused Assessments  Neuro Assessment Handoff:  Swallow screen pass? Yes  Cardiac Rhythm: Sinus tachycardia       Neuro Assessment: Within Defined  Limits Neuro Checks:      Last Documented NIHSS Modified Score:   Has TPA been given? No If patient is a Neuro Trauma and patient is going to OR before floor call report to [place] nurse: [phone] or [phone]   R Recommendations: See Admitting Provider Note  Report given to:   Additional Notes: pt is AAOx4. Pt is on RA. Pt is ambulatory with assistance. Pt is a fall risk.

## 2022-07-25 ENCOUNTER — Encounter (HOSPITAL_COMMUNITY): Payer: Self-pay | Admitting: Internal Medicine

## 2022-07-25 DIAGNOSIS — I130 Hypertensive heart and chronic kidney disease with heart failure and stage 1 through stage 4 chronic kidney disease, or unspecified chronic kidney disease: Secondary | ICD-10-CM | POA: Diagnosis present

## 2022-07-25 DIAGNOSIS — M797 Fibromyalgia: Secondary | ICD-10-CM | POA: Diagnosis not present

## 2022-07-25 DIAGNOSIS — K9189 Other postprocedural complications and disorders of digestive system: Secondary | ICD-10-CM | POA: Diagnosis present

## 2022-07-25 DIAGNOSIS — R0609 Other forms of dyspnea: Secondary | ICD-10-CM | POA: Diagnosis not present

## 2022-07-25 DIAGNOSIS — F419 Anxiety disorder, unspecified: Secondary | ICD-10-CM

## 2022-07-25 DIAGNOSIS — R0602 Shortness of breath: Secondary | ICD-10-CM | POA: Diagnosis not present

## 2022-07-25 DIAGNOSIS — N183 Chronic kidney disease, stage 3 unspecified: Secondary | ICD-10-CM | POA: Diagnosis not present

## 2022-07-25 DIAGNOSIS — E1159 Type 2 diabetes mellitus with other circulatory complications: Secondary | ICD-10-CM | POA: Diagnosis not present

## 2022-07-25 DIAGNOSIS — K55039 Acute (reversible) ischemia of large intestine, extent unspecified: Secondary | ICD-10-CM | POA: Diagnosis not present

## 2022-07-25 DIAGNOSIS — Z9889 Other specified postprocedural states: Secondary | ICD-10-CM | POA: Diagnosis not present

## 2022-07-25 DIAGNOSIS — D123 Benign neoplasm of transverse colon: Secondary | ICD-10-CM | POA: Diagnosis present

## 2022-07-25 DIAGNOSIS — J4541 Moderate persistent asthma with (acute) exacerbation: Secondary | ICD-10-CM | POA: Diagnosis present

## 2022-07-25 DIAGNOSIS — G4733 Obstructive sleep apnea (adult) (pediatric): Secondary | ICD-10-CM | POA: Diagnosis present

## 2022-07-25 DIAGNOSIS — Z981 Arthrodesis status: Secondary | ICD-10-CM | POA: Diagnosis not present

## 2022-07-25 DIAGNOSIS — R0600 Dyspnea, unspecified: Secondary | ICD-10-CM | POA: Diagnosis not present

## 2022-07-25 DIAGNOSIS — E785 Hyperlipidemia, unspecified: Secondary | ICD-10-CM | POA: Diagnosis present

## 2022-07-25 DIAGNOSIS — I129 Hypertensive chronic kidney disease with stage 1 through stage 4 chronic kidney disease, or unspecified chronic kidney disease: Secondary | ICD-10-CM | POA: Diagnosis not present

## 2022-07-25 DIAGNOSIS — Z6839 Body mass index (BMI) 39.0-39.9, adult: Secondary | ICD-10-CM | POA: Diagnosis not present

## 2022-07-25 DIAGNOSIS — I4891 Unspecified atrial fibrillation: Secondary | ICD-10-CM | POA: Diagnosis present

## 2022-07-25 DIAGNOSIS — K219 Gastro-esophageal reflux disease without esophagitis: Secondary | ICD-10-CM | POA: Diagnosis present

## 2022-07-25 DIAGNOSIS — R109 Unspecified abdominal pain: Secondary | ICD-10-CM | POA: Diagnosis present

## 2022-07-25 DIAGNOSIS — E669 Obesity, unspecified: Secondary | ICD-10-CM | POA: Diagnosis present

## 2022-07-25 DIAGNOSIS — K567 Ileus, unspecified: Secondary | ICD-10-CM | POA: Diagnosis present

## 2022-07-25 DIAGNOSIS — F32A Depression, unspecified: Secondary | ICD-10-CM | POA: Diagnosis present

## 2022-07-25 DIAGNOSIS — J90 Pleural effusion, not elsewhere classified: Secondary | ICD-10-CM | POA: Diagnosis not present

## 2022-07-25 DIAGNOSIS — R7989 Other specified abnormal findings of blood chemistry: Secondary | ICD-10-CM | POA: Diagnosis not present

## 2022-07-25 DIAGNOSIS — E1165 Type 2 diabetes mellitus with hyperglycemia: Secondary | ICD-10-CM | POA: Diagnosis present

## 2022-07-25 DIAGNOSIS — N1831 Chronic kidney disease, stage 3a: Secondary | ICD-10-CM | POA: Diagnosis present

## 2022-07-25 DIAGNOSIS — I152 Hypertension secondary to endocrine disorders: Secondary | ICD-10-CM

## 2022-07-25 DIAGNOSIS — Z4789 Encounter for other orthopedic aftercare: Secondary | ICD-10-CM | POA: Diagnosis not present

## 2022-07-25 DIAGNOSIS — K6389 Other specified diseases of intestine: Secondary | ICD-10-CM | POA: Diagnosis not present

## 2022-07-25 DIAGNOSIS — K5900 Constipation, unspecified: Secondary | ICD-10-CM | POA: Diagnosis not present

## 2022-07-25 DIAGNOSIS — M7989 Other specified soft tissue disorders: Secondary | ICD-10-CM | POA: Diagnosis not present

## 2022-07-25 DIAGNOSIS — K641 Second degree hemorrhoids: Secondary | ICD-10-CM | POA: Diagnosis present

## 2022-07-25 DIAGNOSIS — R062 Wheezing: Secondary | ICD-10-CM | POA: Diagnosis not present

## 2022-07-25 DIAGNOSIS — E876 Hypokalemia: Secondary | ICD-10-CM | POA: Diagnosis present

## 2022-07-25 DIAGNOSIS — E1122 Type 2 diabetes mellitus with diabetic chronic kidney disease: Secondary | ICD-10-CM | POA: Diagnosis present

## 2022-07-25 DIAGNOSIS — I5031 Acute diastolic (congestive) heart failure: Secondary | ICD-10-CM | POA: Diagnosis present

## 2022-07-25 DIAGNOSIS — K559 Vascular disorder of intestine, unspecified: Secondary | ICD-10-CM | POA: Diagnosis present

## 2022-07-25 DIAGNOSIS — D649 Anemia, unspecified: Secondary | ICD-10-CM | POA: Diagnosis not present

## 2022-07-25 DIAGNOSIS — E1169 Type 2 diabetes mellitus with other specified complication: Secondary | ICD-10-CM | POA: Diagnosis present

## 2022-07-25 DIAGNOSIS — K5730 Diverticulosis of large intestine without perforation or abscess without bleeding: Secondary | ICD-10-CM | POA: Diagnosis present

## 2022-07-25 DIAGNOSIS — J449 Chronic obstructive pulmonary disease, unspecified: Secondary | ICD-10-CM | POA: Diagnosis present

## 2022-07-25 DIAGNOSIS — Z7984 Long term (current) use of oral hypoglycemic drugs: Secondary | ICD-10-CM | POA: Diagnosis not present

## 2022-07-25 DIAGNOSIS — K5939 Other megacolon: Secondary | ICD-10-CM | POA: Diagnosis not present

## 2022-07-25 DIAGNOSIS — J9811 Atelectasis: Secondary | ICD-10-CM | POA: Diagnosis not present

## 2022-07-25 DIAGNOSIS — N189 Chronic kidney disease, unspecified: Secondary | ICD-10-CM | POA: Diagnosis not present

## 2022-07-25 DIAGNOSIS — K635 Polyp of colon: Secondary | ICD-10-CM | POA: Diagnosis not present

## 2022-07-25 LAB — GLUCOSE, CAPILLARY
Glucose-Capillary: 141 mg/dL — ABNORMAL HIGH (ref 70–99)
Glucose-Capillary: 153 mg/dL — ABNORMAL HIGH (ref 70–99)
Glucose-Capillary: 161 mg/dL — ABNORMAL HIGH (ref 70–99)
Glucose-Capillary: 129 mg/dL — ABNORMAL HIGH (ref 70–99)
Glucose-Capillary: 135 mg/dL — ABNORMAL HIGH (ref 70–99)
Glucose-Capillary: 123 mg/dL — ABNORMAL HIGH (ref 70–99)

## 2022-07-25 LAB — CBC
HCT: 31.8 % — ABNORMAL LOW (ref 36.0–46.0)
Hemoglobin: 10.3 g/dL — ABNORMAL LOW (ref 12.0–15.0)
MCH: 29.8 pg (ref 26.0–34.0)
MCHC: 32.4 g/dL (ref 30.0–36.0)
MCV: 91.9 fL (ref 80.0–100.0)
Platelets: 388 10*3/uL (ref 150–400)
RBC: 3.46 MIL/uL — ABNORMAL LOW (ref 3.87–5.11)
RDW: 13.9 % (ref 11.5–15.5)
WBC: 13.6 10*3/uL — ABNORMAL HIGH (ref 4.0–10.5)
nRBC: 0 % (ref 0.0–0.2)

## 2022-07-25 LAB — COMPREHENSIVE METABOLIC PANEL
ALT: 11 U/L (ref 0–44)
AST: 19 U/L (ref 15–41)
Albumin: 2.5 g/dL — ABNORMAL LOW (ref 3.5–5.0)
Alkaline Phosphatase: 63 U/L (ref 38–126)
Anion gap: 9 (ref 5–15)
BUN: 15 mg/dL (ref 8–23)
CO2: 25 mmol/L (ref 22–32)
Calcium: 8.6 mg/dL — ABNORMAL LOW (ref 8.9–10.3)
Chloride: 103 mmol/L (ref 98–111)
Creatinine, Ser: 1.17 mg/dL — ABNORMAL HIGH (ref 0.44–1.00)
GFR, Estimated: 48 mL/min — ABNORMAL LOW (ref >60)
Glucose, Bld: 147 mg/dL — ABNORMAL HIGH (ref 70–99)
Potassium: 4.3 mmol/L (ref 3.5–5.1)
Sodium: 137 mmol/L (ref 135–145)
Total Bilirubin: 0.7 mg/dL (ref 0.3–1.2)
Total Protein: 5.5 g/dL — ABNORMAL LOW (ref 6.5–8.1)

## 2022-07-25 LAB — LACTIC ACID, PLASMA
Lactic Acid, Venous: 0.9 mmol/L (ref 0.5–1.9)
Lactic Acid, Venous: 0.9 mmol/L (ref 0.5–1.9)

## 2022-07-25 LAB — TYPE AND SCREEN
ABO/RH(D): O POS
Antibody Screen: NEGATIVE

## 2022-07-25 LAB — C DIFFICILE QUICK SCREEN W PCR REFLEX
C Diff antigen: NEGATIVE
C Diff interpretation: NOT DETECTED
C Diff toxin: NEGATIVE

## 2022-07-25 LAB — MAGNESIUM: Magnesium: 2.3 mg/dL (ref 1.7–2.4)

## 2022-07-25 MED ORDER — ONDANSETRON HCL 4 MG/2ML IJ SOLN
4.0000 mg | Freq: Four times a day (QID) | INTRAMUSCULAR | Status: DC | PRN
  Administered 2022-07-25: 4 mg via INTRAVENOUS
  Filled 2022-07-25: qty 2

## 2022-07-25 MED ORDER — METRONIDAZOLE 500 MG/100ML IV SOLN
500.0000 mg | Freq: Two times a day (BID) | INTRAVENOUS | Status: DC
  Administered 2022-07-25 – 2022-07-30 (×11): 500 mg via INTRAVENOUS
  Filled 2022-07-25 (×11): qty 100

## 2022-07-25 MED ORDER — PANTOPRAZOLE SODIUM 40 MG IV SOLR
40.0000 mg | INTRAVENOUS | Status: DC
Start: 2022-07-25 — End: 2022-07-27
  Administered 2022-07-25 – 2022-07-27 (×3): 40 mg via INTRAVENOUS
  Filled 2022-07-25 (×3): qty 10

## 2022-07-25 MED ORDER — MORPHINE SULFATE (PF) 2 MG/ML IV SOLN
2.0000 mg | INTRAVENOUS | Status: DC | PRN
  Administered 2022-07-25 (×3): 2 mg via INTRAVENOUS
  Administered 2022-07-26: 4 mg via INTRAVENOUS
  Administered 2022-07-26: 2 mg via INTRAVENOUS
  Administered 2022-07-26: 4 mg via INTRAVENOUS
  Administered 2022-07-26: 2 mg via INTRAVENOUS
  Administered 2022-07-26 – 2022-07-27 (×2): 4 mg via INTRAVENOUS
  Filled 2022-07-25: qty 1
  Filled 2022-07-25: qty 2
  Filled 2022-07-25 (×2): qty 1
  Filled 2022-07-25: qty 2
  Filled 2022-07-25: qty 1
  Filled 2022-07-25: qty 2
  Filled 2022-07-25: qty 1
  Filled 2022-07-25: qty 2

## 2022-07-25 MED ORDER — CIPROFLOXACIN IN D5W 400 MG/200ML IV SOLN
400.0000 mg | Freq: Two times a day (BID) | INTRAVENOUS | Status: DC
  Administered 2022-07-25 – 2022-07-30 (×11): 400 mg via INTRAVENOUS
  Filled 2022-07-25 (×12): qty 200

## 2022-07-25 MED ORDER — POTASSIUM CHLORIDE 10 MEQ/100ML IV SOLN
10.0000 meq | INTRAVENOUS | Status: AC
  Administered 2022-07-25 (×2): 10 meq via INTRAVENOUS
  Filled 2022-07-25 (×2): qty 100

## 2022-07-25 MED ORDER — FENTANYL CITRATE PF 50 MCG/ML IJ SOSY
25.0000 ug | PREFILLED_SYRINGE | Freq: Four times a day (QID) | INTRAMUSCULAR | Status: DC | PRN
  Administered 2022-07-25: 25 ug via INTRAVENOUS
  Filled 2022-07-25 (×2): qty 1

## 2022-07-25 MED ORDER — BISACODYL 10 MG RE SUPP
10.0000 mg | Freq: Four times a day (QID) | RECTAL | Status: AC
  Administered 2022-07-25: 10 mg via RECTAL
  Filled 2022-07-25 (×2): qty 1

## 2022-07-25 NOTE — Progress Notes (Signed)
Pharmacy Antibiotic Note  CHALESE Mcdonald is a 76 y.o. female admitted on 07/24/2022 with  concern for intra-abdominal infection .  Pharmacy has been consulted for ciprofloxacin dosing.  Plan: Ciprofloxacin '400mg'$  IV Q12H. Also started on metronidazole per MD.  Height: '4\' 11"'$  (149.9 cm) Weight: 82.6 kg (182 lb) IBW/kg (Calculated) : 43.2  Temp (24hrs), Avg:98.1 F (36.7 C), Min:97.8 F (36.6 C), Max:99 F (37.2 C)  Recent Labs  Lab 07/19/22 0741 07/20/22 0657 07/24/22 1453  WBC 14.6*  --  11.5*  CREATININE 1.72* 2.03* 1.18*    Estimated Creatinine Clearance: 37.8 mL/min (A) (by C-G formula based on SCr of 1.18 mg/dL (H)).    Allergies  Allergen Reactions   Elemental Sulfur Rash   Sulfa Antibiotics Rash   Tricor [Fenofibrate] Rash    Thank you for allowing pharmacy to be a part of this patient's care.  Wynona Neat, PharmD, BCPS  07/25/2022 4:30 AM

## 2022-07-25 NOTE — Progress Notes (Signed)
PROGRESS NOTE    NIKITA SURMAN  SHF:[id number] DOB: 12-09-1946 DOA: 07/24/2022 PCP: [name], MD   Brief Narrative: ROSALITA CAREY is a 76 y.o. female with a history of diabetes mellitus type 2, fibromyalgia, GERD, sleep apnea, recent L4-L5 decompression surgery, depression, anxiety. Patient presented secondary to abdominal pain with bowel movements with concern for ileus vs inflammatory colitis seen on CT imaging. GI consulted. Empiric antibiotics initiated.   Assessment and Plan:  Abdominal pain Concern for postoperative colonic ileus vs inflammatory colitis. History of blood per rectum. Patient has been on opiates for her post-operative pain management. GI consulted. Patient made NPO. -Follow-up GI recommendations -Continue NPO -Morphine PRN, minimize as able -Continue Ciprofloxacin and Flagyl empirically  History of L4-L5 decompression Recent surgery. Neurosurgery alerted while patient was in the ED. -Follow-up neurosurgery recommendations  Diabetes mellitus, type 2 Patient is on metformin as an outpatient, which was held. -Continue SSI  Primary hypertension Patient is on olmesartan and Maxide as an outpatient which were held secondary to NPO status -Resume antihypertensives once able to take PO  Anxiety Depression Patient is on Xanax and Effexor as an outpatient which were held while patient is NPO.  Hyperlipidemia Pravastatin held on admission   DVT prophylaxis: SCDs Code Status:   Code Status: Full Code Family Communication: Daughter and sister at bedside Disposition Plan: Discharge home likely in 2-3 days pending improvement of ileus, GI evaluation and recommendations and advancement of diet   Consultants:  [facility]  Procedures:  None  Antimicrobials: Ciprofloxacin Flagyl    Subjective: Patient with continued abdominal pain. She reports liquid bowel movements.  Objective: BP (!) 147/83 (BP Location: Right Arm)   Pulse  (!) 114   Temp 98.2 F (36.8 C) (Oral)   Resp 16   Ht '4\' 11"'$  (1.499 m)   Wt 82.6 kg   SpO2 95%   BMI 36.76 kg/m   Examination:  General exam: Appears calm and comfortable Respiratory system: Clear to auscultation. Respiratory effort normal. Cardiovascular system: S1 & S2 heard, RRR. No murmurs, rubs, gallops or clicks. Gastrointestinal system: Abdomen is slightly distended, soft and diffusely mildly tender. Slightly hyperactive bowel sounds heard. Central nervous system: Alert and oriented. No focal neurological deficits. Musculoskeletal: No edema. No calf tenderness Skin: No cyanosis. No rashes Psychiatry: Judgement and insight appear normal. Mood & affect appropriate.    Data Reviewed: I have personally reviewed following labs and imaging studies  CBC Lab Results  Component Value Date   WBC 13.6 (H) 07/25/2022   RBC 3.46 (L) 07/25/2022   HGB 10.3 (L) 07/25/2022   HCT 31.8 (L) 07/25/2022   MCV 91.9 07/25/2022   MCH 29.8 07/25/2022   PLT 388 07/25/2022   MCHC 32.4 07/25/2022   RDW 13.9 07/25/2022   LYMPHSABS 0.4 (L) 03/17/2017   MONOABS 0.6 03/17/2017   EOSABS 0.1 03/17/2017   BASOSABS 0.0 50/27/7412     Last metabolic panel Lab Results  Component Value Date   NA 137 07/25/2022   K 4.3 07/25/2022   CL 103 07/25/2022   CO2 25 07/25/2022   BUN 15 07/25/2022   CREATININE 1.17 (H) 07/25/2022   GLUCOSE 147 (H) 07/25/2022   GFRNONAA 48 (L) 07/25/2022   GFRAA 44 (L) 09/07/2020   CALCIUM 8.6 (L) 07/25/2022   PROT 5.5 (L) 07/25/2022   ALBUMIN 2.5 (L) 07/25/2022   BILITOT 0.7 07/25/2022   ALKPHOS 63 07/25/2022   AST 19 07/25/2022   ALT 11 07/25/2022  ANIONGAP 9 07/25/2022    GFR: Estimated Creatinine Clearance: 38.1 mL/min (A) (by C-G formula based on SCr of 1.17 mg/dL (H)).  Recent Results (from the past 240 hour(s))  C Difficile Quick Screen w PCR reflex     Status: None   Collection Time: 07/25/22  1:56 AM   Specimen: STOOL  Result Value Ref Range  Status   C Diff antigen NEGATIVE NEGATIVE Final   C Diff toxin NEGATIVE NEGATIVE Final   C Diff interpretation No C. difficile detected.  Final    Comment: Performed at [facility], [place] [address] [place], [place] [address]      Radiology Studies: DG Pelvis Portable  Result Date: 07/24/2022 CLINICAL DATA:  Abdominal distension.  Recent lumbar decompression. EXAM: PORTABLE PELVIS 1-2 VIEWS COMPARISON:  None Available. FINDINGS: Postoperative changes in the lower lumbar spine. Mildly prominent bowel loops are partially visualized in the lower abdomen and upper pelvis. Contrast material seen within the renal collecting systems and bladder from earlier CT. No acute bony abnormality. IMPRESSION: Partially imaged dilated bowel loops in the lower abdomen. CT earlier abdominal CT for further discussion. Changes of lower lumbar fusion. No visible hardware complicating feature on this single AP image. Electronically Signed   By: Rolm Baptise M.D.   On: 07/24/2022 22:34   CT ABDOMEN PELVIS W CONTRAST  Result Date: 07/24/2022 CLINICAL DATA:  Recent back surgery 07/18/2022, constipation EXAM: CT ABDOMEN AND PELVIS WITH CONTRAST TECHNIQUE: Multidetector CT imaging of the abdomen and pelvis was performed using the standard protocol following bolus administration of intravenous contrast. RADIATION DOSE REDUCTION: This exam was performed according to the departmental dose-optimization program which includes automated exposure control, adjustment of the mA and/or kV according to patient size and/or use of iterative reconstruction technique. CONTRAST:  49m OMNIPAQUE IOHEXOL 300 MG/ML  SOLN COMPARISON:  01/26/2021 FINDINGS: Lower chest: Bandlike dependent bibasilar scarring or atelectasis. Hepatobiliary: No focal liver abnormality is seen. Status post cholecystectomy. No biliary dilatation. Pancreas: Unremarkable. No pancreatic ductal dilatation or surrounding inflammatory changes. Spleen: Normal in size without  significant abnormality. Adrenals/Urinary Tract: Adrenal glands are unremarkable. Kidneys are normal, without renal calculi, solid lesion, or hydronephrosis. Bladder is unremarkable. Stomach/Bowel: Stomach is within normal limits. Appendix not clearly visualized and may be surgically absent. Small bowel is nondistended. The colon is diffusely stool, fluid, and air-filled, mildly distended, largest loops of cecum measuring up to 8.1 cm. Mild adjacent inflammatory fat stranding and trace fluid in the paracolic gutters. Vascular/Lymphatic: Aortic atherosclerosis. No enlarged abdominal or pelvic lymph nodes. Reproductive: Status post hysterectomy. Other: No abdominal wall hernia.  Anasarca. Musculoskeletal: Status post posterior laminectomy and interbody fusion of L4-L5 with an overlying air and fluid collection measuring approximately 5.3 x 2.9 cm (series 3, image 47). IMPRESSION: 1. The colon is diffusely stool, fluid, and air-filled, mildly distended, largest loops of cecum measuring up to 8.1 cm. Gas and stool present to the rectum. Mild adjacent inflammatory fat stranding and trace fluid in the paracolic gutters. Small bowel is nondistended. Findings suggest postoperative colonic ileus or alternately nonspecific infectious or inflammatory colitis, potentially including C diff colitis. 2. Status post posterior laminectomy and interbody fusion of L4-L5 with an overlying air and fluid collection in the subcutaneous soft tissue measuring approximately 5.3 x 2.9 cm. This most likely reflects postoperative hematoma or seroma in the recent postoperative setting, however the presence or absence of infection in this fluid is not established by CT. Aortic Atherosclerosis (ICD10-I70.0). Electronically Signed   By:  Delanna Ahmadi M.D.   On: 07/24/2022 18:22      LOS: 0 days    Cordelia Poche, MD [facility] 07/25/2022, 9:20 AM   If 7PM-7AM, please contact night-coverage [email or web address]

## 2022-07-25 NOTE — Progress Notes (Signed)
Mobility Specialist Progress Note:   07/25/22 1121  Mobility  Activity Ambulated with assistance in hallway  Level of Assistance Moderate assist, patient does 50-74%  Assistive Device Front wheel walker  Distance Ambulated (ft) 150 ft  Activity Response Tolerated fair  $Mobility charge 1 Mobility   Pt in bed and agreeable. C/o of 10/10 pain, limiting distance. Pt in bed with all needs met and call bell in reach.     Mobility Specialist-Acute Rehab Secure Chat only  

## 2022-07-25 NOTE — Consult Note (Signed)
[facility] Consult: 2:54 PM 07/25/2022  LOS: 0 days    Referring Provider: Dr Georgena Spurling  Primary Care Physician:  [name], MD Primary Gastroenterologist:  Dr. Keith Rake in [place].       Reason for Consultation:  post op ileus.     HPI: Sara Mcdonald is a 76 y.o. female.  PMH NIDDM.   CKD 3.  Fibromyalgia.  OSA. Chronic constipation.  GERD.  Surgeries include appendectomy, open cholecystectomy, total vaginal hysterectomy. 06/2016 colonoscopy at [facility].  Surveillance study for personal history colon polyps.  A total of 6 diminutive polyps removed from the ascending and descending colon.  Descending diverticulosis.  Nonbleeding internal hemorrhoids.  Polyp pathology: TAs and HP polyps, no HGD.  Remote EGD.  Lumbar spondylolisthesis, facet arthropathy, radiculopathy, neurogenic claudication. 07/20/2022 L4/L5 spinal decompression.  Patient did not meet criteria for inpatient rehab as she was "mobilizing too well".  Discharged that same day w prescription for oxycodone 5/325 and MiraLAX as needed.  She was to continue her usual Protonix 40 mg bid.    Presented back to the ED on 8/6 for eval abdominal discomfort, nausea, nonbloody vomiting and lack of bowel movement since discharge.  She had been taking oxycodone every 4 hours, equivalent to 6 or 7/day since discharge.  She describes longstanding chronic constipation treats with MiraLAX which is not always successful in resolving her constipation.  PCP had recommended as needed Mylanta for the constipation and this was previously effective after taking almost the whole bottle.  Patient and her daughter relay that patient's blood pressure before surgery was 80s/30s and she was also hypotensive following the surgery.  Review of anesthesiologist Intra-Op notes  document MAP's anywhere from 73-106 during the surgery.  These PEs since arrival to ED yesterday are soft but not significantly hypotensive.  Lowest reading 122/85, heart rate consistently tacky in the 1 teens up to 120.  No fevers.  07/24/22 CTAP w contrast:  Colon diffusely filled with stool, fluid and air, mild colonic distention with cecum up to 8.1 cm.  Some inflammatory fat stranding and trace fluid in the paracolic gutters.  No small bowel distention.  Features consistent with postop ileus though cannot rule out nonspecific thick infectious or inflammatory colitis.  Changes of lumbar laminectomy/fluid, a 5.3 x 2.9 cm fluid collection probably reflects hematoma or seroma.  Hgb 10.3, was 10.7 a year ago and 10.8 a week ago.  MCV 91. WBCs 13.6, 14.6 a week ago. Platelets normal Na normal.  It was as low as 131 5 days ago. Potassium 3.4.Marland Kitchen 2 runs potassium.. 4.3.  Magnesium normal.  No phosphate reading BUN normal, creatinine 1.1.  Overall improved from 30/2.05 days ago. LFTs normal though albumin low at 2.5. C. difficile negative.  A smog enema was administered at 10:30 PM yesterday.  This resulted in a large volume of semisoft brown stool, watery material.  A second stool within a few hours resulted in mostly just watery effluent but staff noted there was some blood in it.  Her abdominal distention has improved.  She is no longer vomiting but still feels queasy.  Abdominal pain is now focused in the lower abdomen bilaterally and is somewhat better than at arrival. Cipro, Flagyl initiated this morning. 2 runs of potassium administered early this morning.  Family history of her younger sister, 2 years younger than the patient, has had colostomy for unknown clear reasons.  Patient does not know if this was because of diverticular disease, IBD, ischemic bowel disease.  Her maternal aunt had colon polyps. Widow.  Pt lives in [place].  No alcohol, illicit drugs or tobacco use.      Past Medical History:  Diagnosis Date   Anxiety    Chronic kidney disease    CKD3   Depression    Diabetes mellitus without complication (HCC)    Dyspnea    with episodes of pleurisy   Elevated lipids    Fibromyalgia    GERD (gastroesophageal reflux disease)    Headache    Hypertension    OA (osteoarthritis)    Pneumonia    has had several times. last time 2019   Sleep apnea     Past Surgical History:  Procedure Laterality Date   APPENDECTOMY     CHOLECYSTECTOMY     COLONOSCOPY     COLONOSCOPY WITH PROPOFOL N/A 07/07/2016   Procedure: COLONOSCOPY WITH PROPOFOL;  Surgeon: Manya Silvas, MD;  Location: [facility];  Service: Endoscopy;  Laterality: N/A;   ESOPHAGOGASTRODUODENOSCOPY     EYE SURGERY  2013   bilateral cataract   FLEXIBLE SIGMOIDOSCOPY     nanoflex Bilateral    TOTAL VAGINAL HYSTERECTOMY  1976    Prior to Admission medications   Medication Sig Start Date End Date Taking? Authorizing Provider  ALPRAZolam Duanne Moron) 0.5 MG tablet Take 0.5 mg by mouth 2 (two) times daily as needed for anxiety or sleep.  01/11/17   [provider]  cholecalciferol (VITAMIN D3) 25 MCG (1000 UNIT) tablet Take 1,000 Units by mouth daily.    [provider]  cyclobenzaprine (FLEXERIL) 5 MG tablet Take 1 tablet (5 mg total) by mouth 3 (three) times daily as needed for muscle spasms. 07/20/22   Newman Pies, MD  docusate sodium (COLACE) 100 MG capsule Take 1 capsule (100 mg total) by mouth 2 (two) times daily. 07/20/22   Newman Pies, MD  metFORMIN (GLUCOPHAGE) 500 MG tablet Take 500 mg by mouth daily.     [provider]  Multiple Vitamin (MULTIVITAMIN WITH MINERALS) TABS tablet Take 1 tablet by mouth daily.    [provider]  nystatin-triamcinolone (MYCOLOG II) cream Apply 1 application  topically daily as needed (rash under breast). 07/26/18   [provider]  olmesartan (BENICAR) 20 MG tablet Take 20 mg by mouth daily. 07/26/20    [provider]  oxyCODONE-acetaminophen (PERCOCET/ROXICET) 5-325 MG tablet Take 1-2 tablets by mouth every 4 (four) hours as needed for moderate pain. 07/20/22   Newman Pies, MD  pantoprazole (PROTONIX) 40 MG tablet Take 40 mg by mouth 2 (two) times daily.     [provider]  polyethylene glycol powder (GLYCOLAX/MIRALAX) powder Take 17 g by mouth daily as needed for moderate constipation. 02/15/17   [provider]  pravastatin (PRAVACHOL) 80 MG tablet Take 80 mg by mouth daily.    [provider]  promethazine (PHENERGAN) 25 MG tablet Take 25 mg by mouth every 6 (six) hours as needed for nausea or vomiting.    [provider]  sucralfate (CARAFATE) 1  g tablet Take 1 g by mouth 2 (two) times daily. 05/27/22   [provider]  topiramate (TOPAMAX) 100 MG tablet Take 100 mg by mouth at bedtime. 04/11/22   [provider]  traZODone (DESYREL) 100 MG tablet Take 100 mg by mouth at bedtime.    [provider]  triamterene-hydrochlorothiazide (MAXZIDE-25) 37.5-25 MG tablet Take 1 tablet by mouth daily. 07/17/21   [provider]  venlafaxine XR (EFFEXOR-XR) 75 MG 24 hr capsule Take 75 mg by mouth daily. 08/24/20   [provider]    Scheduled Meds:  insulin aspart  0-9 Units Subcutaneous Q4H   pantoprazole (PROTONIX) IV  40 mg Intravenous Q24H   Infusions:  ciprofloxacin 400 mg (07/25/22 9379)   lactated ringers 125 mL/hr at 07/25/22 1423   metronidazole 500 mg (07/25/22 0448)   PRN Meds: acetaminophen **OR** acetaminophen, labetalol, morphine injection, ondansetron (ZOFRAN) IV   Allergies as of 07/24/2022 - Review Complete 07/24/2022  Allergen Reaction Noted   Elemental sulfur Rash 07/06/2016   Sulfa antibiotics Rash 09/04/2020   Tricor [fenofibrate] Rash 07/06/2016    Family History  Problem Relation Age of Onset   Breast cancer Paternal Aunt 8   CVA Mother    Hypertension Mother    COPD Father     CAD Father    Diabetes Father     Social History   Socioeconomic History   Marital status: Widowed    Spouse name: Not on file   Number of children: Not on file   Years of education: Not on file   Highest education level: Not on file  Occupational History   Not on file  Tobacco Use   Smoking status: Never   Smokeless tobacco: Never  Vaping Use   Vaping Use: Never used  Substance and Sexual Activity   Alcohol use: No   Drug use: No   Sexual activity: Not on file  Other Topics Concern   Not on file  Social History Narrative   Not on file   Social Determinants of Health   Financial Resource Strain: Not on file  Food Insecurity: Not on file  Transportation Needs: Not on file  Physical Activity: Not on file  Stress: Not on file  Social Connections: Not on file  Intimate Partner Violence: Not on file    REVIEW OF SYSTEMS: Constitutional: Some fatigue but this is not profound. ENT:  No nose bleeds Pulm: No shortness of breath or cough. CV:  No palpitations, no LE edema.  No angina. GU:  No hematuria, no frequency.  No difficulty urinating. GI: See HPI. Heme: Denies unusual or excessive bleeding or bruising. Transfusions: None Neuro: Recent headaches.  No Hemi weakness. Derm:  No itching, no rash or sores.  Endocrine:  No sweats or chills.  No polyuria or dysuria Immunization: Not queried. Travel: Not queried.  PHYSICAL EXAM: Vital signs in last 24 hours: Vitals:   07/25/22 1059 07/25/22 1445  BP: (!) 143/62 (!) 142/72  Pulse: (!) 110 (!) 107  Resp: 17 18  Temp: 98 F (36.7 C) 98.8 F (37.1 C)  SpO2: 95% 94%   Wt Readings from Last 3 Encounters:  07/24/22 82.6 kg  07/18/22 82.6 kg  07/11/22 83.2 kg    General: Overweight, looks somewhat unwell but not acutely ill.  Moderately uncomfortable. Head: No facial asymmetry or swelling.  No signs of head trauma. Eyes: Conjunctiva is pink Ears: Not hard of hearing Nose: No congestion or discharge Mouth:  Tongue is  midline.  Mucosa is moist, pink, clear. Neck: No JVD, no masses, no thyromegaly Lungs: Clear bilaterally without labored breathing or cough Heart: Tacky but regular.  No MRG.  S1, S2 present Abdomen: Soft, obese.  Moderate distention.  Bowel sounds present but none are high-pitched or tinkling.  No increased tympany tenderness throughout the abdomen but more pronounced in the L mid to lower abdomen.  Do not appreciate masses, organomegaly, bruits, hernias.   Rectal: Deferred Musc/Skeltl: No joint redness, swelling or gross deformity Extremities: No CCE. Neurologic: Oriented x3.  No involuntary movements or tremors.  Moves all 4 limbs, strength not tested. Skin: No rash, no sores, no significant bruising or purpura Nodes: No cervical adenopathy Psych: Calm, cooperative, fluid speech.  Intake/Output from previous day: 08/06 0701 - 08/07 0700 In: 714.5 [I.V.:319.9; IV Piggyback:394.6] Out: -  Intake/Output this shift: No intake/output data recorded.  LAB RESULTS: Recent Labs    07/24/22 1453 07/25/22 0548  WBC 11.5* 13.6*  HGB 12.0 10.3*  HCT 37.6 31.8*  PLT 458* 388   BMET Lab Results  Component Value Date   NA 137 07/25/2022   NA 134 (L) 07/24/2022   NA 131 (L) 07/20/2022   K 4.3 07/25/2022   K 3.4 (L) 07/24/2022   K 3.2 (L) 07/20/2022   CL 103 07/25/2022   CL 99 07/24/2022   CL 101 07/20/2022   CO2 25 07/25/2022   CO2 24 07/24/2022   CO2 22 07/20/2022   GLUCOSE 147 (H) 07/25/2022   GLUCOSE 182 (H) 07/24/2022   GLUCOSE 150 (H) 07/20/2022   BUN 15 07/25/2022   BUN 16 07/24/2022   BUN 30 (H) 07/20/2022   CREATININE 1.17 (H) 07/25/2022   CREATININE 1.18 (H) 07/24/2022   CREATININE 2.03 (H) 07/20/2022   CALCIUM 8.6 (L) 07/25/2022   CALCIUM 9.1 07/24/2022   CALCIUM 7.8 (L) 07/20/2022   LFT Recent Labs    07/24/22 1453 07/25/22 0549  PROT 6.6 5.5*  ALBUMIN 3.0* 2.5*  AST 23 19  ALT 12 11  ALKPHOS 82 63  BILITOT 0.9 0.7   PT/INR No results  found for: "INR", "PROTIME" Hepatitis Panel No results for input(s): "HEPBSAG", "HCVAB", "HEPAIGM", "HEPBIGM" in the last 72 hours. C-Diff No components found for: "CDIFF" Lipase     Component Value Date/Time   LIPASE 28 07/24/2022 1453    Drugs of Abuse  No results found for: "LABOPIA", "COCAINSCRNUR", "LABBENZ", "AMPHETMU", "THCU", "LABBARB"   RADIOLOGY STUDIES: DG Pelvis Portable  Result Date: 07/24/2022 CLINICAL DATA:  Abdominal distension.  Recent lumbar decompression. EXAM: PORTABLE PELVIS 1-2 VIEWS COMPARISON:  None Available. FINDINGS: Postoperative changes in the lower lumbar spine. Mildly prominent bowel loops are partially visualized in the lower abdomen and upper pelvis. Contrast material seen within the renal collecting systems and bladder from earlier CT. No acute bony abnormality. IMPRESSION: Partially imaged dilated bowel loops in the lower abdomen. CT earlier abdominal CT for further discussion. Changes of lower lumbar fusion. No visible hardware complicating feature on this single AP image. Electronically Signed   By: Rolm Baptise M.D.   On: 07/24/2022 22:34   CT ABDOMEN PELVIS W CONTRAST  Result Date: 07/24/2022 CLINICAL DATA:  Recent back surgery 07/18/2022, constipation EXAM: CT ABDOMEN AND PELVIS WITH CONTRAST TECHNIQUE: Multidetector CT imaging of the abdomen and pelvis was performed using the standard protocol following bolus administration of intravenous contrast. RADIATION DOSE REDUCTION: This exam was performed according to the departmental dose-optimization program which includes automated exposure control, adjustment of  the mA and/or kV according to patient size and/or use of iterative reconstruction technique. CONTRAST:  61m OMNIPAQUE IOHEXOL 300 MG/ML  SOLN COMPARISON:  01/26/2021 FINDINGS: Lower chest: Bandlike dependent bibasilar scarring or atelectasis. Hepatobiliary: No focal liver abnormality is seen. Status post cholecystectomy. No biliary dilatation.  Pancreas: Unremarkable. No pancreatic ductal dilatation or surrounding inflammatory changes. Spleen: Normal in size without significant abnormality. Adrenals/Urinary Tract: Adrenal glands are unremarkable. Kidneys are normal, without renal calculi, solid lesion, or hydronephrosis. Bladder is unremarkable. Stomach/Bowel: Stomach is within normal limits. Appendix not clearly visualized and may be surgically absent. Small bowel is nondistended. The colon is diffusely stool, fluid, and air-filled, mildly distended, largest loops of cecum measuring up to 8.1 cm. Mild adjacent inflammatory fat stranding and trace fluid in the paracolic gutters. Vascular/Lymphatic: Aortic atherosclerosis. No enlarged abdominal or pelvic lymph nodes. Reproductive: Status post hysterectomy. Other: No abdominal wall hernia.  Anasarca. Musculoskeletal: Status post posterior laminectomy and interbody fusion of L4-L5 with an overlying air and fluid collection measuring approximately 5.3 x 2.9 cm (series 3, image 47). IMPRESSION: 1. The colon is diffusely stool, fluid, and air-filled, mildly distended, largest loops of cecum measuring up to 8.1 cm. Gas and stool present to the rectum. Mild adjacent inflammatory fat stranding and trace fluid in the paracolic gutters. Small bowel is nondistended. Findings suggest postoperative colonic ileus or alternately nonspecific infectious or inflammatory colitis, potentially including C diff colitis. 2. Status post posterior laminectomy and interbody fusion of L4-L5 with an overlying air and fluid collection in the subcutaneous soft tissue measuring approximately 5.3 x 2.9 cm. This most likely reflects postoperative hematoma or seroma in the recent postoperative setting, however the presence or absence of infection in this fluid is not established by CT. Aortic Atherosclerosis (ICD10-I70.0). Electronically Signed   By: ADelanna AhmadiM.D.   On: 07/24/2022 18:22      IMPRESSION:   Colonic ileus in setting  of frequent oxycodone following lumbar spine surgery.  Background of chronic constipation.  Reports of minor bleeding resulting from yesterday's smog enema.  CT raising some suspicion for nonspecific colitis.  Patient's daughter reports patient had episodes of hypotension before and after recent spinal surgery so ischemic colitis is in the differential diagnosis.  The hospitalist implemented IV Cipro and Flagyl for possibility of infectious colitis which is a more remote potential diagnosis.  Tubular adenomatous and hyperplastic colon polyps on colonoscopy in 06/2016.  Not clear when Dr. EVira Agarplanned to pursue follow-up surveillance study but she is out 6 years from that most recent colonoscopy.  Hypokalemia, corrected after 2 rounds of potassium.  07/20/2022 L4/L5 spinal decompression    Hx GERD.  Normally requires Protonix 40 mg daily for effective management.  Currently receiving Protonix 40 IV/24 h    PLAN:       Allow sips clears which pt is requesting.  Will  give Dulcolax supp PR q 6 h x 2.  KUB in AM.     SAzucena Freed 07/25/2022, 2:54 PM Phone [phone]

## 2022-07-25 NOTE — TOC Initial Note (Signed)
Transition of Care [facility]) - Initial/Assessment Note    Patient Details  Name: Sara Mcdonald MRN: [id number] Date of Birth: 1946/07/21  Transition of [facility]) CM/SW Contact:    Marilu Favre, RN Phone Number: 07/25/2022, 11:11 AM  Clinical Narrative:                  Patient active with [facility] for HHPT/OT and would like to continue services. Confirmed same with Amy with [facility] , she will need orders and face to face. Entered asked for MD signature  Expected Discharge Plan: [place] Barriers to Discharge: Continued Medical Work up   Patient Goals and CMS Choice Patient states their goals for this hospitalization and ongoing recovery are:: to return to home CMS Medicare.gov Compare Post Acute Care list provided to:: Patient Choice offered to / list presented to : Patient  Expected Discharge Plan and Services Expected Discharge Plan: [place]   Discharge Planning Services: CM Consult Post Acute Care Choice: [place] arrangements for the past 2 months: Single Family Home                   DME Agency: NA       HH Arranged: PT, OT HH Agency: [place] Date [facility] Agency Contacted: 07/25/22 Time [place]: 1111 Representative spoke with at [place]: [place] Arrangements/Services Living arrangements for the past 2 months: [place]   Patient language and need for interpreter reviewed:: Yes Do you feel safe going back to the place where you live?: Yes      Need for Family Participation in Patient Care: Yes (Comment) Care giver support system in place?: Yes (comment)   Criminal Activity/Legal Involvement Pertinent to Current Situation/Hospitalization: No - Comment as needed  Activities of Daily Living Home Assistive Devices/Equipment: Environmental consultant (specify type), Shower chair with back ADL Screening (condition at time of admission) Patient's cognitive ability adequate to  safely complete daily activities?: Yes Is the patient deaf or have difficulty hearing?: No Does the patient have difficulty seeing, even when wearing glasses/contacts?: No Does the patient have difficulty concentrating, remembering, or making decisions?: No Patient able to express need for assistance with ADLs?: Yes Does the patient have difficulty dressing or bathing?: No Independently performs ADLs?: No Communication: Independent Dressing (OT): Independent Grooming: Independent Feeding: Independent Bathing: Independent Toileting: Needs assistance Is this a change from baseline?: Pre-admission baseline In/Out Bed: Needs assistance Is this a change from baseline?: Pre-admission baseline Walks in Home: Independent with device (comment) Is this a change from baseline?: Pre-admission baseline Does the patient have difficulty walking or climbing stairs?: Yes Weakness of Legs: Both Weakness of Arms/Hands: None  Permission Sought/Granted   Permission granted to share information with : No              Emotional Assessment Appearance:: Appears stated age Attitude/Demeanor/Rapport: Engaged Affect (typically observed): Accepting Orientation: : Oriented to Self, Oriented to Place, Oriented to  Time, Oriented to Situation Alcohol / Substance Use: Not Applicable Psych Involvement: No (comment)  Admission diagnosis:  Ileus [facility]) [K56.7] Patient Active Problem List   Diagnosis Date Noted   Ileus ([place]) 07/24/2022   Spondylolisthesis of lumbar region 07/18/2022   Lumbar spondylosis 11/17/2021   Lumbar degenerative disc disease 11/17/2021   Chronic pain syndrome 11/17/2021   Sinus tachycardia    Generalized abdominal pain    Acute bronchitis    Weakness  Severe sepsis with acute organ dysfunction ([place]) 09/04/2020   Diabetes mellitus without complication ([place])    Hypertension associated with diabetes ([place])    Acute kidney injury superimposed on CKD ([place])    Acute lower UTI     Anxiety and depression    OSA (obstructive sleep apnea)    Type 2 diabetes mellitus with hyperlipidemia ([place])    Acute metabolic encephalopathy    Moderate persistent asthma    Sepsis ([place]) 03/17/2017   Community acquired pneumonia 03/17/2017   COPD with acute exacerbation ([place]) 03/17/2017   Hypoxia 03/17/2017   Hyponatremia 03/17/2017   Acute on chronic renal insufficiency 03/17/2017   Pneumonia 03/17/2017   Acute respiratory failure with hypoxia ([place]) 02/01/2017   PCP:  [name], MD Pharmacy:   [facility], [place] [address] Phone: [phone] Fax: [phone]     Social Determinants of Health (SDOH) Interventions    Readmission Risk Interventions     No data to display

## 2022-07-25 NOTE — Plan of Care (Signed)

## 2022-07-25 NOTE — ED Notes (Signed)
Requested purple man be initiated.

## 2022-07-26 ENCOUNTER — Inpatient Hospital Stay (HOSPITAL_COMMUNITY): Payer: PPO

## 2022-07-26 DIAGNOSIS — K567 Ileus, unspecified: Secondary | ICD-10-CM | POA: Diagnosis not present

## 2022-07-26 DIAGNOSIS — F419 Anxiety disorder, unspecified: Secondary | ICD-10-CM | POA: Diagnosis not present

## 2022-07-26 DIAGNOSIS — E1159 Type 2 diabetes mellitus with other circulatory complications: Secondary | ICD-10-CM | POA: Diagnosis not present

## 2022-07-26 DIAGNOSIS — E1169 Type 2 diabetes mellitus with other specified complication: Secondary | ICD-10-CM | POA: Diagnosis not present

## 2022-07-26 LAB — GLUCOSE, CAPILLARY
Glucose-Capillary: 109 mg/dL — ABNORMAL HIGH (ref 70–99)
Glucose-Capillary: 118 mg/dL — ABNORMAL HIGH (ref 70–99)
Glucose-Capillary: 120 mg/dL — ABNORMAL HIGH (ref 70–99)
Glucose-Capillary: 124 mg/dL — ABNORMAL HIGH (ref 70–99)
Glucose-Capillary: 126 mg/dL — ABNORMAL HIGH (ref 70–99)
Glucose-Capillary: 131 mg/dL — ABNORMAL HIGH (ref 70–99)

## 2022-07-26 LAB — BASIC METABOLIC PANEL
Anion gap: 11 (ref 5–15)
BUN: 13 mg/dL (ref 8–23)
CO2: 21 mmol/L — ABNORMAL LOW (ref 22–32)
Calcium: 8.4 mg/dL — ABNORMAL LOW (ref 8.9–10.3)
Chloride: 102 mmol/L (ref 98–111)
Creatinine, Ser: 1.19 mg/dL — ABNORMAL HIGH (ref 0.44–1.00)
GFR, Estimated: 47 mL/min — ABNORMAL LOW (ref >60)
Glucose, Bld: 118 mg/dL — ABNORMAL HIGH (ref 70–99)
Potassium: 3.9 mmol/L (ref 3.5–5.1)
Sodium: 134 mmol/L — ABNORMAL LOW (ref 135–145)

## 2022-07-26 LAB — CBC
HCT: 29.7 % — ABNORMAL LOW (ref 36.0–46.0)
Hemoglobin: 9.4 g/dL — ABNORMAL LOW (ref 12.0–15.0)
MCH: 29.5 pg (ref 26.0–34.0)
MCHC: 31.6 g/dL (ref 30.0–36.0)
MCV: 93.1 fL (ref 80.0–100.0)
Platelets: 360 10*3/uL (ref 150–400)
RBC: 3.19 MIL/uL — ABNORMAL LOW (ref 3.87–5.11)
RDW: 14.1 % (ref 11.5–15.5)
WBC: 17 10*3/uL — ABNORMAL HIGH (ref 4.0–10.5)
nRBC: 0 % (ref 0.0–0.2)

## 2022-07-26 MED ORDER — LACTATED RINGERS IV SOLN: INTRAVENOUS | Status: DC

## 2022-07-26 MED ORDER — INSULIN ASPART 100 UNIT/ML IJ SOLN
0.0000 [IU] | Freq: Three times a day (TID) | INTRAMUSCULAR | Status: DC
  Administered 2022-07-26 – 2022-07-27 (×4): 1 [IU] via SUBCUTANEOUS
  Administered 2022-07-28: 3 [IU] via SUBCUTANEOUS
  Administered 2022-07-28 – 2022-07-31 (×6): 1 [IU] via SUBCUTANEOUS
  Administered 2022-08-01: 2 [IU] via SUBCUTANEOUS
  Administered 2022-08-01: 1 [IU] via SUBCUTANEOUS
  Administered 2022-08-02 – 2022-08-04 (×5): 2 [IU] via SUBCUTANEOUS

## 2022-07-26 MED ORDER — SORBITOL 70 % SOLN
960.0000 mL | TOPICAL_OIL | Freq: Once | ORAL | Status: AC
  Administered 2022-07-26: 960 mL via RECTAL
  Filled 2022-07-26: qty 473

## 2022-07-26 MED ORDER — ALPRAZOLAM 0.25 MG PO TABS
0.5000 mg | ORAL_TABLET | Freq: Two times a day (BID) | ORAL | Status: DC | PRN
Start: 2022-07-26 — End: 2022-08-04
  Administered 2022-07-26 – 2022-08-03 (×13): 0.5 mg via ORAL
  Filled 2022-07-26 (×13): qty 2

## 2022-07-26 MED ORDER — METHYLNALTREXONE BROMIDE 12 MG/0.6ML ~~LOC~~ SOLN
12.0000 mg | Freq: Once | SUBCUTANEOUS | Status: AC
  Administered 2022-07-26: 12 mg via SUBCUTANEOUS
  Filled 2022-07-26: qty 0.6

## 2022-07-26 MED ORDER — IRBESARTAN 150 MG PO TABS
150.0000 mg | ORAL_TABLET | Freq: Every day | ORAL | Status: DC
  Administered 2022-07-26 – 2022-07-29 (×4): 150 mg via ORAL
  Filled 2022-07-26 (×4): qty 1

## 2022-07-26 NOTE — Progress Notes (Signed)
PROGRESS NOTE    ARZU MCGAUGHEY  LZJ:[id number] DOB: Oct 24, 1946 DOA: 07/24/2022 PCP: [name], MD   Brief Narrative: CHARNELE SEMPLE is a 76 y.o. female with a history of diabetes mellitus type 2, fibromyalgia, GERD, sleep apnea, recent L4-L5 decompression surgery, depression, anxiety. Patient presented secondary to abdominal pain with bowel movements with concern for ileus vs inflammatory colitis seen on CT imaging. GI consulted. Empiric antibiotics initiated.   Assessment and Plan:  Abdominal pain Concern for postoperative colonic ileus vs inflammatory colitis. History of blood per rectum. Patient has been on opiates for her post-operative pain management. GI consulted. Patient made NPO on admission and advanced to clear liquid. WBC increased slightly. -Follow-up GI recommendations: Clear liquid diet, consider Relistor -Morphine PRN, minimize as able -Continue empiric Ciprofloxacin and Flagyl empirically  History of L4-L5 decompression Recent surgery. Neurosurgery alerted while patient was in the ED.  Diabetes mellitus, type 2 Patient is on metformin as an outpatient, which was held. -Continue SSI  Primary hypertension Patient is on olmesartan and Maxide as an outpatient which were held secondary to NPO status -Resume olmesartan (irbesartan while inpatient)  Anxiety Depression Patient is on Xanax and Effexor as an outpatient which were held while patient is NPO. Awaiting medication history verification.  Hyperlipidemia Pravastatin held on admission  CKD stage IIIa Creatinine is stable.   DVT prophylaxis: SCDs Code Status:   Code Status: Full Code Family Communication: None at bedside Disposition Plan: Discharge home likely in 2-3 days pending improvement of ileus, GI evaluation and recommendations and advancement of diet   Consultants:  [facility]  Procedures:  None  Antimicrobials: Ciprofloxacin Flagyl    Subjective: Patient  reports abdominal pain when attempting to take PO yesterday. No emesis. She reports a bowel movement with only mucous. Decreased flatus. Continued abdominal pain that has remained stable since admission.  Objective: BP 137/73 (BP Location: Right Arm)   Pulse (!) 107   Temp 98.3 F (36.8 C) (Oral)   Resp 18   Ht '4\' 11"'$  (1.499 m)   Wt 82.6 kg   SpO2 95%   BMI 36.76 kg/m   Examination:  General exam: Appears calm and comfortable Respiratory system: Clear to auscultation. Respiratory effort normal. Cardiovascular system: S1 & S2 heard, RRR. No murmurs, rubs, gallops or clicks. Gastrointestinal system: Abdomen is mildly distended, soft and generally mildly tender. Normal bowel sounds heard. Central nervous system: Alert and oriented. No focal neurological deficits. Musculoskeletal: No calf tenderness Skin: No cyanosis. No rashes Psychiatry: Judgement and insight appear normal. Mood & affect appropriate.    Data Reviewed: I have personally reviewed following labs and imaging studies  CBC Lab Results  Component Value Date   WBC 17.0 (H) 07/26/2022   RBC 3.19 (L) 07/26/2022   HGB 9.4 (L) 07/26/2022   HCT 29.7 (L) 07/26/2022   MCV 93.1 07/26/2022   MCH 29.5 07/26/2022   PLT 360 07/26/2022   MCHC 31.6 07/26/2022   RDW 14.1 07/26/2022   LYMPHSABS 0.4 (L) 03/17/2017   MONOABS 0.6 03/17/2017   EOSABS 0.1 03/17/2017   BASOSABS 0.0 02/40/9735     Last metabolic panel Lab Results  Component Value Date   NA 134 (L) 07/26/2022   K 3.9 07/26/2022   CL 102 07/26/2022   CO2 21 (L) 07/26/2022   BUN 13 07/26/2022   CREATININE 1.19 (H) 07/26/2022   GLUCOSE 118 (H) 07/26/2022   GFRNONAA 47 (L) 07/26/2022   GFRAA 44 (L) 09/07/2020   CALCIUM  8.4 (L) 07/26/2022   PROT 5.5 (L) 07/25/2022   ALBUMIN 2.5 (L) 07/25/2022   BILITOT 0.7 07/25/2022   ALKPHOS 63 07/25/2022   AST 19 07/25/2022   ALT 11 07/25/2022   ANIONGAP 11 07/26/2022    GFR: Estimated Creatinine Clearance: 37.5  mL/min (A) (by C-G formula based on SCr of 1.19 mg/dL (H)).  Recent Results (from the past 240 hour(s))  C Difficile Quick Screen w PCR reflex     Status: None   Collection Time: 07/25/22  1:56 AM   Specimen: STOOL  Result Value Ref Range Status   C Diff antigen NEGATIVE NEGATIVE Final   C Diff toxin NEGATIVE NEGATIVE Final   C Diff interpretation No C. difficile detected.  Final    Comment: Performed at [facility], [place] [address] [place],  [address]      Radiology Studies: DG Abd 1 View  Result Date: 07/26/2022 CLINICAL DATA:  Ileus. EXAM: ABDOMEN - 1 VIEW COMPARISON:  07/24/2022 and CT 07/24/2022. FINDINGS: There continues to be gas-filled loops of throughout the abdomen. Most dilated loops of bowel continue to be the colon. Postsurgical changes in lower lumbar spine. Residual high-density material in the urinary bladder compatible with previous contrast administration. Limited evaluation of the lung bases. Difficult to exclude basilar chest disease. Limited evaluation for free air on the supine images. IMPRESSION: Persistent gas-filled loops of bowel throughout the abdomen and pelvis with distension of the colon. Findings are similar to the recent comparison examinations and could represent an ileus. Electronically Signed   By: Markus Daft M.D.   On: 07/26/2022 07:33   DG Pelvis Portable  Result Date: 07/24/2022 CLINICAL DATA:  Abdominal distension.  Recent lumbar decompression. EXAM: PORTABLE PELVIS 1-2 VIEWS COMPARISON:  None Available. FINDINGS: Postoperative changes in the lower lumbar spine. Mildly prominent bowel loops are partially visualized in the lower abdomen and upper pelvis. Contrast material seen within the renal collecting systems and bladder from earlier CT. No acute bony abnormality. IMPRESSION: Partially imaged dilated bowel loops in the lower abdomen. CT earlier abdominal CT for further discussion. Changes of lower lumbar fusion. No visible hardware  complicating feature on this single AP image. Electronically Signed   By: Rolm Baptise M.D.   On: 07/24/2022 22:34   CT ABDOMEN PELVIS W CONTRAST  Result Date: 07/24/2022 CLINICAL DATA:  Recent back surgery 07/18/2022, constipation EXAM: CT ABDOMEN AND PELVIS WITH CONTRAST TECHNIQUE: Multidetector CT imaging of the abdomen and pelvis was performed using the standard protocol following bolus administration of intravenous contrast. RADIATION DOSE REDUCTION: This exam was performed according to the departmental dose-optimization program which includes automated exposure control, adjustment of the mA and/or kV according to patient size and/or use of iterative reconstruction technique. CONTRAST:  80m OMNIPAQUE IOHEXOL 300 MG/ML  SOLN COMPARISON:  01/26/2021 FINDINGS: Lower chest: Bandlike dependent bibasilar scarring or atelectasis. Hepatobiliary: No focal liver abnormality is seen. Status post cholecystectomy. No biliary dilatation. Pancreas: Unremarkable. No pancreatic ductal dilatation or surrounding inflammatory changes. Spleen: Normal in size without significant abnormality. Adrenals/Urinary Tract: Adrenal glands are unremarkable. Kidneys are normal, without renal calculi, solid lesion, or hydronephrosis. Bladder is unremarkable. Stomach/Bowel: Stomach is within normal limits. Appendix not clearly visualized and may be surgically absent. Small bowel is nondistended. The colon is diffusely stool, fluid, and air-filled, mildly distended, largest loops of cecum measuring up to 8.1 cm. Mild adjacent inflammatory fat stranding and trace fluid in the paracolic gutters. Vascular/Lymphatic: Aortic atherosclerosis. No enlarged abdominal or pelvic  lymph nodes. Reproductive: Status post hysterectomy. Other: No abdominal wall hernia.  Anasarca. Musculoskeletal: Status post posterior laminectomy and interbody fusion of L4-L5 with an overlying air and fluid collection measuring approximately 5.3 x 2.9 cm (series 3, image 47).  IMPRESSION: 1. The colon is diffusely stool, fluid, and air-filled, mildly distended, largest loops of cecum measuring up to 8.1 cm. Gas and stool present to the rectum. Mild adjacent inflammatory fat stranding and trace fluid in the paracolic gutters. Small bowel is nondistended. Findings suggest postoperative colonic ileus or alternately nonspecific infectious or inflammatory colitis, potentially including C diff colitis. 2. Status post posterior laminectomy and interbody fusion of L4-L5 with an overlying air and fluid collection in the subcutaneous soft tissue measuring approximately 5.3 x 2.9 cm. This most likely reflects postoperative hematoma or seroma in the recent postoperative setting, however the presence or absence of infection in this fluid is not established by CT. Aortic Atherosclerosis (ICD10-I70.0). Electronically Signed   By: Delanna Ahmadi M.D.   On: 07/24/2022 18:22      LOS: 1 day    Cordelia Poche, MD [facility] 07/26/2022, 9:27 AM   If 7PM-7AM, please contact night-coverage [email or web address]

## 2022-07-26 NOTE — Progress Notes (Addendum)
Daily Rounding Note  07/26/2022, 11:00 AM  LOS: 1 day   SUBJECTIVE:   Chief complaint:   colonic ileus   Pain in the upper abdomen persists along with bloating.  Not passing flatus of any significance.  Had a blood-tinged watery stool this morning, the same as she had had yesterday morning.  These are the only 2 spontaneous bowel movements she has had in the last 36 hours.  When she ate Jell-O yesterday it caused worsening of the pain so she has not had any more of her clear trays.  Feeling quite miserable. Has not been up out of bed, not ambulating though physical therapy doing some work with her. Received 6 mg of morphine yesterday along with 25 mcg fentanyl.  Fentanyl now discontinued.  Today she has received 4 mg of morphine in total.  OBJECTIVE:         Vital signs in last 24 hours:    Temp:  [97.9 F (36.6 C)-98.8 F (37.1 C)] 98.3 F (36.8 C) (08/08 0834) Pulse Rate:  [107-112] 107 (08/08 0834) Resp:  [18-22] 18 (08/08 0834) BP: (137-157)/(69-73) 137/73 (08/08 0834) SpO2:  [92 %-95 %] 95 % (08/08 0834) Last BM Date : 07/25/22 Filed Weights   07/24/22 1438  Weight: 82.6 kg   General: Uncomfortable.  Obese. Heart: RRR.   Chest: Clear bilaterally. Abdomen: Obese, distended, moderately tense.  Tender throughout all quadrants.  Tympanitic bowel sounds. Extremities: No CCE. Neuro/Psych: Cooperative, a little bit anxious.  Fluid speech.  Intake/Output from previous day: 08/07 0701 - 08/08 0700 In: 1139 [I.V.:968.6; IV Piggyback:170.4] Out: -   Intake/Output this shift: Total I/O In: 100 [P.O.:100] Out: -   Lab Results: Recent Labs    07/24/22 1453 07/25/22 0548 07/26/22 0208  WBC 11.5* 13.6* 17.0*  HGB 12.0 10.3* 9.4*  HCT 37.6 31.8* 29.7*  PLT 458* 388 360   BMET Recent Labs    07/24/22 1453 07/25/22 0549 07/26/22 0208  NA 134* 137 134*  K 3.4* 4.3 3.9  CL 99 103 102  CO2 24 25 21*  GLUCOSE  182* 147* 118*  BUN '16 15 13  '$ CREATININE 1.18* 1.17* 1.19*  CALCIUM 9.1 8.6* 8.4*   LFT Recent Labs    07/24/22 1453 07/25/22 0549  PROT 6.6 5.5*  ALBUMIN 3.0* 2.5*  AST 23 19  ALT 12 11  ALKPHOS 82 63  BILITOT 0.9 0.7   PT/INR No results for input(s): "LABPROT", "INR" in the last 72 hours. Hepatitis Panel No results for input(s): "HEPBSAG", "HCVAB", "HEPAIGM", "HEPBIGM" in the last 72 hours.  Studies/Results: DG Abd 1 View  Result Date: 07/26/2022 CLINICAL DATA:  Ileus. EXAM: ABDOMEN - 1 VIEW COMPARISON:  07/24/2022 and CT 07/24/2022. FINDINGS: There continues to be gas-filled loops of throughout the abdomen. Most dilated loops of bowel continue to be the colon. Postsurgical changes in lower lumbar spine. Residual high-density material in the urinary bladder compatible with previous contrast administration. Limited evaluation of the lung bases. Difficult to exclude basilar chest disease. Limited evaluation for free air on the supine images. IMPRESSION: Persistent gas-filled loops of bowel throughout the abdomen and pelvis with distension of the colon. Findings are similar to the recent comparison examinations and could represent an ileus. Electronically Signed   By: Markus Daft M.D.   On: 07/26/2022 07:33   DG Pelvis Portable  Result Date: 07/24/2022 CLINICAL DATA:  Abdominal distension.  Recent lumbar decompression. EXAM: PORTABLE PELVIS 1-2  VIEWS COMPARISON:  None Available. FINDINGS: Postoperative changes in the lower lumbar spine. Mildly prominent bowel loops are partially visualized in the lower abdomen and upper pelvis. Contrast material seen within the renal collecting systems and bladder from earlier CT. No acute bony abnormality. IMPRESSION: Partially imaged dilated bowel loops in the lower abdomen. CT earlier abdominal CT for further discussion. Changes of lower lumbar fusion. No visible hardware complicating feature on this single AP image. Electronically Signed   By: Rolm Baptise M.D.   On: 07/24/2022 22:34   CT ABDOMEN PELVIS W CONTRAST  Result Date: 07/24/2022 CLINICAL DATA:  Recent back surgery 07/18/2022, constipation EXAM: CT ABDOMEN AND PELVIS WITH CONTRAST TECHNIQUE: Multidetector CT imaging of the abdomen and pelvis was performed using the standard protocol following bolus administration of intravenous contrast. RADIATION DOSE REDUCTION: This exam was performed according to the departmental dose-optimization program which includes automated exposure control, adjustment of the mA and/or kV according to patient size and/or use of iterative reconstruction technique. CONTRAST:  78m OMNIPAQUE IOHEXOL 300 MG/ML  SOLN COMPARISON:  01/26/2021 FINDINGS: Lower chest: Bandlike dependent bibasilar scarring or atelectasis. Hepatobiliary: No focal liver abnormality is seen. Status post cholecystectomy. No biliary dilatation. Pancreas: Unremarkable. No pancreatic ductal dilatation or surrounding inflammatory changes. Spleen: Normal in size without significant abnormality. Adrenals/Urinary Tract: Adrenal glands are unremarkable. Kidneys are normal, without renal calculi, solid lesion, or hydronephrosis. Bladder is unremarkable. Stomach/Bowel: Stomach is within normal limits. Appendix not clearly visualized and may be surgically absent. Small bowel is nondistended. The colon is diffusely stool, fluid, and air-filled, mildly distended, largest loops of cecum measuring up to 8.1 cm. Mild adjacent inflammatory fat stranding and trace fluid in the paracolic gutters. Vascular/Lymphatic: Aortic atherosclerosis. No enlarged abdominal or pelvic lymph nodes. Reproductive: Status post hysterectomy. Other: No abdominal wall hernia.  Anasarca. Musculoskeletal: Status post posterior laminectomy and interbody fusion of L4-L5 with an overlying air and fluid collection measuring approximately 5.3 x 2.9 cm (series 3, image 47). IMPRESSION: 1. The colon is diffusely stool, fluid, and air-filled, mildly  distended, largest loops of cecum measuring up to 8.1 cm. Gas and stool present to the rectum. Mild adjacent inflammatory fat stranding and trace fluid in the paracolic gutters. Small bowel is nondistended. Findings suggest postoperative colonic ileus or alternately nonspecific infectious or inflammatory colitis, potentially including C diff colitis. 2. Status post posterior laminectomy and interbody fusion of L4-L5 with an overlying air and fluid collection in the subcutaneous soft tissue measuring approximately 5.3 x 2.9 cm. This most likely reflects postoperative hematoma or seroma in the recent postoperative setting, however the presence or absence of infection in this fluid is not established by CT. Aortic Atherosclerosis (ICD10-I70.0). Electronically Signed   By: ADelanna AhmadiM.D.   On: 07/24/2022 18:22    ASSESMENT:   Colonic ileus following lumbar spine surgery in setting of chronic pre-existing constipation and frequent postoperative oxycodone.  KUB today still demonstrating gas-filled loops of bowel throughout the abdomen with colonic distention.  Patient still complaining of significant abdominal pain which is not typical for ileus, CT concerning for colitis and ischemic etiology is a possibility.  Leukocytosis with WBC change from 11.5... 13.7..Marland KitchenMarland Kitchen17 over the last 3 days.  Day 3 IV cipro/Flagyl.    Hypokalemia, remains corrected.  07/20/2022 lumbar spine surgery.  Still requiring prn morphine  Normocytic anemia. b 9.4.   PLAN     ? Colonoscopy/flex sig wo oral prep for decompression and to assess the question of colitis?  Add Metoclopramide??      UA was ordered 2 d ago but spec yet to be collected.    Call to pharmacist confirms Relistor is available.   Restricted to use in patients who meet both of the following criteria: - Have opioid-induced constipation. - Have failed conventional laxative therapy using maximum tolerated doses of a  stimulant/stool softener combination or  enema. I will discuss use of this w Dr Lorenso Courier.      Azucena Freed  07/26/2022, 11:00 AM Phone [phone]

## 2022-07-26 NOTE — Progress Notes (Signed)
Smog enema administered this evening with 2 bowel movements reported

## 2022-07-26 NOTE — Progress Notes (Signed)
Mobility Specialist - Progress Note   07/26/22 1600  Mobility  Activity Ambulated with assistance in hallway  Level of Assistance Modified independent, requires aide device or extra time  Assistive Device Front wheel walker  Distance Ambulated (ft) 250 ft  Activity Response Tolerated well  $Mobility charge 1 Mobility   Pt received in bed agreeable to mobility. No complaints throughout ambulation. Left in recliner with call bell and all needs met.   Paulla Dolly Mobility Specialist

## 2022-07-27 ENCOUNTER — Inpatient Hospital Stay (HOSPITAL_COMMUNITY): Payer: PPO

## 2022-07-27 DIAGNOSIS — K567 Ileus, unspecified: Secondary | ICD-10-CM | POA: Diagnosis not present

## 2022-07-27 LAB — GLUCOSE, CAPILLARY
Glucose-Capillary: 116 mg/dL — ABNORMAL HIGH (ref 70–99)
Glucose-Capillary: 122 mg/dL — ABNORMAL HIGH (ref 70–99)
Glucose-Capillary: 167 mg/dL — ABNORMAL HIGH (ref 70–99)
Glucose-Capillary: 130 mg/dL — ABNORMAL HIGH (ref 70–99)
Glucose-Capillary: 147 mg/dL — ABNORMAL HIGH (ref 70–99)

## 2022-07-27 LAB — CBC
HCT: 29.9 % — ABNORMAL LOW (ref 36.0–46.0)
Hemoglobin: 9.6 g/dL — ABNORMAL LOW (ref 12.0–15.0)
MCH: 29.9 pg (ref 26.0–34.0)
MCHC: 32.1 g/dL (ref 30.0–36.0)
MCV: 93.1 fL (ref 80.0–100.0)
Platelets: 416 10*3/uL — ABNORMAL HIGH (ref 150–400)
RBC: 3.21 MIL/uL — ABNORMAL LOW (ref 3.87–5.11)
RDW: 14.1 % (ref 11.5–15.5)
WBC: 19.2 10*3/uL — ABNORMAL HIGH (ref 4.0–10.5)
nRBC: 0 % (ref 0.0–0.2)

## 2022-07-27 LAB — BASIC METABOLIC PANEL
Anion gap: 12 (ref 5–15)
BUN: 9 mg/dL (ref 8–23)
CO2: 24 mmol/L (ref 22–32)
Calcium: 8.5 mg/dL — ABNORMAL LOW (ref 8.9–10.3)
Chloride: 99 mmol/L (ref 98–111)
Creatinine, Ser: 1.13 mg/dL — ABNORMAL HIGH (ref 0.44–1.00)
GFR, Estimated: 50 mL/min — ABNORMAL LOW (ref >60)
Glucose, Bld: 113 mg/dL — ABNORMAL HIGH (ref 70–99)
Potassium: 3.8 mmol/L (ref 3.5–5.1)
Sodium: 135 mmol/L (ref 135–145)

## 2022-07-27 MED ORDER — TRAMADOL HCL 50 MG PO TABS
50.0000 mg | ORAL_TABLET | Freq: Four times a day (QID) | ORAL | Status: DC | PRN
  Administered 2022-07-27 – 2022-08-01 (×5): 50 mg via ORAL
  Filled 2022-07-27 (×5): qty 1

## 2022-07-27 MED ORDER — PANTOPRAZOLE SODIUM 40 MG PO TBEC
40.0000 mg | DELAYED_RELEASE_TABLET | Freq: Every day | ORAL | Status: DC
  Administered 2022-07-28 – 2022-08-01 (×5): 40 mg via ORAL
  Filled 2022-07-27 (×5): qty 1

## 2022-07-27 MED ORDER — ACETAMINOPHEN 500 MG PO TABS
1000.0000 mg | ORAL_TABLET | Freq: Three times a day (TID) | ORAL | Status: DC
  Administered 2022-07-27 – 2022-08-04 (×22): 1000 mg via ORAL
  Filled 2022-07-27 (×23): qty 2

## 2022-07-27 MED ORDER — SORBITOL 70 % SOLN
960.0000 mL | TOPICAL_OIL | Freq: Once | ORAL | Status: AC
  Administered 2022-07-27: 960 mL via RECTAL
  Filled 2022-07-27: qty 473

## 2022-07-27 MED ORDER — OXYCODONE HCL 5 MG PO TABS
5.0000 mg | ORAL_TABLET | Freq: Four times a day (QID) | ORAL | Status: DC | PRN
  Administered 2022-07-27: 5 mg via ORAL
  Filled 2022-07-27 (×2): qty 1

## 2022-07-27 NOTE — Progress Notes (Signed)
Mobility Specialist - Progress Note    07/27/22 1032  Mobility  Activity Ambulated with assistance in hallway  Level of Assistance Minimal assist, patient does 75% or more  Assistive Device Front wheel walker  Distance Ambulated (ft) 140 ft  Activity Response Tolerated well  $Mobility charge 1 Mobility   Pt was found in recliner chair and was agreeable to mobilize. Complained of a level 10/10 hip pain while ambulating. Pt returned to room and was left on bed with all necessities in reach.  Ferd Hibbs Mobility Specialist

## 2022-07-27 NOTE — Progress Notes (Addendum)
   07/27/22 1548  Assess: MEWS Score  Temp 98.6 F (37 C)  BP (!) 154/75  MAP (mmHg) 93  Pulse Rate (!) 115  Resp 17  SpO2 98 %  O2 Device Room Air  Assess: MEWS Score  MEWS Temp 0  MEWS Systolic 0  MEWS Pulse 2  MEWS RR 0  MEWS LOC 0  MEWS Score 2  MEWS Score Color Yellow  Assess: SIRS CRITERIA  SIRS Temperature  0  SIRS Pulse 1  SIRS Respirations  0  SIRS WBC 1  SIRS Score Sum  2   MD is aware at 1410.

## 2022-07-27 NOTE — Progress Notes (Addendum)
Daily Rounding Note  07/27/2022, 8:31 AM  LOS: 2 days   SUBJECTIVE:   Chief complaint:      Brown, watery, nonbloody BM's x2 after yesterday's SMOG enema and Relistor.  1 additional watery brown stool this morning.  Not passing flatus.  No nausea.  Abdominal pain, most prominent in the lower abdomen, persists but improved.  Pain still accelerates w po intake of any volume.   Also now complaining of hip pain. Walked in hallway yesterday and again this morning. Visit from neurosurgeon Dr Arnoldo Morale this AM, no note yet in chart.   Potassium remains normal.   Used 16 mg of Morphine yesterday (up from 8 and 6 mg previous 2 days), 4 mg so far today, charting indicates this was for back and abdominal pain  Tachy to 1 teens, no hypotension.  O2 sats low to mid 90s.       OBJECTIVE:         Vital signs in last 24 hours:    Temp:  [98 F (36.7 C)-98.3 F (36.8 C)] 98.1 F (36.7 C) (08/09 0749) Pulse Rate:  [107-118] 108 (08/09 0749) Resp:  [17-18] 17 (08/09 0749) BP: (131-150)/(68-79) 131/68 (08/09 0749) SpO2:  [90 %-95 %] 90 % (08/09 0749) Last BM Date : 07/26/22 Filed Weights   07/24/22 1438  Weight: 82.6 kg   General: More relaxed today.  Seems in less distress. Heart: RRR. Chest: No labored breathing or cough. Abdomen: Soft.  Mild to moderate tenderness throughout but most prominent on the LLQ. Extremities: No CCE. Neuro/Psych: More relaxed.  Cooperative, pleasant, not confused.  No gross deficits.  Intake/Output from previous day: 08/08 0701 - 08/09 0700 In: 707.5 [P.O.:320; I.V.:387.5] Out: -   Intake/Output this shift: No intake/output data recorded.  Lab Results: Recent Labs    07/25/22 0548 07/26/22 0208 07/27/22 0136  WBC 13.6* 17.0* 19.2*  HGB 10.3* 9.4* 9.6*  HCT 31.8* 29.7* 29.9*  PLT 388 360 416*   BMET Recent Labs    07/25/22 0549 07/26/22 0208 07/27/22 0136  NA 137 134* 135  K 4.3 3.9 3.8   CL 103 102 99  CO2 25 21* 24  GLUCOSE 147* 118* 113*  BUN '15 13 9  '$ CREATININE 1.17* 1.19* 1.13*  CALCIUM 8.6* 8.4* 8.5*   LFT Recent Labs    07/24/22 1453 07/25/22 0549  PROT 6.6 5.5*  ALBUMIN 3.0* 2.5*  AST 23 19  ALT 12 11  ALKPHOS 82 63  BILITOT 0.9 0.7   PT/INR No results for input(s): "LABPROT", "INR" in the last 72 hours. Hepatitis Panel No results for input(s): "HEPBSAG", "HCVAB", "HEPAIGM", "HEPBIGM" in the last 72 hours.  Studies/Results: DG Abd 1 View  Result Date: 07/26/2022 CLINICAL DATA:  Ileus. EXAM: ABDOMEN - 1 VIEW COMPARISON:  07/24/2022 and CT 07/24/2022. FINDINGS: There continues to be gas-filled loops of throughout the abdomen. Most dilated loops of bowel continue to be the colon. Postsurgical changes in lower lumbar spine. Residual high-density material in the urinary bladder compatible with previous contrast administration. Limited evaluation of the lung bases. Difficult to exclude basilar chest disease. Limited evaluation for free air on the supine images. IMPRESSION: Persistent gas-filled loops of bowel throughout the abdomen and pelvis with distension of the colon. Findings are similar to the recent comparison examinations and could represent an ileus. Electronically Signed   By: Markus Daft M.D.   On: 07/26/2022 07:33    Scheduled Meds:  insulin  aspart  0-9 Units Subcutaneous TID WC   irbesartan  150 mg Oral Daily   pantoprazole (PROTONIX) IV  40 mg Intravenous Q24H   Continuous Infusions:  ciprofloxacin 400 mg (07/27/22 0546)   lactated ringers 75 mL/hr at 07/26/22 2333   metronidazole 500 mg (07/27/22 0537)   PRN Meds:.acetaminophen **OR** acetaminophen, ALPRAZolam, labetalol, morphine injection, ondansetron (ZOFRAN) IV, oxyCODONE  ASSESMENT:     Colonic ileus.  Chronic constipation.      Ct w non-specific peri-colonic fat stranding, leukocytosis.  Day 3 IV flagyl, cipro.       L spine surgery 8/2.      Normocytic anemia.     PLAN      KUB ordered.  Will fup.      Switch PPI to po. Leave clears in place.       Attending MD has added prn oxycodone as alternative to Morphine, however Morphin still available.    Addendum 12:02:  KUB reading:  IMPRESSION: 1. Improving colonic ileus. 2. Possible increased wall thickening involving the redundant sigmoid colon, which could reflect colitis.    Azucena Freed  07/27/2022, 8:31 AM Phone [phone]

## 2022-07-27 NOTE — Progress Notes (Signed)
Subjective: I did not know the patient was admitted until this morning.  I saw her this morning approximately 0800.  She is alert and pleasant.  She has no complaints regarding her back.  She feels her abdomen is getting better.  Objective: Vital signs in last 24 hours: Temp:  [98 F (36.7 C)-98.3 F (36.8 C)] 98.1 F (36.7 C) (08/09 0749) Pulse Rate:  [107-118] 108 (08/09 0749) Resp:  [17-18] 17 (08/09 0749) BP: (131-150)/(68-79) 131/68 (08/09 0749) SpO2:  [90 %-93 %] 90 % (08/09 0749) Estimated body mass index is 36.76 kg/m as calculated from the following:   Height as of this encounter: '4\' 11"'$  (1.499 m).   Weight as of this encounter: 82.6 kg.   Intake/Output from previous day: 08/08 0701 - 08/09 0700 In: 707.5 [P.O.:320; I.V.:387.5] Out: -  Intake/Output this shift: Total I/O In: 240 [P.O.:240] Out: -   Physical exam the patient is alert and pleasant.  She is in no apparent distress.  Her wound is healing well.  Her strength is normal.  I reviewed the patient's CT of the abdomen pelvis only as it pertains to her spine.  There is quite a bit of metal artifact at L4-5 but I do not see any complicating features.  Lab Results: Recent Labs    07/26/22 0208 07/27/22 0136  WBC 17.0* 19.2*  HGB 9.4* 9.6*  HCT 29.7* 29.9*  PLT 360 416*   BMET Recent Labs    07/26/22 0208 07/27/22 0136  NA 134* 135  K 3.9 3.8  CL 102 99  CO2 21* 24  GLUCOSE 118* 113*  BUN 13 9  CREATININE 1.19* 1.13*  CALCIUM 8.4* 8.5*    Studies/Results: DG Abd 1 View  Result Date: 07/26/2022 CLINICAL DATA:  Ileus. EXAM: ABDOMEN - 1 VIEW COMPARISON:  07/24/2022 and CT 07/24/2022. FINDINGS: There continues to be gas-filled loops of throughout the abdomen. Most dilated loops of bowel continue to be the colon. Postsurgical changes in lower lumbar spine. Residual high-density material in the urinary bladder compatible with previous contrast administration. Limited evaluation of the lung bases.  Difficult to exclude basilar chest disease. Limited evaluation for free air on the supine images. IMPRESSION: Persistent gas-filled loops of bowel throughout the abdomen and pelvis with distension of the colon. Findings are similar to the recent comparison examinations and could represent an ileus. Electronically Signed   By: Markus Daft M.D.   On: 07/26/2022 07:33    Assessment/Plan: Postop day #7: The patient is doing well neurologically.  Ileus: She seems to be improving.  LOS: 2 days     Sara Mcdonald 07/27/2022, 11:42 AM     Patient ID: Sara Mcdonald, female   DOB: 1946/05/07, 76 y.o.   MRN: [id number]

## 2022-07-27 NOTE — Progress Notes (Signed)
PROGRESS NOTE    Sara Mcdonald  QIO:[id number] DOB: 1946/06/12 DOA: 07/24/2022 PCP: [name], MD   Brief Narrative: Sara Mcdonald is a 76 y.o. female with a history of diabetes mellitus type 2, fibromyalgia, GERD, sleep apnea, recent L4-L5 decompression surgery, depression, anxiety. Patient presented secondary to abdominal pain with bowel movements with concern for ileus vs inflammatory colitis seen on CT imaging. GI consulted. Having BMs   Assessment and Plan:  Abdominal pain Concern for postoperative colonic ileus vs inflammatory colitis. History of blood per rectum. Patient has been on opiates for her post-operative pain management. GI consulted.  -Follow-up GI recommendations: Clear liquid diet, s/p Relistor -Morphine PRN, minimize as able -Continue empiric Ciprofloxacin and Flagyl empirically Avoid pain meds-- use heat  History of L4-L5 decompression Recent surgery. Neurosurgery added to treatment team  Diabetes mellitus, type 2 Patient is on metformin as an outpatient, which was held. -Continue SSI  Primary hypertension Patient is on olmesartan and Maxide as an outpatient which were held secondary to NPO status -Resume olmesartan (irbesartan while inpatient)  Anxiety Depression Patient is on Xanax and Effexor as an outpatient which were held while patient is NPO. Awaiting medication history verification.  Hyperlipidemia Pravastatin held on admission  CKD stage IIIa Creatinine is stable.  Obesity Estimated body mass index is 36.76 kg/m as calculated from the following:   Height as of this encounter: '4\' 11"'$  (1.499 m).   Weight as of this encounter: 82.6 kg.     DVT prophylaxis: SCDs Code Status:   Code Status: Full Code Family Communication: None at bedside Disposition Plan: Discharge home likely in 2-3 days pending improvement of ileus, GI evaluation and recommendations and advancement of diet   Consultants:  [facility]  Procedures:  None  Antimicrobials: Ciprofloxacin Flagyl    Subjective: Having BMs some pain in lower abd  Objective: BP 131/68 (BP Location: Right Arm)   Pulse (!) 108   Temp 98.1 F (36.7 C) (Oral)   Resp 17   Ht '4\' 11"'$  (1.499 m)   Wt 82.6 kg   SpO2 90%   BMI 36.76 kg/m   Examination:   General: Appearance:    Obese female in no acute distress     Lungs:     respirations unlabored  Heart:    Tachycardic.   MS:   All extremities are intact.   Neurologic:   Awake, alert      Data Reviewed: I have personally reviewed following labs and imaging studies  CBC Lab Results  Component Value Date   WBC 19.2 (H) 07/27/2022   RBC 3.21 (L) 07/27/2022   HGB 9.6 (L) 07/27/2022   HCT 29.9 (L) 07/27/2022   MCV 93.1 07/27/2022   MCH 29.9 07/27/2022   PLT 416 (H) 07/27/2022   MCHC 32.1 07/27/2022   RDW 14.1 07/27/2022   LYMPHSABS 0.4 (L) 03/17/2017   MONOABS 0.6 03/17/2017   EOSABS 0.1 03/17/2017   BASOSABS 0.0 32/44/0102     Last metabolic panel Lab Results  Component Value Date   NA 135 07/27/2022   K 3.8 07/27/2022   CL 99 07/27/2022   CO2 24 07/27/2022   BUN 9 07/27/2022   CREATININE 1.13 (H) 07/27/2022   GLUCOSE 113 (H) 07/27/2022   GFRNONAA 50 (L) 07/27/2022   GFRAA 44 (L) 09/07/2020   CALCIUM 8.5 (L) 07/27/2022   PROT 5.5 (L) 07/25/2022   ALBUMIN 2.5 (L) 07/25/2022   BILITOT 0.7 07/25/2022   ALKPHOS 63  07/25/2022   AST 19 07/25/2022   ALT 11 07/25/2022   ANIONGAP 12 07/27/2022    GFR: Estimated Creatinine Clearance: 39.4 mL/min (A) (by C-G formula based on SCr of 1.13 mg/dL (H)).  Recent Results (from the past 240 hour(s))  C Difficile Quick Screen w PCR reflex     Status: None   Collection Time: 07/25/22  1:56 AM   Specimen: STOOL  Result Value Ref Range Status   C Diff antigen NEGATIVE NEGATIVE Final   C Diff toxin NEGATIVE NEGATIVE Final   C Diff interpretation No C. difficile detected.  Final    Comment: Performed  at [facility], [place] [address], [place], [place] [address]      Radiology Studies: DG Abd 1 View  Result Date: 07/27/2022 CLINICAL DATA:  Abdominal pain. EXAM: ABDOMEN - 1 VIEW COMPARISON:  Abdominal x-ray from yesterday. FINDINGS: Diffuse colonic distention has mildly improved since yesterday. There is a suggestion of increased wall thickening involving the redundant sigmoid colon. No small bowel dilatation. No acute osseous abnormality. IMPRESSION: 1. Improving colonic ileus. 2. Possible increased wall thickening involving the redundant sigmoid colon, which could reflect colitis. Electronically Signed   By: Titus Dubin M.D.   On: 07/27/2022 11:59   DG Abd 1 View  Result Date: 07/26/2022 CLINICAL DATA:  Ileus. EXAM: ABDOMEN - 1 VIEW COMPARISON:  07/24/2022 and CT 07/24/2022. FINDINGS: There continues to be gas-filled loops of throughout the abdomen. Most dilated loops of bowel continue to be the colon. Postsurgical changes in lower lumbar spine. Residual high-density material in the urinary bladder compatible with previous contrast administration. Limited evaluation of the lung bases. Difficult to exclude basilar chest disease. Limited evaluation for free air on the supine images. IMPRESSION: Persistent gas-filled loops of bowel throughout the abdomen and pelvis with distension of the colon. Findings are similar to the recent comparison examinations and could represent an ileus. Electronically Signed   By: Markus Daft M.D.   On: 07/26/2022 07:33      LOS: 2 days    Eulogio Bear DO [facility] 07/27/2022, 12:21 PM   If 7PM-7AM, please contact night-coverage [email or web address]

## 2022-07-28 ENCOUNTER — Inpatient Hospital Stay (HOSPITAL_COMMUNITY): Payer: PPO

## 2022-07-28 DIAGNOSIS — K567 Ileus, unspecified: Secondary | ICD-10-CM | POA: Diagnosis not present

## 2022-07-28 LAB — BASIC METABOLIC PANEL
Anion gap: 10 (ref 5–15)
BUN: 8 mg/dL (ref 8–23)
CO2: 21 mmol/L — ABNORMAL LOW (ref 22–32)
Calcium: 8.3 mg/dL — ABNORMAL LOW (ref 8.9–10.3)
Chloride: 103 mmol/L (ref 98–111)
Creatinine, Ser: 0.98 mg/dL (ref 0.44–1.00)
GFR, Estimated: 60 mL/min — ABNORMAL LOW (ref >60)
Glucose, Bld: 133 mg/dL — ABNORMAL HIGH (ref 70–99)
Potassium: 3.1 mmol/L — ABNORMAL LOW (ref 3.5–5.1)
Sodium: 134 mmol/L — ABNORMAL LOW (ref 135–145)

## 2022-07-28 LAB — GLUCOSE, CAPILLARY
Glucose-Capillary: 135 mg/dL — ABNORMAL HIGH (ref 70–99)
Glucose-Capillary: 125 mg/dL — ABNORMAL HIGH (ref 70–99)
Glucose-Capillary: 201 mg/dL — ABNORMAL HIGH (ref 70–99)

## 2022-07-28 LAB — CBC
HCT: 32.8 % — ABNORMAL LOW (ref 36.0–46.0)
Hemoglobin: 10.8 g/dL — ABNORMAL LOW (ref 12.0–15.0)
MCH: 30.1 pg (ref 26.0–34.0)
MCHC: 32.9 g/dL (ref 30.0–36.0)
MCV: 91.4 fL (ref 80.0–100.0)
Platelets: 348 10*3/uL (ref 150–400)
RBC: 3.59 MIL/uL — ABNORMAL LOW (ref 3.87–5.11)
RDW: 14.1 % (ref 11.5–15.5)
WBC: 14.5 10*3/uL — ABNORMAL HIGH (ref 4.0–10.5)
nRBC: 0 % (ref 0.0–0.2)

## 2022-07-28 LAB — MAGNESIUM: Magnesium: 1.7 mg/dL (ref 1.7–2.4)

## 2022-07-28 MED ORDER — FUROSEMIDE 10 MG/ML IJ SOLN
20.0000 mg | Freq: Once | INTRAMUSCULAR | Status: AC
  Administered 2022-07-28: 20 mg via INTRAVENOUS
  Filled 2022-07-28: qty 2

## 2022-07-28 MED ORDER — TRAZODONE HCL 100 MG PO TABS
100.0000 mg | ORAL_TABLET | Freq: Every day | ORAL | Status: DC
  Administered 2022-07-28 – 2022-08-03 (×7): 100 mg via ORAL
  Filled 2022-07-28 (×7): qty 1

## 2022-07-28 MED ORDER — TOPIRAMATE 100 MG PO TABS
100.0000 mg | ORAL_TABLET | Freq: Every day | ORAL | Status: DC
  Administered 2022-07-28 – 2022-08-03 (×7): 100 mg via ORAL
  Filled 2022-07-28 (×8): qty 1

## 2022-07-28 MED ORDER — MAGNESIUM SULFATE 2 GM/50ML IV SOLN
2.0000 g | Freq: Once | INTRAVENOUS | Status: AC
Start: 2022-07-28 — End: 2022-07-28
  Administered 2022-07-28: 2 g via INTRAVENOUS
  Filled 2022-07-28: qty 50

## 2022-07-28 MED ORDER — ALBUTEROL SULFATE (2.5 MG/3ML) 0.083% IN NEBU
2.5000 mg | INHALATION_SOLUTION | RESPIRATORY_TRACT | Status: DC | PRN
  Administered 2022-07-28: 2.5 mg via RESPIRATORY_TRACT
  Filled 2022-07-28: qty 3

## 2022-07-28 MED ORDER — POLYETHYLENE GLYCOL 3350 17 G PO PACK
17.0000 g | PACK | Freq: Every day | ORAL | Status: DC
  Administered 2022-07-28: 17 g via ORAL
  Filled 2022-07-28 (×2): qty 1

## 2022-07-28 MED ORDER — POTASSIUM CHLORIDE CRYS ER 20 MEQ PO TBCR
40.0000 meq | EXTENDED_RELEASE_TABLET | Freq: Once | ORAL | Status: AC
  Administered 2022-07-28: 40 meq via ORAL
  Filled 2022-07-28: qty 2

## 2022-07-28 MED ORDER — VENLAFAXINE HCL ER 75 MG PO CP24
75.0000 mg | ORAL_CAPSULE | Freq: Every day | ORAL | Status: DC
  Administered 2022-07-28 – 2022-08-04 (×8): 75 mg via ORAL
  Filled 2022-07-28 (×8): qty 1

## 2022-07-28 MED ORDER — SORBITOL 70 % SOLN
960.0000 mL | TOPICAL_OIL | Freq: Once | ORAL | Status: AC
  Administered 2022-07-28: 960 mL via RECTAL
  Filled 2022-07-28: qty 473

## 2022-07-28 NOTE — Progress Notes (Signed)
Subjective: The patient is alert and pleasant.    Objective: Vital signs in last 24 hours: Temp:  [97.5 F (36.4 C)-98.6 F (37 C)] 97.7 F (36.5 C) (08/10 0951) Pulse Rate:  [96-117] 114 (08/10 0951) Resp:  [17-20] 17 (08/10 0951) BP: (145-173)/(63-85) 173/83 (08/10 0951) SpO2:  [91 %-98 %] 95 % (08/10 0951) Estimated body mass index is 36.76 kg/m as calculated from the following:   Height as of this encounter: '4\' 11"'$  (1.499 m).   Weight as of this encounter: 82.6 kg.   Intake/Output from previous day: 08/09 0701 - 08/10 0700 In: 1640.8 [P.O.:240; IV Piggyback:1400.8] Out: -  Intake/Output this shift: No intake/output data recorded.  Physical exam the patient is alert and pleasant.  Her lower extremity strength is normal.  Her abdomen is protuberant.  Lab Results: Recent Labs    07/27/22 0136 07/28/22 0156  WBC 19.2* 14.5*  HGB 9.6* 10.8*  HCT 29.9* 32.8*  PLT 416* 348   BMET Recent Labs    07/27/22 0136 07/28/22 0156  NA 135 134*  K 3.8 3.1*  CL 99 103  CO2 24 21*  GLUCOSE 113* 133*  BUN 9 8  CREATININE 1.13* 0.98  CALCIUM 8.5* 8.3*    Studies/Results: DG Abd 1 View  Result Date: 07/28/2022 CLINICAL DATA:  Concern for ileus. EXAM: ABDOMEN - 1 VIEW COMPARISON:  07/27/2022 FINDINGS: There is persistent dilatation of large bowel loops. Dilatation of the transverse and ascending colon are slightly improved. There is a persistent loop of large bowel in the LEFT central abdomen, likely LOWER descending colon which appears more distended compared to prior studies. Question of wall thickening and loss of calcium markings of this loop. IMPRESSION: Persistent but slightly improved dilatation of large bowel loops. Question thickened wall of the LOWER descending colon loop in the LEFT central abdomen. Consider follow-up CT exam if needed. Electronically Signed   By: Nolon Nations M.D.   On: 07/28/2022 08:23   DG Abd 1 View  Result Date: 07/27/2022 CLINICAL DATA:   Abdominal pain. EXAM: ABDOMEN - 1 VIEW COMPARISON:  Abdominal x-ray from yesterday. FINDINGS: Diffuse colonic distention has mildly improved since yesterday. There is a suggestion of increased wall thickening involving the redundant sigmoid colon. No small bowel dilatation. No acute osseous abnormality. IMPRESSION: 1. Improving colonic ileus. 2. Possible increased wall thickening involving the redundant sigmoid colon, which could reflect colitis. Electronically Signed   By: Titus Dubin M.D.   On: 07/27/2022 11:59    Assessment/Plan: Status post lumbar fusion: The patient seems to be progressing well  Ileus: Hopefully resolving.  LOS: 3 days     [name] 07/28/2022, 11:17 AM     Patient ID: Sara Mcdonald, female   DOB: 04-15-1946, 76 y.o.   MRN: [id number]

## 2022-07-28 NOTE — Progress Notes (Signed)
Mobility Specialist - Progress Note   07/28/22 1556  Mobility  Activity Ambulated with assistance in hallway  Level of Assistance Contact guard assist, steadying assist  Assistive Device Front wheel walker  Distance Ambulated (ft) 200 ft  Activity Response Tolerated well  $Mobility charge 1 Mobility   Pt received in bed and agreeable to mobility. C/o pain. Pt to bed after session with all needs met.   Roderick Pee Mobility Specialist

## 2022-07-28 NOTE — Progress Notes (Addendum)
Daily Rounding Note  07/28/2022, 7:53 PM  LOS: 3 days   SUBJECTIVE:   Patient has had improvement in her ileus on her KUB. Has continued to pass liquid stools but the patient states that she does not think she is improving adequately. She has continued to ambulate and has tried to avoid using opioid pain medications. She has been relying primarily on Tylenol for pain control.  OBJECTIVE:         Vital signs in last 24 hours:    Temp:  [97.5 F (36.4 C)-97.9 F (36.6 C)] 97.7 F (36.5 C) (08/10 0951) Pulse Rate:  [96-116] 116 (08/10 1628) Resp:  [17-20] 17 (08/10 1628) BP: (145-173)/(63-90) 155/90 (08/10 1628) SpO2:  [91 %-99 %] 99 % (08/10 1628) Last BM Date : 07/26/22 Filed Weights   07/24/22 1438  Weight: 82.6 kg   General: Alert. Heart: Mildly tachycardiac Chest: No labored breathing or cough. Abdomen: Soft. Distended. Active bowel sounds. Extremities: No CCE. Neuro/Psych: Cooperative, pleasant, not confused.   Intake/Output from previous day: 08/09 0701 - 08/10 0700 In: 1640.8 [P.O.:240; IV Piggyback:1400.8] Out: -   Intake/Output this shift: No intake/output data recorded.  Lab Results: Recent Labs    07/26/22 0208 07/27/22 0136 07/28/22 0156  WBC 17.0* 19.2* 14.5*  HGB 9.4* 9.6* 10.8*  HCT 29.7* 29.9* 32.8*  PLT 360 416* 348   BMET Recent Labs    07/26/22 0208 07/27/22 0136 07/28/22 0156  NA 134* 135 134*  K 3.9 3.8 3.1*  CL 102 99 103  CO2 21* 24 21*  GLUCOSE 118* 113* 133*  BUN '13 9 8  '$ CREATININE 1.19* 1.13* 0.98  CALCIUM 8.4* 8.5* 8.3*   LFT No results for input(s): "PROT", "ALBUMIN", "AST", "ALT", "ALKPHOS", "BILITOT", "BILIDIR", "IBILI" in the last 72 hours.  PT/INR No results for input(s): "LABPROT", "INR" in the last 72 hours. Hepatitis Panel No results for input(s): "HEPBSAG", "HCVAB", "HEPAIGM", "HEPBIGM" in the last 72 hours.  Studies/Results: DG Abd 1 View  Result  Date: 07/28/2022 CLINICAL DATA:  Concern for ileus. EXAM: ABDOMEN - 1 VIEW COMPARISON:  07/27/2022 FINDINGS: There is persistent dilatation of large bowel loops. Dilatation of the transverse and ascending colon are slightly improved. There is a persistent loop of large bowel in the LEFT central abdomen, likely LOWER descending colon which appears more distended compared to prior studies. Question of wall thickening and loss of calcium markings of this loop. IMPRESSION: Persistent but slightly improved dilatation of large bowel loops. Question thickened wall of the LOWER descending colon loop in the LEFT central abdomen. Consider follow-up CT exam if needed. Electronically Signed   By: Nolon Nations M.D.   On: 07/28/2022 08:23   DG Abd 1 View  Result Date: 07/27/2022 CLINICAL DATA:  Abdominal pain. EXAM: ABDOMEN - 1 VIEW COMPARISON:  Abdominal x-ray from yesterday. FINDINGS: Diffuse colonic distention has mildly improved since yesterday. There is a suggestion of increased wall thickening involving the redundant sigmoid colon. No small bowel dilatation. No acute osseous abnormality. IMPRESSION: 1. Improving colonic ileus. 2. Possible increased wall thickening involving the redundant sigmoid colon, which could reflect colitis. Electronically Signed   By: Titus Dubin M.D.   On: 07/27/2022 11:59    Scheduled Meds:  acetaminophen  1,000 mg Oral TID   insulin aspart  0-9 Units Subcutaneous TID WC   irbesartan  150 mg Oral Daily   pantoprazole  40 mg Oral Q0600   polyethylene glycol  17 g Oral Daily   topiramate  100 mg Oral QHS   traZODone  100 mg Oral QHS   venlafaxine XR  75 mg Oral Daily   Continuous Infusions:  ciprofloxacin 400 mg (07/28/22 1905)   metronidazole 500 mg (07/28/22 1858)   PRN Meds:.albuterol, ALPRAZolam, labetalol, morphine injection, ondansetron (ZOFRAN) IV, oxyCODONE, traMADol  ASSESMENT:     Colonic ileus.  Chronic constipation.  Patient appears to be demonstrating  improvement in her ileus slowly over time. Continuing with SMOG enemas but patient has had difficulty tolerating them. S/p 1 dose of methylnaltrexone on 8/8.    Ct w non-specific peri-colonic fat stranding, leukocytosis.  Day 3 IV flagyl, cipro.       L spine surgery 8/2.      Normocytic anemia.     PLAN   KUB today shows improving ileus but does show thickened wall of the lower descending colon SMOG enema today Continue ambulation and avoiding narcotics if possible Keep K>4 and Mg>2 Plan for flexible sigmoidoscopy tomorrow in order to rule out stricture/mass and to evaluate thickening in the descending colon  Sharyn Creamer  07/28/2022, 7:53 PM Phone [phone]

## 2022-07-28 NOTE — Progress Notes (Signed)
PROGRESS NOTE    Sara Mcdonald  ION:[id number] DOB: 1946/01/14 DOA: 07/24/2022 PCP: [name], MD   Brief Narrative: Sara Mcdonald is a 76 y.o. female with a history of diabetes mellitus type 2, fibromyalgia, GERD, sleep apnea, recent L4-L5 decompression surgery, depression, anxiety. Patient presented secondary to abdominal pain with bowel movements with concern for ileus vs inflammatory colitis seen on CT imaging. GI consulted. Having BMs.   Assessment and Plan:  Abdominal pain Concern for postoperative colonic ileus vs inflammatory colitis. History of blood per rectum. Patient has been on opiates for her post-operative pain management. GI consulted.  -Follow-up GI recommendations: Clear liquid diet, s/p Relistor -Morphine PRN, minimize as able -Continue empiric Ciprofloxacin and Flagyl empirically Avoid pain meds-- use heat -bowel regimen- SMOG enema  History of L4-L5 decompression Recent surgery. Neurosurgery added to treatment team  Diabetes mellitus, type 2 Patient is on metformin as an outpatient, which was held. -Continue SSI  Primary hypertension -Resume olmesartan (irbesartan while inpatient)  Anxiety Depression Patient is on Xanax and Effexor   Hyperlipidemia Pravastatin held on admission  CKD stage IIIa Creatinine is stable.  Obesity Estimated body mass index is 36.76 kg/m as calculated from the following:   Height as of this encounter: '4\' 11"'$  (1.499 m).   Weight as of this encounter: 82.6 kg.     DVT prophylaxis: SCDs Code Status:   Code Status: Full Code Family Communication: None at bedside Disposition Plan: Discharge home likely in 2-3 days pending improvement of ileus, GI evaluation and recommendations and advancement of diet   Consultants:  [facility]    Subjective: Abdomen feels distended  Objective: BP (!) 173/83 (BP Location: Right Arm)   Pulse (!) 114   Temp 97.7 F (36.5 C) (Oral)   Resp 17   Ht 4'  11" (1.499 m)   Wt 82.6 kg   SpO2 95%   BMI 36.76 kg/m   Examination:    General: Appearance:    Obese female in no acute distress     Lungs:     respirations unlabored  Heart:    Tachycardic.   MS:   All extremities are intact.   Neurologic:   Awake, alert        Data Reviewed: I have personally reviewed following labs and imaging studies  CBC Lab Results  Component Value Date   WBC 14.5 (H) 07/28/2022   RBC 3.59 (L) 07/28/2022   HGB 10.8 (L) 07/28/2022   HCT 32.8 (L) 07/28/2022   MCV 91.4 07/28/2022   MCH 30.1 07/28/2022   PLT 348 07/28/2022   MCHC 32.9 07/28/2022   RDW 14.1 07/28/2022   LYMPHSABS 0.4 (L) 03/17/2017   MONOABS 0.6 03/17/2017   EOSABS 0.1 03/17/2017   BASOSABS 0.0 24/40/1027     Last metabolic panel Lab Results  Component Value Date   NA 134 (L) 07/28/2022   K 3.1 (L) 07/28/2022   CL 103 07/28/2022   CO2 21 (L) 07/28/2022   BUN 8 07/28/2022   CREATININE 0.98 07/28/2022   GLUCOSE 133 (H) 07/28/2022   GFRNONAA 60 (L) 07/28/2022   GFRAA 44 (L) 09/07/2020   CALCIUM 8.3 (L) 07/28/2022   PROT 5.5 (L) 07/25/2022   ALBUMIN 2.5 (L) 07/25/2022   BILITOT 0.7 07/25/2022   ALKPHOS 63 07/25/2022   AST 19 07/25/2022   ALT 11 07/25/2022   ANIONGAP 10 07/28/2022    GFR: Estimated Creatinine Clearance: 45.5 mL/min (by C-G formula based on SCr of  0.98 mg/dL).  Recent Results (from the past 240 hour(s))  C Difficile Quick Screen w PCR reflex     Status: None   Collection Time: 07/25/22  1:56 AM   Specimen: STOOL  Result Value Ref Range Status   C Diff antigen NEGATIVE NEGATIVE Final   C Diff toxin NEGATIVE NEGATIVE Final   C Diff interpretation No C. difficile detected.  Final    Comment: Performed at [facility], [place] [address], [place], [place] [address]      Radiology Studies: DG Abd 1 View  Result Date: 07/28/2022 CLINICAL DATA:  Concern for ileus. EXAM: ABDOMEN - 1 VIEW COMPARISON:  07/27/2022 FINDINGS: There is persistent  dilatation of large bowel loops. Dilatation of the transverse and ascending colon are slightly improved. There is a persistent loop of large bowel in the LEFT central abdomen, likely LOWER descending colon which appears more distended compared to prior studies. Question of wall thickening and loss of calcium markings of this loop. IMPRESSION: Persistent but slightly improved dilatation of large bowel loops. Question thickened wall of the LOWER descending colon loop in the LEFT central abdomen. Consider follow-up CT exam if needed. Electronically Signed   By: Nolon Nations M.D.   On: 07/28/2022 08:23   DG Abd 1 View  Result Date: 07/27/2022 CLINICAL DATA:  Abdominal pain. EXAM: ABDOMEN - 1 VIEW COMPARISON:  Abdominal x-ray from yesterday. FINDINGS: Diffuse colonic distention has mildly improved since yesterday. There is a suggestion of increased wall thickening involving the redundant sigmoid colon. No small bowel dilatation. No acute osseous abnormality. IMPRESSION: 1. Improving colonic ileus. 2. Possible increased wall thickening involving the redundant sigmoid colon, which could reflect colitis. Electronically Signed   By: Titus Dubin M.D.   On: 07/27/2022 11:59      LOS: 3 days    Eulogio Bear DO [facility] 07/28/2022, 12:13 PM   If 7PM-7AM, please contact night-coverage [email or web address]

## 2022-07-28 NOTE — Progress Notes (Signed)
Dear Doctor: This patient has been identified as a candidate for PICC for the following reason (s): IV therapy over 48 hours and poor veins/poor circulatory system (CHF, COPD, emphysema, diabetes, steroid use, IV drug abuse, etc.) If you agree, please write an order for the indicated device.   Thank you for supporting the early vascular access assessment program.

## 2022-07-28 NOTE — H&P (View-Only) (Signed)
Daily Rounding Note  07/28/2022, 7:53 PM  LOS: 3 days   SUBJECTIVE:   Patient has had improvement in her ileus on her KUB. Has continued to pass liquid stools but the patient states that she does not think she is improving adequately. She has continued to ambulate and has tried to avoid using opioid pain medications. She has been relying primarily on Tylenol for pain control.  OBJECTIVE:         Vital signs in last 24 hours:    Temp:  [97.5 F (36.4 C)-97.9 F (36.6 C)] 97.7 F (36.5 C) (08/10 0951) Pulse Rate:  [96-116] 116 (08/10 1628) Resp:  [17-20] 17 (08/10 1628) BP: (145-173)/(63-90) 155/90 (08/10 1628) SpO2:  [91 %-99 %] 99 % (08/10 1628) Last BM Date : 07/26/22 Filed Weights   07/24/22 1438  Weight: 82.6 kg   General: Alert. Heart: Mildly tachycardiac Chest: No labored breathing or cough. Abdomen: Soft. Distended. Active bowel sounds. Extremities: No CCE. Neuro/Psych: Cooperative, pleasant, not confused.   Intake/Output from previous day: 08/09 0701 - 08/10 0700 In: 1640.8 [P.O.:240; IV Piggyback:1400.8] Out: -   Intake/Output this shift: No intake/output data recorded.  Lab Results: Recent Labs    07/26/22 0208 07/27/22 0136 07/28/22 0156  WBC 17.0* 19.2* 14.5*  HGB 9.4* 9.6* 10.8*  HCT 29.7* 29.9* 32.8*  PLT 360 416* 348   BMET Recent Labs    07/26/22 0208 07/27/22 0136 07/28/22 0156  NA 134* 135 134*  K 3.9 3.8 3.1*  CL 102 99 103  CO2 21* 24 21*  GLUCOSE 118* 113* 133*  BUN '13 9 8  '$ CREATININE 1.19* 1.13* 0.98  CALCIUM 8.4* 8.5* 8.3*   LFT No results for input(s): "PROT", "ALBUMIN", "AST", "ALT", "ALKPHOS", "BILITOT", "BILIDIR", "IBILI" in the last 72 hours.  PT/INR No results for input(s): "LABPROT", "INR" in the last 72 hours. Hepatitis Panel No results for input(s): "HEPBSAG", "HCVAB", "HEPAIGM", "HEPBIGM" in the last 72 hours.  Studies/Results: DG Abd 1 View  Result  Date: 07/28/2022 CLINICAL DATA:  Concern for ileus. EXAM: ABDOMEN - 1 VIEW COMPARISON:  07/27/2022 FINDINGS: There is persistent dilatation of large bowel loops. Dilatation of the transverse and ascending colon are slightly improved. There is a persistent loop of large bowel in the LEFT central abdomen, likely LOWER descending colon which appears more distended compared to prior studies. Question of wall thickening and loss of calcium markings of this loop. IMPRESSION: Persistent but slightly improved dilatation of large bowel loops. Question thickened wall of the LOWER descending colon loop in the LEFT central abdomen. Consider follow-up CT exam if needed. Electronically Signed   By: Nolon Nations M.D.   On: 07/28/2022 08:23   DG Abd 1 View  Result Date: 07/27/2022 CLINICAL DATA:  Abdominal pain. EXAM: ABDOMEN - 1 VIEW COMPARISON:  Abdominal x-ray from yesterday. FINDINGS: Diffuse colonic distention has mildly improved since yesterday. There is a suggestion of increased wall thickening involving the redundant sigmoid colon. No small bowel dilatation. No acute osseous abnormality. IMPRESSION: 1. Improving colonic ileus. 2. Possible increased wall thickening involving the redundant sigmoid colon, which could reflect colitis. Electronically Signed   By: Titus Dubin M.D.   On: 07/27/2022 11:59    Scheduled Meds:  acetaminophen  1,000 mg Oral TID   insulin aspart  0-9 Units Subcutaneous TID WC   irbesartan  150 mg Oral Daily   pantoprazole  40 mg Oral Q0600   polyethylene glycol  17 g Oral Daily   topiramate  100 mg Oral QHS   traZODone  100 mg Oral QHS   venlafaxine XR  75 mg Oral Daily   Continuous Infusions:  ciprofloxacin 400 mg (07/28/22 1905)   metronidazole 500 mg (07/28/22 1858)   PRN Meds:.albuterol, ALPRAZolam, labetalol, morphine injection, ondansetron (ZOFRAN) IV, oxyCODONE, traMADol  ASSESMENT:     Colonic ileus.  Chronic constipation.  Patient appears to be demonstrating  improvement in her ileus slowly over time. Continuing with SMOG enemas but patient has had difficulty tolerating them. S/p 1 dose of methylnaltrexone on 8/8.    Ct w non-specific peri-colonic fat stranding, leukocytosis.  Day 3 IV flagyl, cipro.       L spine surgery 8/2.      Normocytic anemia.     PLAN   KUB today shows improving ileus but does show thickened wall of the lower descending colon SMOG enema today Continue ambulation and avoiding narcotics if possible Keep K>4 and Mg>2 Plan for flexible sigmoidoscopy tomorrow in order to rule out stricture/mass and to evaluate thickening in the descending colon  Sharyn Creamer  07/28/2022, 7:53 PM Phone [phone]

## 2022-07-29 ENCOUNTER — Inpatient Hospital Stay (HOSPITAL_COMMUNITY): Payer: PPO

## 2022-07-29 ENCOUNTER — Inpatient Hospital Stay (HOSPITAL_COMMUNITY): Payer: PPO | Admitting: Anesthesiology

## 2022-07-29 ENCOUNTER — Encounter (HOSPITAL_COMMUNITY): Payer: PPO

## 2022-07-29 ENCOUNTER — Encounter (HOSPITAL_COMMUNITY): Payer: Self-pay | Admitting: Internal Medicine

## 2022-07-29 ENCOUNTER — Inpatient Hospital Stay: Payer: Self-pay

## 2022-07-29 ENCOUNTER — Encounter (HOSPITAL_COMMUNITY)
Admission: EM | Disposition: A | Discharge status: Home Health | Payer: Self-pay | Source: Home / Self Care | Attending: Internal Medicine

## 2022-07-29 DIAGNOSIS — K6389 Other specified diseases of intestine: Secondary | ICD-10-CM

## 2022-07-29 DIAGNOSIS — D123 Benign neoplasm of transverse colon: Secondary | ICD-10-CM | POA: Diagnosis not present

## 2022-07-29 DIAGNOSIS — K639 Disease of intestine, unspecified: Secondary | ICD-10-CM

## 2022-07-29 DIAGNOSIS — K567 Ileus, unspecified: Secondary | ICD-10-CM | POA: Diagnosis not present

## 2022-07-29 DIAGNOSIS — K5730 Diverticulosis of large intestine without perforation or abscess without bleeding: Secondary | ICD-10-CM

## 2022-07-29 DIAGNOSIS — K5900 Constipation, unspecified: Secondary | ICD-10-CM

## 2022-07-29 DIAGNOSIS — K641 Second degree hemorrhoids: Secondary | ICD-10-CM

## 2022-07-29 DIAGNOSIS — K635 Polyp of colon: Secondary | ICD-10-CM | POA: Diagnosis not present

## 2022-07-29 DIAGNOSIS — M7989 Other specified soft tissue disorders: Secondary | ICD-10-CM

## 2022-07-29 DIAGNOSIS — J449 Chronic obstructive pulmonary disease, unspecified: Secondary | ICD-10-CM

## 2022-07-29 HISTORY — PX: FLEXIBLE SIGMOIDOSCOPY: SHX5431

## 2022-07-29 HISTORY — PX: BIOPSY: SHX5522

## 2022-07-29 HISTORY — PX: POLYPECTOMY: SHX5525

## 2022-07-29 LAB — BASIC METABOLIC PANEL
Anion gap: 8 (ref 5–15)
Anion gap: 7 (ref 5–15)
BUN: 7 mg/dL — ABNORMAL LOW (ref 8–23)
BUN: 7 mg/dL — ABNORMAL LOW (ref 8–23)
CO2: 24 mmol/L (ref 22–32)
CO2: 25 mmol/L (ref 22–32)
Calcium: 8 mg/dL — ABNORMAL LOW (ref 8.9–10.3)
Calcium: 8 mg/dL — ABNORMAL LOW (ref 8.9–10.3)
Chloride: 102 mmol/L (ref 98–111)
Chloride: 103 mmol/L (ref 98–111)
Creatinine, Ser: 1.02 mg/dL — ABNORMAL HIGH (ref 0.44–1.00)
Creatinine, Ser: 0.98 mg/dL (ref 0.44–1.00)
GFR, Estimated: 57 mL/min — ABNORMAL LOW (ref >60)
GFR, Estimated: 60 mL/min — ABNORMAL LOW (ref >60)
Glucose, Bld: 149 mg/dL — ABNORMAL HIGH (ref 70–99)
Glucose, Bld: 121 mg/dL — ABNORMAL HIGH (ref 70–99)
Potassium: 2.9 mmol/L — ABNORMAL LOW (ref 3.5–5.1)
Potassium: 3.3 mmol/L — ABNORMAL LOW (ref 3.5–5.1)
Sodium: 134 mmol/L — ABNORMAL LOW (ref 135–145)
Sodium: 135 mmol/L (ref 135–145)

## 2022-07-29 LAB — CBC
HCT: 36.2 % (ref 36.0–46.0)
HCT: 31.6 % — ABNORMAL LOW (ref 36.0–46.0)
Hemoglobin: 11.5 g/dL — ABNORMAL LOW (ref 12.0–15.0)
Hemoglobin: 10.3 g/dL — ABNORMAL LOW (ref 12.0–15.0)
MCH: 29.1 pg (ref 26.0–34.0)
MCH: 30.1 pg (ref 26.0–34.0)
MCHC: 31.8 g/dL (ref 30.0–36.0)
MCHC: 32.6 g/dL (ref 30.0–36.0)
MCV: 91.6 fL (ref 80.0–100.0)
MCV: 92.4 fL (ref 80.0–100.0)
Platelets: 431 10*3/uL — ABNORMAL HIGH (ref 150–400)
Platelets: 324 10*3/uL (ref 150–400)
RBC: 3.95 MIL/uL (ref 3.87–5.11)
RBC: 3.42 MIL/uL — ABNORMAL LOW (ref 3.87–5.11)
RDW: 13.9 % (ref 11.5–15.5)
RDW: 14.1 % (ref 11.5–15.5)
WBC: 13.5 10*3/uL — ABNORMAL HIGH (ref 4.0–10.5)
WBC: 10.1 10*3/uL (ref 4.0–10.5)
nRBC: 0 % (ref 0.0–0.2)
nRBC: 0 % (ref 0.0–0.2)

## 2022-07-29 LAB — BLOOD GAS, VENOUS
Acid-base deficit: 0.4 mmol/L (ref 0.0–2.0)
Bicarbonate: 25.4 mmol/L (ref 20.0–28.0)
Drawn by: 47144
O2 Saturation: 74.1 %
Patient temperature: 36.9
pCO2, Ven: 45 mmHg (ref 44–60)
pH, Ven: 7.36 (ref 7.25–7.43)
pO2, Ven: 45 mmHg (ref 32–45)

## 2022-07-29 LAB — D-DIMER, QUANTITATIVE: D-Dimer, Quant: 5.18 ug/mL-FEU — ABNORMAL HIGH (ref 0.00–0.50)

## 2022-07-29 LAB — GLUCOSE, CAPILLARY
Glucose-Capillary: 114 mg/dL — ABNORMAL HIGH (ref 70–99)
Glucose-Capillary: 111 mg/dL — ABNORMAL HIGH (ref 70–99)
Glucose-Capillary: 107 mg/dL — ABNORMAL HIGH (ref 70–99)
Glucose-Capillary: 148 mg/dL — ABNORMAL HIGH (ref 70–99)
Glucose-Capillary: 110 mg/dL — ABNORMAL HIGH (ref 70–99)

## 2022-07-29 LAB — BRAIN NATRIURETIC PEPTIDE: B Natriuretic Peptide: 179.3 pg/mL — ABNORMAL HIGH (ref 0.0–100.0)

## 2022-07-29 LAB — TSH: TSH: 2.967 u[IU]/mL (ref 0.350–4.500)

## 2022-07-29 LAB — MAGNESIUM: Magnesium: 1.7 mg/dL (ref 1.7–2.4)

## 2022-07-29 SURGERY — SIGMOIDOSCOPY, FLEXIBLE
Anesthesia: Monitor Anesthesia Care

## 2022-07-29 MED ORDER — IRBESARTAN 75 MG PO TABS
75.0000 mg | ORAL_TABLET | Freq: Every day | ORAL | Status: DC
  Administered 2022-07-30 – 2022-07-31 (×2): 75 mg via ORAL
  Filled 2022-07-29 (×2): qty 1

## 2022-07-29 MED ORDER — LIDOCAINE 2% (20 MG/ML) 5 ML SYRINGE
INTRAMUSCULAR | Status: DC | PRN
  Administered 2022-07-29: 60 mg via INTRAVENOUS

## 2022-07-29 MED ORDER — IOHEXOL 350 MG/ML SOLN
44.0000 mL | Freq: Once | INTRAVENOUS | Status: AC | PRN
  Administered 2022-07-29: 44 mL via INTRAVENOUS

## 2022-07-29 MED ORDER — SODIUM CHLORIDE 0.9% FLUSH
10.0000 mL | Freq: Two times a day (BID) | INTRAVENOUS | Status: DC
  Administered 2022-07-31 – 2022-08-03 (×5): 10 mL

## 2022-07-29 MED ORDER — PROPOFOL 500 MG/50ML IV EMUL
INTRAVENOUS | Status: DC | PRN
  Administered 2022-07-29: 150 ug/kg/min via INTRAVENOUS

## 2022-07-29 MED ORDER — ALBUTEROL SULFATE (2.5 MG/3ML) 0.083% IN NEBU
INHALATION_SOLUTION | RESPIRATORY_TRACT | Status: AC
  Filled 2022-07-29: qty 3

## 2022-07-29 MED ORDER — LEVALBUTEROL HCL 0.63 MG/3ML IN NEBU
0.6300 mg | INHALATION_SOLUTION | Freq: Four times a day (QID) | RESPIRATORY_TRACT | Status: DC | PRN
  Administered 2022-07-29 – 2022-07-31 (×3): 0.63 mg via RESPIRATORY_TRACT
  Filled 2022-07-29 (×4): qty 3

## 2022-07-29 MED ORDER — POTASSIUM CHLORIDE 10 MEQ/100ML IV SOLN
10.0000 meq | INTRAVENOUS | Status: AC
  Administered 2022-07-29 (×3): 10 meq via INTRAVENOUS
  Filled 2022-07-29 (×4): qty 100

## 2022-07-29 MED ORDER — SODIUM CHLORIDE 0.9% FLUSH
10.0000 mL | INTRAVENOUS | Status: DC | PRN
  Administered 2022-08-04: 20 mL

## 2022-07-29 MED ORDER — METOPROLOL TARTRATE 5 MG/5ML IV SOLN
5.0000 mg | Freq: Once | INTRAVENOUS | Status: AC
  Administered 2022-07-29: 5 mg via INTRAVENOUS
  Filled 2022-07-29: qty 5

## 2022-07-29 MED ORDER — CHLORHEXIDINE GLUCONATE CLOTH 2 % EX PADS
6.0000 | MEDICATED_PAD | Freq: Every day | CUTANEOUS | Status: DC
  Administered 2022-07-29 – 2022-08-04 (×7): 6 via TOPICAL

## 2022-07-29 MED ORDER — PROPOFOL 10 MG/ML IV BOLUS
INTRAVENOUS | Status: DC | PRN
  Administered 2022-07-29: 20 mg via INTRAVENOUS

## 2022-07-29 MED ORDER — POTASSIUM CHLORIDE CRYS ER 20 MEQ PO TBCR
40.0000 meq | EXTENDED_RELEASE_TABLET | Freq: Once | ORAL | Status: AC
  Administered 2022-07-29: 40 meq via ORAL
  Filled 2022-07-29: qty 2

## 2022-07-29 MED ORDER — FUROSEMIDE 10 MG/ML IJ SOLN
20.0000 mg | Freq: Once | INTRAMUSCULAR | Status: AC
  Administered 2022-07-29: 20 mg via INTRAVENOUS
  Filled 2022-07-29: qty 2

## 2022-07-29 MED ORDER — PHENYLEPHRINE 80 MCG/ML (10ML) SYRINGE FOR IV PUSH (FOR BLOOD PRESSURE SUPPORT)
PREFILLED_SYRINGE | INTRAVENOUS | Status: DC | PRN
  Administered 2022-07-29: 160 ug via INTRAVENOUS

## 2022-07-29 MED ORDER — MAGNESIUM SULFATE 2 GM/50ML IV SOLN
2.0000 g | Freq: Once | INTRAVENOUS | Status: AC
  Administered 2022-07-29: 2 g via INTRAVENOUS
  Filled 2022-07-29: qty 50

## 2022-07-29 MED ORDER — METOPROLOL TARTRATE 12.5 MG HALF TABLET
12.5000 mg | ORAL_TABLET | Freq: Two times a day (BID) | ORAL | Status: DC
  Administered 2022-07-29 (×3): 12.5 mg via ORAL
  Filled 2022-07-29 (×4): qty 1

## 2022-07-29 MED ORDER — ALBUTEROL SULFATE (2.5 MG/3ML) 0.083% IN NEBU
2.5000 mg | INHALATION_SOLUTION | Freq: Four times a day (QID) | RESPIRATORY_TRACT | Status: DC | PRN
  Administered 2022-07-29: 2.5 mg via RESPIRATORY_TRACT

## 2022-07-29 MED ORDER — POLYETHYLENE GLYCOL 3350 17 G PO PACK
17.0000 g | PACK | Freq: Two times a day (BID) | ORAL | Status: DC
Start: 2022-07-29 — End: 2022-08-02
  Administered 2022-07-29 – 2022-08-02 (×8): 17 g via ORAL
  Filled 2022-07-29 (×8): qty 1

## 2022-07-29 MED ORDER — POTASSIUM CHLORIDE 10 MEQ/100ML IV SOLN
10.0000 meq | Freq: Once | INTRAVENOUS | Status: AC
  Administered 2022-07-29: 10 meq via INTRAVENOUS
  Filled 2022-07-29: qty 100

## 2022-07-29 MED ORDER — LACTATED RINGERS IV SOLN
INTRAVENOUS | Status: AC | PRN
  Administered 2022-07-29: 20 mL/h via INTRAVENOUS

## 2022-07-29 NOTE — Op Note (Signed)
[facility] Patient Name: Sara Mcdonald Procedure Date : 07/29/2022 MRN: [id number] Attending MD: Gerrit Heck , MD Date of Birth: 1946-01-10 CSN: [id number] Age: 76 Admit Type: Inpatient Procedure:                Flexible Sigmoidoscopy Indications:              Abnormal abdominal x-ray of the GI tract, Colonic                            Ileus Providers:                Gerrit Heck, MD, Dulcy Fanny, Luan Moore, Technician, Hedy Camara Referring MD:              Medicines:                Monitored Anesthesia Care Complications:            No immediate complications. Estimated Blood Loss:     Estimated blood loss was minimal. Procedure:                Pre-Anesthesia Assessment:                           - Prior to the procedure, a History and Physical                            was performed, and patient medications and                            allergies were reviewed. The patient's tolerance of                            previous anesthesia was also reviewed. The risks                            and benefits of the procedure and the sedation                            options and risks were discussed with the patient.                            All questions were answered, and informed consent                            was obtained. Prior Anticoagulants: The patient has                            taken no previous anticoagulant or antiplatelet                            agents. ASA Grade Assessment: III - A patient with  severe systemic disease. After reviewing the risks                            and benefits, the patient was deemed in                            satisfactory condition to undergo the procedure.                           After obtaining informed consent, the scope was                            passed under direct vision. The PCF-HQ190L                            ([id number] Olympus  colonoscope was introduced                            through the anus and advanced to the the left                            transverse colon. The flexible sigmoidoscopy was                            accomplished without difficulty. The patient                            tolerated the procedure well. The quality of the                            bowel preparation was adequate. Scope In: Scope Out: Findings:      Hemorrhoids were found on perianal exam.      Multiple small and large-mouthed diverticula were found in the sigmoid       colon.      Patchy areas of moderately erythematous mucosa was found in the sigmoid       colon and in the distal descending colon. Biopsies were taken with a       cold forceps for histology. Estimated blood loss was minimal.      Non-bleeding internal hemorrhoids were found during retroflexion. The       hemorrhoids were small and Grade II (internal hemorrhoids that prolapse       but reduce spontaneously).      A 4 mm polyp was found in the transverse colon. The polyp was sessile.       The polyp was removed with a cold snare. Resection and retrieval were       complete. Estimated blood loss was minimal. Impression:               - Hemorrhoids found on perianal exam.                           - Diverticulosis in the sigmoid colon.                           - Erythematous mucosa in the sigmoid colon  and in                            the distal descending colon. Biopsied.                           - Non-bleeding internal hemorrhoids.                           - One 4 mm polyp in the transverse colon, removed                            with a cold snare. Resected and retrieved.                           - After examination to the rectum, the endoscope                            was re-advanced to the transverse colon and fully                            desufflated. Recommendation:           - Return patient to hospital ward for ongoing care.                            - Advance diet as tolerated.                           - Continue present medications.                           - Await pathology results. Procedure Code(s):        --- Professional ---                           [id number], Sigmoidoscopy, flexible; with removal of                            tumor(s), polyp(s), or other lesion(s) by snare                            technique                           45331, 59, Sigmoidoscopy, flexible; with biopsy,                            single or multiple Diagnosis Code(s):        --- Professional ---                           K64.1, Second degree hemorrhoids                           K63.89, Other specified diseases of intestine  K63.5, Polyp of colon                           K59.00, Constipation, unspecified                           K57.30, Diverticulosis of large intestine without                            perforation or abscess without bleeding                           R93.3, Abnormal findings on diagnostic imaging of                            other parts of digestive tract CPT copyright 2019 [employer]. All rights reserved. The codes documented in this report are preliminary and upon coder review may  be revised to meet current compliance requirements. Gerrit Heck, MD 07/29/2022 9:48:33 AM Number of Addenda: 0

## 2022-07-29 NOTE — Progress Notes (Signed)
Peripherally Inserted Central Catheter Placement  The IV Nurse has discussed with the patient and/or persons authorized to consent for the patient, the purpose of this procedure and the potential benefits and risks involved with this procedure.  The benefits include less needle sticks, lab draws from the catheter, and the patient may be discharged home with the catheter. Risks include, but not limited to, infection, bleeding, blood clot (thrombus formation), and puncture of an artery; nerve damage and irregular heartbeat and possibility to perform a PICC exchange if needed/ordered by physician.  Alternatives to this procedure were also discussed.  Bard Power PICC patient education guide, fact sheet on infection prevention and patient information card has been provided to patient /or left at bedside.    PICC Placement Documentation  PICC Double Lumen 07/29/22 Right Brachial 36 cm 0 cm (Active)  Indication for Insertion or Continuance of Line Limited venous access - need for IV therapy >5 days (PICC only) 07/29/22 1850  Exposed Catheter (cm) 0 cm 07/29/22 1850  Site Assessment Clean, Dry, Intact 07/29/22 1850  Lumen #1 Status Flushed;Saline locked;Blood return noted 07/29/22 1850  Lumen #2 Status Flushed;Saline locked;Blood return noted 07/29/22 1850  Dressing Type Transparent;Securing device 07/29/22 1850  Dressing Status Antimicrobial disc in place;Clean, Dry, Intact 07/29/22 1850  Dressing Intervention New dressing 07/29/22 1850  Dressing Change Due 08/05/22 07/29/22 [place] 07/29/2022, 7:18 PM

## 2022-07-29 NOTE — Anesthesia Postprocedure Evaluation (Signed)
Anesthesia Post Note  Patient: Minervia Osso Pitta  Procedure(s) Performed: [place] BIOPSY     Patient location during evaluation: PACU Anesthesia Type: MAC Level of consciousness: awake and alert Pain management: pain level controlled Vital Signs Assessment: post-procedure vital signs reviewed and stable Respiratory status: spontaneous breathing, nonlabored ventilation, respiratory function stable and patient connected to nasal cannula oxygen Cardiovascular status: stable and blood pressure returned to baseline Postop Assessment: no apparent nausea or vomiting Anesthetic complications: no   No notable events documented.  Last Vitals:  Vitals:   07/29/22 1000 07/29/22 1040  BP: 110/67 (!) 143/84  Pulse: 96 95  Resp: 19 17  Temp: 36.5 C 36.8 C  SpO2: 95% 95%    Last Pain:  Vitals:   07/29/22 1040  TempSrc: Oral  PainSc:                  Taurus Willis

## 2022-07-29 NOTE — Progress Notes (Signed)
Mobility Specialist - Progress Note    07/29/22 1500  Mobility  Activity Ambulated with assistance in hallway  Range of Motion/Exercises Active  Level of Assistance Standby assist, set-up cues, supervision of patient - no hands on  Assistive Device Front wheel walker  Distance Ambulated (ft) 150 ft  Activity Response Tolerated well  Transport method Ambulatory  $Mobility charge 1 Mobility   Pt received in recliner w/ family in room. Pt agreeable to mobility. Mentioned feeling dizzy due to not eating all day. Pt left on [facility] w/ necessities met.   [facility]

## 2022-07-29 NOTE — Anesthesia Procedure Notes (Signed)
Procedure Name: MAC Date/Time: 07/29/2022 9:17 AM  Performed by: Rande Brunt, CRNAPre-anesthesia Checklist: Patient identified, Emergency Drugs available, Suction available and Patient being monitored Patient Re-evaluated:Patient Re-evaluated prior to induction Oxygen Delivery Method: Simple face mask Preoxygenation: Pre-oxygenation with 100% oxygen Induction Type: IV induction Placement Confirmation: positive ETCO2 and CO2 detector Dental Injury: Teeth and Oropharynx as per pre-operative assessment

## 2022-07-29 NOTE — Progress Notes (Addendum)
TRH cross cover note:  Messaged by RN regarding concern for somnolence, shortness of breath and tachycardia.  Also patient complaining of lower extremity edema.  Went to bedside with RN present.  Patient alert and oriented, placed on telemetry with notable A-fib with RVR with heart rate ranging up to 160 bpm.  No previous history of A-fib.  Patient currently n.p.o. awaiting flex sigmoidoscopy tomorrow.  Other than the dyspnea that started 2 days ago in which she was placed on 2 L nasal cannula, no other complaints.  Denies headache, no chest pain, no palpitations, no abdominal pain.  Physical Exam GEN: 76 yo female in NAD, alert and oriented x 3,  PULM: Breath sounds slight diminished bilateral bases, no wheezes, normal respiratory effort without accessory muscle use, on 2 L nasal cannula CV: Irregularly irregular rhythm w/o M/G/R GI: abd soft, NTND, NABS, no R/G/M MSK: 1-2+ pitting edema bilateral lower extremities to mid shin, moves all extremities independently  A/P:  Atrial fibrillation, new onset -- Check TSH, D-dimer, BNP, chest x-ray -- Start metoprolol tartrate 12.5 mg p.o. twice daily -- Update echo, last 2018 that was unrevealing -- Hold anticoagulation given planned procedure tomorrow -- Monitor on telemetry  Addendum: 07/29/2022 2:13 AM. D-dimer elevated greater than 5, potassium 2.6. --Replete potassium --Check magnesium level --Obtaining vascular duplex ultrasound bilateral lower extremities and CT angiogram chest to rule out DVT/PE

## 2022-07-29 NOTE — Progress Notes (Signed)
Report given to [facility] in Endo.

## 2022-07-29 NOTE — Progress Notes (Signed)
Subjective: The patient is alert and pleasant.  He feels her ileus is resolving.  She had an episode of A-fib last night.  Objective: Vital signs in last 24 hours: Temp:  [97.6 F (36.4 C)-98.4 F (36.9 C)] 97.6 F (36.4 C) (08/11 0554) Pulse Rate:  [69-166] 107 (08/11 0554) Resp:  [17-19] 19 (08/11 0554) BP: (112-173)/(68-91) 146/81 (08/11 0554) SpO2:  [94 %-99 %] 99 % (08/11 0554) Estimated body mass index is 36.76 kg/m as calculated from the following:   Height as of this encounter: '4\' 11"'$  (1.499 m).   Weight as of this encounter: 82.6 kg.   Intake/Output from previous day: 08/10 0701 - 08/11 0700 In: 864.8 [P.O.:240; IV Piggyback:624.8] Out: -  Intake/Output this shift: No intake/output data recorded.  Physical exam patient is alert and pleasant.  Her wound is healing well.  Her strength is normal.  Lab Results: Recent Labs    07/28/22 0156 07/29/22 0105  WBC 14.5* 13.5*  HGB 10.8* 11.5*  HCT 32.8* 36.2  PLT 348 431*   BMET Recent Labs    07/28/22 0156 07/29/22 0105  NA 134* 134*  K 3.1* 2.9*  CL 103 102  CO2 21* 24  GLUCOSE 133* 149*  BUN 8 7*  CREATININE 0.98 1.02*  CALCIUM 8.3* 8.0*    Studies/Results: DG CHEST PORT 1 VIEW  Result Date: 07/29/2022 CLINICAL DATA:  Shortness of breath with wheezing EXAM: PORTABLE CHEST 1 VIEW COMPARISON:  Chest CT from earlier today FINDINGS: Cardiomegaly and vascular pedicle widening. Bilateral pleural effusion and atelectasis. No pneumothorax. IMPRESSION: 1. Stable from chest CTA earlier today. 2. Atelectasis and pleural effusions. Electronically Signed   By: Jorje Guild M.D.   On: 07/29/2022 06:14   CT Angio Chest Pulmonary Embolism (PE) W or WO Contrast  Result Date: 07/29/2022 CLINICAL DATA:  Pulmonary embolism (PE) suspected, positive D-dimer EXAM: CT ANGIOGRAPHY CHEST WITH CONTRAST TECHNIQUE: Multidetector CT imaging of the chest was performed using the standard protocol during bolus administration of  intravenous contrast. Multiplanar CT image reconstructions and MIPs were obtained to evaluate the vascular anatomy. RADIATION DOSE REDUCTION: This exam was performed according to the departmental dose-optimization program which includes automated exposure control, adjustment of the mA and/or kV according to patient size and/or use of iterative reconstruction technique. CONTRAST:  73m OMNIPAQUE IOHEXOL 350 MG/ML SOLN COMPARISON:  No recent chest imaging.  Chest CT 02/01/2017 reviewed FINDINGS: Cardiovascular: There are no filling defects within the pulmonary arteries to suggest pulmonary embolus. Tortuosity of the thoracic aorta, no aneurysm or acute findings. Heart is normal in size. No pericardial effusion. There is contrast refluxing into the hepatic veins and IVC. Mild lipomatous hypertrophy of the intra-atrial septum. Mediastinum/Nodes: Prominent 9 mm right hilar lymph node. Scattered small mediastinal lymph nodes, not enlarged by size criteria. The esophagus is decompressed. No thyroid nodule. Lungs/Pleura: Small to moderate right and small left pleural effusion with adjacent compressive atelectasis. Mild heterogeneous pulmonary parenchyma. No pneumothorax. No pulmonary mass or visualized nodule. Unremarkable appearance of the trachea and central bronchi. Upper Abdomen: Contrast refluxes into the hepatic veins and IVC. Trace perihepatic ascites. Musculoskeletal: Subcutaneous edema in the right flank. There are no acute or suspicious osseous abnormalities. Slight scoliotic curvature. Review of the MIP images confirms the above findings. IMPRESSION: 1. No pulmonary embolus. 2. Small to moderate right and small left pleural effusions with adjacent compressive atelectasis. 3. Contrast refluxing into the hepatic veins and IVC, consistent with elevated right heart pressures. 4. Mild heterogeneous pulmonary parenchyma,  can be seen with small airways disease. 5. Trace perihepatic ascites in the upper abdomen.  Electronically Signed   By: Keith Rake M.D.   On: 07/29/2022 03:18   DG Abd 1 View  Result Date: 07/28/2022 CLINICAL DATA:  Concern for ileus. EXAM: ABDOMEN - 1 VIEW COMPARISON:  07/27/2022 FINDINGS: There is persistent dilatation of large bowel loops. Dilatation of the transverse and ascending colon are slightly improved. There is a persistent loop of large bowel in the LEFT central abdomen, likely LOWER descending colon which appears more distended compared to prior studies. Question of wall thickening and loss of calcium markings of this loop. IMPRESSION: Persistent but slightly improved dilatation of large bowel loops. Question thickened wall of the LOWER descending colon loop in the LEFT central abdomen. Consider follow-up CT exam if needed. Electronically Signed   By: Nolon Nations M.D.   On: 07/28/2022 08:23   DG Abd 1 View  Result Date: 07/27/2022 CLINICAL DATA:  Abdominal pain. EXAM: ABDOMEN - 1 VIEW COMPARISON:  Abdominal x-ray from yesterday. FINDINGS: Diffuse colonic distention has mildly improved since yesterday. There is a suggestion of increased wall thickening involving the redundant sigmoid colon. No small bowel dilatation. No acute osseous abnormality. IMPRESSION: 1. Improving colonic ileus. 2. Possible increased wall thickening involving the redundant sigmoid colon, which could reflect colitis. Electronically Signed   By: Titus Dubin M.D.   On: 07/27/2022 11:59    Assessment/Plan: Postop day #11: The patient seems to be recovering well from her surgery except as below.  Ileus: This seems to be resolving.  A-fib: The patient is okay for anticoagulation at this point from my point of view if cardiology feels this is indicated.  I appreciate Dr.  Gaynelle Adu excellent care of this patient.  I will be out of town until Tuesday.  Please call the on-call doctor if any neurosurgical issues should arise.  LOS: 4 days     [name] 07/29/2022, 7:04 AM     Patient  ID: Sara Mcdonald, Sara Mcdonald   DOB: Sep 07, 1946, 76 y.o.   MRN: [id number]

## 2022-07-29 NOTE — Progress Notes (Signed)
Bilateral LE venous duplex study completed. Please see CV Proc for preliminary results.  Ariannah Arenson BS, RVT 07/29/2022 2:23 PM

## 2022-07-29 NOTE — Transfer of Care (Signed)
Immediate Anesthesia Transfer of Care Note  Patient: Sara Mcdonald  Procedure(s) Performed: FLEXIBLE SIGMOIDOSCOPY POLYPECTOMY BIOPSY  Patient Location: PACU  Anesthesia Type:MAC  Level of Consciousness: awake, alert  and drowsy  Airway & Oxygen Therapy: Patient Spontanous Breathing and Patient connected to face mask oxygen  Post-op Assessment: Report given to RN, Post -op Vital signs reviewed and stable and Patient moving all extremities X 4  Post vital signs: Reviewed and stable  Last Vitals:  Vitals Value Taken Time  BP 93/57 07/29/22 0945  Temp    Pulse 92 07/29/22 0949  Resp 21 07/29/22 0949  SpO2 96 % 07/29/22 0949  Vitals shown include unvalidated device data.  Last Pain:  Vitals:   07/29/22 0840  TempSrc: Temporal  PainSc: 0-No pain      Patients Stated Pain Goal: 0 (01/75/10 2585)  Complications: No notable events documented.

## 2022-07-29 NOTE — Interval H&P Note (Signed)
History and Physical Interval Note:  Patient with episodes of A-fib with RVR overnight.  Heart rate improved this morning to the low 100s.  Did have liquid stools last night and early this morning.  Received enema last night and this morning.  Labs today with K2.9 and mag 1.7; supplement already ordered.  Discussed with anesthesia staff and patient, and plan to proceed with flexible sigmoidoscopy today as planned.  07/29/2022 8:47 AM  Sara Mcdonald  has presented today for surgery, with the diagnosis of Ileus, colon thickening.  The various methods of treatment have been discussed with the patient and family. After consideration of risks, benefits and other options for treatment, the patient has consented to  Procedure(s): FLEXIBLE SIGMOIDOSCOPY (N/A) as a surgical intervention.  The patient's history has been reviewed, patient examined, no change in status, stable for surgery.  I have reviewed the patient's chart and labs.  Questions were answered to the patient's satisfaction.     Dominic Pea Iverna Hammac

## 2022-07-29 NOTE — Anesthesia Preprocedure Evaluation (Addendum)
Anesthesia Evaluation  Patient identified by MRN, date of birth, ID band Patient awake    Reviewed: Allergy & Precautions, NPO status , Patient's Chart, lab work & pertinent test results  History of Anesthesia Complications Negative for: history of anesthetic complications  Airway Mallampati: IV  TM Distance: <3 FB Neck ROM: Full   Comment: L4-L5 Decompression 07/20/22 airway note Ventilation: Mask ventilation without difficulty and Two handed mask ventilation required Laryngoscope Size: Glidescope and 3 Grade View: Grade I Tube type: Oral Tube size: 7.0 mm Number of attempts: 1 Dental  (+) Teeth Intact, Dental Advisory Given   Pulmonary shortness of breath, asthma , sleep apnea , COPD,    breath sounds clear to auscultation       Cardiovascular hypertension, Pt. on medications (-) angina(-) Past MI and (-) CHF + dysrhythmias Atrial Fibrillation  Rhythm:Regular     Neuro/Psych  Headaches, PSYCHIATRIC DISORDERS Anxiety Depression  Neuromuscular disease    GI/Hepatic Neg liver ROS, GERD  Medicated,  Endo/Other  diabetes, Type 2, Oral Hypoglycemic Agents  Renal/GU CRFRenal disease     Musculoskeletal  (+) Arthritis , Fibromyalgia -  Abdominal   Peds  Hematology negative hematology ROS (+)   Anesthesia Other Findings New onset AF that started last evening.   Reproductive/Obstetrics                         Anesthesia Physical  Anesthesia Plan  ASA: 3  Anesthesia Plan: MAC   Post-op Pain Management:    Induction: Intravenous  PONV Risk Score and Plan: 3 and Ondansetron, Dexamethasone and Treatment may vary due to age or medical condition  Airway Management Planned: Natural Airway and Mask  Additional Equipment: None  Intra-op Plan:   Post-operative Plan: Extubation in OR  Informed Consent: I have reviewed the patients History and Physical, chart, labs and discussed the procedure  including the risks, benefits and alternatives for the proposed anesthesia with the patient or authorized representative who has indicated his/her understanding and acceptance.       Plan Discussed with: Anesthesiologist and CRNA  Anesthesia Plan Comments: (HPI: Sara Mcdonald is a 76 y.o. female with history of diabetes mellitus type 2, hypertension, fibromyalgia, GERD, sleep apnea who had recent surgery for L4-L5 decompression discharged on July 20, 2022 presents back to the ER with diffuse abdominal discomfort nausea vomiting at least 3 episodes today with not having moving bowel since discharge.  Denies any blood in the vomitus.  ED Course: In the ER patient had a CT scan of the abdomen pelvis showing features concerning for possible postoperative colonic ileus differentials including infectious or inflammatory colitis.  ER physician ordered enema following which patient had bowel movement and eventually became bloody.  CT was negative.  Patient was started on fluids admitted for further work-up.  PAT note by Karoline Caldwell, PA-C: History of resting tachycardia for a number of years with baseline heart rate ~110.  This is followed by her PCP Dr. Emily Filbert.  Heart rate noted to be 128 at PAT (blood pressure 145/77), patient asymptomatic, no chest pain, shortness of breath, palpitations.  She had an echo in February 2018 that showed normal LV function, normal wall motion, no significant valvular abnormalities.  Most recent EKG 04/27/2022 showed sinus rhythm with PACs, rate 99.  Patient last seen by Dr. Sabra Heck 6/23 for annual exam.  Heart rate at that time 108.  A1c noted to be stable at 6.1, creatinine was up mildly  to 1.8 -triamterene was changed to as needed and patient was encouraged to drink more water. Dr. Sabra Heck previously cleared patient is low risk for surgery on 06/10/2022.  Copy of clearance on chart.  Patient monitors heart rate at home, she was encouraged to check this several times  daily and if it is remaining elevated above her baseline she will reach out to Dr. Sabra Heck.  Otherwise, anticipate she can proceed with surgery as planned.  EKG 04/27/2022 (copy on chart): Sinus rhythm with PACs.  Rate 99. TTE 02/02/2017: - Left ventricle: The cavity size was normal. Wall thickness was  normal. Systolic function was normal. The estimated ejection  fraction was in the range of 60% to 65%. Wall motion was normal;  there were no regional wall motion abnormalities.  - Aortic valve: Valve area (Vmax): 2.03 cm^2.   Impressions:  normal left ventricular function  Normal overall wall motion  Ejection fraction greater than 60%  Normal right side. Normal study. No cardiac source of emboli was  indentified.   )        Anesthesia Quick Evaluation

## 2022-07-29 NOTE — Progress Notes (Signed)
PROGRESS NOTE    Sara Mcdonald  AUQ:[id number] DOB: 11-23-1946 DOA: 07/24/2022 PCP: [name], MD   Brief Narrative: Sara Mcdonald is a 76 y.o. female with a history of diabetes mellitus type 2, fibromyalgia, GERD, sleep apnea, recent L4-L5 decompression surgery, depression, anxiety. Patient presented secondary to abdominal pain with bowel movements with concern for ileus vs inflammatory colitis seen on CT imaging. GI consulted. Having BMs.   Assessment and Plan:  Abdominal pain Concern for postoperative colonic ileus vs inflammatory colitis. History of blood per rectum. Patient has been on opiates for her post-operative pain management. GI consulted.  -Follow-up GI recommendations:   Erythematous mucosa in the sigmoid colon and in the distal descending colon. Biopsied.  - Non-bleeding internal hemorrhoids.   - One 4 mm polyp in the transverse colon, removed  with a cold snare. Resected and retrieved. -Continue empiric Ciprofloxacin and Flagyl empirically Avoid pain meds-- use heat -bowel regimen- s/p SMOG enema  Dyspnea -? Related to a fib -off O2 -small pleural effusion -will use IV lasix and monitor -CTA negative for PE -LE duplex pending  A fib with RVR -BB started -Mali vasc 2- of 4 -- will need anticoagulation to prevent strokes after echo -echo pending  History of L4-L5 decompression Recent surgery. Neurosurgery added to treatment team  Diabetes mellitus, type 2 Patient is on metformin as an outpatient, which was held. -Continue SSI  Primary hypertension -Resume olmesartan (irbesartan while inpatient)  Anxiety Depression Patient is on Xanax and Effexor   Hyperlipidemia Pravastatin held on admission  CKD stage IIIa Creatinine is stable.  Obesity Estimated body mass index is 36.76 kg/m as calculated from the following:   Height as of this encounter: '4\' 11"'$  (1.499 m).   Weight as of this encounter: 82.6 kg.     DVT prophylaxis:  SCDs Code Status:   Code Status: Full Code Family Communication: None at bedside Disposition Plan: Discharge home likely in 2-3 days   Consultants:  [facility]    Subjective: Had episode of SOB overnight and a fib seen on monitor  Objective: BP (!) 143/84 (BP Location: Left Wrist)   Pulse 95   Temp 98.2 F (36.8 C) (Oral)   Resp 17   Ht '4\' 11"'$  (1.499 m)   Wt 82.6 kg   SpO2 95%   BMI 36.76 kg/m   Examination:     General: Appearance:    Obese female in no acute distress     Lungs:     respirations unlabored  Heart:    Normal heart rate.   MS:   All extremities are intact.   Neurologic:   Awake, alert          Data Reviewed: I have personally reviewed following labs and imaging studies  CBC Lab Results  Component Value Date   WBC 10.1 07/29/2022   RBC 3.42 (L) 07/29/2022   HGB 10.3 (L) 07/29/2022   HCT 31.6 (L) 07/29/2022   MCV 92.4 07/29/2022   MCH 30.1 07/29/2022   PLT 324 07/29/2022   MCHC 32.6 07/29/2022   RDW 14.1 07/29/2022   LYMPHSABS 0.4 (L) 03/17/2017   MONOABS 0.6 03/17/2017   EOSABS 0.1 03/17/2017   BASOSABS 0.0 63/89/3734     Last metabolic panel Lab Results  Component Value Date   NA 135 07/29/2022   K 3.3 (L) 07/29/2022   CL 103 07/29/2022   CO2 25 07/29/2022   BUN 7 (L) 07/29/2022   CREATININE 0.98 07/29/2022  GLUCOSE 121 (H) 07/29/2022   GFRNONAA 60 (L) 07/29/2022   GFRAA 44 (L) 09/07/2020   CALCIUM 8.0 (L) 07/29/2022   PROT 5.5 (L) 07/25/2022   ALBUMIN 2.5 (L) 07/25/2022   BILITOT 0.7 07/25/2022   ALKPHOS 63 07/25/2022   AST 19 07/25/2022   ALT 11 07/25/2022   ANIONGAP 7 07/29/2022    GFR: Estimated Creatinine Clearance: 45.5 mL/min (by C-G formula based on SCr of 0.98 mg/dL).  Recent Results (from the past 240 hour(s))  C Difficile Quick Screen w PCR reflex     Status: None   Collection Time: 07/25/22  1:56 AM   Specimen: STOOL  Result Value Ref Range Status   C Diff antigen NEGATIVE NEGATIVE  Final   C Diff toxin NEGATIVE NEGATIVE Final   C Diff interpretation No C. difficile detected.  Final    Comment: Performed at [facility], [place] [address], [place], [place] [address]      Radiology Studies: [place] EKG SITE RITE  Result Date: 07/29/2022 If Site Rite image not attached, placement could not be confirmed due to current cardiac rhythm.  DG CHEST PORT 1 VIEW  Result Date: 07/29/2022 CLINICAL DATA:  Shortness of breath with wheezing EXAM: PORTABLE CHEST 1 VIEW COMPARISON:  Chest CT from earlier today FINDINGS: Cardiomegaly and vascular pedicle widening. Bilateral pleural effusion and atelectasis. No pneumothorax. IMPRESSION: 1. Stable from chest CTA earlier today. 2. Atelectasis and pleural effusions. Electronically Signed   By: Jorje Guild M.D.   On: 07/29/2022 06:14   CT Angio Chest Pulmonary Embolism (PE) W or WO Contrast  Result Date: 07/29/2022 CLINICAL DATA:  Pulmonary embolism (PE) suspected, positive D-dimer EXAM: CT ANGIOGRAPHY CHEST WITH CONTRAST TECHNIQUE: Multidetector CT imaging of the chest was performed using the standard protocol during bolus administration of intravenous contrast. Multiplanar CT image reconstructions and MIPs were obtained to evaluate the vascular anatomy. RADIATION DOSE REDUCTION: This exam was performed according to the departmental dose-optimization program which includes automated exposure control, adjustment of the mA and/or kV according to patient size and/or use of iterative reconstruction technique. CONTRAST:  41m OMNIPAQUE IOHEXOL 350 MG/ML SOLN COMPARISON:  No recent chest imaging.  Chest CT 02/01/2017 reviewed FINDINGS: Cardiovascular: There are no filling defects within the pulmonary arteries to suggest pulmonary embolus. Tortuosity of the thoracic aorta, no aneurysm or acute findings. Heart is normal in size. No pericardial effusion. There is contrast refluxing into the hepatic veins and IVC. Mild lipomatous hypertrophy of the  intra-atrial septum. Mediastinum/Nodes: Prominent 9 mm right hilar lymph node. Scattered small mediastinal lymph nodes, not enlarged by size criteria. The esophagus is decompressed. No thyroid nodule. Lungs/Pleura: Small to moderate right and small left pleural effusion with adjacent compressive atelectasis. Mild heterogeneous pulmonary parenchyma. No pneumothorax. No pulmonary mass or visualized nodule. Unremarkable appearance of the trachea and central bronchi. Upper Abdomen: Contrast refluxes into the hepatic veins and IVC. Trace perihepatic ascites. Musculoskeletal: Subcutaneous edema in the right flank. There are no acute or suspicious osseous abnormalities. Slight scoliotic curvature. Review of the MIP images confirms the above findings. IMPRESSION: 1. No pulmonary embolus. 2. Small to moderate right and small left pleural effusions with adjacent compressive atelectasis. 3. Contrast refluxing into the hepatic veins and IVC, consistent with elevated right heart pressures. 4. Mild heterogeneous pulmonary parenchyma, can be seen with small airways disease. 5. Trace perihepatic ascites in the upper abdomen. Electronically Signed   By: MKeith RakeM.D.   On: 07/29/2022 03:18   DG  Abd 1 View  Result Date: 07/28/2022 CLINICAL DATA:  Concern for ileus. EXAM: ABDOMEN - 1 VIEW COMPARISON:  07/27/2022 FINDINGS: There is persistent dilatation of large bowel loops. Dilatation of the transverse and ascending colon are slightly improved. There is a persistent loop of large bowel in the LEFT central abdomen, likely LOWER descending colon which appears more distended compared to prior studies. Question of wall thickening and loss of calcium markings of this loop. IMPRESSION: Persistent but slightly improved dilatation of large bowel loops. Question thickened wall of the LOWER descending colon loop in the LEFT central abdomen. Consider follow-up CT exam if needed. Electronically Signed   By: Nolon Nations M.D.   On:  07/28/2022 08:23      LOS: 4 days    Eulogio Bear DO [facility] 07/29/2022, 1:53 PM   If 7PM-7AM, please contact night-coverage [email or web address]

## 2022-07-30 ENCOUNTER — Inpatient Hospital Stay (HOSPITAL_COMMUNITY): Payer: PPO

## 2022-07-30 ENCOUNTER — Encounter (HOSPITAL_COMMUNITY): Payer: Self-pay | Admitting: Gastroenterology

## 2022-07-30 DIAGNOSIS — R0609 Other forms of dyspnea: Secondary | ICD-10-CM | POA: Diagnosis not present

## 2022-07-30 DIAGNOSIS — I4891 Unspecified atrial fibrillation: Secondary | ICD-10-CM | POA: Diagnosis not present

## 2022-07-30 DIAGNOSIS — K567 Ileus, unspecified: Secondary | ICD-10-CM | POA: Diagnosis not present

## 2022-07-30 DIAGNOSIS — K641 Second degree hemorrhoids: Secondary | ICD-10-CM

## 2022-07-30 DIAGNOSIS — K5730 Diverticulosis of large intestine without perforation or abscess without bleeding: Secondary | ICD-10-CM | POA: Diagnosis not present

## 2022-07-30 DIAGNOSIS — D123 Benign neoplasm of transverse colon: Secondary | ICD-10-CM | POA: Diagnosis not present

## 2022-07-30 DIAGNOSIS — K6389 Other specified diseases of intestine: Secondary | ICD-10-CM | POA: Diagnosis not present

## 2022-07-30 LAB — CBC
HCT: 34.9 % — ABNORMAL LOW (ref 36.0–46.0)
Hemoglobin: 10.8 g/dL — ABNORMAL LOW (ref 12.0–15.0)
MCH: 29.1 pg (ref 26.0–34.0)
MCHC: 30.9 g/dL (ref 30.0–36.0)
MCV: 94.1 fL (ref 80.0–100.0)
Platelets: 352 10*3/uL (ref 150–400)
RBC: 3.71 MIL/uL — ABNORMAL LOW (ref 3.87–5.11)
RDW: 14.2 % (ref 11.5–15.5)
WBC: 10.5 10*3/uL (ref 4.0–10.5)
nRBC: 0 % (ref 0.0–0.2)

## 2022-07-30 LAB — ECHOCARDIOGRAM COMPLETE
Area-P 1/2: 3.65 cm2
Height: 59 in
S' Lateral: 2.9 cm
Weight: 3120 oz

## 2022-07-30 LAB — BASIC METABOLIC PANEL
Anion gap: 9 (ref 5–15)
BUN: 6 mg/dL — ABNORMAL LOW (ref 8–23)
CO2: 24 mmol/L (ref 22–32)
Calcium: 8.1 mg/dL — ABNORMAL LOW (ref 8.9–10.3)
Chloride: 103 mmol/L (ref 98–111)
Creatinine, Ser: 1.12 mg/dL — ABNORMAL HIGH (ref 0.44–1.00)
GFR, Estimated: 51 mL/min — ABNORMAL LOW (ref >60)
Glucose, Bld: 123 mg/dL — ABNORMAL HIGH (ref 70–99)
Potassium: 3.3 mmol/L — ABNORMAL LOW (ref 3.5–5.1)
Sodium: 136 mmol/L (ref 135–145)

## 2022-07-30 LAB — GLUCOSE, CAPILLARY
Glucose-Capillary: 110 mg/dL — ABNORMAL HIGH (ref 70–99)
Glucose-Capillary: 120 mg/dL — ABNORMAL HIGH (ref 70–99)
Glucose-Capillary: 118 mg/dL — ABNORMAL HIGH (ref 70–99)
Glucose-Capillary: 151 mg/dL — ABNORMAL HIGH (ref 70–99)
Glucose-Capillary: 133 mg/dL — ABNORMAL HIGH (ref 70–99)

## 2022-07-30 MED ORDER — METOPROLOL TARTRATE 25 MG PO TABS
25.0000 mg | ORAL_TABLET | Freq: Two times a day (BID) | ORAL | Status: DC
  Administered 2022-07-30 – 2022-07-31 (×3): 25 mg via ORAL
  Filled 2022-07-30 (×3): qty 1

## 2022-07-30 MED ORDER — POTASSIUM CHLORIDE CRYS ER 20 MEQ PO TBCR
40.0000 meq | EXTENDED_RELEASE_TABLET | Freq: Every day | ORAL | Status: DC
  Administered 2022-07-30: 40 meq via ORAL
  Filled 2022-07-30 (×2): qty 2

## 2022-07-30 MED ORDER — FUROSEMIDE 10 MG/ML IJ SOLN
40.0000 mg | Freq: Two times a day (BID) | INTRAMUSCULAR | Status: DC
  Administered 2022-07-30 – 2022-07-31 (×3): 40 mg via INTRAVENOUS
  Filled 2022-07-30 (×3): qty 4

## 2022-07-30 MED ORDER — APIXABAN 5 MG PO TABS
5.0000 mg | ORAL_TABLET | Freq: Two times a day (BID) | ORAL | Status: DC
  Administered 2022-07-30 – 2022-08-04 (×11): 5 mg via ORAL
  Filled 2022-07-30 (×11): qty 1

## 2022-07-30 MED ORDER — FUROSEMIDE 10 MG/ML IJ SOLN
40.0000 mg | Freq: Once | INTRAMUSCULAR | Status: AC
  Administered 2022-07-30: 40 mg via INTRAVENOUS
  Filled 2022-07-30: qty 4

## 2022-07-30 NOTE — Progress Notes (Signed)
PROGRESS NOTE    Sara Mcdonald  DDU:[id number] DOB: 12/17/1946 DOA: 07/24/2022 PCP: [name], MD   Brief Narrative: Sara Mcdonald is a 76 y.o. female with a history of diabetes mellitus type 2, fibromyalgia, GERD, sleep apnea, recent L4-L5 decompression surgery, depression, anxiety. Patient presented secondary to abdominal pain with bowel movements with concern for ileus vs inflammatory colitis seen on CT imaging. GI consulted. Having BMs. Hospital stay complicated by a fib and dyspnea.     Assessment and Plan:  Abdominal pain Concern for postoperative colonic ileus vs inflammatory colitis. History of blood per rectum. Patient has been on opiates for her post-operative pain management. GI consulted.  -Follow-up GI recommendations:   Erythematous mucosa in the sigmoid colon and in the distal descending colon. Biopsied.  - Non-bleeding internal hemorrhoids.   - One 4 mm polyp in the transverse colon, removed  with a cold snare. Resected and retrieved. -Continue empiric Ciprofloxacin and Flagyl empirically Avoid pain meds-- use heat -bowel regimen- s/p SMOG enema  Dyspnea -? Related to a fib -wean O2 as able -small pleural effusion -will use IV lasix and monitor for response -CTA negative for PE -LE duplex negative  A fib with RVR -BB started -Mali vasc 2- of 4 -- will need anticoagulation to prevent strokes - will start since echo still pending -echo pending still  History of L4-L5 decompression Recent surgery. Neurosurgery added to treatment team  Diabetes mellitus, type 2 Patient is on metformin as an outpatient, which was held. -Continue SSI  Primary hypertension -Resume olmesartan (irbesartan while inpatient)  Anxiety Depression Patient is on Xanax and Effexor   Hyperlipidemia Pravastatin held on admission  CKD stage IIIa Creatinine is stable.  Obesity Estimated body mass index is 39.39 kg/m as calculated from the following:   Height as of  this encounter: '4\' 11"'$  (1.499 m).   Weight as of this encounter: 88.5 kg.     DVT prophylaxis: SCDs Code Status:   Code Status: Full Code Family Communication: at bedside 8/11 Disposition Plan: home once improved   Consultants:  [facility]    Subjective: Still very SOB  Objective: BP (!) 163/90 (BP Location: Left Arm)   Pulse (!) 108   Temp 98.1 F (36.7 C) (Oral)   Resp 17   Ht '4\' 11"'$  (1.499 m)   Wt 88.5 kg   SpO2 99%   BMI 39.39 kg/m   Examination:    General: Appearance:    Obese female in no acute distress     Lungs:     Diminished, respirations mildly labored  Heart:    Tachycardic. Normal rhythm. No murmurs, rubs, or gallops.   MS:   All extremities are intact.   Neurologic:   Awake, alert, oriented x 3            Data Reviewed: I have personally reviewed following labs and imaging studies  CBC Lab Results  Component Value Date   WBC 10.5 07/30/2022   RBC 3.71 (L) 07/30/2022   HGB 10.8 (L) 07/30/2022   HCT 34.9 (L) 07/30/2022   MCV 94.1 07/30/2022   MCH 29.1 07/30/2022   PLT 352 07/30/2022   MCHC 30.9 07/30/2022   RDW 14.2 07/30/2022   LYMPHSABS 0.4 (L) 03/17/2017   MONOABS 0.6 03/17/2017   EOSABS 0.1 03/17/2017   BASOSABS 0.0 23/76/2831     Last metabolic panel Lab Results  Component Value Date   NA 136 07/30/2022   K 3.3 (L) 07/30/2022  CL 103 07/30/2022   CO2 24 07/30/2022   BUN 6 (L) 07/30/2022   CREATININE 1.12 (H) 07/30/2022   GLUCOSE 123 (H) 07/30/2022   GFRNONAA 51 (L) 07/30/2022   GFRAA 44 (L) 09/07/2020   CALCIUM 8.1 (L) 07/30/2022   PROT 5.5 (L) 07/25/2022   ALBUMIN 2.5 (L) 07/25/2022   BILITOT 0.7 07/25/2022   ALKPHOS 63 07/25/2022   AST 19 07/25/2022   ALT 11 07/25/2022   ANIONGAP 9 07/30/2022    GFR: Estimated Creatinine Clearance: 41.4 mL/min (A) (by C-G formula based on SCr of 1.12 mg/dL (H)).  Recent Results (from the past 240 hour(s))  C Difficile Quick Screen w PCR reflex     Status:  None   Collection Time: 07/25/22  1:56 AM   Specimen: STOOL  Result Value Ref Range Status   C Diff antigen NEGATIVE NEGATIVE Final   C Diff toxin NEGATIVE NEGATIVE Final   C Diff interpretation No C. difficile detected.  Final    Comment: Performed at [facility], [place] [address], [place], [place] [address]      Radiology Studies: VAS [place] LOWER EXTREMITY VENOUS (DVT)  Result Date: 07/30/2022  Lower Venous DVT Study Patient Name:  Sara Mcdonald  Date of Exam:   07/29/2022 Medical Rec #: [id number]         Accession #:    [id number] Date of Birth: [birth year]         Patient Gender: F Patient Age:   50 years Exam Location:  [facility] Procedure:      VAS [place] LOWER EXTREMITY VENOUS (DVT) Referring Phys: ERIC [place] --------------------------------------------------------------------------------  Indications: Swelling.  Comparison Study: No previous exam noted. Performing Technologist: Bobetta Lime BS, RVT  Examination Guidelines: A complete evaluation includes B-mode imaging, spectral Doppler, color Doppler, and power Doppler as needed of all accessible portions of each vessel. Bilateral testing is considered an integral part of a complete examination. Limited examinations for reoccurring indications may be performed as noted. The reflux portion of the exam is performed with the patient in reverse Trendelenburg.  +---------+---------------+---------+-----------+----------+--------------+ RIGHT    CompressibilityPhasicitySpontaneityPropertiesThrombus Aging +---------+---------------+---------+-----------+----------+--------------+ CFV      Full           Yes      Yes                                 +---------+---------------+---------+-----------+----------+--------------+ SFJ      Full                                                        +---------+---------------+---------+-----------+----------+--------------+ FV Prox  Full                                                         +---------+---------------+---------+-----------+----------+--------------+ FV Mid   Full                                                        +---------+---------------+---------+-----------+----------+--------------+  FV DistalFull                                                        +---------+---------------+---------+-----------+----------+--------------+ PFV      Full                                                        +---------+---------------+---------+-----------+----------+--------------+ POP      Full           Yes      Yes                                 +---------+---------------+---------+-----------+----------+--------------+ PTV      Full                                                        +---------+---------------+---------+-----------+----------+--------------+ PERO     Full                                                        +---------+---------------+---------+-----------+----------+--------------+   +---------+---------------+---------+-----------+----------+--------------+ LEFT     CompressibilityPhasicitySpontaneityPropertiesThrombus Aging +---------+---------------+---------+-----------+----------+--------------+ CFV      Full           Yes      Yes                                 +---------+---------------+---------+-----------+----------+--------------+ SFJ      Full                                                        +---------+---------------+---------+-----------+----------+--------------+ FV Prox  Full                                                        +---------+---------------+---------+-----------+----------+--------------+ FV Mid   Full                                                        +---------+---------------+---------+-----------+----------+--------------+ FV DistalFull                                                         +---------+---------------+---------+-----------+----------+--------------+  PFV      Full                                                        +---------+---------------+---------+-----------+----------+--------------+ POP      Full           Yes      Yes                                 +---------+---------------+---------+-----------+----------+--------------+ PTV      Full                                                        +---------+---------------+---------+-----------+----------+--------------+ PERO     Full                                                        +---------+---------------+---------+-----------+----------+--------------+     Summary: BILATERAL: - No evidence of deep vein thrombosis seen in the lower extremities, bilaterally. -No evidence of popliteal cyst, bilaterally.   *See table(s) above for measurements and observations. Electronically signed by Orlie Pollen on 07/30/2022 at 12:31:27 PM.    Final    [place] EKG SITE RITE  Result Date: 07/29/2022 If Site Rite image not attached, placement could not be confirmed due to current cardiac rhythm.  DG CHEST PORT 1 VIEW  Result Date: 07/29/2022 CLINICAL DATA:  Shortness of breath with wheezing EXAM: PORTABLE CHEST 1 VIEW COMPARISON:  Chest CT from earlier today FINDINGS: Cardiomegaly and vascular pedicle widening. Bilateral pleural effusion and atelectasis. No pneumothorax. IMPRESSION: 1. Stable from chest CTA earlier today. 2. Atelectasis and pleural effusions. Electronically Signed   By: Jorje Guild M.D.   On: 07/29/2022 06:14   CT Angio Chest Pulmonary Embolism (PE) W or WO Contrast  Result Date: 07/29/2022 CLINICAL DATA:  Pulmonary embolism (PE) suspected, positive D-dimer EXAM: CT ANGIOGRAPHY CHEST WITH CONTRAST TECHNIQUE: Multidetector CT imaging of the chest was performed using the standard protocol during bolus administration of intravenous contrast. Multiplanar CT image reconstructions and MIPs  were obtained to evaluate the vascular anatomy. RADIATION DOSE REDUCTION: This exam was performed according to the departmental dose-optimization program which includes automated exposure control, adjustment of the mA and/or kV according to patient size and/or use of iterative reconstruction technique. CONTRAST:  44m OMNIPAQUE IOHEXOL 350 MG/ML SOLN COMPARISON:  No recent chest imaging.  Chest CT 02/01/2017 reviewed FINDINGS: Cardiovascular: There are no filling defects within the pulmonary arteries to suggest pulmonary embolus. Tortuosity of the thoracic aorta, no aneurysm or acute findings. Heart is normal in size. No pericardial effusion. There is contrast refluxing into the hepatic veins and IVC. Mild lipomatous hypertrophy of the intra-atrial septum. Mediastinum/Nodes: Prominent 9 mm right hilar lymph node. Scattered small mediastinal lymph nodes, not enlarged by size criteria. The esophagus is decompressed. No thyroid nodule. Lungs/Pleura: Small to moderate right and small left pleural effusion with adjacent compressive atelectasis. Mild heterogeneous pulmonary parenchyma.  No pneumothorax. No pulmonary mass or visualized nodule. Unremarkable appearance of the trachea and central bronchi. Upper Abdomen: Contrast refluxes into the hepatic veins and IVC. Trace perihepatic ascites. Musculoskeletal: Subcutaneous edema in the right flank. There are no acute or suspicious osseous abnormalities. Slight scoliotic curvature. Review of the MIP images confirms the above findings. IMPRESSION: 1. No pulmonary embolus. 2. Small to moderate right and small left pleural effusions with adjacent compressive atelectasis. 3. Contrast refluxing into the hepatic veins and IVC, consistent with elevated right heart pressures. 4. Mild heterogeneous pulmonary parenchyma, can be seen with small airways disease. 5. Trace perihepatic ascites in the upper abdomen. Electronically Signed   By: Keith Rake M.D.   On: 07/29/2022 03:18       LOS: 5 days    Eulogio Bear DO [facility] 07/30/2022, 12:39 PM   If 7PM-7AM, please contact night-coverage [email or web address]

## 2022-07-30 NOTE — Progress Notes (Signed)
  Echocardiogram 2D Echocardiogram has been performed.  Sara Mcdonald F 07/30/2022, 1:15 PM

## 2022-07-30 NOTE — Progress Notes (Signed)
ANTICOAGULATION CONSULT NOTE - Initial Consult  Pharmacy Consult for Apixaban Indication: atrial fibrillation  Allergies  Allergen Reactions   Elemental Sulfur Rash   Sulfa Antibiotics Rash   Tricor [Fenofibrate] Rash   Patient Measurements: Height: '4\' 11"'$  (149.9 cm) Weight: 82.6 kg (182 lb) IBW/kg (Calculated) : 43.2 kg  Vital Signs: Temp: 98.1 F (36.7 C) (08/12 0837) Temp Source: Oral (08/12 0837) BP: 163/90 (08/12 0837) Pulse Rate: 108 (08/12 0837)  Labs: Recent Labs    07/28/22 0156 07/29/22 0105 07/29/22 1158  HGB 10.8* 11.5* 10.3*  HCT 32.8* 36.2 31.6*  PLT 348 431* 324  CREATININE 0.98 1.02* 0.98    Estimated Creatinine Clearance: 45.5 mL/min (by C-G formula based on SCr of 0.98 mg/dL).   Medical History: Past Medical History:  Diagnosis Date   Anxiety    Chronic kidney disease    CKD3   Depression    Diabetes mellitus without complication (HCC)    Dyspnea    with episodes of pleurisy   Elevated lipids    Fibromyalgia    GERD (gastroesophageal reflux disease)    Headache    Hypertension    OA (osteoarthritis)    Pneumonia    has had several times. last time 2019   Sleep apnea    Assessment: 76 yo female presents with abdominal pain and is found to have new onset atrial fibrillation. PTA the patient is not on anticoagulation. Pharmacy is consulted to dose apixaban.   The patient does not meet criteria for reduced dose apixaban, therefore, will initiate apixaban PO 5 mg BID. Of note, the patient has a history of blood per rectum and will be encouraged to monitor for signs and symptoms of bleeding. Hgb 10.3, platelets 324.  Goal of Therapy:  Monitor platelets by anticoagulation protocol: Yes   Plan:  Initiate apixaban PO 5 mg BID Follow up on apixaban cost Counsel patient on apixaban prior to discharge Monitor for signs and symptoms of bleeding Obtain a CBC Q72H  Shauna Hugh, PharmD, MS 07/30/2022 9:46 AM  Please check [email or web address] for  unit-specific pharmacy phone numbers.

## 2022-07-30 NOTE — Discharge Instructions (Signed)

## 2022-07-30 NOTE — Progress Notes (Addendum)
Attending physician's note   I have taken a history, reviewed the chart, and examined the patient. I performed a substantive portion of this encounter, including complete performance of at least one of the key components, in conjunction with the APP. I agree with the APP's note, impression, and recommendations with my edits.   Overall improving.  Tolerating p.o. intake.  Had BM x2 today.  - Continue MiraLAX and titrate to soft stools without straining to have BM. - Can add Dulcolax suppository if needed - No need for additional enemas - Continue advancing diet as tolerated - Continue ambulation - Can discontinue antibiotics - Path pending - GI service will sign off at this time.  Please do not hesitate to contact us with additional questions or concerns  Gerrit Heck, DO, FACG [phone] office         Daily Progress Note  Hospital Day: 7  Chief Complaint: colonic ileus  Brief History Sara Mcdonald is a 76 y.o. female with a pmh not limited to CKD, DM, OSA, obesity, fibromyalgia, chronic constipation, colon polyps, diverticulosis, GERD. We were consulted on 8/7 for post-op ileus ( had back surgery)   CT A/P showed colon that is diffusely filled with stool, fluid, and air with mild colonic distension with cecum up to 8.1 cm.    Assessment / Plan   # Post- op colonic ileus and descending colon wall thickening on CT scan.  Bowel have been purged with SMOG, methylnatrexone. No masses or other major findings on flexible sigmoidoscopy yesterday,  just erythematous mucosa in the left colon and small polyp  Path pending Still getting Cipro / flagyl. In light of flex sig findings the antibiotics can probably be stopped She tolerated solids this am. No N/V.  Encouraged ambulation  # Colon polyp. Small polyp removed on flexible sigmoidoscopy. Path pending  # Chronic constipation, recently exacerbated by surgery / pain meds.  Continue BID Miralax. If not having daily  BM would add Dulcolax suppositories  Subjective     Objective   Endoscopic studies:  Flexible sigmoidoscopy - Diverticulosis - Erythematous mucosa in the sigmoid colon and in the distal descending colon. Biopsied. - Non-bleeding internal hemorrhoids. - One 4 mm polyp in the transverse colon, removed with a cold snare. Resected and retrieved. - After examination to the rectum, the endoscope was re-advanced to the transverse colon and fully desufflated.  Imaging:  CT Angio Chest Pulmonary Embolism (PE) W or WO Contrast  Result Date: 07/29/2022 CLINICAL DATA:  Pulmonary embolism (PE) suspected, positive D-dimer EXAM: CT ANGIOGRAPHY CHEST WITH CONTRAST TECHNIQUE: Multidetector CT imaging of the chest was performed using the standard protocol during bolus administration of intravenous contrast. Multiplanar CT image reconstructions and MIPs were obtained to evaluate the vascular anatomy. RADIATION DOSE REDUCTION: This exam was performed according to the departmental dose-optimization program which includes automated exposure control, adjustment of the mA and/or kV according to patient size and/or use of iterative reconstruction technique. CONTRAST:  80m OMNIPAQUE IOHEXOL 350 MG/ML SOLN COMPARISON:  No recent chest imaging.  Chest CT 02/01/2017 reviewed FINDINGS: Cardiovascular: There are no filling defects within the pulmonary arteries to suggest pulmonary embolus. Tortuosity of the thoracic aorta, no aneurysm or acute findings. Heart is normal in size. No pericardial effusion. There is contrast refluxing into the hepatic veins and IVC. Mild lipomatous hypertrophy of the intra-atrial septum. Mediastinum/Nodes: Prominent 9 mm right hilar lymph node. Scattered small mediastinal lymph nodes, not enlarged by size criteria. The esophagus is  decompressed. No thyroid nodule. Lungs/Pleura: Small to moderate right and small left pleural effusion with adjacent compressive atelectasis. Mild heterogeneous  pulmonary parenchyma. No pneumothorax. No pulmonary mass or visualized nodule. Unremarkable appearance of the trachea and central bronchi. Upper Abdomen: Contrast refluxes into the hepatic veins and IVC. Trace perihepatic ascites. Musculoskeletal: Subcutaneous edema in the right flank. There are no acute or suspicious osseous abnormalities. Slight scoliotic curvature. Review of the MIP images confirms the above findings. IMPRESSION: 1. No pulmonary embolus. 2. Small to moderate right and small left pleural effusions with adjacent compressive atelectasis. 3. Contrast refluxing into the hepatic veins and IVC, consistent with elevated right heart pressures. 4. Mild heterogeneous pulmonary parenchyma, can be seen with small airways disease. 5. Trace perihepatic ascites in the upper abdomen. Electronically Signed   By: Keith Rake M.D.   On: 07/29/2022 03:18   DG Abd 1 View  Result Date: 07/28/2022 CLINICAL DATA:  Concern for ileus. EXAM: ABDOMEN - 1 VIEW COMPARISON:  07/27/2022 FINDINGS: There is persistent dilatation of large bowel loops. Dilatation of the transverse and ascending colon are slightly improved. There is a persistent loop of large bowel in the LEFT central abdomen, likely LOWER descending colon which appears more distended compared to prior studies. Question of wall thickening and loss of calcium markings of this loop. IMPRESSION: Persistent but slightly improved dilatation of large bowel loops. Question thickened wall of the LOWER descending colon loop in the LEFT central abdomen. Consider follow-up CT exam if needed. Electronically Signed   By: Nolon Nations M.D.   On: 07/28/2022 08:23   DG Abd 1 View  Result Date: 07/27/2022 CLINICAL DATA:  Abdominal pain. EXAM: ABDOMEN - 1 VIEW COMPARISON:  Abdominal x-ray from yesterday. FINDINGS: Diffuse colonic distention has mildly improved since yesterday. There is a suggestion of increased wall thickening involving the redundant sigmoid colon. No  small bowel dilatation. No acute osseous abnormality. IMPRESSION: 1. Improving colonic ileus. 2. Possible increased wall thickening involving the redundant sigmoid colon, which could reflect colitis. Electronically Signed   By: Titus Dubin M.D.   On: 07/27/2022 11:59    Lab Results: Recent Labs    07/29/22 0105 07/29/22 1158 07/30/22 1055  WBC 13.5* 10.1 10.5  HGB 11.5* 10.3* 10.8*  HCT 36.2 31.6* 34.9*  PLT 431* 324 352   BMET Recent Labs    07/29/22 0105 07/29/22 1158 07/30/22 1055  NA 134* 135 136  K 2.9* 3.3* 3.3*  CL 102 103 103  CO2 '24 25 24  '$ GLUCOSE 149* 121* 123*  BUN 7* 7* 6*  CREATININE 1.02* 0.98 1.12*  CALCIUM 8.0* 8.0* 8.1*   LFT No results for input(s): "PROT", "ALBUMIN", "AST", "ALT", "ALKPHOS", "BILITOT", "BILIDIR", "IBILI" in the last 72 hours. PT/INR No results for input(s): "LABPROT", "INR" in the last 72 hours.   Scheduled inpatient medications:   acetaminophen  1,000 mg Oral TID   apixaban  5 mg Oral BID   Chlorhexidine Gluconate Cloth  6 each Topical Daily   furosemide  40 mg Intravenous BID   insulin aspart  0-9 Units Subcutaneous TID WC   irbesartan  75 mg Oral Daily   metoprolol tartrate  25 mg Oral BID   pantoprazole  40 mg Oral Q0600   polyethylene glycol  17 g Oral BID   potassium chloride  40 mEq Oral Daily   sodium chloride flush  10-40 mL Intracatheter Q12H   topiramate  100 mg Oral QHS   traZODone  100 mg Oral  QHS   venlafaxine XR  75 mg Oral Daily   Continuous inpatient infusions:   ciprofloxacin 400 mg (07/30/22 0612)   metronidazole 500 mg (07/30/22 0503)   PRN inpatient medications: ALPRAZolam, levalbuterol, morphine injection, ondansetron (ZOFRAN) IV, oxyCODONE, sodium chloride flush, traMADol  Vital signs in last 24 hours: Temp:  [97.7 F (36.5 C)-98.2 F (36.8 C)] 98.1 F (36.7 C) (08/12 0837) Pulse Rate:  [96-110] 108 (08/12 0837) Resp:  [17-19] 17 (08/12 0837) BP: (119-163)/(55-90) 163/90 (08/12 0837) SpO2:   [93 %-99 %] 99 % (08/12 0837) Weight:  [88.5 kg] 88.5 kg (08/12 0500) Last BM Date : 07/30/22  Intake/Output Summary (Last 24 hours) at 07/30/2022 1311 Last data filed at 07/30/2022 1142 Gross per 24 hour  Intake 480 ml  Output 2200 ml  Net -1720 ml     Physical Exam:  General: Alert female in NAD Heart:  Regular rate and rhythm. No lower extremity edema Pulmonary: Normal respiratory effort Abdomen: Soft, protuberant, nontender, a few bowel sounds.  Neurologic: Alert and oriented Psych: Pleasant. Cooperative.    Intake/Output from previous day: 08/11 0701 - 08/12 0700 In: 31 [P.O.:480] Out: 1500 [Urine:1500] Intake/Output this shift: Total I/O In: -  Out: 700 [Urine:700]    Principal Problem:   Ileus ([place]) Active Problems:   Hypertension associated with diabetes ([place])   Anxiety and depression   Type 2 diabetes mellitus with hyperlipidemia (HCC)   Moderate persistent asthma   Diverticulosis of colon without hemorrhage   Erythema of colon   Grade II internal hemorrhoids   Adenomatous polyp of transverse colon     LOS: 5 days   Tye Savoy ,NP 07/30/2022, 1:11 PM

## 2022-07-31 DIAGNOSIS — E876 Hypokalemia: Secondary | ICD-10-CM | POA: Diagnosis not present

## 2022-07-31 DIAGNOSIS — K5730 Diverticulosis of large intestine without perforation or abscess without bleeding: Secondary | ICD-10-CM

## 2022-07-31 DIAGNOSIS — K567 Ileus, unspecified: Secondary | ICD-10-CM | POA: Diagnosis not present

## 2022-07-31 DIAGNOSIS — E1169 Type 2 diabetes mellitus with other specified complication: Secondary | ICD-10-CM | POA: Diagnosis not present

## 2022-07-31 LAB — BASIC METABOLIC PANEL
Anion gap: 9 (ref 5–15)
BUN: 8 mg/dL (ref 8–23)
CO2: 28 mmol/L (ref 22–32)
Calcium: 8 mg/dL — ABNORMAL LOW (ref 8.9–10.3)
Chloride: 103 mmol/L (ref 98–111)
Creatinine, Ser: 1.19 mg/dL — ABNORMAL HIGH (ref 0.44–1.00)
GFR, Estimated: 47 mL/min — ABNORMAL LOW (ref >60)
Glucose, Bld: 121 mg/dL — ABNORMAL HIGH (ref 70–99)
Potassium: 3 mmol/L — ABNORMAL LOW (ref 3.5–5.1)
Sodium: 140 mmol/L (ref 135–145)

## 2022-07-31 LAB — CBC
HCT: 32.6 % — ABNORMAL LOW (ref 36.0–46.0)
Hemoglobin: 10.6 g/dL — ABNORMAL LOW (ref 12.0–15.0)
MCH: 29.4 pg (ref 26.0–34.0)
MCHC: 32.5 g/dL (ref 30.0–36.0)
MCV: 90.3 fL (ref 80.0–100.0)
Platelets: 279 10*3/uL (ref 150–400)
RBC: 3.61 MIL/uL — ABNORMAL LOW (ref 3.87–5.11)
RDW: 14.2 % (ref 11.5–15.5)
WBC: 7.8 10*3/uL (ref 4.0–10.5)
nRBC: 0 % (ref 0.0–0.2)

## 2022-07-31 LAB — GLUCOSE, CAPILLARY
Glucose-Capillary: 125 mg/dL — ABNORMAL HIGH (ref 70–99)
Glucose-Capillary: 130 mg/dL — ABNORMAL HIGH (ref 70–99)
Glucose-Capillary: 145 mg/dL — ABNORMAL HIGH (ref 70–99)
Glucose-Capillary: 142 mg/dL — ABNORMAL HIGH (ref 70–99)

## 2022-07-31 LAB — MAGNESIUM: Magnesium: 1.9 mg/dL (ref 1.7–2.4)

## 2022-07-31 MED ORDER — LIDOCAINE 5 % EX PTCH
2.0000 | MEDICATED_PATCH | CUTANEOUS | Status: DC
  Administered 2022-07-31 – 2022-08-01 (×2): 2 via TRANSDERMAL
  Filled 2022-07-31 (×2): qty 2

## 2022-07-31 MED ORDER — POTASSIUM CHLORIDE CRYS ER 20 MEQ PO TBCR
40.0000 meq | EXTENDED_RELEASE_TABLET | Freq: Every day | ORAL | Status: DC
  Administered 2022-08-01: 40 meq via ORAL
  Filled 2022-07-31 (×2): qty 2

## 2022-07-31 MED ORDER — IRBESARTAN 150 MG PO TABS: 150.0000 mg | ORAL_TABLET | Freq: Every day | ORAL | Status: DC

## 2022-07-31 MED ORDER — IRBESARTAN 75 MG PO TABS
75.0000 mg | ORAL_TABLET | Freq: Every day | ORAL | Status: DC
  Administered 2022-08-01 – 2022-08-04 (×4): 75 mg via ORAL
  Filled 2022-07-31 (×5): qty 1

## 2022-07-31 MED ORDER — POTASSIUM CHLORIDE CRYS ER 20 MEQ PO TBCR
40.0000 meq | EXTENDED_RELEASE_TABLET | ORAL | Status: AC
  Administered 2022-07-31 (×3): 40 meq via ORAL
  Filled 2022-07-31 (×2): qty 2

## 2022-07-31 MED ORDER — METOPROLOL TARTRATE 50 MG PO TABS
50.0000 mg | ORAL_TABLET | Freq: Two times a day (BID) | ORAL | Status: DC
  Administered 2022-07-31 – 2022-08-04 (×8): 50 mg via ORAL
  Filled 2022-07-31 (×8): qty 1

## 2022-07-31 NOTE — Progress Notes (Signed)
Went into patient's room this morning to give her lasix. Patient stated that her PICC line was hurting. Dr. Eliseo Squires came into the room and I mentioned it to her. She asked me to get an IV team consult.

## 2022-07-31 NOTE — Progress Notes (Signed)
  Mobility Specialist Criteria Algorithm Info.     07/31/22 1535  Mobility  Activity Refused mobility  Activity Response RN notified   Patient declined mobility second to 8/10 back pain. Will f/u as time permits.  [place] Yicel Shannon, [place], [place]  VHSJW:[phone] Office: [phone]

## 2022-07-31 NOTE — Progress Notes (Signed)
There was redness on Lt. Side of PICC insertion area. Patient c/o tenderness with touch. PICC was placed on 8/11. It looked infected line, but will get the advice from [facility] nurse regarding this matter. Informed patient's RN. HS [employer]

## 2022-07-31 NOTE — Plan of Care (Signed)
  Problem: Education: Goal: Knowledge of General Education information will improve Description Including pain rating scale, medication(s)/side effects and non-pharmacologic comfort measures Outcome: Progressing   Problem: Health Behavior/Discharge Planning: Goal: Ability to manage health-related needs will improve Outcome: Progressing   

## 2022-07-31 NOTE — Progress Notes (Addendum)
PROGRESS NOTE    Sara Mcdonald  FBP:[id number] DOB: 20-Apr-1946 DOA: 07/24/2022 PCP: [name], MD   Brief Narrative: Sara Mcdonald is a 76 y.o. female with a history of diabetes mellitus type 2, fibromyalgia, GERD, sleep apnea, recent L4-L5 decompression surgery, depression, anxiety. Patient presented secondary to abdominal pain with bowel movements with concern for ileus vs inflammatory colitis seen on CT imaging. GI consulted. Having BMs. Hospital stay complicated by a fib and dyspnea.     Assessment and Plan:  Abdominal pain Concern for postoperative colonic ileus vs inflammatory colitis. History of blood per rectum. Patient has been on opiates for her post-operative pain management. GI consulted.  -Follow-up GI recommendations:   Erythematous mucosa in the sigmoid colon and in the distal descending colon. Biopsied.  - Non-bleeding internal hemorrhoids.   - One 4 mm polyp in the transverse colon, removed  with a cold snare. Resected and retrieved. -treated with abx Avoid pain meds-- use heat -bowel regimen- s/p SMOG enema  Dyspnea related to acute diastolic CHF -? Related to a fib -wean O2 as able -small pleural effusion -will use IV lasix and monitor for response -CTA negative for PE -LE duplex negative for DVT  ? PICC Line infection -IV team consult  A fib with RVR -BB started -Mali vasc 2- of 4 -- will need anticoagulation to prevent strokes - will start eliquis -echo as above  History of L4-L5 decompression Recent surgery. Neurosurgery added to treatment team -added lidocaine patch  Diabetes mellitus, type 2 Patient is on metformin as an outpatient, which was held. -Continue SSI  Primary hypertension -Resume olmesartan (irbesartan while inpatient)  Anxiety Depression Patient is on Xanax and Effexor   Hyperlipidemia Pravastatin held on admission  CKD stage IIIa Creatinine is stable.  Obesity Estimated body mass index is 39.32 kg/m as  calculated from the following:   Height as of this encounter: '4\' 11"'$  (1.499 m).   Weight as of this encounter: 88.3 kg.     DVT prophylaxis: SCDs Code Status:   Code Status: Full Code Family Communication: at bedside 8/11 Disposition Plan: home once improved   Consultants:  [facility]    Subjective: Has been urinating a lot  Objective: BP (!) 150/82 (BP Location: Left Arm)   Pulse (!) 106   Temp (!) 97.5 F (36.4 C) (Oral)   Resp 16   Ht '4\' 11"'$  (1.499 m)   Wt 88.3 kg   SpO2 95%   BMI 39.32 kg/m   Examination:   General: Appearance:    Obese female in no acute distress     Lungs:      respirations unlabored, crackles at bases  Heart:    Tachycardic.  MS:   All extremities are intact. +LE edema  Neurologic:   Awake, alert          Data Reviewed: I have personally reviewed following labs and imaging studies  CBC Lab Results  Component Value Date   WBC 7.8 07/31/2022   RBC 3.61 (L) 07/31/2022   HGB 10.6 (L) 07/31/2022   HCT 32.6 (L) 07/31/2022   MCV 90.3 07/31/2022   MCH 29.4 07/31/2022   PLT 279 07/31/2022   MCHC 32.5 07/31/2022   RDW 14.2 07/31/2022   LYMPHSABS 0.4 (L) 03/17/2017   MONOABS 0.6 03/17/2017   EOSABS 0.1 03/17/2017   BASOSABS 0.0 82/42/3536     Last metabolic panel Lab Results  Component Value Date   NA 140 07/31/2022  K 3.0 (L) 07/31/2022   CL 103 07/31/2022   CO2 28 07/31/2022   BUN 8 07/31/2022   CREATININE 1.19 (H) 07/31/2022   GLUCOSE 121 (H) 07/31/2022   GFRNONAA 47 (L) 07/31/2022   GFRAA 44 (L) 09/07/2020   CALCIUM 8.0 (L) 07/31/2022   PROT 5.5 (L) 07/25/2022   ALBUMIN 2.5 (L) 07/25/2022   BILITOT 0.7 07/25/2022   ALKPHOS 63 07/25/2022   AST 19 07/25/2022   ALT 11 07/25/2022   ANIONGAP 9 07/31/2022    GFR: Estimated Creatinine Clearance: 38.9 mL/min (A) (by C-G formula based on SCr of 1.19 mg/dL (H)).  Recent Results (from the past 240 hour(s))  C Difficile Quick Screen w PCR reflex      Status: None   Collection Time: 07/25/22  1:56 AM   Specimen: STOOL  Result Value Ref Range Status   C Diff antigen NEGATIVE NEGATIVE Final   C Diff toxin NEGATIVE NEGATIVE Final   C Diff interpretation No C. difficile detected.  Final    Comment: Performed at [facility], [place] [address] [place], [place] [address]      Radiology Studies: ECHOCARDIOGRAM COMPLETE  Result Date: 07/30/2022    ECHOCARDIOGRAM REPORT   Patient Name:   Sara Mcdonald Date of Exam: 07/30/2022 Medical Rec #:  [id number]        Height:       59.0 in Accession #:    [id number]       Weight:       195.0 lb Date of Birth:  07-Jan-1946        BSA:          1.825 m Patient Age:    24 years         BP:           163/90 mmHg Patient Gender: F                HR:           78 bpm. Exam Location:  Inpatient Procedure: 2D Echo, Cardiac Doppler and Color Doppler Indications:    Pulmonary embolism  History:        Patient has prior history of Echocardiogram examinations, most                 recent 02/03/2017. Signs/Symptoms:Shortness of Breath.  Sonographer:    Merrie Roof RDCS Referring Phys: [id number] [place]  1. Left ventricular ejection fraction, by estimation, is 65 to 70%. The left ventricle has normal function. The left ventricle has no regional wall motion abnormalities. Left ventricular diastolic parameters are consistent with Grade I diastolic dysfunction (impaired relaxation).  2. Right ventricular systolic function is normal. The right ventricular size is normal. Tricuspid regurgitation signal is inadequate for assessing PA pressure.  3. The mitral valve is grossly normal. No evidence of mitral valve regurgitation. No evidence of mitral stenosis.  4. The aortic valve is tricuspid. Aortic valve regurgitation is not visualized. No aortic stenosis is present.  5. The inferior vena cava is normal in size with greater than 50% respiratory variability, suggesting right atrial pressure of 3 mmHg.  Conclusion(s)/Recommendation(s): Normal biventricular function without evidence of hemodynamically significant valvular heart disease. FINDINGS  Left Ventricle: Left ventricular ejection fraction, by estimation, is 65 to 70%. The left ventricle has normal function. The left ventricle has no regional wall motion abnormalities. The left ventricular internal cavity size was normal in size. There is  no left ventricular hypertrophy.  Left ventricular diastolic parameters are consistent with Grade I diastolic dysfunction (impaired relaxation). Right Ventricle: The right ventricular size is normal. No increase in right ventricular wall thickness. Right ventricular systolic function is normal. Tricuspid regurgitation signal is inadequate for assessing PA pressure. Left Atrium: Left atrial size was normal in size. Right Atrium: Right atrial size was normal in size. Pericardium: There is no evidence of pericardial effusion. Presence of epicardial fat layer. Mitral Valve: The mitral valve is grossly normal. No evidence of mitral valve regurgitation. No evidence of mitral valve stenosis. Tricuspid Valve: The tricuspid valve is grossly normal. Tricuspid valve regurgitation is trivial. No evidence of tricuspid stenosis. Aortic Valve: The aortic valve is tricuspid. Aortic valve regurgitation is not visualized. No aortic stenosis is present. Pulmonic Valve: The pulmonic valve was grossly normal. Pulmonic valve regurgitation is trivial. No evidence of pulmonic stenosis. Aorta: The aortic root and ascending aorta are structurally normal, with no evidence of dilitation. Venous: The inferior vena cava is normal in size with greater than 50% respiratory variability, suggesting right atrial pressure of 3 mmHg. IAS/Shunts: The atrial septum is grossly normal.  LEFT VENTRICLE PLAX 2D LVIDd:         4.40 cm   Diastology LVIDs:         2.90 cm   LV e' medial:    5.66 cm/s LV PW:         1.00 cm   LV E/e' medial:  17.2 LV IVS:        1.10 cm    LV e' lateral:   6.96 cm/s LVOT diam:     1.80 cm   LV E/e' lateral: 14.0 LV SV:         64 LV SV Index:   35 LVOT Area:     2.54 cm  RIGHT VENTRICLE RV Basal diam:  3.30 cm RV S prime:     12.80 cm/s TAPSE (M-mode): 1.8 cm LEFT ATRIUM             Index        RIGHT ATRIUM           Index LA diam:        4.30 cm 2.36 cm/m   RA Area:     10.90 cm LA Vol (A2C):   37.8 ml 20.72 ml/m  RA Volume:   20.50 ml  11.24 ml/m LA Vol (A4C):   53.0 ml 29.05 ml/m LA Biplane Vol: 46.1 ml 25.27 ml/m  AORTIC VALVE LVOT Vmax:   132.00 cm/s LVOT Vmean:  89.900 cm/s LVOT VTI:    0.252 m  AORTA Ao Root diam: 2.70 cm Ao Asc diam:  3.40 cm Ao Desc diam: 2.20 cm MITRAL VALVE MV Area (PHT): 3.65 cm     SHUNTS MV Decel Time: 208 msec     Systemic VTI:  0.25 m MV E velocity: 97.10 cm/s   Systemic Diam: 1.80 cm MV A velocity: 122.00 cm/s MV E/A ratio:  0.80 Eleonore Chiquito MD Electronically signed by Eleonore Chiquito MD Signature Date/Time: 07/30/2022/1:31:54 PM    Final    VAS [place] LOWER EXTREMITY VENOUS (DVT)  Result Date: 07/30/2022  Lower Venous DVT Study Patient Name:  Sara Mcdonald  Date of Exam:   07/29/2022 Medical Rec #: [id number]         Accession #:    [id number] Date of Birth: [birth year]         Patient Gender: F Patient Age:   41 years Exam  Location:  [facility] Procedure:      VAS [place] LOWER EXTREMITY VENOUS (DVT) Referring Phys: ERIC [place] --------------------------------------------------------------------------------  Indications: Swelling.  Comparison Study: No previous exam noted. Performing Technologist: Bobetta Lime BS, RVT  Examination Guidelines: A complete evaluation includes B-mode imaging, spectral Doppler, color Doppler, and power Doppler as needed of all accessible portions of each vessel. Bilateral testing is considered an integral part of a complete examination. Limited examinations for reoccurring indications may be performed as noted. The reflux portion of the exam is performed with the patient in  reverse Trendelenburg.  +---------+---------------+---------+-----------+----------+--------------+ RIGHT    CompressibilityPhasicitySpontaneityPropertiesThrombus Aging +---------+---------------+---------+-----------+----------+--------------+ CFV      Full           Yes      Yes                                 +---------+---------------+---------+-----------+----------+--------------+ SFJ      Full                                                        +---------+---------------+---------+-----------+----------+--------------+ FV Prox  Full                                                        +---------+---------------+---------+-----------+----------+--------------+ FV Mid   Full                                                        +---------+---------------+---------+-----------+----------+--------------+ FV DistalFull                                                        +---------+---------------+---------+-----------+----------+--------------+ PFV      Full                                                        +---------+---------------+---------+-----------+----------+--------------+ POP      Full           Yes      Yes                                 +---------+---------------+---------+-----------+----------+--------------+ PTV      Full                                                        +---------+---------------+---------+-----------+----------+--------------+ PERO     Full                                                        +---------+---------------+---------+-----------+----------+--------------+   +---------+---------------+---------+-----------+----------+--------------+  LEFT     CompressibilityPhasicitySpontaneityPropertiesThrombus Aging +---------+---------------+---------+-----------+----------+--------------+ CFV      Full           Yes      Yes                                  +---------+---------------+---------+-----------+----------+--------------+ SFJ      Full                                                        +---------+---------------+---------+-----------+----------+--------------+ FV Prox  Full                                                        +---------+---------------+---------+-----------+----------+--------------+ FV Mid   Full                                                        +---------+---------------+---------+-----------+----------+--------------+ FV DistalFull                                                        +---------+---------------+---------+-----------+----------+--------------+ PFV      Full                                                        +---------+---------------+---------+-----------+----------+--------------+ POP      Full           Yes      Yes                                 +---------+---------------+---------+-----------+----------+--------------+ PTV      Full                                                        +---------+---------------+---------+-----------+----------+--------------+ PERO     Full                                                        +---------+---------------+---------+-----------+----------+--------------+     Summary: BILATERAL: - No evidence of deep vein thrombosis seen in the lower extremities, bilaterally. -No evidence of popliteal cyst, bilaterally.   *See table(s) above for measurements and observations. Electronically signed by Orlie Pollen on 07/30/2022 at 12:31:27 PM.  Final       LOS: 6 days    Eulogio Bear DO [facility] 07/31/2022, 1:23 PM   If 7PM-7AM, please contact night-coverage [email or web address]

## 2022-08-01 ENCOUNTER — Telehealth (HOSPITAL_COMMUNITY): Payer: Self-pay | Admitting: Pharmacy Technician

## 2022-08-01 ENCOUNTER — Other Ambulatory Visit (HOSPITAL_COMMUNITY): Payer: Self-pay

## 2022-08-01 ENCOUNTER — Inpatient Hospital Stay (HOSPITAL_COMMUNITY): Payer: PPO

## 2022-08-01 DIAGNOSIS — K567 Ileus, unspecified: Secondary | ICD-10-CM | POA: Diagnosis not present

## 2022-08-01 LAB — CBC
HCT: 31.8 % — ABNORMAL LOW (ref 36.0–46.0)
Hemoglobin: 9.7 g/dL — ABNORMAL LOW (ref 12.0–15.0)
MCH: 29 pg (ref 26.0–34.0)
MCHC: 30.5 g/dL (ref 30.0–36.0)
MCV: 94.9 fL (ref 80.0–100.0)
Platelets: 270 10*3/uL (ref 150–400)
RBC: 3.35 MIL/uL — ABNORMAL LOW (ref 3.87–5.11)
RDW: 14.1 % (ref 11.5–15.5)
WBC: 7.6 10*3/uL (ref 4.0–10.5)
nRBC: 0 % (ref 0.0–0.2)

## 2022-08-01 LAB — IRON AND TIBC
Iron: 39 ug/dL (ref 28–170)
Saturation Ratios: 14 % (ref 10.4–31.8)
TIBC: 280 ug/dL (ref 250–450)
UIBC: 241 ug/dL

## 2022-08-01 LAB — BASIC METABOLIC PANEL
Anion gap: 9 (ref 5–15)
BUN: 10 mg/dL (ref 8–23)
CO2: 26 mmol/L (ref 22–32)
Calcium: 7.9 mg/dL — ABNORMAL LOW (ref 8.9–10.3)
Chloride: 103 mmol/L (ref 98–111)
Creatinine, Ser: 1.28 mg/dL — ABNORMAL HIGH (ref 0.44–1.00)
GFR, Estimated: 43 mL/min — ABNORMAL LOW (ref >60)
Glucose, Bld: 135 mg/dL — ABNORMAL HIGH (ref 70–99)
Potassium: 3.7 mmol/L (ref 3.5–5.1)
Sodium: 138 mmol/L (ref 135–145)

## 2022-08-01 LAB — GLUCOSE, CAPILLARY
Glucose-Capillary: 145 mg/dL — ABNORMAL HIGH (ref 70–99)
Glucose-Capillary: 141 mg/dL — ABNORMAL HIGH (ref 70–99)
Glucose-Capillary: 158 mg/dL — ABNORMAL HIGH (ref 70–99)
Glucose-Capillary: 208 mg/dL — ABNORMAL HIGH (ref 70–99)

## 2022-08-01 LAB — FERRITIN: Ferritin: 111 ng/mL (ref 11–307)

## 2022-08-01 LAB — SURGICAL PATHOLOGY

## 2022-08-01 MED ORDER — FUROSEMIDE 10 MG/ML IJ SOLN
40.0000 mg | Freq: Every day | INTRAMUSCULAR | Status: DC
Start: 2022-08-01 — End: 2022-08-03
  Administered 2022-08-01 – 2022-08-02 (×2): 40 mg via INTRAVENOUS
  Filled 2022-08-01 (×2): qty 4

## 2022-08-01 MED ORDER — PANTOPRAZOLE SODIUM 40 MG PO TBEC
40.0000 mg | DELAYED_RELEASE_TABLET | Freq: Two times a day (BID) | ORAL | Status: DC
  Administered 2022-08-01 – 2022-08-04 (×6): 40 mg via ORAL
  Filled 2022-08-01 (×6): qty 1

## 2022-08-01 MED ORDER — PREDNISONE 20 MG PO TABS
30.0000 mg | ORAL_TABLET | Freq: Every day | ORAL | Status: AC
  Administered 2022-08-01 – 2022-08-03 (×3): 30 mg via ORAL
  Filled 2022-08-01 (×3): qty 1

## 2022-08-01 NOTE — Progress Notes (Signed)
Mobility Specialist Progress Note:   08/01/22 1000  Mobility  Activity Transferred from bed to chair  Level of Assistance Standby assist, set-up cues, supervision of patient - no hands on  Assistive Device None  Distance Ambulated (ft) 4 ft  Activity Response Tolerated well  $Mobility charge 1 Mobility   Pt eager for mobility, however visibly SOB. Session limited d/t transport arrival for XR. Pt able to stand and pivot to w/c with no AD, and min guard assist. Pt left with all needs met, in transit to XR. Will f/u for further mobility.   Nelta Numbers [facility] Secure Chat or Office Phone: [phone]

## 2022-08-01 NOTE — Progress Notes (Signed)
Mobility Specialist Progress Note:   08/01/22 1530  Mobility  Activity Ambulated with assistance in hallway  Level of Assistance Standby assist, set-up cues, supervision of patient - no hands on  Assistive Device Front wheel walker  Distance Ambulated (ft) 275 ft  Activity Response Tolerated well  $Mobility charge 1 Mobility   Pt eager for mobility session. Required up to min guard assist. SpO2 WFL on RA throughout. Pt back in bed with all needs met.   Nelta Numbers Acute Rehab Secure Chat or Office Phone: [phone]

## 2022-08-01 NOTE — Telephone Encounter (Signed)
Pharmacy Patient Advocate Encounter  Insurance verification completed.    The patient is insured through [employer] Medicare Part D   The patient is currently admitted and ran test claims for the following: Eliquis.  Copays and coinsurance results were relayed to Inpatient clinical team.      

## 2022-08-01 NOTE — Progress Notes (Signed)
PROGRESS NOTE    Sara Mcdonald  RKY:[id number] DOB: 1946-12-07 DOA: 07/24/2022 PCP: [name], MD   Brief Narrative: TROY HARTZOG is a 76 y.o. female with a history of diabetes mellitus type 2, fibromyalgia, GERD, sleep apnea, recent L4-L5 decompression surgery, depression, anxiety. Patient presented secondary to abdominal pain with bowel movements with concern for ileus vs inflammatory colitis seen on CT imaging. GI consulted. Having BMs. Hospital stay complicated by a fib and dyspnea.     Assessment and Plan:  Abdominal pain Concern for postoperative colonic ileus vs inflammatory colitis. History of blood per rectum. Patient has been on opiates for her post-operative pain management. GI consulted.  -Follow-up GI recommendations:   Erythematous mucosa in the sigmoid colon and in the distal descending colon. Biopsied.  - Non-bleeding internal hemorrhoids.   - One 4 mm polyp in the transverse colon, removed  with a cold snare. Resected and retrieved. -treated with abx Avoid pain meds-- use heat -bowel regimen- s/p SMOG enema  Dyspnea related to acute diastolic CHF vs asthma -? Related to a fib -wean O2 as able -pleural effusion- if continues may need thoracentesis -will use IV lasix and monitor for response -will add lose dose prednisone -CTA negative for PE -LE duplex negative for DVT  A fib with RVR -BB started -Mali vasc 2- of 4 -- will need anticoagulation to prevent strokes - will start eliquis -echo as above -outpatient cards follow up  Anemia -check Fe levels  History of L4-L5 decompression Recent surgery. Neurosurgery added to treatment team -added lidocaine patch  Diabetes mellitus, type 2 Patient is on metformin as an outpatient, which was held. -Continue SSI  Primary hypertension -Resume olmesartan (irbesartan while inpatient)  Anxiety Depression Patient is on Xanax and Effexor   Hyperlipidemia Pravastatin held on admission  CKD stage  IIIa Creatinine is stable.  Obesity Estimated body mass index is 37.24 kg/m as calculated from the following:   Height as of this encounter: '4\' 11"'$  (1.499 m).   Weight as of this encounter: 83.6 kg.     DVT prophylaxis: SCDs Code Status:   Code Status: Full Code Family Communication: at bedside 8/14 Disposition Plan: home once improved   Consultants:  [facility]    Subjective: Walked yesterday  Objective: BP (!) 141/75 (BP Location: Left Arm)   Pulse 94   Temp 97.9 F (36.6 C) (Oral)   Resp 18   Ht '4\' 11"'$  (1.499 m)   Wt 83.6 kg   SpO2 99%   BMI 37.24 kg/m   Examination:   General: Appearance:    Obese female in no acute distress     Lungs:      respirations unlabored, diminished at bases  Heart:    Normal heart rate.  MS:   All extremities are intact. +LE edema  Neurologic:   Awake, alert          Data Reviewed: I have personally reviewed following labs and imaging studies  CBC Lab Results  Component Value Date   WBC 7.6 08/01/2022   RBC 3.35 (L) 08/01/2022   HGB 9.7 (L) 08/01/2022   HCT 31.8 (L) 08/01/2022   MCV 94.9 08/01/2022   MCH 29.0 08/01/2022   PLT 270 08/01/2022   MCHC 30.5 08/01/2022   RDW 14.1 08/01/2022   LYMPHSABS 0.4 (L) 03/17/2017   MONOABS 0.6 03/17/2017   EOSABS 0.1 03/17/2017   BASOSABS 0.0 31/51/7616     Last metabolic panel Lab Results  Component  Value Date   NA 138 08/01/2022   K 3.7 08/01/2022   CL 103 08/01/2022   CO2 26 08/01/2022   BUN 10 08/01/2022   CREATININE 1.28 (H) 08/01/2022   GLUCOSE 135 (H) 08/01/2022   GFRNONAA 43 (L) 08/01/2022   GFRAA 44 (L) 09/07/2020   CALCIUM 7.9 (L) 08/01/2022   PROT 5.5 (L) 07/25/2022   ALBUMIN 2.5 (L) 07/25/2022   BILITOT 0.7 07/25/2022   ALKPHOS 63 07/25/2022   AST 19 07/25/2022   ALT 11 07/25/2022   ANIONGAP 9 08/01/2022    GFR: Estimated Creatinine Clearance: 35.1 mL/min (A) (by C-G formula based on SCr of 1.28 mg/dL (H)).  Recent Results (from  the past 240 hour(s))  C Difficile Quick Screen w PCR reflex     Status: None   Collection Time: 07/25/22  1:56 AM   Specimen: STOOL  Result Value Ref Range Status   C Diff antigen NEGATIVE NEGATIVE Final   C Diff toxin NEGATIVE NEGATIVE Final   C Diff interpretation No C. difficile detected.  Final    Comment: Performed at [facility], [place] [address], [place], [place] [address]      Radiology Studies: DG Chest 2 View  Result Date: 08/01/2022 CLINICAL DATA:  Dyspnea EXAM: CHEST - 2 VIEW COMPARISON:  July 29, 2022 FINDINGS: Right upper extremity PICC in place, with the tip projecting over the low SVC. Stable cardiomediastinal contours. Low lung volumes with bronchovascular crowding. Bibasilar atelectasis. Increased small bilateral pleural effusions. No large pneumothorax. Surgical clips overlying the right upper quadrant. No acute osseous abnormality. IMPRESSION: Increased small bilateral pleural effusions. Electronically Signed   By: Beryle Flock M.D.   On: 08/01/2022 10:30   ECHOCARDIOGRAM COMPLETE  Result Date: 07/30/2022    ECHOCARDIOGRAM REPORT   Patient Name:   Sara Mcdonald Date of Exam: 07/30/2022 Medical Rec #:  [id number]        Height:       59.0 in Accession #:    [id number]       Weight:       195.0 lb Date of Birth:  06-17-1946        BSA:          1.825 m Patient Age:    49 years         BP:           163/90 mmHg Patient Gender: F                HR:           78 bpm. Exam Location:  Inpatient Procedure: 2D Echo, Cardiac Doppler and Color Doppler Indications:    Pulmonary embolism  History:        Patient has prior history of Echocardiogram examinations, most                 recent 02/03/2017. Signs/Symptoms:Shortness of Breath.  Sonographer:    Merrie Roof RDCS Referring Phys: [id number] [place]  1. Left ventricular ejection fraction, by estimation, is 65 to 70%. The left ventricle has normal function. The left ventricle has no regional wall motion  abnormalities. Left ventricular diastolic parameters are consistent with Grade I diastolic dysfunction (impaired relaxation).  2. Right ventricular systolic function is normal. The right ventricular size is normal. Tricuspid regurgitation signal is inadequate for assessing PA pressure.  3. The mitral valve is grossly normal. No evidence of mitral valve regurgitation. No evidence of mitral stenosis.  4.  The aortic valve is tricuspid. Aortic valve regurgitation is not visualized. No aortic stenosis is present.  5. The inferior vena cava is normal in size with greater than 50% respiratory variability, suggesting right atrial pressure of 3 mmHg. Conclusion(s)/Recommendation(s): Normal biventricular function without evidence of hemodynamically significant valvular heart disease. FINDINGS  Left Ventricle: Left ventricular ejection fraction, by estimation, is 65 to 70%. The left ventricle has normal function. The left ventricle has no regional wall motion abnormalities. The left ventricular internal cavity size was normal in size. There is  no left ventricular hypertrophy. Left ventricular diastolic parameters are consistent with Grade I diastolic dysfunction (impaired relaxation). Right Ventricle: The right ventricular size is normal. No increase in right ventricular wall thickness. Right ventricular systolic function is normal. Tricuspid regurgitation signal is inadequate for assessing PA pressure. Left Atrium: Left atrial size was normal in size. Right Atrium: Right atrial size was normal in size. Pericardium: There is no evidence of pericardial effusion. Presence of epicardial fat layer. Mitral Valve: The mitral valve is grossly normal. No evidence of mitral valve regurgitation. No evidence of mitral valve stenosis. Tricuspid Valve: The tricuspid valve is grossly normal. Tricuspid valve regurgitation is trivial. No evidence of tricuspid stenosis. Aortic Valve: The aortic valve is tricuspid. Aortic valve regurgitation  is not visualized. No aortic stenosis is present. Pulmonic Valve: The pulmonic valve was grossly normal. Pulmonic valve regurgitation is trivial. No evidence of pulmonic stenosis. Aorta: The aortic root and ascending aorta are structurally normal, with no evidence of dilitation. Venous: The inferior vena cava is normal in size with greater than 50% respiratory variability, suggesting right atrial pressure of 3 mmHg. IAS/Shunts: The atrial septum is grossly normal.  LEFT VENTRICLE PLAX 2D LVIDd:         4.40 cm   Diastology LVIDs:         2.90 cm   LV e' medial:    5.66 cm/s LV PW:         1.00 cm   LV E/e' medial:  17.2 LV IVS:        1.10 cm   LV e' lateral:   6.96 cm/s LVOT diam:     1.80 cm   LV E/e' lateral: 14.0 LV SV:         64 LV SV Index:   35 LVOT Area:     2.54 cm  RIGHT VENTRICLE RV Basal diam:  3.30 cm RV S prime:     12.80 cm/s TAPSE (M-mode): 1.8 cm LEFT ATRIUM             Index        RIGHT ATRIUM           Index LA diam:        4.30 cm 2.36 cm/m   RA Area:     10.90 cm LA Vol (A2C):   37.8 ml 20.72 ml/m  RA Volume:   20.50 ml  11.24 ml/m LA Vol (A4C):   53.0 ml 29.05 ml/m LA Biplane Vol: 46.1 ml 25.27 ml/m  AORTIC VALVE LVOT Vmax:   132.00 cm/s LVOT Vmean:  89.900 cm/s LVOT VTI:    0.252 m  AORTA Ao Root diam: 2.70 cm Ao Asc diam:  3.40 cm Ao Desc diam: 2.20 cm MITRAL VALVE MV Area (PHT): 3.65 cm     SHUNTS MV Decel Time: 208 msec     Systemic VTI:  0.25 m MV E velocity: 97.10 cm/s   Systemic Diam: 1.80 cm MV  A velocity: 122.00 cm/s MV E/A ratio:  0.80 Eleonore Chiquito MD Electronically signed by Eleonore Chiquito MD Signature Date/Time: 07/30/2022/1:31:54 PM    Final       LOS: 7 days    Eulogio Bear DO [facility] 08/01/2022, 12:51 PM   If 7PM-7AM, please contact night-coverage [email or web address]

## 2022-08-01 NOTE — TOC Benefit Eligibility Note (Signed)
Patient Teacher, English as a foreign language completed.    The patient is currently admitted and upon discharge could be taking Eliquis 5 mg.  The current 30 day co-pay is $40.00.   The patient is insured through [place], [place] Patient Advocate Specialist [place] Patient Advocate Team Direct Number: [phone]  Fax: [phone]

## 2022-08-01 NOTE — Plan of Care (Signed)
  Problem: Education: Goal: Knowledge of General Education information will improve Description Including pain rating scale, medication(s)/side effects and non-pharmacologic comfort measures Outcome: Progressing   Problem: Health Behavior/Discharge Planning: Goal: Ability to manage health-related needs will improve Outcome: Progressing   

## 2022-08-02 DIAGNOSIS — K567 Ileus, unspecified: Secondary | ICD-10-CM | POA: Diagnosis not present

## 2022-08-02 LAB — BASIC METABOLIC PANEL
Anion gap: 6 (ref 5–15)
BUN: 15 mg/dL (ref 8–23)
CO2: 25 mmol/L (ref 22–32)
Calcium: 8.2 mg/dL — ABNORMAL LOW (ref 8.9–10.3)
Chloride: 106 mmol/L (ref 98–111)
Creatinine, Ser: 1.21 mg/dL — ABNORMAL HIGH (ref 0.44–1.00)
GFR, Estimated: 46 mL/min — ABNORMAL LOW (ref >60)
Glucose, Bld: 170 mg/dL — ABNORMAL HIGH (ref 70–99)
Potassium: 3.3 mmol/L — ABNORMAL LOW (ref 3.5–5.1)
Sodium: 137 mmol/L (ref 135–145)

## 2022-08-02 LAB — CBC
HCT: 29.5 % — ABNORMAL LOW (ref 36.0–46.0)
Hemoglobin: 9.2 g/dL — ABNORMAL LOW (ref 12.0–15.0)
MCH: 29.4 pg (ref 26.0–34.0)
MCHC: 31.2 g/dL (ref 30.0–36.0)
MCV: 94.2 fL (ref 80.0–100.0)
Platelets: 261 10*3/uL (ref 150–400)
RBC: 3.13 MIL/uL — ABNORMAL LOW (ref 3.87–5.11)
RDW: 13.9 % (ref 11.5–15.5)
WBC: 7.4 10*3/uL (ref 4.0–10.5)
nRBC: 0 % (ref 0.0–0.2)

## 2022-08-02 LAB — GLUCOSE, CAPILLARY
Glucose-Capillary: 107 mg/dL — ABNORMAL HIGH (ref 70–99)
Glucose-Capillary: 158 mg/dL — ABNORMAL HIGH (ref 70–99)
Glucose-Capillary: 169 mg/dL — ABNORMAL HIGH (ref 70–99)
Glucose-Capillary: 164 mg/dL — ABNORMAL HIGH (ref 70–99)

## 2022-08-02 MED ORDER — POLYETHYLENE GLYCOL 3350 17 G PO PACK: 17.0000 g | PACK | Freq: Every day | ORAL | Status: DC | PRN

## 2022-08-02 MED ORDER — POTASSIUM CHLORIDE CRYS ER 20 MEQ PO TBCR
40.0000 meq | EXTENDED_RELEASE_TABLET | Freq: Once | ORAL | Status: AC
Start: 2022-08-02 — End: 2022-08-02
  Administered 2022-08-02: 40 meq via ORAL
  Filled 2022-08-02: qty 2

## 2022-08-02 NOTE — Progress Notes (Signed)
Mobility Specialist Progress Note:   08/02/22 1145  Mobility  Activity  (bed level exercises)  Range of Motion/Exercises Active;All extremities  Activity Response Tolerated fair  $Mobility charge 1 Mobility   2nd attempt for mobility this am. Pt in significant back and abdominal pain. Politely declined any OOB mobility at this time. Pt agreeable to bed level exercises. Increased pain when performing each one. Further mobility deferred. Pt left with all needs met.     Acute Rehab Secure Chat or Office Phone: [phone]  

## 2022-08-02 NOTE — Plan of Care (Signed)

## 2022-08-02 NOTE — Progress Notes (Signed)
Patient refused ambulation with mobility tech. Nurse offered pain meds and patient stated that she wanted to try her best to stay away from taking any pain meds at this time. Will try mobility again later.

## 2022-08-02 NOTE — Plan of Care (Signed)
  Problem: Coping: Goal: Ability to adjust to condition or change in health will improve Outcome: Progressing   

## 2022-08-02 NOTE — Progress Notes (Signed)
PROGRESS NOTE    Sara Mcdonald  EHU:[id number] DOB: Dec 04, 1946 DOA: 07/24/2022 PCP: [name], MD   Brief Narrative: Sara Mcdonald is a 76 y.o. female with a history of diabetes mellitus type 2, fibromyalgia, GERD, sleep apnea, recent L4-L5 decompression surgery, depression, anxiety. Patient presented secondary to abdominal pain with bowel movements with concern for ileus vs inflammatory colitis seen on CT imaging. GI consulted. Having BMs. Hospital stay complicated by a fib and dyspnea due to volume overload. Improving and suspect can be d/c'd in 24-48 hours.   Assessment and Plan:  Abdominal pain -had colonoscopy that showed: ischemic colitis  -treated with abx Avoiding pain meds-- use heat -bowel regimen  Dyspnea related to acute diastolic CHF vs asthma -weaned off O2  -pleural effusion- if continues may need thoracentesis- recheck x ray in AM -will use IV lasix daily -will add lose dose prednisone -CTA negative for PE -LE duplex negative for DVT  A fib with RVR -BB started -Mali vasc 2- of 4 -- will need anticoagulation to prevent strokes - will start eliquis (especially in light of ischemic colitis) -echo as above -outpatient follow up with PCP  Anemia -Fe 39  History of L4-L5 decompression Recent surgery. Neurosurgery added to treatment team and has seen patient-- they are ok with blood thinner -added lidocaine patch  Diabetes mellitus, type 2 Patient is on metformin as an outpatient, which was held. -Continue SSI  Primary hypertension -Resume olmesartan (irbesartan while inpatient)  Anxiety Depression Patient is on Xanax and Effexor   Hyperlipidemia Pravastatin held on admission  CKD stage IIIa Creatinine is stable.  Obesity Estimated body mass index is 38.69 kg/m as calculated from the following:   Height as of this encounter: '4\' 11"'$  (1.499 m).   Weight as of this encounter: 86.9 kg.     DVT prophylaxis: SCDs Code Status:   Code  Status: Full Code Family Communication: at bedside 8/14 Disposition Plan: home 24-48 hours   Consultants:  [facility]    Subjective: Breathing better today- not on O2-- walked yesterday off O2 as well  Objective: BP 136/79 (BP Location: Left Arm)   Pulse 90   Temp (!) 97.4 F (36.3 C) (Oral)   Resp 18   Ht '4\' 11"'$  (1.499 m)   Wt 86.9 kg   SpO2 98%   BMI 38.69 kg/m   Examination:   General: Appearance:    Obese female in no acute distress     Lungs:     Diminished at bases, respirations unlabored  Heart:    Normal heart rate. .   MS:   All extremities are intact.   Neurologic:   Awake, alert        Data Reviewed: I have personally reviewed following labs and imaging studies  CBC Lab Results  Component Value Date   WBC 7.4 08/02/2022   RBC 3.13 (L) 08/02/2022   HGB 9.2 (L) 08/02/2022   HCT 29.5 (L) 08/02/2022   MCV 94.2 08/02/2022   MCH 29.4 08/02/2022   PLT 261 08/02/2022   MCHC 31.2 08/02/2022   RDW 13.9 08/02/2022   LYMPHSABS 0.4 (L) 03/17/2017   MONOABS 0.6 03/17/2017   EOSABS 0.1 03/17/2017   BASOSABS 0.0 78/58/8502     Last metabolic panel Lab Results  Component Value Date   NA 137 08/02/2022   K 3.3 (L) 08/02/2022   CL 106 08/02/2022   CO2 25 08/02/2022   BUN 15 08/02/2022   CREATININE 1.21 (H)  08/02/2022   GLUCOSE 170 (H) 08/02/2022   GFRNONAA 46 (L) 08/02/2022   GFRAA 44 (L) 09/07/2020   CALCIUM 8.2 (L) 08/02/2022   PROT 5.5 (L) 07/25/2022   ALBUMIN 2.5 (L) 07/25/2022   BILITOT 0.7 07/25/2022   ALKPHOS 63 07/25/2022   AST 19 07/25/2022   ALT 11 07/25/2022   ANIONGAP 6 08/02/2022    GFR: Estimated Creatinine Clearance: 37.9 mL/min (A) (by C-G formula based on SCr of 1.21 mg/dL (H)).  Recent Results (from the past 240 hour(s))  C Difficile Quick Screen w PCR reflex     Status: None   Collection Time: 07/25/22  1:56 AM   Specimen: STOOL  Result Value Ref Range Status   C Diff antigen NEGATIVE NEGATIVE Final   C  Diff toxin NEGATIVE NEGATIVE Final   C Diff interpretation No C. difficile detected.  Final    Comment: Performed at [facility], [place] [address], [place],  [address]      Radiology Studies: DG Chest 2 View  Result Date: 08/01/2022 CLINICAL DATA:  Dyspnea EXAM: CHEST - 2 VIEW COMPARISON:  July 29, 2022 FINDINGS: Right upper extremity PICC in place, with the tip projecting over the low SVC. Stable cardiomediastinal contours. Low lung volumes with bronchovascular crowding. Bibasilar atelectasis. Increased small bilateral pleural effusions. No large pneumothorax. Surgical clips overlying the right upper quadrant. No acute osseous abnormality. IMPRESSION: Increased small bilateral pleural effusions. Electronically Signed   By: Beryle Flock M.D.   On: 08/01/2022 10:30      LOS: 8 days    Eulogio Bear DO [facility] 08/02/2022, 11:53 AM   If 7PM-7AM, please contact night-coverage [email or web address]

## 2022-08-03 ENCOUNTER — Inpatient Hospital Stay (HOSPITAL_COMMUNITY): Payer: PPO

## 2022-08-03 DIAGNOSIS — K567 Ileus, unspecified: Secondary | ICD-10-CM | POA: Diagnosis not present

## 2022-08-03 DIAGNOSIS — Z981 Arthrodesis status: Secondary | ICD-10-CM | POA: Diagnosis not present

## 2022-08-03 DIAGNOSIS — I129 Hypertensive chronic kidney disease with stage 1 through stage 4 chronic kidney disease, or unspecified chronic kidney disease: Secondary | ICD-10-CM | POA: Diagnosis not present

## 2022-08-03 DIAGNOSIS — N183 Chronic kidney disease, stage 3 unspecified: Secondary | ICD-10-CM | POA: Diagnosis not present

## 2022-08-03 DIAGNOSIS — Z4789 Encounter for other orthopedic aftercare: Secondary | ICD-10-CM | POA: Diagnosis not present

## 2022-08-03 DIAGNOSIS — Z7984 Long term (current) use of oral hypoglycemic drugs: Secondary | ICD-10-CM | POA: Diagnosis not present

## 2022-08-03 DIAGNOSIS — E1122 Type 2 diabetes mellitus with diabetic chronic kidney disease: Secondary | ICD-10-CM | POA: Diagnosis not present

## 2022-08-03 DIAGNOSIS — M797 Fibromyalgia: Secondary | ICD-10-CM | POA: Diagnosis not present

## 2022-08-03 LAB — BASIC METABOLIC PANEL
Anion gap: 7 (ref 5–15)
BUN: 19 mg/dL (ref 8–23)
CO2: 25 mmol/L (ref 22–32)
Calcium: 8.4 mg/dL — ABNORMAL LOW (ref 8.9–10.3)
Chloride: 107 mmol/L (ref 98–111)
Creatinine, Ser: 1.37 mg/dL — ABNORMAL HIGH (ref 0.44–1.00)
GFR, Estimated: 40 mL/min — ABNORMAL LOW (ref >60)
Glucose, Bld: 136 mg/dL — ABNORMAL HIGH (ref 70–99)
Potassium: 3.1 mmol/L — ABNORMAL LOW (ref 3.5–5.1)
Sodium: 139 mmol/L (ref 135–145)

## 2022-08-03 LAB — GLUCOSE, CAPILLARY
Glucose-Capillary: 96 mg/dL (ref 70–99)
Glucose-Capillary: 173 mg/dL — ABNORMAL HIGH (ref 70–99)
Glucose-Capillary: 182 mg/dL — ABNORMAL HIGH (ref 70–99)
Glucose-Capillary: 156 mg/dL — ABNORMAL HIGH (ref 70–99)

## 2022-08-03 LAB — CBC
HCT: 31.5 % — ABNORMAL LOW (ref 36.0–46.0)
Hemoglobin: 9.5 g/dL — ABNORMAL LOW (ref 12.0–15.0)
MCH: 29 pg (ref 26.0–34.0)
MCHC: 30.2 g/dL (ref 30.0–36.0)
MCV: 96 fL (ref 80.0–100.0)
Platelets: 286 10*3/uL (ref 150–400)
RBC: 3.28 MIL/uL — ABNORMAL LOW (ref 3.87–5.11)
RDW: 13.8 % (ref 11.5–15.5)
WBC: 8.6 10*3/uL (ref 4.0–10.5)
nRBC: 0 % (ref 0.0–0.2)

## 2022-08-03 LAB — MAGNESIUM: Magnesium: 1.8 mg/dL (ref 1.7–2.4)

## 2022-08-03 MED ORDER — FUROSEMIDE 40 MG PO TABS
40.0000 mg | ORAL_TABLET | Freq: Every day | ORAL | Status: DC
  Administered 2022-08-03 – 2022-08-04 (×2): 40 mg via ORAL
  Filled 2022-08-03 (×2): qty 1

## 2022-08-03 MED ORDER — POTASSIUM CHLORIDE CRYS ER 20 MEQ PO TBCR
40.0000 meq | EXTENDED_RELEASE_TABLET | Freq: Two times a day (BID) | ORAL | Status: DC
  Administered 2022-08-03 – 2022-08-04 (×3): 40 meq via ORAL
  Filled 2022-08-03 (×3): qty 2

## 2022-08-03 MED ORDER — PREDNISONE 20 MG PO TABS
30.0000 mg | ORAL_TABLET | Freq: Every day | ORAL | Status: DC
  Administered 2022-08-04: 30 mg via ORAL
  Filled 2022-08-03: qty 1

## 2022-08-03 MED ORDER — METFORMIN HCL 500 MG PO TABS
500.0000 mg | ORAL_TABLET | Freq: Every day | ORAL | Status: DC
  Administered 2022-08-03 – 2022-08-04 (×2): 500 mg via ORAL
  Filled 2022-08-03 (×2): qty 1

## 2022-08-03 NOTE — Progress Notes (Signed)
Subjective: The patient is alert and pleasant.  She looks and feels better.  Objective: Vital signs in last 24 hours: Temp:  [97.6 F (36.4 C)-98.9 F (37.2 C)] 98.3 F (36.8 C) (08/16 0753) Pulse Rate:  [86-97] 90 (08/16 0753) Resp:  [16-18] 16 (08/16 0753) BP: (132-142)/(63-79) 137/63 (08/16 0753) SpO2:  [98 %] 98 % (08/16 0753) Weight:  [83.8 kg-85.4 kg] 85.4 kg (08/16 0500) Estimated body mass index is 38.03 kg/m as calculated from the following:   Height as of this encounter: '4\' 11"'$  (1.499 m).   Weight as of this encounter: 85.4 kg.   Intake/Output from previous day: 08/15 0701 - 08/16 0700 In: 640 [P.O.:640] Out: 1100 [Urine:1100] Intake/Output this shift: No intake/output data recorded.  Physical exam patient is alert and oriented.  Her strength is normal.  Lab Results: Recent Labs    08/02/22 0307 08/03/22 0424  WBC 7.4 8.6  HGB 9.2* 9.5*  HCT 29.5* 31.5*  PLT 261 286   BMET Recent Labs    08/02/22 0307 08/03/22 0424  NA 137 139  K 3.3* 3.1*  CL 106 107  CO2 25 25  GLUCOSE 170* 136*  BUN 15 19  CREATININE 1.21* 1.37*  CALCIUM 8.2* 8.4*    Studies/Results: DG Chest 2 View  Result Date: 08/03/2022 CLINICAL DATA:  Evaluate for pleural effusion. EXAM: CHEST - 2 VIEW COMPARISON:  08/01/2022 FINDINGS: There is a right arm PICC line with tip in the projection of the distal SVC. Stable cardiomediastinal contours. Small bilateral pleural effusions are again noted and appear unchanged from the previous exam. Scar versus atelectasis within the right base is unchanged. No new findings. IMPRESSION: 1. Stable small bilateral pleural effusions. 2. Right base scarring versus atelectasis. Electronically Signed   By: Kerby Moors M.D.   On: 08/03/2022 08:27   DG Chest 2 View  Result Date: 08/01/2022 CLINICAL DATA:  Dyspnea EXAM: CHEST - 2 VIEW COMPARISON:  July 29, 2022 FINDINGS: Right upper extremity PICC in place, with the tip projecting over the low SVC.  Stable cardiomediastinal contours. Low lung volumes with bronchovascular crowding. Bibasilar atelectasis. Increased small bilateral pleural effusions. No large pneumothorax. Surgical clips overlying the right upper quadrant. No acute osseous abnormality. IMPRESSION: Increased small bilateral pleural effusions. Electronically Signed   By: Beryle Flock M.D.   On: 08/01/2022 10:30    Assessment/Plan: Lumbar fusion: She is progressing well.  Ileus: It sounds like it is resolving.  A-fib: Noted  LOS: 9 days     Sara Mcdonald 08/03/2022, 10:04 AM     Patient ID: Sara Mcdonald, female   DOB: 1946/12/15, 76 y.o.   MRN: [id number]

## 2022-08-03 NOTE — Progress Notes (Signed)
Error in previous note for patients ambulation today. Patient ambulated twice today with PT in hallway. 243f, and [id number] Patient tolerated walking well.

## 2022-08-03 NOTE — Progress Notes (Signed)
PROGRESS NOTE   Sara Mcdonald  DGL:[id number] DOB: 05-Dec-1946 DOA: 07/24/2022 PCP: [name], MD  Brief Narrative:   76 year old white female known history DM TY 2 sleep apnea and recent L4-5 decompression surgery bipolar Admitted with abdominal pain GI consulted after CT scan showed ileus versus inflammatory colitis She subsequently developed A-fib with RVR and volume overload with chest x-ray showing pleural effusions    Hospital-Problem based course  Abdominal pain secondary to ischemic colitis Status post colonoscopy 8/11 showing the same on pathology Polyp was found which was benign Also hemorrhoids were found-antibiotics were discontinued She is stabilizing and has been graduated to regular diet A-fib RVR this admission with HFpEF EF 65-70% Patient started on Lopressor 50 twice daily-continues on Eliquis twice daily without any concerns for bleed Patient was diuresed 1.5 L aggressively with Lasix IV 40 daily which was transitioned to oral Lasix She does not have an oxygen requirement Pleural effusion asthma exacerbation X-rays performed 8/14 8/15 are almost identical per my overview We will change diuresis to oral and patient has been encouraged to ambulate Patient was given prednisone 30 mg 3 times daily I will complete a 7-day course and reorder this DM TY 2 Metformin 500 resumed 8/16 Continue sliding scale coverage-sugars ranging 96-164 Recent L4-L5 decompression Seems stable at this time will review new wound prior to discharge Can use Flexeril as an outpatient 5 mg 3 times daily and Percocet-Will need outpatient follow-up with [name] for refills HTN Holding Benicar 20 daily and Maxide 1 tablet daily for now can reassess as an outpatient  DVT prophylaxis: Eliquis which was started this admission Code Status: Full Family Communication: None present patient alone Disposition:  Status is: Inpatient Remains inpatient appropriate because:   Still wheezy  but likely can discharge home if ambulatory tomorrow   Consultants:  Gastroenterology  Procedures: None  Antimicrobials: No   Subjective: Awake coherent pleasant not eating a whole lot was quite "weak" yesterday and felt a little bit winded Feels a little better today Mention to her that she was being diuresed and that her potassium is low which could have caused this  Objective: Vitals:   08/02/22 1729 08/02/22 2002 08/03/22 0500 08/03/22 0753  BP: 135/75 (!) 142/71 132/79 137/63  Pulse: 94 97 86 90  Resp: '18 16 17 16  '$ Temp: 98.9 F (37.2 C) 98 F (36.7 C) 97.6 F (36.4 C) 98.3 F (36.8 C)  TempSrc: Oral Oral Oral Oral  SpO2: 98% 98% 98% 98%  Weight:   85.4 kg   Height:        Intake/Output Summary (Last 24 hours) at 08/03/2022 0943 Last data filed at 08/02/2022 1730 Gross per 24 hour  Intake 640 ml  Output 1100 ml  Net -460 ml   Filed Weights   08/02/22 0510 08/02/22 1218 08/03/22 0500  Weight: 86.9 kg 83.8 kg 85.4 kg    Examination:  EOMI NCAT no focal deficit no icterus no pallor no rales no rhonchi no wheeze Chest i has bilateral wheezes  ROM intact Neurologically intact moving 4 limbs equally Power is 5/5 S1-S2 no murmur   Data Reviewed: personally reviewed   CBC    Component Value Date/Time   WBC 8.6 08/03/2022 0424   RBC 3.28 (L) 08/03/2022 0424   HGB 9.5 (L) 08/03/2022 0424   HGB 13.7 08/11/2014 1310   HCT 31.5 (L) 08/03/2022 0424   HCT 42.2 08/11/2014 1310   PLT 286 08/03/2022 0424   PLT 202 08/11/2014  1310   MCV 96.0 08/03/2022 0424   MCV 92 08/11/2014 1310   MCH 29.0 08/03/2022 0424   MCHC 30.2 08/03/2022 0424   RDW 13.8 08/03/2022 0424   RDW 13.1 08/11/2014 1310   LYMPHSABS 0.4 (L) 03/17/2017 1214   LYMPHSABS 2.4 08/11/2014 1310   MONOABS 0.6 03/17/2017 1214   MONOABS 1.2 (H) 08/11/2014 1310   EOSABS 0.1 03/17/2017 1214   EOSABS 0.5 08/11/2014 1310   BASOSABS 0.0 03/17/2017 1214   BASOSABS 0.1 08/11/2014 1310      Latest  Ref Rng & Units 08/03/2022    4:24 AM 08/02/2022    3:07 AM 08/01/2022    2:11 AM  CMP  Glucose 70 - 99 mg/dL 136  170  135   BUN 8 - 23 mg/dL '19  15  10   '$ Creatinine 0.44 - 1.00 mg/dL 1.37  1.21  1.28   Sodium 135 - 145 mmol/L 139  137  138   Potassium 3.5 - 5.1 mmol/L 3.1  3.3  3.7   Chloride 98 - 111 mmol/L 107  106  103   CO2 22 - 32 mmol/L '25  25  26   '$ Calcium 8.9 - 10.3 mg/dL 8.4  8.2  7.9      Radiology Studies: DG Chest 2 View  Result Date: 08/03/2022 CLINICAL DATA:  Evaluate for pleural effusion. EXAM: CHEST - 2 VIEW COMPARISON:  08/01/2022 FINDINGS: There is a right arm PICC line with tip in the projection of the distal SVC. Stable cardiomediastinal contours. Small bilateral pleural effusions are again noted and appear unchanged from the previous exam. Scar versus atelectasis within the right base is unchanged. No new findings. IMPRESSION: 1. Stable small bilateral pleural effusions. 2. Right base scarring versus atelectasis. Electronically Signed   By: Kerby Moors M.D.   On: 08/03/2022 08:27   DG Chest 2 View  Result Date: 08/01/2022 CLINICAL DATA:  Dyspnea EXAM: CHEST - 2 VIEW COMPARISON:  July 29, 2022 FINDINGS: Right upper extremity PICC in place, with the tip projecting over the low SVC. Stable cardiomediastinal contours. Low lung volumes with bronchovascular crowding. Bibasilar atelectasis. Increased small bilateral pleural effusions. No large pneumothorax. Surgical clips overlying the right upper quadrant. No acute osseous abnormality. IMPRESSION: Increased small bilateral pleural effusions. Electronically Signed   By: Beryle Flock M.D.   On: 08/01/2022 10:30     Scheduled Meds:  acetaminophen  1,000 mg Oral TID   apixaban  5 mg Oral BID   Chlorhexidine Gluconate Cloth  6 each Topical Daily   furosemide  40 mg Oral Daily   insulin aspart  0-9 Units Subcutaneous TID WC   irbesartan  75 mg Oral Daily   lidocaine  2 patch Transdermal Q24H   metoprolol tartrate   50 mg Oral BID   pantoprazole  40 mg Oral BID   potassium chloride  40 mEq Oral BID   sodium chloride flush  10-40 mL Intracatheter Q12H   topiramate  100 mg Oral QHS   traZODone  100 mg Oral QHS   venlafaxine XR  75 mg Oral Daily   Continuous Infusions:   LOS: 9 days   Time spent: 25  Sara Sells, MD [facility] To contact the attending provider between 7A-7P or the covering provider during after hours 7P-7A, please log into the web site [email or web address] and access using universal [place] password for that web site. If you do not have the password, please call the hospital operator.  08/03/2022, 9:43  AM    

## 2022-08-03 NOTE — Progress Notes (Signed)
Patient transported down to xray via bed by transportation staff

## 2022-08-03 NOTE — Progress Notes (Deleted)
Patient encouraged to ambulate. Patient refused to work with PT

## 2022-08-03 NOTE — Progress Notes (Signed)
   08/03/22 1500  Mobility  Activity Ambulated with assistance in hallway  Level of [place] wheel walker  Distance Ambulated (ft) 260 ft  Activity Response Tolerated well  $Mobility charge 1 Mobility   Mobility Specialist Progress Note  Pt was found coming out of bathroom and agreeable. Had c/o back pain before & during ambulation. Was left in bed w/ all needs met & call bell in reach.   Lucious Groves Mobility Specialist

## 2022-08-04 ENCOUNTER — Other Ambulatory Visit (HOSPITAL_COMMUNITY): Payer: Self-pay

## 2022-08-04 DIAGNOSIS — K567 Ileus, unspecified: Secondary | ICD-10-CM | POA: Diagnosis not present

## 2022-08-04 LAB — COMPREHENSIVE METABOLIC PANEL
ALT: 15 U/L (ref 0–44)
AST: 18 U/L (ref 15–41)
Albumin: 2.6 g/dL — ABNORMAL LOW (ref 3.5–5.0)
Alkaline Phosphatase: 95 U/L (ref 38–126)
Anion gap: 9 (ref 5–15)
BUN: 20 mg/dL (ref 8–23)
CO2: 25 mmol/L (ref 22–32)
Calcium: 8.3 mg/dL — ABNORMAL LOW (ref 8.9–10.3)
Chloride: 105 mmol/L (ref 98–111)
Creatinine, Ser: 1.31 mg/dL — ABNORMAL HIGH (ref 0.44–1.00)
GFR, Estimated: 42 mL/min — ABNORMAL LOW (ref >60)
Glucose, Bld: 149 mg/dL — ABNORMAL HIGH (ref 70–99)
Potassium: 3.4 mmol/L — ABNORMAL LOW (ref 3.5–5.1)
Sodium: 139 mmol/L (ref 135–145)
Total Bilirubin: 0.5 mg/dL (ref 0.3–1.2)
Total Protein: 5.3 g/dL — ABNORMAL LOW (ref 6.5–8.1)

## 2022-08-04 LAB — CBC WITH DIFFERENTIAL/PLATELET
Abs Immature Granulocytes: 0.08 10*3/uL — ABNORMAL HIGH (ref 0.00–0.07)
Basophils Absolute: 0 10*3/uL (ref 0.0–0.1)
Basophils Relative: 0 %
Eosinophils Absolute: 0 10*3/uL (ref 0.0–0.5)
Eosinophils Relative: 0 %
HCT: 29.4 % — ABNORMAL LOW (ref 36.0–46.0)
Hemoglobin: 9.4 g/dL — ABNORMAL LOW (ref 12.0–15.0)
Immature Granulocytes: 1 %
Lymphocytes Relative: 17 %
Lymphs Abs: 1.8 10*3/uL (ref 0.7–4.0)
MCH: 29.3 pg (ref 26.0–34.0)
MCHC: 32 g/dL (ref 30.0–36.0)
MCV: 91.6 fL (ref 80.0–100.0)
Monocytes Absolute: 1 10*3/uL (ref 0.1–1.0)
Monocytes Relative: 10 %
Neutro Abs: 7.8 10*3/uL — ABNORMAL HIGH (ref 1.7–7.7)
Neutrophils Relative %: 72 %
Platelets: 291 10*3/uL (ref 150–400)
RBC: 3.21 MIL/uL — ABNORMAL LOW (ref 3.87–5.11)
RDW: 13.8 % (ref 11.5–15.5)
WBC: 10.7 10*3/uL — ABNORMAL HIGH (ref 4.0–10.5)
nRBC: 0 % (ref 0.0–0.2)

## 2022-08-04 LAB — GLUCOSE, CAPILLARY
Glucose-Capillary: 102 mg/dL — ABNORMAL HIGH (ref 70–99)
Glucose-Capillary: 152 mg/dL — ABNORMAL HIGH (ref 70–99)

## 2022-08-04 MED ORDER — PREDNISONE 10 MG PO TABS
30.0000 mg | ORAL_TABLET | Freq: Every day | ORAL | 0 refills | Status: DC
  Filled 2022-08-04: qty 3, 1d supply, fill #0

## 2022-08-04 MED ORDER — OXYCODONE HCL 5 MG PO TABS
5.0000 mg | ORAL_TABLET | Freq: Four times a day (QID) | ORAL | 0 refills | Status: DC | PRN
  Filled 2022-08-04: qty 15, 4d supply, fill #0

## 2022-08-04 MED ORDER — METOPROLOL TARTRATE 50 MG PO TABS
50.0000 mg | ORAL_TABLET | Freq: Two times a day (BID) | ORAL | 6 refills | Status: DC
  Filled 2022-08-04: qty 30, 15d supply, fill #0

## 2022-08-04 MED ORDER — ACETAMINOPHEN 500 MG PO TABS
1000.0000 mg | ORAL_TABLET | Freq: Three times a day (TID) | ORAL | 0 refills | Status: DC
  Filled 2022-08-04: qty 30, 5d supply, fill #0

## 2022-08-04 MED ORDER — FUROSEMIDE 40 MG PO TABS
40.0000 mg | ORAL_TABLET | Freq: Every day | ORAL | 2 refills | Status: DC
  Filled 2022-08-04: qty 30, 30d supply, fill #0

## 2022-08-04 MED ORDER — APIXABAN 5 MG PO TABS
5.0000 mg | ORAL_TABLET | Freq: Two times a day (BID) | ORAL | 11 refills | Status: DC
  Filled 2022-08-04: qty 60, 30d supply, fill #0

## 2022-08-04 NOTE — TOC Transition Note (Signed)
Transition of Care (TOC) - CM/SW Discharge Note Marvetta Gibbons RN, BSN Transitions of Care Unit 4E- RN Case Manager See Treatment Team for direct phone #  Cross coverage for 6N  Patient Details  Name: Sara Mcdonald MRN: [id number] Date of Birth: 16-Jul-1946  Transition of Care [facility]) CM/SW Contact:  Dawayne Patricia, RN Phone Number: 08/04/2022, 12:15 PM   Clinical Narrative:    Pt stable for transition home today, HHPT/OT services are to resume w/ [facility]- pt was active with them prior to admit.  CM has notified Amy w/ [facility] of transition home today for resumption of care.      Final next level of care: [place] Barriers to Discharge: Barriers Resolved   Patient Goals and CMS Choice Patient states their goals for this hospitalization and ongoing recovery are:: to return to home CMS Medicare.gov Compare Post Acute Care list provided to:: Patient Choice offered to / list presented to : Patient  Discharge Placement                 Home w/ [facility]      Discharge Plan and Services   Discharge Planning Services: CM Consult Post Acute Care Choice: Home Health          DME Arranged: N/A DME Agency: NA       HH Arranged: PT, OT HH Agency: [place] Date [place]: 07/25/22 Time [place]: 1111 Representative spoke with at [place]: [place] ([place]) Interventions     Readmission Risk Interventions    08/04/2022   12:14 PM  Readmission Risk Prevention Plan  Transportation Screening Complete  PCP or Specialist Appt within 3-5 Days Complete  HRI or [place] Complete  Social Work Consult for [place] Planning/Counseling Complete  Palliative Care Screening Not Applicable  Medication Review Press photographer) Complete

## 2022-08-04 NOTE — Progress Notes (Signed)
Mobility Specialist Progress Note:   08/04/22 1025  Mobility  Activity Ambulated with assistance in hallway  Level of Assistance Standby assist, set-up cues, supervision of patient - no hands on  Assistive Device Front wheel walker  Distance Ambulated (ft) 275 ft  Activity Response Tolerated well  $Mobility charge 1 Mobility   Pt eager for mobility despite significant back pain. Otherwise, pt asx throughout. Pt back in bed with all needs met, eager for d/c.  Nelta Numbers Acute Rehab Secure Chat or Office Phone: [phone]

## 2022-08-04 NOTE — Progress Notes (Signed)
This nurse discussed procedure with patient. HOB less than 45*. Held breath upon line removal. Pressure held for 5 min, without s/sx of bleeding. Instructed to remain in bed for 19mn and to report any s/sx of bleeding and apply pressure. Instructed to keep drsg on for 24 hours keeping it CDI. Notified nurse. Patient VU. HFran Lowes RN VAST

## 2022-08-04 NOTE — Progress Notes (Signed)
Sara Mcdonald to be D/C'd per MD order. Discussed with the patient and all questions fully answered. ? An After Visit Summary was printed and given to the patient. Patient informed prescriptions would be delivered from [facility]. ? D/C education completed with patient including follow up instructions, medication list, d/c activities limitations if indicated, with other d/c instructions as indicated by MD - patient able to verbalize understanding, all questions fully answered.  ? Patient instructed to return to ED, call 911, or call MD for any changes in condition.  ? Patient to be escorted via [place], and D/C home via private auto.

## 2022-08-04 NOTE — Discharge Summary (Signed)
Physician Discharge Summary  Sara Mcdonald:[id number] DOB: 12-15-1946 DOA: 07/24/2022  PCP: Sara Aus, MD  Admit date: 07/24/2022 Discharge date: 08/04/2022  Time spent: 36 minutes  Recommendations for Outpatient Follow-up:  Recommend Chem-12 and magnesium in about 1 week's time and replace electrolytes as needed Suggest titration of blood pressure meds as an outpatient depending on heart rate etc. Recommend outpatient review of pain needs-given limited prescription of oxycodone on discharge  Discharge Diagnoses:  MAIN problem for hospitalization   Abdominal pain with ischemic colitis New onset A-fib RVR New onset HF PEF Asthma exacerbation  Please see below for itemized issues addressed in HOpsital- refer to other progress notes for clarity if needed  Discharge Condition: Improved  Diet recommendation: Heart healthy  Filed Weights   08/02/22 1218 08/03/22 0500 08/04/22 0500  Weight: 83.8 kg 85.4 kg 84.9 kg    History of present illness:  76 year old white female known history DM TY 2 sleep apnea and recent L4-5 decompression surgery bipolar Admitted with abdominal pain GI consulted after CT scan showed ileus versus inflammatory colitis She subsequently developed A-fib with RVR and volume overload with chest x-ray showing pleural effusions  Hospital Course:  Abdominal pain secondary to ischemic colitis Status post colonoscopy 8/11 showing the same on pathology Polyp was found which was benign Also hemorrhoids were found-antibiotics were discontinued She is stabilizing and has been graduated to regular diet and was able to tolerate the same Patient will need outpatient follow-up as needed per GI A-fib RVR this admission with HFpEF EF 65-70% Patient started on Lopressor 50 twice daily-continues on Eliquis twice daily without any concerns for bleed Patient was diuresed 1.5 L aggressively with Lasix IV 40 daily  Patient was transition to Lasix 40 daily on  discharge and will need labs including magnesium in about 1 week she is aware of this She does not have an oxygen requirement Pleural effusion asthma exacerbation X-rays performed 8/14 8/15 are almost identical per my overview Patient was given prednisone 30 mg 3 times daily I will complete a 7-day course and reorder this Her wheezing has improved at time of discharge DM TY 2 Metformin 500 resumed 8/16 Glycemia is well controlled on sliding scale while hospitalized Recent L4-L5 decompression Seems stable at this time will review new wound prior to discharge Can use Flexeril as an outpatient 5 mg 3 times daily --we did refill her Percocet-Will need outpatient follow-up with [name] for refills HTN Holding Benicar 20 daily and Maxide 1 tablet daily for now can reassess as an outpatient   Discharge Exam: Vitals:   08/04/22 0507 08/04/22 0815  BP: (!) 141/77 (!) 122/51  Pulse: 90 93  Resp:  17  Temp: 98.1 F (36.7 C) 97.9 F (36.6 C)  SpO2: 98% 98%    Subj on day of d/c   Awake coherent no distress EOMI NCAT no focal deficit no icterus no pallor no rales rhonchi-  she is pleasantly oriented able to ambulate and has been doing well She does not report any dark stool tarry stool   Discharge Instructions   Discharge Instructions     Diet - low sodium heart healthy   Complete by: As directed    Discharge instructions   Complete by: As directed    Make sure that you look at your medications carefully you have been started on 2 new medications for your new heart rate issues called metoprolol which is a beta-blocker for heart rate control and Lasix which is  a fluid pill to help with the buildup of fluid from underlying rate related heart failure You will also need to be on a blood thinner lifelong please monitor your stools and if you see dark stools inform your physician We have given you a limited prescription of oxycodone for severe pain for your hip-please follow-up with  your orthopedic surgeon for refills You will notice that some your blood pressure medications have changed and have been discontinued to allow for the metoprolol to work and control your heart rate-please discuss this with your primary physician You will need labs in a week as discussed and probably a follow-up office visit in about 2 to 3 weeks in the outpatient setting   Increase activity slowly   Complete by: As directed    No wound care   Complete by: As directed       Allergies as of 08/04/2022       Reactions   Elemental Sulfur Rash   Sulfa Antibiotics Rash   Tricor [fenofibrate] Rash        Medication List     STOP taking these medications    oxyCODONE-acetaminophen 5-325 MG tablet Commonly known as: PERCOCET/ROXICET   triamterene-hydrochlorothiazide 37.5-25 MG tablet Commonly known as: MAXZIDE-25       TAKE these medications    acetaminophen 500 MG tablet Commonly known as: TYLENOL Take 2 tablets (1,000 mg total) by mouth 3 (three) times daily.   ALPRAZolam 0.5 MG tablet Commonly known as: XANAX Take 0.5 mg by mouth 2 (two) times daily as needed for anxiety or sleep.   apixaban 5 MG Tabs tablet Commonly known as: ELIQUIS Take 1 tablet (5 mg total) by mouth 2 (two) times daily.   cholecalciferol 25 MCG (1000 UNIT) tablet Commonly known as: VITAMIN D3 Take 1,000 Units by mouth daily.   cyclobenzaprine 5 MG tablet Commonly known as: FLEXERIL Take 1 tablet (5 mg total) by mouth 3 (three) times daily as needed for muscle spasms.   docusate sodium 100 MG capsule Commonly known as: COLACE Take 1 capsule (100 mg total) by mouth 2 (two) times daily.   furosemide 40 MG tablet Commonly known as: LASIX Take 1 tablet (40 mg total) by mouth daily. Start taking on: August 05, 2022   metFORMIN 500 MG tablet Commonly known as: GLUCOPHAGE Take 500 mg by mouth daily.   metoprolol tartrate 50 MG tablet Commonly known as: LOPRESSOR Take 1 tablet (50 mg total)  by mouth 2 (two) times daily.   multivitamin with minerals Tabs tablet Take 1 tablet by mouth daily.   nystatin-triamcinolone cream Commonly known as: MYCOLOG II Apply 1 application  topically daily as needed (rash under breast).   olmesartan 20 MG tablet Commonly known as: BENICAR Take 20 mg by mouth daily.   oxyCODONE 5 MG immediate release tablet Commonly known as: Oxy IR/ROXICODONE Take 1 tablet (5 mg total) by mouth every 6 (six) hours as needed for severe pain.   pantoprazole 40 MG tablet Commonly known as: PROTONIX Take 40 mg by mouth 2 (two) times daily.   polyethylene glycol powder 17 GM/SCOOP powder Commonly known as: GLYCOLAX/MIRALAX Take 17 g by mouth daily as needed for moderate constipation.   pravastatin 80 MG tablet Commonly known as: PRAVACHOL Take 80 mg by mouth daily.   predniSONE 10 MG tablet Commonly known as: DELTASONE Take 3 tablets (30 mg total) by mouth daily with breakfast. Start taking on: August 05, 2022   promethazine 25 MG tablet Commonly known  as: PHENERGAN Take 25 mg by mouth every 6 (six) hours as needed for nausea or vomiting.   sucralfate 1 g tablet Commonly known as: CARAFATE Take 1 g by mouth 2 (two) times daily.   topiramate 100 MG tablet Commonly known as: TOPAMAX Take 100 mg by mouth at bedtime.   traZODone 100 MG tablet Commonly known as: DESYREL Take 100 mg by mouth at bedtime.   venlafaxine XR 75 MG 24 hr capsule Commonly known as: EFFEXOR-XR Take 75 mg by mouth daily.       Allergies  Allergen Reactions   Elemental Sulfur Rash   Sulfa Antibiotics Rash   Tricor [Fenofibrate] Rash    Follow-up [place]. Follow up.   Why: [facility] Contact information: [place] [address] [phone]                  The results of significant diagnostics from this hospitalization (including imaging, microbiology, ancillary and laboratory) are listed  below for reference.    Significant Diagnostic Studies: DG Chest 2 View  Result Date: 08/03/2022 CLINICAL DATA:  Evaluate for pleural effusion. EXAM: CHEST - 2 VIEW COMPARISON:  08/01/2022 FINDINGS: There is a right arm PICC line with tip in the projection of the distal SVC. Stable cardiomediastinal contours. Small bilateral pleural effusions are again noted and appear unchanged from the previous exam. Scar versus atelectasis within the right base is unchanged. No new findings. IMPRESSION: 1. Stable small bilateral pleural effusions. 2. Right base scarring versus atelectasis. Electronically Signed   By: Kerby Moors M.D.   On: 08/03/2022 08:27   DG Chest 2 View  Result Date: 08/01/2022 CLINICAL DATA:  Dyspnea EXAM: CHEST - 2 VIEW COMPARISON:  July 29, 2022 FINDINGS: Right upper extremity PICC in place, with the tip projecting over the low SVC. Stable cardiomediastinal contours. Low lung volumes with bronchovascular crowding. Bibasilar atelectasis. Increased small bilateral pleural effusions. No large pneumothorax. Surgical clips overlying the right upper quadrant. No acute osseous abnormality. IMPRESSION: Increased small bilateral pleural effusions. Electronically Signed   By: Beryle Flock M.D.   On: 08/01/2022 10:30   ECHOCARDIOGRAM COMPLETE  Result Date: 07/30/2022    ECHOCARDIOGRAM REPORT   Patient Name:   Sara Mcdonald Dayley Date of Exam: 07/30/2022 Medical Rec #:  [id number]        Height:       59.0 in Accession #:    [id number]       Weight:       195.0 lb Date of Birth:  1946-05-26        BSA:          1.825 m Patient Age:    76 years         BP:           163/90 mmHg Patient Gender: F                HR:           78 bpm. Exam Location:  Inpatient Procedure: 2D Echo, Cardiac Doppler and Color Doppler Indications:    Pulmonary embolism  History:        Patient has prior history of Echocardiogram examinations, most                 recent 02/03/2017. Signs/Symptoms:Shortness of Breath.   Sonographer:    Merrie Roof RDCS Referring Phys: [id number] [place]  1. Left  ventricular ejection fraction, by estimation, is 65 to 70%. The left ventricle has normal function. The left ventricle has no regional wall motion abnormalities. Left ventricular diastolic parameters are consistent with Grade I diastolic dysfunction (impaired relaxation).  2. Right ventricular systolic function is normal. The right ventricular size is normal. Tricuspid regurgitation signal is inadequate for assessing PA pressure.  3. The mitral valve is grossly normal. No evidence of mitral valve regurgitation. No evidence of mitral stenosis.  4. The aortic valve is tricuspid. Aortic valve regurgitation is not visualized. No aortic stenosis is present.  5. The inferior vena cava is normal in size with greater than 50% respiratory variability, suggesting right atrial pressure of 3 mmHg. Conclusion(s)/Recommendation(s): Normal biventricular function without evidence of hemodynamically significant valvular heart disease. FINDINGS  Left Ventricle: Left ventricular ejection fraction, by estimation, is 65 to 70%. The left ventricle has normal function. The left ventricle has no regional wall motion abnormalities. The left ventricular internal cavity size was normal in size. There is  no left ventricular hypertrophy. Left ventricular diastolic parameters are consistent with Grade I diastolic dysfunction (impaired relaxation). Right Ventricle: The right ventricular size is normal. No increase in right ventricular wall thickness. Right ventricular systolic function is normal. Tricuspid regurgitation signal is inadequate for assessing PA pressure. Left Atrium: Left atrial size was normal in size. Right Atrium: Right atrial size was normal in size. Pericardium: There is no evidence of pericardial effusion. Presence of epicardial fat layer. Mitral Valve: The mitral valve is grossly normal. No evidence of mitral valve regurgitation.  No evidence of mitral valve stenosis. Tricuspid Valve: The tricuspid valve is grossly normal. Tricuspid valve regurgitation is trivial. No evidence of tricuspid stenosis. Aortic Valve: The aortic valve is tricuspid. Aortic valve regurgitation is not visualized. No aortic stenosis is present. Pulmonic Valve: The pulmonic valve was grossly normal. Pulmonic valve regurgitation is trivial. No evidence of pulmonic stenosis. Aorta: The aortic root and ascending aorta are structurally normal, with no evidence of dilitation. Venous: The inferior vena cava is normal in size with greater than 50% respiratory variability, suggesting right atrial pressure of 3 mmHg. IAS/Shunts: The atrial septum is grossly normal.  LEFT VENTRICLE PLAX 2D LVIDd:         4.40 cm   Diastology LVIDs:         2.90 cm   LV e' medial:    5.66 cm/s LV PW:         1.00 cm   LV E/e' medial:  17.2 LV IVS:        1.10 cm   LV e' lateral:   6.96 cm/s LVOT diam:     1.80 cm   LV E/e' lateral: 14.0 LV SV:         64 LV SV Index:   35 LVOT Area:     2.54 cm  RIGHT VENTRICLE RV Basal diam:  3.30 cm RV S prime:     12.80 cm/s TAPSE (M-mode): 1.8 cm LEFT ATRIUM             Index        RIGHT ATRIUM           Index LA diam:        4.30 cm 2.36 cm/m   RA Area:     10.90 cm LA Vol (A2C):   37.8 ml 20.72 ml/m  RA Volume:   20.50 ml  11.24 ml/m LA Vol (A4C):   53.0 ml 29.05 ml/m LA Biplane Vol:  46.1 ml 25.27 ml/m  AORTIC VALVE LVOT Vmax:   132.00 cm/s LVOT Vmean:  89.900 cm/s LVOT VTI:    0.252 m  AORTA Ao Root diam: 2.70 cm Ao Asc diam:  3.40 cm Ao Desc diam: 2.20 cm MITRAL VALVE MV Area (PHT): 3.65 cm     SHUNTS MV Decel Time: 208 msec     Systemic VTI:  0.25 m MV E velocity: 97.10 cm/s   Systemic Diam: 1.80 cm MV A velocity: 122.00 cm/s MV E/A ratio:  0.80 Eleonore Chiquito MD Electronically signed by Eleonore Chiquito MD Signature Date/Time: 07/30/2022/1:31:54 PM    Final    VAS [place] LOWER EXTREMITY VENOUS (DVT)  Result Date: 07/30/2022  Lower Venous DVT Study  Patient Name:  AMILAH GREENSPAN Swoboda  Date of Exam:   07/29/2022 Medical Rec #: [id number]         Accession #:    [id number] Date of Birth: 10/21/1946         Patient Gender: F Patient Age:   59 years Exam Location:  [facility] Procedure:      VAS [place] LOWER EXTREMITY VENOUS (DVT) Referring Phys: ERIC [place] --------------------------------------------------------------------------------  Indications: Swelling.  Comparison Study: No previous exam noted. Performing Technologist: Bobetta Lime BS, RVT  Examination Guidelines: A complete evaluation includes B-mode imaging, spectral Doppler, color Doppler, and power Doppler as needed of all accessible portions of each vessel. Bilateral testing is considered an integral part of a complete examination. Limited examinations for reoccurring indications may be performed as noted. The reflux portion of the exam is performed with the patient in reverse Trendelenburg.  +---------+---------------+---------+-----------+----------+--------------+ RIGHT    CompressibilityPhasicitySpontaneityPropertiesThrombus Aging +---------+---------------+---------+-----------+----------+--------------+ CFV      Full           Yes      Yes                                 +---------+---------------+---------+-----------+----------+--------------+ SFJ      Full                                                        +---------+---------------+---------+-----------+----------+--------------+ FV Prox  Full                                                        +---------+---------------+---------+-----------+----------+--------------+ FV Mid   Full                                                        +---------+---------------+---------+-----------+----------+--------------+ FV DistalFull                                                        +---------+---------------+---------+-----------+----------+--------------+ PFV      Full                                                         +---------+---------------+---------+-----------+----------+--------------+  POP      Full           Yes      Yes                                 +---------+---------------+---------+-----------+----------+--------------+ PTV      Full                                                        +---------+---------------+---------+-----------+----------+--------------+ PERO     Full                                                        +---------+---------------+---------+-----------+----------+--------------+   +---------+---------------+---------+-----------+----------+--------------+ LEFT     CompressibilityPhasicitySpontaneityPropertiesThrombus Aging +---------+---------------+---------+-----------+----------+--------------+ CFV      Full           Yes      Yes                                 +---------+---------------+---------+-----------+----------+--------------+ SFJ      Full                                                        +---------+---------------+---------+-----------+----------+--------------+ FV Prox  Full                                                        +---------+---------------+---------+-----------+----------+--------------+ FV Mid   Full                                                        +---------+---------------+---------+-----------+----------+--------------+ FV DistalFull                                                        +---------+---------------+---------+-----------+----------+--------------+ PFV      Full                                                        +---------+---------------+---------+-----------+----------+--------------+ POP      Full           Yes      Yes                                 +---------+---------------+---------+-----------+----------+--------------+  PTV      Full                                                         +---------+---------------+---------+-----------+----------+--------------+ PERO     Full                                                        +---------+---------------+---------+-----------+----------+--------------+     Summary: BILATERAL: - No evidence of deep vein thrombosis seen in the lower extremities, bilaterally. -No evidence of popliteal cyst, bilaterally.   *See table(s) above for measurements and observations. Electronically signed by Orlie Pollen on 07/30/2022 at 12:31:27 PM.    Final    [place] EKG SITE RITE  Result Date: 07/29/2022 If Site Rite image not attached, placement could not be confirmed due to current cardiac rhythm.  DG CHEST PORT 1 VIEW  Result Date: 07/29/2022 CLINICAL DATA:  Shortness of breath with wheezing EXAM: PORTABLE CHEST 1 VIEW COMPARISON:  Chest CT from earlier today FINDINGS: Cardiomegaly and vascular pedicle widening. Bilateral pleural effusion and atelectasis. No pneumothorax. IMPRESSION: 1. Stable from chest CTA earlier today. 2. Atelectasis and pleural effusions. Electronically Signed   By: Jorje Guild M.D.   On: 07/29/2022 06:14   CT Angio Chest Pulmonary Embolism (PE) W or WO Contrast  Result Date: 07/29/2022 CLINICAL DATA:  Pulmonary embolism (PE) suspected, positive D-dimer EXAM: CT ANGIOGRAPHY CHEST WITH CONTRAST TECHNIQUE: Multidetector CT imaging of the chest was performed using the standard protocol during bolus administration of intravenous contrast. Multiplanar CT image reconstructions and MIPs were obtained to evaluate the vascular anatomy. RADIATION DOSE REDUCTION: This exam was performed according to the departmental dose-optimization program which includes automated exposure control, adjustment of the mA and/or kV according to patient size and/or use of iterative reconstruction technique. CONTRAST:  50m OMNIPAQUE IOHEXOL 350 MG/ML SOLN COMPARISON:  No recent chest imaging.  Chest CT 02/01/2017 reviewed FINDINGS: Cardiovascular: There are  no filling defects within the pulmonary arteries to suggest pulmonary embolus. Tortuosity of the thoracic aorta, no aneurysm or acute findings. Heart is normal in size. No pericardial effusion. There is contrast refluxing into the hepatic veins and IVC. Mild lipomatous hypertrophy of the intra-atrial septum. Mediastinum/Nodes: Prominent 9 mm right hilar lymph node. Scattered small mediastinal lymph nodes, not enlarged by size criteria. The esophagus is decompressed. No thyroid nodule. Lungs/Pleura: Small to moderate right and small left pleural effusion with adjacent compressive atelectasis. Mild heterogeneous pulmonary parenchyma. No pneumothorax. No pulmonary mass or visualized nodule. Unremarkable appearance of the trachea and central bronchi. Upper Abdomen: Contrast refluxes into the hepatic veins and IVC. Trace perihepatic ascites. Musculoskeletal: Subcutaneous edema in the right flank. There are no acute or suspicious osseous abnormalities. Slight scoliotic curvature. Review of the MIP images confirms the above findings. IMPRESSION: 1. No pulmonary embolus. 2. Small to moderate right and small left pleural effusions with adjacent compressive atelectasis. 3. Contrast refluxing into the hepatic veins and IVC, consistent with elevated right heart pressures. 4. Mild heterogeneous pulmonary parenchyma, can be seen with small airways disease. 5. Trace perihepatic ascites in the upper abdomen. Electronically Signed   By: MAurther LoftD.  On: 07/29/2022 03:18   DG Abd 1 View  Result Date: 07/28/2022 CLINICAL DATA:  Concern for ileus. EXAM: ABDOMEN - 1 VIEW COMPARISON:  07/27/2022 FINDINGS: There is persistent dilatation of large bowel loops. Dilatation of the transverse and ascending colon are slightly improved. There is a persistent loop of large bowel in the LEFT central abdomen, likely LOWER descending colon which appears more distended compared to prior studies. Question of wall thickening and loss of  calcium markings of this loop. IMPRESSION: Persistent but slightly improved dilatation of large bowel loops. Question thickened wall of the LOWER descending colon loop in the LEFT central abdomen. Consider follow-up CT exam if needed. Electronically Signed   By: Nolon Nations M.D.   On: 07/28/2022 08:23   DG Abd 1 View  Result Date: 07/27/2022 CLINICAL DATA:  Abdominal pain. EXAM: ABDOMEN - 1 VIEW COMPARISON:  Abdominal x-ray from yesterday. FINDINGS: Diffuse colonic distention has mildly improved since yesterday. There is a suggestion of increased wall thickening involving the redundant sigmoid colon. No small bowel dilatation. No acute osseous abnormality. IMPRESSION: 1. Improving colonic ileus. 2. Possible increased wall thickening involving the redundant sigmoid colon, which could reflect colitis. Electronically Signed   By: Titus Dubin M.D.   On: 07/27/2022 11:59   DG Abd 1 View  Result Date: 07/26/2022 CLINICAL DATA:  Ileus. EXAM: ABDOMEN - 1 VIEW COMPARISON:  07/24/2022 and CT 07/24/2022. FINDINGS: There continues to be gas-filled loops of throughout the abdomen. Most dilated loops of bowel continue to be the colon. Postsurgical changes in lower lumbar spine. Residual high-density material in the urinary bladder compatible with previous contrast administration. Limited evaluation of the lung bases. Difficult to exclude basilar chest disease. Limited evaluation for free air on the supine images. IMPRESSION: Persistent gas-filled loops of bowel throughout the abdomen and pelvis with distension of the colon. Findings are similar to the recent comparison examinations and could represent an ileus. Electronically Signed   By: Markus Daft M.D.   On: 07/26/2022 07:33   DG Pelvis Portable  Result Date: 07/24/2022 CLINICAL DATA:  Abdominal distension.  Recent lumbar decompression. EXAM: PORTABLE PELVIS 1-2 VIEWS COMPARISON:  None Available. FINDINGS: Postoperative changes in the lower lumbar spine.  Mildly prominent bowel loops are partially visualized in the lower abdomen and upper pelvis. Contrast material seen within the renal collecting systems and bladder from earlier CT. No acute bony abnormality. IMPRESSION: Partially imaged dilated bowel loops in the lower abdomen. CT earlier abdominal CT for further discussion. Changes of lower lumbar fusion. No visible hardware complicating feature on this single AP image. Electronically Signed   By: Rolm Baptise M.D.   On: 07/24/2022 22:34   CT ABDOMEN PELVIS W CONTRAST  Result Date: 07/24/2022 CLINICAL DATA:  Recent back surgery 07/18/2022, constipation EXAM: CT ABDOMEN AND PELVIS WITH CONTRAST TECHNIQUE: Multidetector CT imaging of the abdomen and pelvis was performed using the standard protocol following bolus administration of intravenous contrast. RADIATION DOSE REDUCTION: This exam was performed according to the departmental dose-optimization program which includes automated exposure control, adjustment of the mA and/or kV according to patient size and/or use of iterative reconstruction technique. CONTRAST:  31m OMNIPAQUE IOHEXOL 300 MG/ML  SOLN COMPARISON:  01/26/2021 FINDINGS: Lower chest: Bandlike dependent bibasilar scarring or atelectasis. Hepatobiliary: No focal liver abnormality is seen. Status post cholecystectomy. No biliary dilatation. Pancreas: Unremarkable. No pancreatic ductal dilatation or surrounding inflammatory changes. Spleen: Normal in size without significant abnormality. Adrenals/Urinary Tract: Adrenal glands are unremarkable. Kidneys are normal, without  renal calculi, solid lesion, or hydronephrosis. Bladder is unremarkable. Stomach/Bowel: Stomach is within normal limits. Appendix not clearly visualized and may be surgically absent. Small bowel is nondistended. The colon is diffusely stool, fluid, and air-filled, mildly distended, largest loops of cecum measuring up to 8.1 cm. Mild adjacent inflammatory fat stranding and trace fluid in  the paracolic gutters. Vascular/Lymphatic: Aortic atherosclerosis. No enlarged abdominal or pelvic lymph nodes. Reproductive: Status post hysterectomy. Other: No abdominal wall hernia.  Anasarca. Musculoskeletal: Status post posterior laminectomy and interbody fusion of L4-L5 with an overlying air and fluid collection measuring approximately 5.3 x 2.9 cm (series 3, image 47). IMPRESSION: 1. The colon is diffusely stool, fluid, and air-filled, mildly distended, largest loops of cecum measuring up to 8.1 cm. Gas and stool present to the rectum. Mild adjacent inflammatory fat stranding and trace fluid in the paracolic gutters. Small bowel is nondistended. Findings suggest postoperative colonic ileus or alternately nonspecific infectious or inflammatory colitis, potentially including C diff colitis. 2. Status post posterior laminectomy and interbody fusion of L4-L5 with an overlying air and fluid collection in the subcutaneous soft tissue measuring approximately 5.3 x 2.9 cm. This most likely reflects postoperative hematoma or seroma in the recent postoperative setting, however the presence or absence of infection in this fluid is not established by CT. Aortic Atherosclerosis (ICD10-I70.0). Electronically Signed   By: Delanna Ahmadi M.D.   On: 07/24/2022 18:22   DG Lumbar Spine 2-3 Views  Result Date: 07/18/2022 CLINICAL DATA:  L4-L5 PLIF EXAM: LUMBAR SPINE - 2-3 VIEW COMPARISON:  Radiograph 07/18/2022 FINDINGS: Intraoperative images during L4-L5 fusion. No immediate complication. IMPRESSION: Intraoperative images during L4-L5 fusion. Electronically Signed   By: Maurine Simmering M.D.   On: 07/18/2022 15:49   DG C-Arm 1-60 Min-No Report  Result Date: 07/18/2022 Fluoroscopy was utilized by the requesting physician.  No radiographic interpretation.   DG Lumbar Spine 1 View  Result Date: 07/18/2022 CLINICAL DATA:  Intraoperative imaging for localization EXAM: LUMBAR SPINE - 1 VIEW COMPARISON:  MRI 05/30/2022 FINDINGS:  Single lateral view labeled 1320 hours demonstrates soft tissue expanders and a localizer projecting posterior to the L4-5 interspace. Loss of intervertebral disc height at the lumbosacral junction. Facet arthropathy at L5-S1 and L4-5. IMPRESSION: Intraoperative localization of L4-5. Electronically Signed   By: Abigail Miyamoto M.D.   On: 07/18/2022 14:31    Microbiology: No results found for this or any previous visit (from the past 240 hour(s)).   Labs: Basic Metabolic Panel: Recent Labs  Lab 07/29/22 0105 07/29/22 1158 07/31/22 0544 08/01/22 0211 08/02/22 0307 08/03/22 0424 08/04/22 0116  NA 134*   < > 140 138 137 139 139  K 2.9*   < > 3.0* 3.7 3.3* 3.1* 3.4*  CL 102   < > 103 103 106 107 105  CO2 24   < > '28 26 25 25 25  '$ GLUCOSE 149*   < > 121* 135* 170* 136* 149*  BUN 7*   < > '8 10 15 19 20  '$ CREATININE 1.02*   < > 1.19* 1.28* 1.21* 1.37* 1.31*  CALCIUM 8.0*   < > 8.0* 7.9* 8.2* 8.4* 8.3*  MG 1.7  --  1.9  --   --  1.8  --    < > = values in this interval not displayed.   Liver Function Tests: Recent Labs  Lab 08/04/22 0116  AST 18  ALT 15  ALKPHOS 95  BILITOT 0.5  PROT 5.3*  ALBUMIN 2.6*   No  results for input(s): "LIPASE", "AMYLASE" in the last 168 hours. No results for input(s): "AMMONIA" in the last 168 hours. CBC: Recent Labs  Lab 07/31/22 0408 08/01/22 0211 08/02/22 0307 08/03/22 0424 08/04/22 0116  WBC 7.8 7.6 7.4 8.6 10.7*  NEUTROABS  --   --   --   --  7.8*  HGB 10.6* 9.7* 9.2* 9.5* 9.4*  HCT 32.6* 31.8* 29.5* 31.5* 29.4*  MCV 90.3 94.9 94.2 96.0 91.6  PLT 279 270 261 286 291   Cardiac Enzymes: No results for input(s): "CKTOTAL", "CKMB", "CKMBINDEX", "TROPONINI" in the last 168 hours. BNP: BNP (last 3 results) Recent Labs    07/29/22 0105  BNP 179.3*    ProBNP (last 3 results) No results for input(s): "PROBNP" in the last 8760 hours.  CBG: Recent Labs  Lab 08/03/22 0848 08/03/22 1153 08/03/22 1702 08/03/22 2102 08/04/22 0816  GLUCAP  96 173* 182* 156* 102*       Signed:  Nita Sells MD   [facility] 08/04/2022, 10:04 AM

## 2022-08-05 ENCOUNTER — Other Ambulatory Visit (HOSPITAL_COMMUNITY): Payer: Self-pay

## 2022-08-10 DIAGNOSIS — M5136 Other intervertebral disc degeneration, lumbar region: Secondary | ICD-10-CM | POA: Diagnosis not present

## 2022-08-10 DIAGNOSIS — K9189 Other postprocedural complications and disorders of digestive system: Secondary | ICD-10-CM | POA: Diagnosis not present

## 2022-08-10 DIAGNOSIS — K567 Ileus, unspecified: Secondary | ICD-10-CM | POA: Diagnosis not present

## 2022-08-10 DIAGNOSIS — I482 Chronic atrial fibrillation, unspecified: Secondary | ICD-10-CM | POA: Diagnosis not present

## 2022-08-10 DIAGNOSIS — N1832 Chronic kidney disease, stage 3b: Secondary | ICD-10-CM | POA: Diagnosis not present

## 2022-08-18 ENCOUNTER — Other Ambulatory Visit (HOSPITAL_COMMUNITY): Payer: Self-pay

## 2022-08-19 DIAGNOSIS — I482 Chronic atrial fibrillation, unspecified: Secondary | ICD-10-CM | POA: Diagnosis not present

## 2022-08-19 DIAGNOSIS — I509 Heart failure, unspecified: Secondary | ICD-10-CM | POA: Diagnosis not present

## 2022-08-19 DIAGNOSIS — K9189 Other postprocedural complications and disorders of digestive system: Secondary | ICD-10-CM | POA: Diagnosis not present

## 2022-08-19 DIAGNOSIS — K567 Ileus, unspecified: Secondary | ICD-10-CM | POA: Diagnosis not present

## 2022-08-19 DIAGNOSIS — K5900 Constipation, unspecified: Secondary | ICD-10-CM | POA: Diagnosis not present

## 2022-08-24 DIAGNOSIS — G4730 Sleep apnea, unspecified: Secondary | ICD-10-CM | POA: Diagnosis not present

## 2022-08-24 DIAGNOSIS — Z981 Arthrodesis status: Secondary | ICD-10-CM | POA: Diagnosis not present

## 2022-08-24 DIAGNOSIS — M1990 Unspecified osteoarthritis, unspecified site: Secondary | ICD-10-CM | POA: Diagnosis not present

## 2022-08-24 DIAGNOSIS — Z4789 Encounter for other orthopedic aftercare: Secondary | ICD-10-CM | POA: Diagnosis not present

## 2022-08-24 DIAGNOSIS — Z7984 Long term (current) use of oral hypoglycemic drugs: Secondary | ICD-10-CM | POA: Diagnosis not present

## 2022-08-24 DIAGNOSIS — F32A Depression, unspecified: Secondary | ICD-10-CM | POA: Diagnosis not present

## 2022-08-24 DIAGNOSIS — E1122 Type 2 diabetes mellitus with diabetic chronic kidney disease: Secondary | ICD-10-CM | POA: Diagnosis not present

## 2022-08-24 DIAGNOSIS — M797 Fibromyalgia: Secondary | ICD-10-CM | POA: Diagnosis not present

## 2022-08-24 DIAGNOSIS — I129 Hypertensive chronic kidney disease with stage 1 through stage 4 chronic kidney disease, or unspecified chronic kidney disease: Secondary | ICD-10-CM | POA: Diagnosis not present

## 2022-08-24 DIAGNOSIS — N183 Chronic kidney disease, stage 3 unspecified: Secondary | ICD-10-CM | POA: Diagnosis not present

## 2022-09-02 DIAGNOSIS — M48062 Spinal stenosis, lumbar region with neurogenic claudication: Secondary | ICD-10-CM | POA: Diagnosis not present

## 2022-09-09 DIAGNOSIS — N1832 Chronic kidney disease, stage 3b: Secondary | ICD-10-CM | POA: Diagnosis not present

## 2022-09-09 DIAGNOSIS — Z23 Encounter for immunization: Secondary | ICD-10-CM | POA: Diagnosis not present

## 2022-09-09 DIAGNOSIS — I482 Chronic atrial fibrillation, unspecified: Secondary | ICD-10-CM | POA: Diagnosis not present

## 2022-10-04 DIAGNOSIS — N1832 Chronic kidney disease, stage 3b: Secondary | ICD-10-CM | POA: Diagnosis not present

## 2022-10-04 DIAGNOSIS — G4733 Obstructive sleep apnea (adult) (pediatric): Secondary | ICD-10-CM | POA: Diagnosis not present

## 2022-10-04 DIAGNOSIS — I10 Essential (primary) hypertension: Secondary | ICD-10-CM | POA: Diagnosis not present

## 2022-10-04 DIAGNOSIS — I480 Paroxysmal atrial fibrillation: Secondary | ICD-10-CM | POA: Diagnosis not present

## 2022-10-04 DIAGNOSIS — E1169 Type 2 diabetes mellitus with other specified complication: Secondary | ICD-10-CM | POA: Diagnosis not present

## 2022-10-04 DIAGNOSIS — I482 Chronic atrial fibrillation, unspecified: Secondary | ICD-10-CM | POA: Diagnosis not present

## 2022-10-04 DIAGNOSIS — E782 Mixed hyperlipidemia: Secondary | ICD-10-CM | POA: Diagnosis not present

## 2022-10-07 DIAGNOSIS — I480 Paroxysmal atrial fibrillation: Secondary | ICD-10-CM | POA: Diagnosis not present

## 2022-10-07 DIAGNOSIS — R0781 Pleurodynia: Secondary | ICD-10-CM | POA: Diagnosis not present

## 2022-11-07 DIAGNOSIS — I480 Paroxysmal atrial fibrillation: Secondary | ICD-10-CM | POA: Diagnosis not present

## 2022-11-09 DIAGNOSIS — I480 Paroxysmal atrial fibrillation: Secondary | ICD-10-CM | POA: Diagnosis not present

## 2022-11-09 DIAGNOSIS — E782 Mixed hyperlipidemia: Secondary | ICD-10-CM | POA: Diagnosis not present

## 2022-11-09 DIAGNOSIS — I10 Essential (primary) hypertension: Secondary | ICD-10-CM | POA: Diagnosis not present

## 2022-11-09 DIAGNOSIS — E1169 Type 2 diabetes mellitus with other specified complication: Secondary | ICD-10-CM | POA: Diagnosis not present

## 2022-11-09 DIAGNOSIS — G4733 Obstructive sleep apnea (adult) (pediatric): Secondary | ICD-10-CM | POA: Diagnosis not present

## 2022-11-22 DIAGNOSIS — E782 Mixed hyperlipidemia: Secondary | ICD-10-CM | POA: Diagnosis not present

## 2022-11-22 DIAGNOSIS — E1169 Type 2 diabetes mellitus with other specified complication: Secondary | ICD-10-CM | POA: Diagnosis not present

## 2022-11-24 DIAGNOSIS — G4733 Obstructive sleep apnea (adult) (pediatric): Secondary | ICD-10-CM | POA: Diagnosis not present

## 2022-11-28 DIAGNOSIS — G4733 Obstructive sleep apnea (adult) (pediatric): Secondary | ICD-10-CM | POA: Diagnosis not present

## 2022-11-28 DIAGNOSIS — E782 Mixed hyperlipidemia: Secondary | ICD-10-CM | POA: Diagnosis not present

## 2022-11-28 DIAGNOSIS — I480 Paroxysmal atrial fibrillation: Secondary | ICD-10-CM | POA: Diagnosis not present

## 2022-11-28 DIAGNOSIS — E538 Deficiency of other specified B group vitamins: Secondary | ICD-10-CM | POA: Diagnosis not present

## 2022-11-28 DIAGNOSIS — E1169 Type 2 diabetes mellitus with other specified complication: Secondary | ICD-10-CM | POA: Diagnosis not present

## 2022-11-28 DIAGNOSIS — D500 Iron deficiency anemia secondary to blood loss (chronic): Secondary | ICD-10-CM | POA: Diagnosis not present

## 2022-12-23 DIAGNOSIS — I4891 Unspecified atrial fibrillation: Secondary | ICD-10-CM | POA: Diagnosis not present

## 2022-12-23 DIAGNOSIS — K589 Irritable bowel syndrome without diarrhea: Secondary | ICD-10-CM | POA: Diagnosis not present

## 2022-12-23 DIAGNOSIS — Z7951 Long term (current) use of inhaled steroids: Secondary | ICD-10-CM | POA: Diagnosis not present

## 2022-12-23 DIAGNOSIS — M797 Fibromyalgia: Secondary | ICD-10-CM | POA: Diagnosis not present

## 2022-12-23 DIAGNOSIS — J4540 Moderate persistent asthma, uncomplicated: Secondary | ICD-10-CM | POA: Diagnosis not present

## 2022-12-23 DIAGNOSIS — D649 Anemia, unspecified: Secondary | ICD-10-CM | POA: Diagnosis not present

## 2022-12-23 DIAGNOSIS — I110 Hypertensive heart disease with heart failure: Secondary | ICD-10-CM | POA: Diagnosis not present

## 2022-12-23 DIAGNOSIS — Z7984 Long term (current) use of oral hypoglycemic drugs: Secondary | ICD-10-CM | POA: Diagnosis not present

## 2022-12-23 DIAGNOSIS — G4733 Obstructive sleep apnea (adult) (pediatric): Secondary | ICD-10-CM | POA: Diagnosis not present

## 2022-12-23 DIAGNOSIS — E785 Hyperlipidemia, unspecified: Secondary | ICD-10-CM | POA: Diagnosis not present

## 2022-12-23 DIAGNOSIS — Z9181 History of falling: Secondary | ICD-10-CM | POA: Diagnosis not present

## 2022-12-23 DIAGNOSIS — Z7901 Long term (current) use of anticoagulants: Secondary | ICD-10-CM | POA: Diagnosis not present

## 2022-12-23 DIAGNOSIS — G43909 Migraine, unspecified, not intractable, without status migrainosus: Secondary | ICD-10-CM | POA: Diagnosis not present

## 2022-12-23 DIAGNOSIS — K219 Gastro-esophageal reflux disease without esophagitis: Secondary | ICD-10-CM | POA: Diagnosis not present

## 2022-12-23 DIAGNOSIS — I509 Heart failure, unspecified: Secondary | ICD-10-CM | POA: Diagnosis not present

## 2022-12-30 DIAGNOSIS — I4891 Unspecified atrial fibrillation: Secondary | ICD-10-CM | POA: Diagnosis not present

## 2022-12-30 DIAGNOSIS — J4540 Moderate persistent asthma, uncomplicated: Secondary | ICD-10-CM | POA: Diagnosis not present

## 2022-12-30 DIAGNOSIS — I110 Hypertensive heart disease with heart failure: Secondary | ICD-10-CM | POA: Diagnosis not present

## 2022-12-30 DIAGNOSIS — M797 Fibromyalgia: Secondary | ICD-10-CM | POA: Diagnosis not present

## 2022-12-30 DIAGNOSIS — I509 Heart failure, unspecified: Secondary | ICD-10-CM | POA: Diagnosis not present

## 2022-12-30 DIAGNOSIS — G43909 Migraine, unspecified, not intractable, without status migrainosus: Secondary | ICD-10-CM | POA: Diagnosis not present

## 2022-12-30 DIAGNOSIS — G4733 Obstructive sleep apnea (adult) (pediatric): Secondary | ICD-10-CM | POA: Diagnosis not present

## 2023-01-06 DIAGNOSIS — D500 Iron deficiency anemia secondary to blood loss (chronic): Secondary | ICD-10-CM | POA: Diagnosis not present

## 2023-01-06 DIAGNOSIS — E538 Deficiency of other specified B group vitamins: Secondary | ICD-10-CM | POA: Diagnosis not present

## 2023-01-10 DIAGNOSIS — M4316 Spondylolisthesis, lumbar region: Secondary | ICD-10-CM | POA: Diagnosis not present

## 2023-01-10 DIAGNOSIS — M48062 Spinal stenosis, lumbar region with neurogenic claudication: Secondary | ICD-10-CM | POA: Diagnosis not present

## 2023-01-17 DIAGNOSIS — Z7984 Long term (current) use of oral hypoglycemic drugs: Secondary | ICD-10-CM | POA: Diagnosis not present

## 2023-01-17 DIAGNOSIS — Z9181 History of falling: Secondary | ICD-10-CM | POA: Diagnosis not present

## 2023-01-17 DIAGNOSIS — G4733 Obstructive sleep apnea (adult) (pediatric): Secondary | ICD-10-CM | POA: Diagnosis not present

## 2023-01-17 DIAGNOSIS — D649 Anemia, unspecified: Secondary | ICD-10-CM | POA: Diagnosis not present

## 2023-01-17 DIAGNOSIS — M797 Fibromyalgia: Secondary | ICD-10-CM | POA: Diagnosis not present

## 2023-01-17 DIAGNOSIS — Z7951 Long term (current) use of inhaled steroids: Secondary | ICD-10-CM | POA: Diagnosis not present

## 2023-01-17 DIAGNOSIS — I509 Heart failure, unspecified: Secondary | ICD-10-CM | POA: Diagnosis not present

## 2023-01-17 DIAGNOSIS — I110 Hypertensive heart disease with heart failure: Secondary | ICD-10-CM | POA: Diagnosis not present

## 2023-01-17 DIAGNOSIS — G43909 Migraine, unspecified, not intractable, without status migrainosus: Secondary | ICD-10-CM | POA: Diagnosis not present

## 2023-01-17 DIAGNOSIS — K219 Gastro-esophageal reflux disease without esophagitis: Secondary | ICD-10-CM | POA: Diagnosis not present

## 2023-01-17 DIAGNOSIS — J4540 Moderate persistent asthma, uncomplicated: Secondary | ICD-10-CM | POA: Diagnosis not present

## 2023-01-17 DIAGNOSIS — I4891 Unspecified atrial fibrillation: Secondary | ICD-10-CM | POA: Diagnosis not present

## 2023-01-17 DIAGNOSIS — K589 Irritable bowel syndrome without diarrhea: Secondary | ICD-10-CM | POA: Diagnosis not present

## 2023-01-17 DIAGNOSIS — E785 Hyperlipidemia, unspecified: Secondary | ICD-10-CM | POA: Diagnosis not present

## 2023-01-17 DIAGNOSIS — Z7901 Long term (current) use of anticoagulants: Secondary | ICD-10-CM | POA: Diagnosis not present

## 2023-01-27 DIAGNOSIS — I4891 Unspecified atrial fibrillation: Secondary | ICD-10-CM | POA: Diagnosis not present

## 2023-01-27 DIAGNOSIS — Z7984 Long term (current) use of oral hypoglycemic drugs: Secondary | ICD-10-CM | POA: Diagnosis not present

## 2023-01-27 DIAGNOSIS — Z7951 Long term (current) use of inhaled steroids: Secondary | ICD-10-CM | POA: Diagnosis not present

## 2023-01-27 DIAGNOSIS — K219 Gastro-esophageal reflux disease without esophagitis: Secondary | ICD-10-CM | POA: Diagnosis not present

## 2023-01-27 DIAGNOSIS — D649 Anemia, unspecified: Secondary | ICD-10-CM | POA: Diagnosis not present

## 2023-01-27 DIAGNOSIS — Z7901 Long term (current) use of anticoagulants: Secondary | ICD-10-CM | POA: Diagnosis not present

## 2023-01-27 DIAGNOSIS — Z9181 History of falling: Secondary | ICD-10-CM | POA: Diagnosis not present

## 2023-01-27 DIAGNOSIS — I509 Heart failure, unspecified: Secondary | ICD-10-CM | POA: Diagnosis not present

## 2023-01-27 DIAGNOSIS — J4540 Moderate persistent asthma, uncomplicated: Secondary | ICD-10-CM | POA: Diagnosis not present

## 2023-01-27 DIAGNOSIS — G43909 Migraine, unspecified, not intractable, without status migrainosus: Secondary | ICD-10-CM | POA: Diagnosis not present

## 2023-01-27 DIAGNOSIS — K589 Irritable bowel syndrome without diarrhea: Secondary | ICD-10-CM | POA: Diagnosis not present

## 2023-01-27 DIAGNOSIS — E785 Hyperlipidemia, unspecified: Secondary | ICD-10-CM | POA: Diagnosis not present

## 2023-01-27 DIAGNOSIS — I110 Hypertensive heart disease with heart failure: Secondary | ICD-10-CM | POA: Diagnosis not present

## 2023-01-27 DIAGNOSIS — G4733 Obstructive sleep apnea (adult) (pediatric): Secondary | ICD-10-CM | POA: Diagnosis not present

## 2023-01-27 DIAGNOSIS — M797 Fibromyalgia: Secondary | ICD-10-CM | POA: Diagnosis not present

## 2023-02-02 DIAGNOSIS — G4733 Obstructive sleep apnea (adult) (pediatric): Secondary | ICD-10-CM | POA: Diagnosis not present

## 2023-02-08 DIAGNOSIS — E1169 Type 2 diabetes mellitus with other specified complication: Secondary | ICD-10-CM | POA: Diagnosis not present

## 2023-02-08 DIAGNOSIS — I10 Essential (primary) hypertension: Secondary | ICD-10-CM | POA: Diagnosis not present

## 2023-02-08 DIAGNOSIS — G4733 Obstructive sleep apnea (adult) (pediatric): Secondary | ICD-10-CM | POA: Diagnosis not present

## 2023-02-08 DIAGNOSIS — I509 Heart failure, unspecified: Secondary | ICD-10-CM | POA: Diagnosis not present

## 2023-02-08 DIAGNOSIS — E782 Mixed hyperlipidemia: Secondary | ICD-10-CM | POA: Diagnosis not present

## 2023-02-08 DIAGNOSIS — N1832 Chronic kidney disease, stage 3b: Secondary | ICD-10-CM | POA: Diagnosis not present

## 2023-02-08 DIAGNOSIS — I480 Paroxysmal atrial fibrillation: Secondary | ICD-10-CM | POA: Diagnosis not present

## 2023-02-16 ENCOUNTER — Encounter: Payer: Self-pay | Admitting: *Deleted

## 2023-02-16 ENCOUNTER — Telehealth: Payer: Self-pay | Admitting: *Deleted

## 2023-02-16 NOTE — Patient Instructions (Signed)
Visit Information  Thank you for taking time to visit with me today. Please don't hesitate to contact me if I can be of assistance to you.  Following are the goals we discussed today:  Continue monitoring blood pressure. Call [facility] if you do not have CPAP delivered in the next week.    Please call the Suicide and Crisis Lifeline: 988 call the [place] National Suicide Prevention Lifeline: 626-125-8088 or TTY: 216-365-9385 TTY 7340669462) to talk to a trained counselor call 1-800-273-TALK (toll free, 24 hour hotline) call 911 if you are experiencing a Mental Health or [place] or need someone to talk to.  Patient verbalizes understanding of instructions and care plan provided today and agrees to view in [place]. Active MyChart status and patient understanding of how to access instructions and care plan via MyChart confirmed with patient.     The patient has been provided with contact information for the care management team and has been advised to call with any health related questions or concerns.   [facility] Care Management Coordinator [phone]

## 2023-02-16 NOTE — Patient Outreach (Signed)
  Care Coordination   Initial Visit Note   02/16/2023 Name: KODEE BLACKWOOD MRN: [id number] DOB: [birth year]  DANELLA AGRICOLA is a 77 y.o. year old female who sees [name], MD for primary care. I spoke with  Vergia Alberts Lowenthal by phone today.  What matters to the patients health and wellness today?  State she feels she is doing well, lives with daughter and has aide twice a week for in home assistance.  Sleep study done, diagnosed with OSA, CPAP ordered.  Denies any urgent concerns, encouraged to contact this care manager with questions.    Goals Addressed             This Visit's Progress    COMPLETED: Care Coordination activities - No follow up needed       Care Coordination Interventions: Patient interviewed about adult health maintenance status including  Falls risk assessment    Regular eye checkups Regular Dental Care    Blood Pressure    Advised patient to discuss  Pneumonia Vaccine Influenza Vaccine COVID vaccination    Diabetes Eye Exam    Diabetes Foot Exam    with primary care provider  SDOH assessment completed Encouraged to follow up with [facility] if no delivery for CPAP in the next week Reviewed upcoming appointments:  Pulmonary on 4/8, PCP on 6/11, and Cardiology on 6/25         SDOH assessments and interventions completed:  Yes  SDOH Interventions Today    Flowsheet Row Most Recent Value  SDOH Interventions   Food Insecurity Interventions Intervention Not Indicated  Housing Interventions Intervention Not Indicated  Transportation Interventions Intervention Not Indicated        Care Coordination Interventions:  Yes, provided   Follow up plan: No further intervention required.   Encounter Outcome:  Pt. Visit Completed   Valente David, RN, MSN, [place] [facility] Care Management Coordinator [phone]

## 2023-02-20 DIAGNOSIS — Z7951 Long term (current) use of inhaled steroids: Secondary | ICD-10-CM | POA: Diagnosis not present

## 2023-02-20 DIAGNOSIS — D649 Anemia, unspecified: Secondary | ICD-10-CM | POA: Diagnosis not present

## 2023-02-20 DIAGNOSIS — K219 Gastro-esophageal reflux disease without esophagitis: Secondary | ICD-10-CM | POA: Diagnosis not present

## 2023-02-20 DIAGNOSIS — I4891 Unspecified atrial fibrillation: Secondary | ICD-10-CM | POA: Diagnosis not present

## 2023-02-20 DIAGNOSIS — E785 Hyperlipidemia, unspecified: Secondary | ICD-10-CM | POA: Diagnosis not present

## 2023-02-20 DIAGNOSIS — J4540 Moderate persistent asthma, uncomplicated: Secondary | ICD-10-CM | POA: Diagnosis not present

## 2023-02-20 DIAGNOSIS — M797 Fibromyalgia: Secondary | ICD-10-CM | POA: Diagnosis not present

## 2023-02-20 DIAGNOSIS — G43909 Migraine, unspecified, not intractable, without status migrainosus: Secondary | ICD-10-CM | POA: Diagnosis not present

## 2023-02-20 DIAGNOSIS — K589 Irritable bowel syndrome without diarrhea: Secondary | ICD-10-CM | POA: Diagnosis not present

## 2023-02-20 DIAGNOSIS — Z7901 Long term (current) use of anticoagulants: Secondary | ICD-10-CM | POA: Diagnosis not present

## 2023-02-20 DIAGNOSIS — Z9181 History of falling: Secondary | ICD-10-CM | POA: Diagnosis not present

## 2023-02-20 DIAGNOSIS — G4733 Obstructive sleep apnea (adult) (pediatric): Secondary | ICD-10-CM | POA: Diagnosis not present

## 2023-02-20 DIAGNOSIS — Z7984 Long term (current) use of oral hypoglycemic drugs: Secondary | ICD-10-CM | POA: Diagnosis not present

## 2023-02-20 DIAGNOSIS — I509 Heart failure, unspecified: Secondary | ICD-10-CM | POA: Diagnosis not present

## 2023-02-20 DIAGNOSIS — I110 Hypertensive heart disease with heart failure: Secondary | ICD-10-CM | POA: Diagnosis not present

## 2023-03-07 DIAGNOSIS — G4733 Obstructive sleep apnea (adult) (pediatric): Secondary | ICD-10-CM | POA: Diagnosis not present

## 2023-03-20 DIAGNOSIS — H6123 Impacted cerumen, bilateral: Secondary | ICD-10-CM | POA: Diagnosis not present

## 2023-03-20 DIAGNOSIS — H903 Sensorineural hearing loss, bilateral: Secondary | ICD-10-CM | POA: Diagnosis not present

## 2023-03-22 DIAGNOSIS — G4733 Obstructive sleep apnea (adult) (pediatric): Secondary | ICD-10-CM | POA: Diagnosis not present

## 2023-03-27 DIAGNOSIS — G4733 Obstructive sleep apnea (adult) (pediatric): Secondary | ICD-10-CM | POA: Diagnosis not present

## 2023-03-27 DIAGNOSIS — R0602 Shortness of breath: Secondary | ICD-10-CM | POA: Diagnosis not present

## 2023-03-28 DIAGNOSIS — H903 Sensorineural hearing loss, bilateral: Secondary | ICD-10-CM | POA: Diagnosis not present

## 2023-04-03 DIAGNOSIS — H903 Sensorineural hearing loss, bilateral: Secondary | ICD-10-CM | POA: Diagnosis not present

## 2023-04-07 DIAGNOSIS — G4733 Obstructive sleep apnea (adult) (pediatric): Secondary | ICD-10-CM | POA: Diagnosis not present

## 2023-04-20 DIAGNOSIS — Z961 Presence of intraocular lens: Secondary | ICD-10-CM | POA: Diagnosis not present

## 2023-04-20 DIAGNOSIS — E119 Type 2 diabetes mellitus without complications: Secondary | ICD-10-CM | POA: Diagnosis not present

## 2023-04-20 DIAGNOSIS — H524 Presbyopia: Secondary | ICD-10-CM | POA: Diagnosis not present

## 2023-05-07 DIAGNOSIS — G4733 Obstructive sleep apnea (adult) (pediatric): Secondary | ICD-10-CM | POA: Diagnosis not present

## 2023-05-23 DIAGNOSIS — E782 Mixed hyperlipidemia: Secondary | ICD-10-CM | POA: Diagnosis not present

## 2023-05-23 DIAGNOSIS — E1169 Type 2 diabetes mellitus with other specified complication: Secondary | ICD-10-CM | POA: Diagnosis not present

## 2023-05-23 DIAGNOSIS — E538 Deficiency of other specified B group vitamins: Secondary | ICD-10-CM | POA: Diagnosis not present

## 2023-05-23 DIAGNOSIS — D500 Iron deficiency anemia secondary to blood loss (chronic): Secondary | ICD-10-CM | POA: Diagnosis not present

## 2023-05-30 DIAGNOSIS — N1831 Chronic kidney disease, stage 3a: Secondary | ICD-10-CM | POA: Diagnosis not present

## 2023-05-30 DIAGNOSIS — F330 Major depressive disorder, recurrent, mild: Secondary | ICD-10-CM | POA: Diagnosis not present

## 2023-05-30 DIAGNOSIS — E538 Deficiency of other specified B group vitamins: Secondary | ICD-10-CM | POA: Diagnosis not present

## 2023-05-30 DIAGNOSIS — E1169 Type 2 diabetes mellitus with other specified complication: Secondary | ICD-10-CM | POA: Diagnosis not present

## 2023-05-30 DIAGNOSIS — Z0000 Encounter for general adult medical examination without abnormal findings: Secondary | ICD-10-CM | POA: Diagnosis not present

## 2023-05-30 DIAGNOSIS — E782 Mixed hyperlipidemia: Secondary | ICD-10-CM | POA: Diagnosis not present

## 2023-05-30 DIAGNOSIS — M1990 Unspecified osteoarthritis, unspecified site: Secondary | ICD-10-CM | POA: Diagnosis not present

## 2023-06-20 DIAGNOSIS — M791 Myalgia, unspecified site: Secondary | ICD-10-CM | POA: Diagnosis not present

## 2023-06-20 DIAGNOSIS — I480 Paroxysmal atrial fibrillation: Secondary | ICD-10-CM | POA: Diagnosis not present

## 2023-07-11 DIAGNOSIS — M48062 Spinal stenosis, lumbar region with neurogenic claudication: Secondary | ICD-10-CM | POA: Diagnosis not present

## 2023-07-11 DIAGNOSIS — M4316 Spondylolisthesis, lumbar region: Secondary | ICD-10-CM | POA: Diagnosis not present

## 2023-07-25 DIAGNOSIS — G4733 Obstructive sleep apnea (adult) (pediatric): Secondary | ICD-10-CM | POA: Diagnosis not present

## 2023-07-25 DIAGNOSIS — E782 Mixed hyperlipidemia: Secondary | ICD-10-CM | POA: Diagnosis not present

## 2023-07-25 DIAGNOSIS — N1832 Chronic kidney disease, stage 3b: Secondary | ICD-10-CM | POA: Diagnosis not present

## 2023-07-25 DIAGNOSIS — I480 Paroxysmal atrial fibrillation: Secondary | ICD-10-CM | POA: Diagnosis not present

## 2023-07-25 DIAGNOSIS — E1169 Type 2 diabetes mellitus with other specified complication: Secondary | ICD-10-CM | POA: Diagnosis not present

## 2023-07-25 DIAGNOSIS — I10 Essential (primary) hypertension: Secondary | ICD-10-CM | POA: Diagnosis not present

## 2023-09-19 DIAGNOSIS — I10 Essential (primary) hypertension: Secondary | ICD-10-CM | POA: Diagnosis not present

## 2023-09-19 DIAGNOSIS — E782 Mixed hyperlipidemia: Secondary | ICD-10-CM | POA: Diagnosis not present

## 2023-09-19 DIAGNOSIS — E1169 Type 2 diabetes mellitus with other specified complication: Secondary | ICD-10-CM | POA: Diagnosis not present

## 2023-09-19 DIAGNOSIS — J40 Bronchitis, not specified as acute or chronic: Secondary | ICD-10-CM | POA: Diagnosis not present

## 2023-09-19 DIAGNOSIS — J45902 Unspecified asthma with status asthmaticus: Secondary | ICD-10-CM | POA: Diagnosis not present

## 2023-09-19 DIAGNOSIS — E1165 Type 2 diabetes mellitus with hyperglycemia: Secondary | ICD-10-CM | POA: Diagnosis not present

## 2023-09-26 DIAGNOSIS — I501 Left ventricular failure: Secondary | ICD-10-CM | POA: Diagnosis not present

## 2023-10-05 ENCOUNTER — Emergency Department: Payer: PPO

## 2023-10-05 ENCOUNTER — Other Ambulatory Visit: Payer: Self-pay

## 2023-10-05 ENCOUNTER — Inpatient Hospital Stay
Admission: EM | Admit: 2023-10-05 | Discharge: 2023-10-20 | DRG: 834 | Disposition: E | Payer: PPO | Attending: Internal Medicine | Admitting: Internal Medicine

## 2023-10-05 DIAGNOSIS — F419 Anxiety disorder, unspecified: Secondary | ICD-10-CM | POA: Diagnosis present

## 2023-10-05 DIAGNOSIS — Z79899 Other long term (current) drug therapy: Secondary | ICD-10-CM

## 2023-10-05 DIAGNOSIS — I5043 Acute on chronic combined systolic (congestive) and diastolic (congestive) heart failure: Secondary | ICD-10-CM | POA: Diagnosis present

## 2023-10-05 DIAGNOSIS — M797 Fibromyalgia: Secondary | ICD-10-CM | POA: Diagnosis present

## 2023-10-05 DIAGNOSIS — A419 Sepsis, unspecified organism: Secondary | ICD-10-CM | POA: Diagnosis not present

## 2023-10-05 DIAGNOSIS — R778 Other specified abnormalities of plasma proteins: Secondary | ICD-10-CM | POA: Diagnosis not present

## 2023-10-05 DIAGNOSIS — Z888 Allergy status to other drugs, medicaments and biological substances status: Secondary | ICD-10-CM

## 2023-10-05 DIAGNOSIS — R631 Polydipsia: Secondary | ICD-10-CM | POA: Diagnosis present

## 2023-10-05 DIAGNOSIS — D509 Iron deficiency anemia, unspecified: Secondary | ICD-10-CM | POA: Diagnosis present

## 2023-10-05 DIAGNOSIS — R579 Shock, unspecified: Secondary | ICD-10-CM | POA: Diagnosis not present

## 2023-10-05 DIAGNOSIS — Z7901 Long term (current) use of anticoagulants: Secondary | ICD-10-CM

## 2023-10-05 DIAGNOSIS — K219 Gastro-esophageal reflux disease without esophagitis: Secondary | ICD-10-CM | POA: Diagnosis present

## 2023-10-05 DIAGNOSIS — E872 Acidosis, unspecified: Secondary | ICD-10-CM | POA: Diagnosis not present

## 2023-10-05 DIAGNOSIS — Z1152 Encounter for screening for COVID-19: Secondary | ICD-10-CM

## 2023-10-05 DIAGNOSIS — I480 Paroxysmal atrial fibrillation: Secondary | ICD-10-CM | POA: Diagnosis present

## 2023-10-05 DIAGNOSIS — N1832 Chronic kidney disease, stage 3b: Secondary | ICD-10-CM | POA: Diagnosis not present

## 2023-10-05 DIAGNOSIS — R509 Fever, unspecified: Secondary | ICD-10-CM | POA: Diagnosis not present

## 2023-10-05 DIAGNOSIS — Z803 Family history of malignant neoplasm of breast: Secondary | ICD-10-CM

## 2023-10-05 DIAGNOSIS — I21A1 Myocardial infarction type 2: Secondary | ICD-10-CM | POA: Diagnosis present

## 2023-10-05 DIAGNOSIS — R0689 Other abnormalities of breathing: Secondary | ICD-10-CM | POA: Diagnosis not present

## 2023-10-05 DIAGNOSIS — D630 Anemia in neoplastic disease: Secondary | ICD-10-CM | POA: Diagnosis present

## 2023-10-05 DIAGNOSIS — D72829 Elevated white blood cell count, unspecified: Secondary | ICD-10-CM | POA: Diagnosis not present

## 2023-10-05 DIAGNOSIS — Z9049 Acquired absence of other specified parts of digestive tract: Secondary | ICD-10-CM

## 2023-10-05 DIAGNOSIS — Z833 Family history of diabetes mellitus: Secondary | ICD-10-CM

## 2023-10-05 DIAGNOSIS — R918 Other nonspecific abnormal finding of lung field: Secondary | ICD-10-CM | POA: Diagnosis not present

## 2023-10-05 DIAGNOSIS — E1122 Type 2 diabetes mellitus with diabetic chronic kidney disease: Secondary | ICD-10-CM | POA: Diagnosis not present

## 2023-10-05 DIAGNOSIS — J9811 Atelectasis: Secondary | ICD-10-CM | POA: Diagnosis not present

## 2023-10-05 DIAGNOSIS — I3139 Other pericardial effusion (noninflammatory): Secondary | ICD-10-CM | POA: Diagnosis not present

## 2023-10-05 DIAGNOSIS — E876 Hypokalemia: Secondary | ICD-10-CM | POA: Diagnosis present

## 2023-10-05 DIAGNOSIS — D696 Thrombocytopenia, unspecified: Secondary | ICD-10-CM | POA: Diagnosis present

## 2023-10-05 DIAGNOSIS — M1990 Unspecified osteoarthritis, unspecified site: Secondary | ICD-10-CM | POA: Diagnosis present

## 2023-10-05 DIAGNOSIS — R0989 Other specified symptoms and signs involving the circulatory and respiratory systems: Secondary | ICD-10-CM | POA: Diagnosis not present

## 2023-10-05 DIAGNOSIS — Z515 Encounter for palliative care: Secondary | ICD-10-CM

## 2023-10-05 DIAGNOSIS — E785 Hyperlipidemia, unspecified: Secondary | ICD-10-CM | POA: Diagnosis not present

## 2023-10-05 DIAGNOSIS — F32A Depression, unspecified: Secondary | ICD-10-CM | POA: Diagnosis not present

## 2023-10-05 DIAGNOSIS — I130 Hypertensive heart and chronic kidney disease with heart failure and stage 1 through stage 4 chronic kidney disease, or unspecified chronic kidney disease: Secondary | ICD-10-CM | POA: Diagnosis not present

## 2023-10-05 DIAGNOSIS — J189 Pneumonia, unspecified organism: Secondary | ICD-10-CM | POA: Diagnosis present

## 2023-10-05 DIAGNOSIS — I309 Acute pericarditis, unspecified: Secondary | ICD-10-CM | POA: Diagnosis not present

## 2023-10-05 DIAGNOSIS — I110 Hypertensive heart disease with heart failure: Secondary | ICD-10-CM | POA: Diagnosis not present

## 2023-10-05 DIAGNOSIS — R7989 Other specified abnormal findings of blood chemistry: Secondary | ICD-10-CM | POA: Diagnosis not present

## 2023-10-05 DIAGNOSIS — Z66 Do not resuscitate: Secondary | ICD-10-CM | POA: Diagnosis present

## 2023-10-05 DIAGNOSIS — R578 Other shock: Secondary | ICD-10-CM | POA: Diagnosis not present

## 2023-10-05 DIAGNOSIS — C9200 Acute myeloblastic leukemia, not having achieved remission: Secondary | ICD-10-CM | POA: Diagnosis not present

## 2023-10-05 DIAGNOSIS — I5033 Acute on chronic diastolic (congestive) heart failure: Secondary | ICD-10-CM | POA: Diagnosis not present

## 2023-10-05 DIAGNOSIS — Z825 Family history of asthma and other chronic lower respiratory diseases: Secondary | ICD-10-CM

## 2023-10-05 DIAGNOSIS — I5023 Acute on chronic systolic (congestive) heart failure: Secondary | ICD-10-CM | POA: Diagnosis not present

## 2023-10-05 DIAGNOSIS — I517 Cardiomegaly: Secondary | ICD-10-CM | POA: Diagnosis not present

## 2023-10-05 DIAGNOSIS — Z8249 Family history of ischemic heart disease and other diseases of the circulatory system: Secondary | ICD-10-CM

## 2023-10-05 DIAGNOSIS — G4733 Obstructive sleep apnea (adult) (pediatric): Secondary | ICD-10-CM | POA: Diagnosis not present

## 2023-10-05 DIAGNOSIS — Z823 Family history of stroke: Secondary | ICD-10-CM

## 2023-10-05 DIAGNOSIS — R0602 Shortness of breath: Secondary | ICD-10-CM | POA: Diagnosis not present

## 2023-10-05 DIAGNOSIS — J9601 Acute respiratory failure with hypoxia: Secondary | ICD-10-CM | POA: Diagnosis present

## 2023-10-05 DIAGNOSIS — I959 Hypotension, unspecified: Principal | ICD-10-CM

## 2023-10-05 DIAGNOSIS — J45909 Unspecified asthma, uncomplicated: Secondary | ICD-10-CM | POA: Diagnosis not present

## 2023-10-05 DIAGNOSIS — I4891 Unspecified atrial fibrillation: Secondary | ICD-10-CM | POA: Diagnosis not present

## 2023-10-05 DIAGNOSIS — K580 Irritable bowel syndrome with diarrhea: Secondary | ICD-10-CM | POA: Diagnosis present

## 2023-10-05 DIAGNOSIS — J90 Pleural effusion, not elsewhere classified: Secondary | ICD-10-CM | POA: Diagnosis not present

## 2023-10-05 DIAGNOSIS — Z7952 Long term (current) use of systemic steroids: Secondary | ICD-10-CM

## 2023-10-05 DIAGNOSIS — D539 Nutritional anemia, unspecified: Secondary | ICD-10-CM | POA: Diagnosis present

## 2023-10-05 DIAGNOSIS — Z90710 Acquired absence of both cervix and uterus: Secondary | ICD-10-CM

## 2023-10-05 DIAGNOSIS — R590 Localized enlarged lymph nodes: Secondary | ICD-10-CM | POA: Diagnosis not present

## 2023-10-05 DIAGNOSIS — R000 Tachycardia, unspecified: Secondary | ICD-10-CM | POA: Diagnosis not present

## 2023-10-05 DIAGNOSIS — Z882 Allergy status to sulfonamides status: Secondary | ICD-10-CM

## 2023-10-05 LAB — GLUCOSE, CAPILLARY
Glucose-Capillary: 152 mg/dL — ABNORMAL HIGH (ref 70–99)
Glucose-Capillary: 181 mg/dL — ABNORMAL HIGH (ref 70–99)

## 2023-10-05 LAB — COMPREHENSIVE METABOLIC PANEL
ALT: 17 U/L (ref 0–44)
AST: 23 U/L (ref 15–41)
Albumin: 3 g/dL — ABNORMAL LOW (ref 3.5–5.0)
Alkaline Phosphatase: 53 U/L (ref 38–126)
Anion gap: 10 (ref 5–15)
BUN: 18 mg/dL (ref 8–23)
CO2: 18 mmol/L — ABNORMAL LOW (ref 22–32)
Calcium: 8.2 mg/dL — ABNORMAL LOW (ref 8.9–10.3)
Chloride: 106 mmol/L (ref 98–111)
Creatinine, Ser: 1.48 mg/dL — ABNORMAL HIGH (ref 0.44–1.00)
GFR, Estimated: 36 mL/min — ABNORMAL LOW (ref >60)
Glucose, Bld: 183 mg/dL — ABNORMAL HIGH (ref 70–99)
Potassium: 3.3 mmol/L — ABNORMAL LOW (ref 3.5–5.1)
Sodium: 134 mmol/L — ABNORMAL LOW (ref 135–145)
Total Bilirubin: 1.5 mg/dL — ABNORMAL HIGH (ref 0.3–1.2)
Total Protein: 5.6 g/dL — ABNORMAL LOW (ref 6.5–8.1)

## 2023-10-05 LAB — CBC WITH DIFFERENTIAL/PLATELET
Abs Immature Granulocytes: 4.28 10*3/uL — ABNORMAL HIGH (ref 0.00–0.07)
Basophils Absolute: 0.1 10*3/uL (ref 0.0–0.1)
Basophils Relative: 0 %
Eosinophils Absolute: 0.2 10*3/uL (ref 0.0–0.5)
Eosinophils Relative: 1 %
HCT: 25 % — ABNORMAL LOW (ref 36.0–46.0)
Hemoglobin: 7.9 g/dL — ABNORMAL LOW (ref 12.0–15.0)
Immature Granulocytes: 17 %
Lymphocytes Relative: 39 %
Lymphs Abs: 9.7 10*3/uL — ABNORMAL HIGH (ref 0.7–4.0)
MCH: 32.1 pg (ref 26.0–34.0)
MCHC: 31.6 g/dL (ref 30.0–36.0)
MCV: 101.6 fL — ABNORMAL HIGH (ref 80.0–100.0)
Monocytes Absolute: 4.5 10*3/uL — ABNORMAL HIGH (ref 0.1–1.0)
Monocytes Relative: 18 %
Neutro Abs: 6.1 10*3/uL (ref 1.7–7.7)
Neutrophils Relative %: 25 %
Platelets: 59 10*3/uL — ABNORMAL LOW (ref 150–400)
RBC: 2.46 MIL/uL — ABNORMAL LOW (ref 3.87–5.11)
RDW: 17.5 % — ABNORMAL HIGH (ref 11.5–15.5)
Smear Review: NORMAL
WBC: 24.7 10*3/uL — ABNORMAL HIGH (ref 4.0–10.5)
nRBC: 2.2 % — ABNORMAL HIGH (ref 0.0–0.2)

## 2023-10-05 LAB — RESP PANEL BY RT-PCR (RSV, FLU A&B, COVID)  RVPGX2
Influenza A by PCR: NEGATIVE
Influenza B by PCR: NEGATIVE
Resp Syncytial Virus by PCR: NEGATIVE
SARS Coronavirus 2 by RT PCR: NEGATIVE

## 2023-10-05 LAB — APTT: aPTT: 56 s — ABNORMAL HIGH (ref 24–36)

## 2023-10-05 LAB — PROCALCITONIN: Procalcitonin: 6.6 ng/mL

## 2023-10-05 LAB — MAGNESIUM: Magnesium: 1.7 mg/dL (ref 1.7–2.4)

## 2023-10-05 LAB — BRAIN NATRIURETIC PEPTIDE: B Natriuretic Peptide: 505.4 pg/mL — ABNORMAL HIGH (ref 0.0–100.0)

## 2023-10-05 LAB — D-DIMER, QUANTITATIVE: D-Dimer, Quant: 1.01 ug{FEU}/mL — ABNORMAL HIGH (ref 0.00–0.50)

## 2023-10-05 LAB — LACTATE DEHYDROGENASE: LDH: 258 U/L — ABNORMAL HIGH (ref 98–192)

## 2023-10-05 LAB — TROPONIN I (HIGH SENSITIVITY)
Troponin I (High Sensitivity): 36 ng/L — ABNORMAL HIGH (ref <18)
Troponin I (High Sensitivity): 155 ng/L (ref <18)
Troponin I (High Sensitivity): 445 ng/L (ref <18)

## 2023-10-05 LAB — PHOSPHORUS: Phosphorus: 3.5 mg/dL (ref 2.5–4.6)

## 2023-10-05 LAB — PROTIME-INR
INR: 2.1 — ABNORMAL HIGH (ref 0.8–1.2)
Prothrombin Time: 23.6 s — ABNORMAL HIGH (ref 11.4–15.2)

## 2023-10-05 LAB — FIBRINOGEN: Fibrinogen: 604 mg/dL — ABNORMAL HIGH (ref 210–475)

## 2023-10-05 LAB — LACTIC ACID, PLASMA
Lactic Acid, Venous: 1.3 mmol/L (ref 0.5–1.9)
Lactic Acid, Venous: 1.4 mmol/L (ref 0.5–1.9)

## 2023-10-05 MED ORDER — PRAVASTATIN SODIUM 20 MG PO TABS
80.0000 mg | ORAL_TABLET | Freq: Every day | ORAL | Status: DC
  Administered 2023-10-06: 80 mg via ORAL
  Filled 2023-10-05: qty 4

## 2023-10-05 MED ORDER — LACTATED RINGERS IV BOLUS
500.0000 mL | Freq: Once | INTRAVENOUS | Status: AC
  Administered 2023-10-05: 500 mL via INTRAVENOUS

## 2023-10-05 MED ORDER — ALPRAZOLAM 0.5 MG PO TABS: 0.5000 mg | ORAL_TABLET | Freq: Every evening | ORAL | Status: DC | PRN

## 2023-10-05 MED ORDER — INSULIN ASPART 100 UNIT/ML IJ SOLN: 0.0000 [IU] | INTRAMUSCULAR | Status: DC

## 2023-10-05 MED ORDER — FENTANYL CITRATE PF 50 MCG/ML IJ SOSY
50.0000 ug | PREFILLED_SYRINGE | Freq: Once | INTRAMUSCULAR | Status: AC
  Administered 2023-10-05: 50 ug via INTRAVENOUS
  Filled 2023-10-05: qty 1

## 2023-10-05 MED ORDER — SODIUM CHLORIDE 0.9 % IV SOLN: 250.0000 mL | INTRAVENOUS | Status: AC

## 2023-10-05 MED ORDER — POTASSIUM CHLORIDE CRYS ER 20 MEQ PO TBCR
40.0000 meq | EXTENDED_RELEASE_TABLET | Freq: Once | ORAL | Status: AC
  Administered 2023-10-05: 40 meq via ORAL
  Filled 2023-10-05: qty 2

## 2023-10-05 MED ORDER — IOHEXOL 350 MG/ML SOLN
80.0000 mL | Freq: Once | INTRAVENOUS | Status: AC | PRN
  Administered 2023-10-05: 80 mL via INTRAVENOUS

## 2023-10-05 MED ORDER — PANTOPRAZOLE SODIUM 40 MG PO TBEC: 40.0000 mg | DELAYED_RELEASE_TABLET | Freq: Two times a day (BID) | ORAL | Status: DC

## 2023-10-05 MED ORDER — DOCUSATE SODIUM 100 MG PO CAPS
100.0000 mg | ORAL_CAPSULE | Freq: Two times a day (BID) | ORAL | Status: DC | PRN
  Administered 2023-10-05 – 2023-10-07 (×3): 100 mg via ORAL
  Filled 2023-10-05 (×3): qty 1

## 2023-10-05 MED ORDER — NOREPINEPHRINE 4 MG/250ML-% IV SOLN
2.0000 ug/min | INTRAVENOUS | Status: DC
  Administered 2023-10-05: 5 ug/min via INTRAVENOUS
  Filled 2023-10-05: qty 250

## 2023-10-05 MED ORDER — SODIUM CHLORIDE 0.9 % IV SOLN
2.0000 g | INTRAVENOUS | Status: DC
  Administered 2023-10-05 – 2023-10-06 (×2): 2 g via INTRAVENOUS
  Filled 2023-10-05 (×4): qty 20

## 2023-10-05 MED ORDER — SODIUM CHLORIDE 0.9 % IV SOLN
500.0000 mg | INTRAVENOUS | Status: DC
  Administered 2023-10-05 – 2023-10-06 (×2): 500 mg via INTRAVENOUS
  Filled 2023-10-05 (×4): qty 5

## 2023-10-05 MED ORDER — PANTOPRAZOLE SODIUM 40 MG PO TBEC
40.0000 mg | DELAYED_RELEASE_TABLET | Freq: Every day | ORAL | Status: DC
  Administered 2023-10-05: 40 mg via ORAL
  Filled 2023-10-05: qty 1

## 2023-10-05 MED ORDER — PANTOPRAZOLE SODIUM 40 MG PO TBEC
40.0000 mg | DELAYED_RELEASE_TABLET | Freq: Two times a day (BID) | ORAL | Status: DC
  Administered 2023-10-06 – 2023-10-07 (×3): 40 mg via ORAL
  Filled 2023-10-05 (×3): qty 1

## 2023-10-05 MED ORDER — VENLAFAXINE HCL ER 75 MG PO CP24
75.0000 mg | ORAL_CAPSULE | Freq: Every day | ORAL | Status: DC
  Administered 2023-10-06 – 2023-10-07 (×2): 75 mg via ORAL
  Filled 2023-10-05 (×2): qty 1

## 2023-10-05 MED ORDER — SODIUM CHLORIDE 0.9 % IV BOLUS (SEPSIS)
500.0000 mL | Freq: Once | INTRAVENOUS | Status: AC
  Administered 2023-10-05: 500 mL via INTRAVENOUS

## 2023-10-05 MED ORDER — MORPHINE SULFATE (PF) 4 MG/ML IV SOLN
4.0000 mg | Freq: Once | INTRAVENOUS | Status: AC
  Administered 2023-10-05: 4 mg via INTRAVENOUS
  Filled 2023-10-05: qty 1

## 2023-10-05 MED ORDER — POLYETHYLENE GLYCOL 3350 17 G PO PACK: 17.0000 g | PACK | Freq: Every day | ORAL | Status: DC | PRN

## 2023-10-05 NOTE — Sepsis Progress Note (Signed)
Elink monitoring for the code sepsis protocol.  

## 2023-10-05 NOTE — Progress Notes (Signed)
PHARMACY CONSULT NOTE - FOLLOW UP  Pharmacy Consult for Electrolyte Monitoring and Replacement   Recent Labs: Potassium (mmol/L)  Date Value  10/05/2023 3.3 (L)  08/13/2014 4.0   Magnesium (mg/dL)  Date Value  47/82/9562 1.8   Calcium (mg/dL)  Date Value  13/07/6577 8.2 (L)   Calcium, Total (mg/dL)  Date Value  46/96/2952 8.6   Albumin (g/dL)  Date Value  84/13/2440 3.0 (L)  08/11/2014 3.5   Sodium (mmol/L)  Date Value  10/05/2023 134 (L)  08/13/2014 137     Assessment: 77 year old female admitted with sepsis, being admitted to CCU. Pharmacy has been consulted to manage electrolyte replacement.  Pressors: Levophed Antibiotics: ceftriaxone and azithromycin  Goal of Therapy:  Electrolytes within normal limits  Plan:  K+ 3.3. Kcl PO x 1 dose Follow up electrolytes in AM   Elliot Gurney, PharmD, BCPS Clinical Pharmacist  10/05/2023 8:50 PM

## 2023-10-05 NOTE — H&P (Signed)
NAME:  Sara FAUROT, MRN:  [id number], DOB:  Aug 03, 1946, LOS: 0 ADMISSION DATE:  10-10-2023, CONSULTATION DATE:  10-10-2023 REFERRING MD:  [name], CHIEF COMPLAINT:  Shortness of Breath  History of Present Illness:  77 yo F presenting to [facility] ED from home via EMS on 10/10/23 for evaluation of dyspnea.  History provided per chart review and bedside patient interview. Patient reports having increased shortness of breath from baseline over the last 2 weeks with correlating productive cough and yellow sputum. Over the last 3 days the dyspnea worsened and then on 10-10-2023 she started experiencing persistent 10/10 chest pain and tightness as well as a subjective chills and fever which she treated with Tylenol x 2. She denied urinary symptoms, abdominal pain/nausea/ vomiting, but did admit to one episode of loose stools- she denied any blood in her stool. She denies any tobacco use history, ETOH or recreational drug use. She reports taking all medication as prescribed with the exception of her lasix. Instead of taking this daily she reports to only taking it when she "feels wheezy". Of note patient was recently seen by PCP who noted drop in Hgb from 12.3 to 9.8, patient was started on iron supplementation with plans to check lab work in a few weeks. She had also been taken off her metformin after losing some weight, but it was noted on her last PCP visit that she has regained the lost weight and was experiencing polydipsia. Metformin was restarted.   EMS noted hypoxia at 80% and tachycardia on arrival. Patient was placed on a NRB during transport with improvement in Spo2 to 99%  ED course: Upon arrival patient alert and oriented with initial EKG shows A-fib RVR in the 160's and BP soft but stable. Patient received 500 mL bolus while awaiting lab work and she converted into NSR/low ST. Labs significant for leukocytosis without lactic acidosis, continued anemia, thrombocytopenia, mild NAGMA with close to  baseline kidney function, elevated BNP, LDH, troponin suggestive of demand ischemia, elevated PCT, D.Dimer, INR and fibrinogen. Imaging revealed small bilateral pleural effusions and possible left lower lobe pneumonia. BP dropped and peripheral levophed was started. Medications given: Fentanyl, morphine, K+, azithromycin & rocephin, 500 mL LR bolus, IV contrast, levophed drip started Initial Vitals: 98.3, 25, 144, 100/84 & 99% on RA Significant labs: (Labs/ Imaging personally reviewed) I, Cheryll Cockayne Rust-Chester, AGACNP-BC, personally viewed and interpreted this ECG. EKG Interpretation: Date: 10-Oct-2023, EKG Time: 17:13, Rate: 163, Rhythm: A-fib, QRS Axis: LAD, Intervals: A-fib, ST/T Wave abnormalities: none,  Narrative Interpretation: A-fib RVR Chemistry: Na+:134, K+: 3.3, BUN/Cr.: 18/1.48, Serum CO2/ AG: 18/10, albumin: 3.0, T.Bili: 1.5 Hematology: WBC: 24.7, Hgb: 7.9, plt: 59  Troponin: 36 > 155, BNP: 505.4, Lactic/ PCT: 1.3 > 1.4/ 6.6, LDH: 258, D-Dimer: 1.01, Fibrinogen: 604, INR: 2.1  COVID-19 & Influenza A/B: negative  CXR 10-Oct-2023: Cardiomegaly with mild central congestion and probable small pleural effusions CT angio chest PE 10-Oct-2023: Negative for acute pulmonary embolus. Cardiomegaly with small left greater than right pleural effusions. Mild diffuse mosaicism which could be due to small airways disease. Low-grade edema could also be considered. CT abdomen/pelvis wo contrast 2023/10/10: No CT evidence for acute intra-abdominal or pelvic abnormality  PCCM consulted for admission due to circulatory shock requiring peripheral vasopressor support in the setting of CAP & HFpEF exacerbation.  Pertinent  Medical History  CKD 3b HTN OSA Fibromyalgia Anxiety & Depression T2DM HLD Rheumatoid factor + IBS Atrial Fibrillation on Eliquis  Significant Hospital Events: Including procedures, antibiotic  start and stop dates in addition to other pertinent events   10-14-2023: Admit to ICU with  circulatory shock on peripheral vasopressors in the setting of suspected CAP & HFpEF exacerbation  Interim History / Subjective:  Patient alert and oriented with family bedside. Plan of care discussed, all questions and concerns answered at this time.  Objective   Blood pressure (!) 103/54, pulse (!) 103, temperature 98.3 F (36.8 C), temperature source Oral, resp. rate (!) 30, height 5\' 2"  (1.575 m), weight 71.7 kg, SpO2 95%.        Intake/Output Summary (Last 24 hours) at 2023/10/14 2042 Last data filed at 14-Oct-2023 1946 Gross per 24 hour  Intake 850 ml  Output --  Net 850 ml   Filed Weights   10/14/23 1721  Weight: 71.7 kg    Examination: General: Adult female, acutely ill, lying in bed, NAD HEENT: MM pink/moist, anicteric, atraumatic, neck supple Neuro: A&O x 4, able to follow commands, PERRL +3, MAE CV: s1s2 irregular, NSR with PAC's on monitor, no r/m/g Pulm: Regular, non labored on RA, breath sounds diminished throughout GI: soft, rounded, non tender, bs x 4 Skin: no rashes/lesions noted Extremities: warm/dry, pulses + 2 R/P, no edema noted  Resolved Hospital Problem list     Assessment & Plan:  Leukocytosis without Lactic Acidosis suspect s/t CAP Circulatory Shock- infectious vs cardiogenic Initial interventions/workup included: 500 L of NS/LR & Azithromycin / Ceftriaxone - f/u cultures, trend lactic/ PCT - Daily CBC, monitor WBC/ fever curve - IV antibiotics: ceftriaxone & azithromycin - Continue vasopressors to maintain MAP< 65: norepinephrine  Acute on Chronic HFpEF exacerbation PAF on Eliquis Elevated Troponin secondary to N-STEMI vs demand ischemia PMHx: HTN, HLD - Trend troponins >> consider heparin drip with pharmacy consultation. Eliquis on hold due to worsening anemia and new thrombocytopenia. SCD's in place. - hold outpatient lopressor & olmesartan due to hypotension > consider restarting as patient stabilizes - Echocardiogram ordered - f/u lipid  panel, continue pravastatin - Continuous cardiac monitoring  - Daily weights to assess volume status - Diurese with the use of IV lasix as hemodynamics and renal function allow - Consider Cardiology consult, depending on above work up  CKD Stage 3b NAGMA Hypokalemia - Strict I/O's: alert provider if UOP < 0.5 mL/kg/hr - conservative IVF resuscitation - Daily BMP, replace electrolytes PRN - Avoid nephrotoxic agents as able, ensure adequate renal perfusion - K+ supplementation given  Worsening Anemia secondary to unknown etiology in the setting of Chronic Disease & Critical Illness New Thrombocytopenia s/t unknown etiology Pathologist smear review is pending, but initial reports normal platelet morphology - Monitor for s/s of bleeding - Daily CBC, Monitor coag panel, f/u iron anemia panel - continue PPI BID - Transfuse for Hgb <7 & Consider transfusion of platelets if < 10 - consider Hematology consultation, depending on work up above  OSA Patient has a CPAP ordered but has not been wearing it due to discomfort - CPAP ordered QHS - Supplemental oxygen as needed, to maintain SpO2 > 90%  Fibromyalgia Anxiety & Depression - continue Effexor & Xanax PRN - consider adding back additional pain control PRN depending on hemodynamics  Type 2 Diabetes Mellitus - Monitor CBG Q 4 hours - outpatient Metformin on hold - target range while in ICU: 140-180 - follow ICU hyper/hypo-glycemia protocol  Best Practice (right click and "Reselect all SmartList Selections" daily)  Diet/type: NPO w/ oral meds DVT prophylaxis: SCD GI prophylaxis: PPI Lines: N/A Foley:  N/A Code Status:  DNR Last date of multidisciplinary goals of care discussion [15-Oct-2023- discussion held bedside with family present including children and sister. Patient reported she had a living will confirming DNR/DNI status]  Labs   CBC: Recent Labs  Lab Oct 15, 2023 1727  WBC 24.7*  NEUTROABS 6.1  HGB 7.9*  HCT 25.0*   MCV 101.6*  PLT 59*    Basic Metabolic Panel: Recent Labs  Lab 10-15-2023 1727  NA 134*  K 3.3*  CL 106  CO2 18*  GLUCOSE 183*  BUN 18  CREATININE 1.48*  CALCIUM 8.2*   GFR: Estimated Creatinine Clearance: 29.5 mL/min (A) (by C-G formula based on SCr of 1.48 mg/dL (H)). Recent Labs  Lab Oct 15, 2023 1727 2023-10-15 2001  WBC 24.7*  --   LATICACIDVEN 1.3 1.4    Liver Function Tests: Recent Labs  Lab 10/15/2023 1727  AST 23  ALT 17  ALKPHOS 53  BILITOT 1.5*  PROT 5.6*  ALBUMIN 3.0*   No results for input(s): "LIPASE", "AMYLASE" in the last 168 hours. No results for input(s): "AMMONIA" in the last 168 hours.  ABG    Component Value Date/Time   HCO3 25.4 07/29/2022 0110   ACIDBASEDEF 0.4 07/29/2022 0110   O2SAT 74.1 07/29/2022 0110     Coagulation Profile: Recent Labs  Lab 10-15-2023 1727  INR 2.1*    Cardiac Enzymes: No results for input(s): "CKTOTAL", "CKMB", "CKMBINDEX", "TROPONINI" in the last 168 hours.  HbA1C: Hgb A1c MFr Bld  Date/Time Value Ref Range Status  07/18/2022 08:25 PM 6.2 (H) 4.8 - 5.6 % Final    Comment:    (NOTE) Pre diabetes:          5.7%-6.4%  Diabetes:              >6.4%  Glycemic control for   <7.0% adults with diabetes   09/05/2020 01:16 AM 5.6 4.8 - 5.6 % Final    Comment:    (NOTE) Pre diabetes:          5.7%-6.4%  Diabetes:              >6.4%  Glycemic control for   <7.0% adults with diabetes     CBG: No results for input(s): "GLUCAP" in the last 168 hours.  Review of Systems: Positives in BOLD  Gen: Denies fever, chills, weight change, fatigue, night sweats HEENT: Denies blurred vision, double vision, hearing loss, tinnitus, sinus congestion, rhinorrhea, sore throat, neck stiffness, dysphagia PULM: Denies shortness of breath, cough, sputum production, hemoptysis, wheezing CV: Denies chest pain, edema, orthopnea, paroxysmal nocturnal dyspnea, palpitations GI: Denies abdominal pain, nausea, vomiting, diarrhea,  hematochezia, melena, constipation, change in bowel habits GU: Denies dysuria, hematuria, polyuria, oliguria, urethral discharge Endocrine: Denies hot or cold intolerance, polyuria, polyphagia or appetite change Derm: Denies rash, dry skin, scaling or peeling skin change Heme: Denies easy bruising, bleeding, bleeding gums Neuro: Denies headache, numbness, weakness, slurred speech, loss of memory or consciousness  Past Medical History:  She,  has a past medical history of Anxiety, Chronic kidney disease, Depression, Diabetes mellitus without complication (HCC), Dyspnea, Elevated lipids, Fibromyalgia, GERD (gastroesophageal reflux disease), Headache, Hypertension, OA (osteoarthritis), Pneumonia, and Sleep apnea.   Surgical History:   Past Surgical History:  Procedure Laterality Date   APPENDECTOMY     BIOPSY  07/29/2022   Procedure: BIOPSY;  Surgeon: Shellia Cleverly, DO;  Location: [facility] ENDOSCOPY;  Service: Gastroenterology;;   CHOLECYSTECTOMY     COLONOSCOPY WITH PROPOFOL N/A 07/07/2016   Procedure: COLONOSCOPY WITH PROPOFOL;  Surgeon: Scot Jun, MD;  Location: [facility] ENDOSCOPY;  Service: Endoscopy;  Laterality: N/A;   ESOPHAGOGASTRODUODENOSCOPY     EYE SURGERY  2013   bilateral cataract   FLEXIBLE SIGMOIDOSCOPY     FLEXIBLE SIGMOIDOSCOPY N/A 07/29/2022   Procedure: FLEXIBLE SIGMOIDOSCOPY;  Surgeon: Shellia Cleverly, DO;  Location: [facility] ENDOSCOPY;  Service: Gastroenterology;  Laterality: N/A;   nanoflex Bilateral    POLYPECTOMY  07/29/2022   Procedure: POLYPECTOMY;  Surgeon: Shellia Cleverly, DO;  Location: [facility] ENDOSCOPY;  Service: Gastroenterology;;   TOTAL VAGINAL HYSTERECTOMY  1976     Social History:   reports that she has never smoked. She has never used smokeless tobacco. She reports that she does not drink alcohol and does not use drugs.   Family History:  Her family history includes Breast cancer (age of onset: 60) in her paternal aunt; CAD in her father; COPD in her  father; CVA in her mother; Diabetes in her father; Hypertension in her mother.   Allergies Allergies  Allergen Reactions   Elemental Sulfur Rash   Sulfa Antibiotics Rash   Tricor [Fenofibrate] Rash     Home Medications  Prior to Admission medications   Medication Sig Start Date End Date Taking? Authorizing Provider  acetaminophen (TYLENOL) 500 MG tablet Take 2 tablets (1,000 mg total) by mouth 3 (three) times daily. 08/04/22   Rhetta Mura, MD  ALPRAZolam Prudy Feeler) 0.5 MG tablet Take 0.5 mg by mouth 2 (two) times daily as needed for anxiety or sleep.  01/11/17   [provider]  apixaban (ELIQUIS) 5 MG TABS tablet Take 1 tablet (5 mg total) by mouth 2 (two) times daily. 08/04/22   Rhetta Mura, MD  cholecalciferol (VITAMIN D3) 25 MCG (1000 UNIT) tablet Take 1,000 Units by mouth daily.    [provider]  cyclobenzaprine (FLEXERIL) 5 MG tablet Take 1 tablet (5 mg total) by mouth 3 (three) times daily as needed for muscle spasms. 07/20/22   Tressie Stalker, MD  docusate sodium (COLACE) 100 MG capsule Take 1 capsule (100 mg total) by mouth 2 (two) times daily. 07/20/22   Tressie Stalker, MD  furosemide (LASIX) 40 MG tablet Take 1 tablet (40 mg total) by mouth daily. 08/05/22   Rhetta Mura, MD  metFORMIN (GLUCOPHAGE) 500 MG tablet Take 500 mg by mouth daily.     [provider]  metoprolol tartrate (LOPRESSOR) 50 MG tablet Take 1 tablet (50 mg total) by mouth 2 (two) times daily. 08/04/22   Rhetta Mura, MD  Multiple Vitamin (MULTIVITAMIN WITH MINERALS) TABS tablet Take 1 tablet by mouth daily.    [provider]  nystatin-triamcinolone (MYCOLOG II) cream Apply 1 application  topically daily as needed (rash under breast). 07/26/18   [provider]  olmesartan (BENICAR) 20 MG tablet Take 20 mg by mouth daily. 07/26/20   [provider]  oxyCODONE (OXY IR/ROXICODONE) 5 MG immediate release tablet Take 1 tablet (5 mg total)  by mouth every 6 (six) hours as needed for severe pain. 08/04/22   Rhetta Mura, MD  pantoprazole (PROTONIX) 40 MG tablet Take 40 mg by mouth 2 (two) times daily.     [provider]  polyethylene glycol powder (GLYCOLAX/MIRALAX) powder Take 17 g by mouth daily as needed for moderate constipation. 02/15/17   [provider]  pravastatin (PRAVACHOL) 80 MG tablet Take 80 mg by mouth daily.    [provider]  predniSONE (DELTASONE) 10 MG tablet Take 3 tablets (30 mg total) by  mouth daily with breakfast. 08/05/22   Rhetta Mura, MD  promethazine (PHENERGAN) 25 MG tablet Take 25 mg by mouth every 6 (six) hours as needed for nausea or vomiting.    [provider]  sucralfate (CARAFATE) 1 g tablet Take 1 g by mouth 2 (two) times daily. 05/27/22   [provider]  topiramate (TOPAMAX) 100 MG tablet Take 100 mg by mouth at bedtime. 04/11/22   [provider]  traZODone (DESYREL) 100 MG tablet Take 100 mg by mouth at bedtime.    [provider]  venlafaxine XR (EFFEXOR-XR) 75 MG 24 hr capsule Take 75 mg by mouth daily. 08/24/20   [provider]     Critical care time: 70 minutes       Betsey Holiday, AGACNP-BC Acute Care Nurse Practitioner [facility]   [phone] / [phone] Please see Amion for details.

## 2023-10-05 NOTE — Progress Notes (Signed)
eLink Physician-Brief Progress Note Patient Name: Sara Mcdonald DOB: 08-29-1946 MRN: [id number]   Date of Service  10/05/2023  HPI/Events of Note  77 year old female with a history of chronic kidney disease, fibromyalgia, hypertension, atrial fibrillation on Eliquis therapy who presents to the emergency department for evaluation of dyspnea found to be in vasodilatory shock and heart failure exacerbation thought to be secondary to community-acquired pneumonia.  On examination she is tachycardic, tachypneic and has wide pulse pressures with MAP of 69.  Saturating 96% on room air.  Minimal norepinephrine  Results consistent with some electrolyte disturbances, elevated creatinine, elevated BNP and LDH and uptrending troponin.  Leukocytosis to 24.7 and macrocytic anemia at 7.9 INR 2.1.  CT chest PE study with no evidence of PE, mosaicism consistent with pulmonary hypertension versus small airway disease.  No intra-abdominal findings.  eICU Interventions  Maintain community-acquired pneumonia coverage.  Given chronic intermittent prednisone use, may broaden antibiotics if unresponsive.  Maintain norepinephrine as needed for MAP greater than 65  SCDs for DVT prophylaxis in the setting of worsening anemia PPI twice daily for GI prophylaxis        Katherine Syme 10/05/2023, 11:53 PM

## 2023-10-05 NOTE — Progress Notes (Signed)
CODE SEPSIS - PHARMACY COMMUNICATION  **Broad Spectrum Antibiotics should be administered within 1 hour of Sepsis diagnosis**  Time Code Sepsis Called/Page Received: 1727  Antibiotics Ordered: ceftriaxone and azithromycin  Time of 1st antibiotic administration: 1759  Additional action taken by pharmacy: none  If necessary, Name of Provider/Nurse Contacted: n/a    Elliot Gurney, PharmD, BCPS Clinical Pharmacist  10/05/2023 5:56 PM

## 2023-10-05 NOTE — ED Notes (Signed)
Patient returned from CT

## 2023-10-05 NOTE — ED Provider Notes (Signed)
[facility] Provider Note    Event Date/Time   First MD Initiated Contact with Patient 10/05/23 1708     (approximate)   History   Shortness of Breath   HPI  Sara Mcdonald is a 77 y.o. female past medical history significant for atrial fibrillation on Eliquis, asthma, diabetes, hypertension, who presents to the emergency department for not feeling well.  Patient states that she has been having shortness of breath for the past 3 days.  Started having chest pain and tightness sensation today.  Also started having a fever today and a cough.  Noted with EMS to have an elevated heart rate.  Denies any nausea or vomiting.  Does endorse some diarrhea.  No blood in her stool.  Denies any dysuria, urinary urgency or frequency.  Denies any falls or trauma.  Denies history of DVT or PE.  States that she has been compliant with her medications but did not take it today secondary to not feeling well.     Physical Exam   Triage Vital Signs: ED Triage Vitals [10/05/23 1710]  Encounter Vitals Group     BP      Systolic BP Percentile      Diastolic BP Percentile      Pulse      Resp      Temp      Temp src      SpO2 99 %     Weight      Height      Head Circumference      Peak Flow      Pain Score      Pain Loc      Pain Education      Exclude from Growth Chart     Most recent vital signs: Vitals:   10/05/23 2120 10/05/23 2130  BP: 116/63 (!) 116/58  Pulse: 98 100  Resp: 17 (!) 26  Temp:    SpO2: 94% 92%    Physical Exam Constitutional:      General: She is in acute distress.     Appearance: She is well-developed. She is ill-appearing and toxic-appearing.  HENT:     Head: Atraumatic.  Eyes:     Conjunctiva/sclera: Conjunctivae normal.  Cardiovascular:     Rate and Rhythm: Tachycardia present. Rhythm irregular.  Pulmonary:     Effort: Tachypnea and respiratory distress present.     Breath sounds: Rales present. No decreased breath sounds.      Comments: Coughing in the room Abdominal:     General: There is no distension.     Palpations: Abdomen is soft.     Tenderness: There is no abdominal tenderness.  Genitourinary:    Rectum: Normal.     Comments: No gross blood or melena Musculoskeletal:        General: Normal range of motion.     Cervical back: Normal range of motion.     Right lower leg: No edema.     Left lower leg: No edema.     Comments: +2 radial and DP pulses that are equal bilaterally  Skin:    General: Skin is warm.     Capillary Refill: Capillary refill takes less than 2 seconds.  Neurological:     General: No focal deficit present.     Mental Status: She is alert. Mental status is at baseline.  Psychiatric:        Mood and Affect: Mood normal.     IMPRESSION /  MDM / ASSESSMENT AND PLAN / ED COURSE  I reviewed the triage vital signs and the nursing notes.  Differential diagnosis including infectious process, pneumonia, atrial fibrillation with a rapid rate, ACS, pulmonary embolism.  On arrival patient with a soft blood pressure and tachycardia.  More concerning for sepsis, patient endorsed a temperature at home however afebrile at this time.  Felt that 30 cc/kg of IV fluids may be detrimental to the patient given her shortness of breath, given a 500 bolus and will reevaluate.  Started on antibiotics to cover for community-acquired pneumonia.  EKG  I, Corena Herter, the attending physician, personally viewed and interpreted this ECG.   Rate: 163  Rhythm: Atrial fibrillation  Axis: Normal  Intervals: Normal  ST&T Change: None  Atrial fibrillation with rapid rate  (110-180) while on cardiac telemetry.  RADIOLOGY I independently reviewed imaging, my interpretation of imaging: CT of the chest with no obvious pulmonary embolism.  Appears to have pulmonary edema and a pleural effusion.  CT scan of the abdomen and pelvis read as no acute findings.  Cardiomegaly.  No obvious pulmonary embolism.  Pleural  effusion.  LABS (all labs ordered are listed, but only abnormal results are displayed) Labs interpreted as -    Labs Reviewed  COMPREHENSIVE METABOLIC PANEL - Abnormal; Notable for the following components:      Result Value   Sodium 134 (*)    Potassium 3.3 (*)    CO2 18 (*)    Glucose, Bld 183 (*)    Creatinine, Ser 1.48 (*)    Calcium 8.2 (*)    Total Protein 5.6 (*)    Albumin 3.0 (*)    Total Bilirubin 1.5 (*)    GFR, Estimated 36 (*)    All other components within normal limits  CBC WITH DIFFERENTIAL/PLATELET - Abnormal; Notable for the following components:   WBC 24.7 (*)    RBC 2.46 (*)    Hemoglobin 7.9 (*)    HCT 25.0 (*)    MCV 101.6 (*)    RDW 17.5 (*)    Platelets 59 (*)    nRBC 2.2 (*)    Lymphs Abs 9.7 (*)    Monocytes Absolute 4.5 (*)    Abs Immature Granulocytes 4.28 (*)    All other components within normal limits  PROTIME-INR - Abnormal; Notable for the following components:   Prothrombin Time 23.6 (*)    INR 2.1 (*)    All other components within normal limits  APTT - Abnormal; Notable for the following components:   aPTT 56 (*)    All other components within normal limits  BRAIN NATRIURETIC PEPTIDE - Abnormal; Notable for the following components:   B Natriuretic Peptide 505.4 (*)    All other components within normal limits  LACTATE DEHYDROGENASE - Abnormal; Notable for the following components:   LDH 258 (*)    All other components within normal limits  FIBRINOGEN - Abnormal; Notable for the following components:   Fibrinogen 604 (*)    All other components within normal limits  D-DIMER, QUANTITATIVE - Abnormal; Notable for the following components:   D-Dimer, Quant 1.01 (*)    All other components within normal limits  GLUCOSE, CAPILLARY - Abnormal; Notable for the following components:   Glucose-Capillary 181 (*)    All other components within normal limits  TROPONIN I (HIGH SENSITIVITY) - Abnormal; Notable for the following components:    Troponin I (High Sensitivity) 36 (*)    All other components  within normal limits  TROPONIN I (HIGH SENSITIVITY) - Abnormal; Notable for the following components:   Troponin I (High Sensitivity) 155 (*)    All other components within normal limits  TROPONIN I (HIGH SENSITIVITY) - Abnormal; Notable for the following components:   Troponin I (High Sensitivity) 445 (*)    All other components within normal limits  RESP PANEL BY RT-PCR (RSV, FLU A&B, COVID)  RVPGX2  CULTURE, BLOOD (ROUTINE X 2)  CULTURE, BLOOD (ROUTINE X 2)  EXPECTORATED SPUTUM ASSESSMENT W GRAM STAIN, RFLX TO RESP C  LACTIC ACID, PLASMA  LACTIC ACID, PLASMA  MAGNESIUM  PROCALCITONIN  PHOSPHORUS  URINALYSIS, W/ REFLEX TO CULTURE (INFECTION SUSPECTED)  PATHOLOGIST SMEAR REVIEW  CBC  MAGNESIUM  PHOSPHORUS  BLOOD GAS, ARTERIAL  BASIC METABOLIC PANEL  VITAMIN B12  FOLATE  IRON AND TIBC  FERRITIN  RETICULOCYTES  STREP PNEUMONIAE URINARY ANTIGEN  LEGIONELLA PNEUMOPHILA SEROGP 1 UR AG     MDM  On arrival patient tachycardic and ill-appearing.  Blood cultures obtained and started on antibiotics to cover for community-acquired pneumonia.  Felt that 30 cc/kg of IV fluids may be detrimental to the patient given her concern for possible heart failure, given a 500 bolus and will reevaluate.  On reevaluation patient continued to have persistent tachycardia.  Started to give another 500 bolus of IV fluids given that she was fluid responsive however after chest x-ray showing significant pulmonary edema discontinued bolus.  Patient converted back to normal sinus rhythm on her own without any intervention.  Appears to be sinus tachycardia with no significant ST changes.  No signs of a STEMI.  Patient with elevated troponin, repeat troponin is only mildly elevated.  Concern for heart failure exacerbation.  Multiple lab work abnormalities including significant leukocytosis, anemia and new thrombocytopenia.  Peripheral smear was  ordered.  Creatinine is at baseline.  Have a low suspicion for TTP or HUS.  No signs of DIC at this time.  Persistently hypotensive, started on peripheral Levophed with improvement of her blood pressure.  Consulted and discussed the patient's case with ICU team, they came and evaluated the patient in the emergency department, patient is DNR, DNI.  Admitted to the ICU.     PROCEDURES:  Critical Care performed: yes  .Critical Care  Performed by: Corena Herter, MD Authorized by: Corena Herter, MD   Critical care provider statement:    Critical care time (minutes):  90   Critical care time was exclusive of:  Separately billable procedures and treating other patients   Critical care was necessary to treat or prevent imminent or life-threatening deterioration of the following conditions:  Cardiac failure   Critical care was time spent personally by me on the following activities:  Development of treatment plan with patient or surrogate, discussions with consultants, evaluation of patient's response to treatment, examination of patient, ordering and review of laboratory studies, ordering and review of radiographic studies, ordering and performing treatments and interventions, pulse oximetry, re-evaluation of patient's condition and review of old charts   Patient's presentation is most consistent with acute presentation with potential threat to life or bodily function.   MEDICATIONS ORDERED IN ED: Medications  cefTRIAXone (ROCEPHIN) 2 g in sodium chloride 0.9 % 100 mL IVPB (0 g Intravenous Stopped 10/05/23 1846)  azithromycin (ZITHROMAX) 500 mg in sodium chloride 0.9 % 250 mL IVPB (0 mg Intravenous Stopped 10/05/23 1946)  norepinephrine (LEVOPHED) 4mg  in (0.016 mg/mL) premix infusion (1 mcg/min Intravenous Rate/Dose Change 10/05/23 2230)  docusate  sodium (COLACE) capsule 100 mg (100 mg Oral Given 10/05/23 2047)  polyethylene glycol (MIRALAX / GLYCOLAX) packet 17 g (has no  administration in time range)  ALPRAZolam (XANAX) tablet 0.5 mg (has no administration in time range)  pravastatin (PRAVACHOL) tablet 80 mg (has no administration in time range)  venlafaxine XR (EFFEXOR-XR) 24 hr capsule 75 mg (has no administration in time range)  pantoprazole (PROTONIX) EC tablet 40 mg (has no administration in time range)  0.9 %  sodium chloride infusion (has no administration in time range)  sodium chloride 0.9 % bolus 500 mL (0 mLs Intravenous Stopped 10/05/23 1826)  fentaNYL (SUBLIMAZE) injection 50 mcg (50 mcg Intravenous Given 10/05/23 1812)  iohexol (OMNIPAQUE) 350 MG/ML injection 80 mL (80 mLs Intravenous Contrast Given 10/05/23 1934)  fentaNYL (SUBLIMAZE) injection 50 mcg (50 mcg Intravenous Given 10/05/23 1928)  morphine (PF) 4 MG/ML injection 4 mg (4 mg Intravenous Given 10/05/23 2034)  potassium chloride SA (KLOR-CON M) CR tablet 40 mEq (40 mEq Oral Given 10/05/23 2117)  lactated ringers bolus 500 mL (500 mLs Intravenous New Bag/Given 10/05/23 2244)    FINAL CLINICAL IMPRESSION(S) / ED DIAGNOSES   Final diagnoses:  Hypotension, unspecified hypotension type  Atrial fibrillation with rapid ventricular response (HCC)  Acute on chronic systolic congestive heart failure (HCC)     Rx / DC Orders   ED Discharge Orders     None        Note:  This document was prepared using Dragon voice recognition software and may include unintentional dictation errors.   Corena Herter, MD 10/05/23 2312

## 2023-10-05 NOTE — ED Notes (Signed)
Pads on pt for possible cardioversion. [name] at bedside explaining with family. Levophed started.

## 2023-10-05 NOTE — ED Notes (Signed)
Start levophed at 5 mcg/min per [name] verbal order.

## 2023-10-05 NOTE — ED Triage Notes (Signed)
Patient to ED from home via [facility] for shortness of breath. Patient states she has not been feeling well the past 2 days. Patient has been very weak and states she laid in bed all day today. Today, the patient's daughter called EMS due to the patient having low oxygen and an elevated HR. Upon EMS arrival, patient had an O2 sat of 80% on RA. Patient was placed on 10L nonrebreather and her O2 came up to 99%. In triage, patient has dyspnea at rest. Patient has a Hx of afib. Patient takes blood thinners but missed her dose today.

## 2023-10-06 ENCOUNTER — Inpatient Hospital Stay: Payer: PPO

## 2023-10-06 ENCOUNTER — Inpatient Hospital Stay (HOSPITAL_COMMUNITY)
Admit: 2023-10-06 | Discharge: 2023-10-06 | Disposition: A | Discharge status: Home / Self Care | Payer: PPO | Attending: Pulmonary Disease | Admitting: Pulmonary Disease

## 2023-10-06 DIAGNOSIS — R579 Shock, unspecified: Secondary | ICD-10-CM | POA: Diagnosis not present

## 2023-10-06 DIAGNOSIS — R7989 Other specified abnormal findings of blood chemistry: Secondary | ICD-10-CM

## 2023-10-06 DIAGNOSIS — I5033 Acute on chronic diastolic (congestive) heart failure: Secondary | ICD-10-CM

## 2023-10-06 LAB — MAGNESIUM: Magnesium: 1.8 mg/dL (ref 1.7–2.4)

## 2023-10-06 LAB — URINALYSIS, W/ REFLEX TO CULTURE (INFECTION SUSPECTED)
Bacteria, UA: NONE SEEN
Bilirubin Urine: NEGATIVE
Glucose, UA: NEGATIVE mg/dL
Hgb urine dipstick: NEGATIVE
Ketones, ur: NEGATIVE mg/dL
Leukocytes,Ua: NEGATIVE
Nitrite: NEGATIVE
Protein, ur: 30 mg/dL — AB
Specific Gravity, Urine: >1.046 — ABNORMAL HIGH (ref 1.005–1.030)
pH: 5 (ref 5.0–8.0)

## 2023-10-06 LAB — TROPONIN I (HIGH SENSITIVITY)
Troponin I (High Sensitivity): 496 ng/L (ref <18)
Troponin I (High Sensitivity): 594 ng/L (ref <18)
Troponin I (High Sensitivity): 600 ng/L (ref <18)
Troponin I (High Sensitivity): 602 ng/L (ref <18)

## 2023-10-06 LAB — STREP PNEUMONIAE URINARY ANTIGEN: Strep Pneumo Urinary Antigen: NEGATIVE

## 2023-10-06 LAB — CBC
HCT: 24.7 % — ABNORMAL LOW (ref 36.0–46.0)
HCT: 25.9 % — ABNORMAL LOW (ref 36.0–46.0)
Hemoglobin: 8 g/dL — ABNORMAL LOW (ref 12.0–15.0)
Hemoglobin: 7.9 g/dL — ABNORMAL LOW (ref 12.0–15.0)
MCH: 32 pg (ref 26.0–34.0)
MCH: 31.7 pg (ref 26.0–34.0)
MCHC: 32.4 g/dL (ref 30.0–36.0)
MCHC: 30.5 g/dL (ref 30.0–36.0)
MCV: 98.8 fL (ref 80.0–100.0)
MCV: 104 fL — ABNORMAL HIGH (ref 80.0–100.0)
Platelets: 71 10*3/uL — ABNORMAL LOW (ref 150–400)
Platelets: 76 10*3/uL — ABNORMAL LOW (ref 150–400)
RBC: 2.5 MIL/uL — ABNORMAL LOW (ref 3.87–5.11)
RBC: 2.49 MIL/uL — ABNORMAL LOW (ref 3.87–5.11)
RDW: 17.8 % — ABNORMAL HIGH (ref 11.5–15.5)
RDW: 18.1 % — ABNORMAL HIGH (ref 11.5–15.5)
WBC: 38.6 10*3/uL — ABNORMAL HIGH (ref 4.0–10.5)
WBC: 47.2 10*3/uL — ABNORMAL HIGH (ref 4.0–10.5)
nRBC: 1.4 % — ABNORMAL HIGH (ref 0.0–0.2)
nRBC: 2 % — ABNORMAL HIGH (ref 0.0–0.2)

## 2023-10-06 LAB — BASIC METABOLIC PANEL
Anion gap: 9 (ref 5–15)
BUN: 18 mg/dL (ref 8–23)
CO2: 19 mmol/L — ABNORMAL LOW (ref 22–32)
Calcium: 7.9 mg/dL — ABNORMAL LOW (ref 8.9–10.3)
Chloride: 108 mmol/L (ref 98–111)
Creatinine, Ser: 1.51 mg/dL — ABNORMAL HIGH (ref 0.44–1.00)
GFR, Estimated: 35 mL/min — ABNORMAL LOW (ref >60)
Glucose, Bld: 168 mg/dL — ABNORMAL HIGH (ref 70–99)
Potassium: 3.9 mmol/L (ref 3.5–5.1)
Sodium: 136 mmol/L (ref 135–145)

## 2023-10-06 LAB — BLOOD GAS, ARTERIAL
Acid-base deficit: 6.2 mmol/L — ABNORMAL HIGH (ref 0.0–2.0)
Bicarbonate: 17.9 mmol/L — ABNORMAL LOW (ref 20.0–28.0)
O2 Content: 4 L/min
O2 Saturation: 98.9 %
Patient temperature: 37
pCO2 arterial: 31 mm[Hg] — ABNORMAL LOW (ref 32–48)
pH, Arterial: 7.37 (ref 7.35–7.45)
pO2, Arterial: 122 mm[Hg] — ABNORMAL HIGH (ref 83–108)

## 2023-10-06 LAB — ECHOCARDIOGRAM COMPLETE
AR max vel: 1.87 cm2
AV Area VTI: 1.79 cm2
AV Area mean vel: 1.77 cm2
AV Mean grad: 14 mm[Hg]
AV Peak grad: 29.8 mm[Hg]
Ao pk vel: 2.73 m/s
Area-P 1/2: 3.33 cm2
Height: 62 in
MV VTI: 2.4 cm2
S' Lateral: 2.5 cm
Weight: 2578.5 [oz_av]

## 2023-10-06 LAB — GLUCOSE, CAPILLARY
Glucose-Capillary: 140 mg/dL — ABNORMAL HIGH (ref 70–99)
Glucose-Capillary: 146 mg/dL — ABNORMAL HIGH (ref 70–99)
Glucose-Capillary: 218 mg/dL — ABNORMAL HIGH (ref 70–99)
Glucose-Capillary: 202 mg/dL — ABNORMAL HIGH (ref 70–99)
Glucose-Capillary: 156 mg/dL — ABNORMAL HIGH (ref 70–99)

## 2023-10-06 LAB — RETICULOCYTES
Immature Retic Fract: 37.8 % — ABNORMAL HIGH (ref 2.3–15.9)
RBC.: 2.48 MIL/uL — ABNORMAL LOW (ref 3.87–5.11)
Retic Count, Absolute: 44.6 10*3/uL (ref 19.0–186.0)
Retic Ct Pct: 1.8 % (ref 0.4–3.1)

## 2023-10-06 LAB — IRON AND TIBC
Iron: 38 ug/dL (ref 28–170)
Saturation Ratios: 16 % (ref 10.4–31.8)
TIBC: 242 ug/dL — ABNORMAL LOW (ref 250–450)
UIBC: 204 ug/dL

## 2023-10-06 LAB — LACTATE DEHYDROGENASE: LDH: 270 U/L — ABNORMAL HIGH (ref 98–192)

## 2023-10-06 LAB — CKMB (ARMC ONLY): CK, MB: 4.4 ng/mL (ref 0.5–5.0)

## 2023-10-06 LAB — CK: Total CK: 54 U/L (ref 38–234)

## 2023-10-06 LAB — HEMOGLOBIN A1C
Hgb A1c MFr Bld: 6.3 % — ABNORMAL HIGH (ref 4.8–5.6)
Mean Plasma Glucose: 134.11 mg/dL

## 2023-10-06 LAB — FIBRINOGEN: Fibrinogen: 712 mg/dL — ABNORMAL HIGH (ref 210–475)

## 2023-10-06 LAB — FOLATE: Folate: 23 ng/mL (ref >5.9)

## 2023-10-06 LAB — HEPARIN LEVEL (UNFRACTIONATED): Heparin Unfractionated: >1.1 [IU]/mL — ABNORMAL HIGH (ref 0.30–0.70)

## 2023-10-06 LAB — LIPID PANEL
Cholesterol: 108 mg/dL (ref 0–200)
HDL: 26 mg/dL — ABNORMAL LOW (ref >40)
LDL Cholesterol: 56 mg/dL (ref 0–99)
Total CHOL/HDL Ratio: 4.2 {ratio}
Triglycerides: 131 mg/dL (ref <150)
VLDL: 26 mg/dL (ref 0–40)

## 2023-10-06 LAB — APTT: aPTT: >200 s (ref 24–36)

## 2023-10-06 LAB — PROCALCITONIN: Procalcitonin: 144.56 ng/mL

## 2023-10-06 LAB — FERRITIN: Ferritin: 561 ng/mL — ABNORMAL HIGH (ref 11–307)

## 2023-10-06 LAB — PATHOLOGIST SMEAR REVIEW

## 2023-10-06 LAB — MRSA NEXT GEN BY PCR, NASAL: MRSA by PCR Next Gen: NOT DETECTED

## 2023-10-06 LAB — PHOSPHORUS: Phosphorus: 4.1 mg/dL (ref 2.5–4.6)

## 2023-10-06 LAB — VITAMIN B12: Vitamin B-12: 755 pg/mL (ref 180–914)

## 2023-10-06 MED ORDER — IPRATROPIUM-ALBUTEROL 0.5-2.5 (3) MG/3ML IN SOLN
3.0000 mL | RESPIRATORY_TRACT | Status: DC
  Administered 2023-10-06 – 2023-10-07 (×5): 3 mL via RESPIRATORY_TRACT
  Filled 2023-10-06 (×5): qty 3

## 2023-10-06 MED ORDER — CHLORHEXIDINE GLUCONATE CLOTH 2 % EX PADS: 6.0000 | MEDICATED_PAD | Freq: Every day | CUTANEOUS | Status: DC

## 2023-10-06 MED ORDER — ORAL CARE MOUTH RINSE: 15.0000 mL | OROMUCOSAL | Status: DC | PRN

## 2023-10-06 MED ORDER — DOCUSATE SODIUM 100 MG PO CAPS: 100.0000 mg | ORAL_CAPSULE | Freq: Two times a day (BID) | ORAL | Status: DC

## 2023-10-06 MED ORDER — INSULIN ASPART 100 UNIT/ML IJ SOLN
1.0000 [IU] | INTRAMUSCULAR | Status: DC
  Administered 2023-10-06 (×2): 3 [IU] via SUBCUTANEOUS
  Administered 2023-10-06 – 2023-10-07 (×2): 2 [IU] via SUBCUTANEOUS
  Administered 2023-10-07: 1 [IU] via SUBCUTANEOUS
  Filled 2023-10-06 (×5): qty 1

## 2023-10-06 MED ORDER — MAGNESIUM SULFATE 2 GM/50ML IV SOLN
2.0000 g | Freq: Once | INTRAVENOUS | Status: AC
  Administered 2023-10-06: 2 g via INTRAVENOUS
  Filled 2023-10-06: qty 50

## 2023-10-06 MED ORDER — VANCOMYCIN VARIABLE DOSE PER UNSTABLE RENAL FUNCTION (PHARMACIST DOSING): Status: DC

## 2023-10-06 MED ORDER — HEPARIN BOLUS VIA INFUSION
3900.0000 [IU] | Freq: Once | INTRAVENOUS | Status: AC
  Administered 2023-10-06: 3900 [IU] via INTRAVENOUS
  Filled 2023-10-06: qty 3900

## 2023-10-06 MED ORDER — MORPHINE SULFATE (PF) 2 MG/ML IV SOLN
2.0000 mg | Freq: Once | INTRAVENOUS | Status: AC
  Administered 2023-10-06: 2 mg via INTRAVENOUS
  Filled 2023-10-06: qty 1

## 2023-10-06 MED ORDER — PNEUMOCOCCAL 20-VAL CONJ VACC 0.5 ML IM SUSY: 0.5000 mL | PREFILLED_SYRINGE | INTRAMUSCULAR | Status: DC

## 2023-10-06 MED ORDER — METOPROLOL TARTRATE 5 MG/5ML IV SOLN
2.5000 mg | INTRAVENOUS | Status: DC | PRN
  Administered 2023-10-06: 2.5 mg via INTRAVENOUS
  Filled 2023-10-06 (×2): qty 5

## 2023-10-06 MED ORDER — ORAL CARE MOUTH RINSE
15.0000 mL | OROMUCOSAL | Status: DC
  Administered 2023-10-06 – 2023-10-07 (×4): 15 mL via OROMUCOSAL

## 2023-10-06 MED ORDER — LACTATED RINGERS IV BOLUS
500.0000 mL | Freq: Once | INTRAVENOUS | Status: AC
  Administered 2023-10-06: 500 mL via INTRAVENOUS

## 2023-10-06 MED ORDER — ONDANSETRON HCL 4 MG/2ML IJ SOLN
4.0000 mg | Freq: Four times a day (QID) | INTRAMUSCULAR | Status: DC | PRN
  Administered 2023-10-06: 4 mg via INTRAVENOUS
  Filled 2023-10-06: qty 2

## 2023-10-06 MED ORDER — HEPARIN (PORCINE) 25000 UT/250ML-% IV SOLN
650.0000 [IU]/h | INTRAVENOUS | Status: DC
  Administered 2023-10-06: 850 [IU]/h via INTRAVENOUS
  Filled 2023-10-06: qty 250

## 2023-10-06 MED ORDER — INFLUENZA VAC A&B SURF ANT ADJ 0.5 ML IM SUSY
0.5000 mL | PREFILLED_SYRINGE | INTRAMUSCULAR | Status: DC
  Filled 2023-10-06: qty 0.5

## 2023-10-06 MED ORDER — BUDESONIDE 0.5 MG/2ML IN SUSP
0.5000 mg | Freq: Two times a day (BID) | RESPIRATORY_TRACT | Status: DC
  Administered 2023-10-06 – 2023-10-07 (×3): 0.5 mg via RESPIRATORY_TRACT
  Filled 2023-10-06 (×3): qty 2

## 2023-10-06 MED ORDER — METOPROLOL SUCCINATE ER 50 MG PO TB24
25.0000 mg | ORAL_TABLET | Freq: Every day | ORAL | Status: DC
  Administered 2023-10-06: 25 mg via ORAL
  Filled 2023-10-06 (×2): qty 1

## 2023-10-06 MED ORDER — ALBUTEROL SULFATE (2.5 MG/3ML) 0.083% IN NEBU: 2.5000 mg | INHALATION_SOLUTION | RESPIRATORY_TRACT | Status: DC | PRN

## 2023-10-06 MED ORDER — VANCOMYCIN HCL 1750 MG/350ML IV SOLN
1750.0000 mg | Freq: Once | INTRAVENOUS | Status: AC
  Administered 2023-10-06: 1750 mg via INTRAVENOUS
  Filled 2023-10-06: qty 350

## 2023-10-06 MED ORDER — HYDROXYUREA 500 MG PO CAPS
1000.0000 mg | ORAL_CAPSULE | Freq: Every day | ORAL | Status: DC
  Administered 2023-10-06 – 2023-10-07 (×2): 1000 mg via ORAL
  Filled 2023-10-06 (×2): qty 2

## 2023-10-06 MED ORDER — LACTATED RINGERS IV SOLN: INTRAVENOUS | Status: DC

## 2023-10-06 MED ORDER — METHYLPREDNISOLONE SODIUM SUCC 40 MG IJ SOLR
20.0000 mg | Freq: Two times a day (BID) | INTRAMUSCULAR | Status: DC
  Administered 2023-10-06 (×2): 20 mg via INTRAVENOUS
  Filled 2023-10-06 (×2): qty 1

## 2023-10-06 MED ORDER — NITROGLYCERIN 0.4 MG SL SUBL: 0.4000 mg | SUBLINGUAL_TABLET | SUBLINGUAL | Status: DC | PRN

## 2023-10-06 MED ORDER — ALPRAZOLAM 0.5 MG PO TABS
0.5000 mg | ORAL_TABLET | Freq: Three times a day (TID) | ORAL | Status: DC
  Administered 2023-10-06 – 2023-10-07 (×3): 0.5 mg via ORAL
  Filled 2023-10-06 (×3): qty 1

## 2023-10-06 MED ORDER — MORPHINE SULFATE (PF) 2 MG/ML IV SOLN
2.0000 mg | INTRAVENOUS | Status: DC | PRN
  Administered 2023-10-06 (×4): 2 mg via INTRAVENOUS
  Filled 2023-10-06 (×4): qty 1

## 2023-10-06 NOTE — IPAL (Signed)
  Interdisciplinary Goals of Care Family Meeting   Date carried out: 10/06/2023  Location of the meeting: Bedside  Member's involved:  Physician, Family, PA students    GOALS OF CARE DISCUSSION  The Clinical status was relayed to family in detail- 2 Daughters, Son and Patient  Updated and notified of patients medical condition- Explained to family course of therapy and the modalities   Patient with Progressive multiorgan failure with a very high probablity of a very minimal chance of meaningful recovery despite all aggressive and optimal medical therapy.  PATIENT REMAINS DNR/DNI STATUS  Family understands the situation.  ACTIVE NSTEMI  WITH BLAST CRISIS PROGNOSIS IS VERY POOR Continue Supportive care  Family and patient will decide on medical decisions   Family are satisfied with Plan of action and management. All questions answered  Additional CC time 35 mins   Ellenie Salome Santiago Glad, M.D.  [facility]  Medical Director [facility] Medical Director [facility] Cardio-Pulmonary Department

## 2023-10-06 NOTE — Progress Notes (Signed)
PHARMACY CONSULT NOTE - FOLLOW UP  Pharmacy Consult for Electrolyte Monitoring and Replacement   Recent Labs: Potassium (mmol/L)  Date Value  10/06/2023 3.9  08/13/2014 4.0   Magnesium (mg/dL)  Date Value  71/05/2693 1.8   Calcium (mg/dL)  Date Value  85/46/2703 7.9 (L)   Calcium, Total (mg/dL)  Date Value  50/08/3817 8.6   Albumin (g/dL)  Date Value  29/93/7169 3.0 (L)  08/11/2014 3.5   Phosphorus (mg/dL)  Date Value  67/89/3810 4.1   Sodium (mmol/L)  Date Value  10/06/2023 136  08/13/2014 137   Assessment: 77 year old female admitted with sepsis, being admitted to CCU. Pharmacy has been consulted to manage electrolyte replacement.  Goal of Therapy:  Electrolytes within normal limits  Plan:  Mag 1.8; magnesium sulfate 2 gm IV x 1  No other electrolyte replacement warranted at this time  Follow up electrolytes in AM  Littie Deeds, PharmD Pharmacy Resident  10/06/2023 6:57 AM

## 2023-10-06 NOTE — Consult Note (Signed)
[facility] CLINIC CARDIOLOGY CONSULT NOTE       Patient ID: Sara Mcdonald MRN: [id number] DOB/AGE: 77/24/1947 77 y.o.  Admit date: 10/05/2023 Referring Physician Dr. Erin Fulling Primary Physician Danella Penton, MD Primary Cardiologist Dr. Marcina Millard Reason for Consultation "Possible NSTEMI"  HPI: Sara Mcdonald is a 77 y.o. female  with a past medical history of paroxysmal atrial fibrillation, HFpEF (EF 65-70% with CHAD2DS2-VASc score of 5, Eliquis), HTN, HLD, T2DM, OSA who presented to the ED on 10/05/2023 for shortness of breath and chest pain. Cardiology was consulted for further evaluation.   Patient was seen in the ED on 10/17 for shortness of breath that has been occurring for past 3 days and chest pain for 1 day. Patient had associated fever/chills and productive cough with yellow sputum. Upon arrival to ED via EMS patient was hypoxic with SpO2 of 80%. Work up in the ED notable for CTA showed no evidence of pulmonary embolism. CXR showed mild central congestion and probable small pleural effusions. Na 134, K 3.3, Cr 1.48, Mg 1.7, GFR 36, BNP 505.4, LDH 258, Lactic 1.3, Procal 6.6, WBC 24.7, Hgb 7.9, Platelets 59. Peripheral smear shows circulating blasts. Fibrinogen 604. Troponin trended flat 36 > 155 > 445 > 496 > 600 > 602 > 594. EKG with atrial fibrillation at 163 bpm in the ED and patient received 500 mL bolus which converted her back into NSR. Patient received levophed for hypotension and started azithromycin and ceftriaxone.   At the time of my evaluation this morning, patient was laying in her hospital bed in acute distress with family at bedside. Patient had HHFNC with 4L due to shortness of breath. Patient had a difficult time speaking due to her shortness of breath.  Review of systems complete and found to be negative unless listed above    Past Medical History:  Diagnosis Date   Anxiety    Chronic kidney disease    CKD3   Depression    Diabetes mellitus  without complication (HCC)    Dyspnea    with episodes of pleurisy   Elevated lipids    Fibromyalgia    GERD (gastroesophageal reflux disease)    Headache    Hypertension    OA (osteoarthritis)    Pneumonia    has had several times. last time 2019   Sleep apnea     Past Surgical History:  Procedure Laterality Date   APPENDECTOMY     BIOPSY  07/29/2022   Procedure: BIOPSY;  Surgeon: Shellia Cleverly, DO;  Location: [facility] ENDOSCOPY;  Service: Gastroenterology;;   CHOLECYSTECTOMY     COLONOSCOPY WITH PROPOFOL N/A 07/07/2016   Procedure: COLONOSCOPY WITH PROPOFOL;  Surgeon: Scot Jun, MD;  Location: [facility] ENDOSCOPY;  Service: Endoscopy;  Laterality: N/A;   ESOPHAGOGASTRODUODENOSCOPY     EYE SURGERY  2013   bilateral cataract   FLEXIBLE SIGMOIDOSCOPY     FLEXIBLE SIGMOIDOSCOPY N/A 07/29/2022   Procedure: FLEXIBLE SIGMOIDOSCOPY;  Surgeon: Shellia Cleverly, DO;  Location: [facility] ENDOSCOPY;  Service: Gastroenterology;  Laterality: N/A;   nanoflex Bilateral    POLYPECTOMY  07/29/2022   Procedure: POLYPECTOMY;  Surgeon: Shellia Cleverly, DO;  Location: [facility] ENDOSCOPY;  Service: Gastroenterology;;   TOTAL VAGINAL HYSTERECTOMY  1976    Medications Prior to Admission  Medication Sig Dispense Refill Last Dose   acetaminophen (TYLENOL) 500 MG tablet Take 2 tablets (1,000 mg total) by mouth 3 (three) times daily. 30 tablet 0    ALPRAZolam (XANAX) 0.5  [facility] CLINIC CARDIOLOGY CONSULT NOTE       Patient ID: Sara Mcdonald MRN: [id number] DOB/AGE: 77/24/1947 77 y.o.  Admit date: 10/05/2023 Referring Physician Dr. Erin Fulling Primary Physician Danella Penton, MD Primary Cardiologist Dr. Marcina Millard Reason for Consultation "Possible NSTEMI"  HPI: Sara Mcdonald is a 77 y.o. female  with a past medical history of paroxysmal atrial fibrillation, HFpEF (EF 65-70% with CHAD2DS2-VASc score of 5, Eliquis), HTN, HLD, T2DM, OSA who presented to the ED on 10/05/2023 for shortness of breath and chest pain. Cardiology was consulted for further evaluation.   Patient was seen in the ED on 10/17 for shortness of breath that has been occurring for past 3 days and chest pain for 1 day. Patient had associated fever/chills and productive cough with yellow sputum. Upon arrival to ED via EMS patient was hypoxic with SpO2 of 80%. Work up in the ED notable for CTA showed no evidence of pulmonary embolism. CXR showed mild central congestion and probable small pleural effusions. Na 134, K 3.3, Cr 1.48, Mg 1.7, GFR 36, BNP 505.4, LDH 258, Lactic 1.3, Procal 6.6, WBC 24.7, Hgb 7.9, Platelets 59. Peripheral smear shows circulating blasts. Fibrinogen 604. Troponin trended flat 36 > 155 > 445 > 496 > 600 > 602 > 594. EKG with atrial fibrillation at 163 bpm in the ED and patient received 500 mL bolus which converted her back into NSR. Patient received levophed for hypotension and started azithromycin and ceftriaxone.   At the time of my evaluation this morning, patient was laying in her hospital bed in acute distress with family at bedside. Patient had HHFNC with 4L due to shortness of breath. Patient had a difficult time speaking due to her shortness of breath.  Review of systems complete and found to be negative unless listed above    Past Medical History:  Diagnosis Date   Anxiety    Chronic kidney disease    CKD3   Depression    Diabetes mellitus  without complication (HCC)    Dyspnea    with episodes of pleurisy   Elevated lipids    Fibromyalgia    GERD (gastroesophageal reflux disease)    Headache    Hypertension    OA (osteoarthritis)    Pneumonia    has had several times. last time 2019   Sleep apnea     Past Surgical History:  Procedure Laterality Date   APPENDECTOMY     BIOPSY  07/29/2022   Procedure: BIOPSY;  Surgeon: Shellia Cleverly, DO;  Location: [facility]OSCOPY;  Service: Gastroenterology;;   CHOLECYSTECTOMY     COLONOSCOPY WITH PROPOFOL N/A 07/07/2016   Procedure: COLONOSCOPY WITH PROPOFOL;  Surgeon: Scot Jun, MD;  Location: [facility] ENDOSCOPY;  Service: Endoscopy;  Laterality: N/A;   ESOPHAGOGASTRODUODENOSCOPY     EYE SURGERY  2013   bilateral cataract   FLEXIBLE SIGMOIDOSCOPY     FLEXIBLE SIGMOIDOSCOPY N/A 07/29/2022   Procedure: FLEXIBLE SIGMOIDOSCOPY;  Surgeon: Shellia Cleverly, DO;  Location: [facility] ENDOSCOPY;  Service: Gastroenterology;  Laterality: N/A;   nanoflex Bilateral    POLYPECTOMY  07/29/2022   Procedure: POLYPECTOMY;  Surgeon: Shellia Cleverly, DO;  Location: [facility]OSCOPY;  Service: Gastroenterology;;   TOTAL VAGINAL HYSTERECTOMY  1976    Medications Prior to Admission  Medication Sig Dispense Refill Last Dose   acetaminophen (TYLENOL) 500 MG tablet Take 2 tablets (1,000 mg total) by mouth 3 (three) times daily. 30 tablet 0    ALPRAZolam (XANAX) 0.5  Labs    10/06/23 0333  VITAMINB12 755  FOLATE 23.0  FERRITIN 561*  TIBC 242*  IRON 38  RETICCTPCT 1.8     Radiology: ECHOCARDIOGRAM COMPLETE  Result Date: 10/06/2023    ECHOCARDIOGRAM REPORT   Patient Name:   Sara Mcdonald Date of Exam: 10/06/2023 Medical Rec #:  [id number]        Height:       62.0 in Accession #:    [id number]       Weight:       161.2 lb Date of Birth:  1946-05-07        BSA:          1.744 m Patient Age:    77 years         BP:           136/59 mmHg Patient Gender: F                HR:           119 bpm. Exam  Location:  [facility] Procedure: 2D Echo, Cardiac Doppler and Color Doppler Indications:     Elevated Troponin  History:         Patient has prior history of Echocardiogram examinations, most                  recent 07/30/2022. COPD, Arrythmias:Tachycardia; Risk                  Factors:Hypertension, Diabetes and Sleep Apnea.  Sonographer:     Mikki Harbor Referring Phys:  [id number] BRITTON L RUST-CHESTER Diagnosing Phys: Rozell Searing Gaelle Adriance IMPRESSIONS  1. Left ventricular ejection fraction, by estimation, is 65 to 70%. Left ventricular ejection fraction by PLAX is 73 %. The left ventricle has normal function. The left ventricle has no regional wall motion abnormalities. Left ventricular diastolic parameters are consistent with Grade II diastolic dysfunction (pseudonormalization).  2. Right ventricular systolic function is normal. The right ventricular size is mildly enlarged. There is severely elevated pulmonary artery systolic pressure. The estimated right ventricular systolic pressure is 67.3 mmHg.  3. Given severely elevated pulmonary pressures, cardiac tamponade may be masked in this patient. Moderate pericardial effusion. The pericardial effusion is circumferential.  4. The mitral valve is grossly normal. Trivial mitral valve regurgitation.  5. The aortic valve is grossly normal. Aortic valve regurgitation is not visualized. No aortic stenosis is present. FINDINGS  Left Ventricle: Left ventricular ejection fraction, by estimation, is 65 to 70%. Left ventricular ejection fraction by PLAX is 73 %. The left ventricle has normal function. The left ventricle has no regional wall motion abnormalities. The left ventricular internal cavity size was normal in size. There is no left ventricular hypertrophy. Left ventricular diastolic parameters are consistent with Grade II diastolic dysfunction (pseudonormalization). Right Ventricle: The right ventricular size is mildly enlarged. No increase in right ventricular wall thickness.  Right ventricular systolic function is normal. There is severely elevated pulmonary artery systolic pressure. The tricuspid regurgitant velocity is 3.85 m/s, and with an assumed right atrial pressure of 8 mmHg, the estimated right ventricular systolic pressure is 67.3 mmHg. Left Atrium: Left atrial size was normal in size. Right Atrium: Right atrial size was normal in size. Pericardium: Given severely elevated pulmonary pressures, cardiac tamponade may be masked in this patient. A moderately sized pericardial effusion is present. The pericardial effusion is circumferential. There is excessive respiratory variation in the mitral valve spectral Doppler velocities. Mitral Valve: The mitral valve is grossly  Labs    10/06/23 0333  VITAMINB12 755  FOLATE 23.0  FERRITIN 561*  TIBC 242*  IRON 38  RETICCTPCT 1.8     Radiology: ECHOCARDIOGRAM COMPLETE  Result Date: 10/06/2023    ECHOCARDIOGRAM REPORT   Patient Name:   Sara Mcdonald Date of Exam: 10/06/2023 Medical Rec #:  [id number]        Height:       62.0 in Accession #:    [id number]       Weight:       161.2 lb Date of Birth:  1946-05-07        BSA:          1.744 m Patient Age:    77 years         BP:           136/59 mmHg Patient Gender: F                HR:           119 bpm. Exam  Location:  [facility] Procedure: 2D Echo, Cardiac Doppler and Color Doppler Indications:     Elevated Troponin  History:         Patient has prior history of Echocardiogram examinations, most                  recent 07/30/2022. COPD, Arrythmias:Tachycardia; Risk                  Factors:Hypertension, Diabetes and Sleep Apnea.  Sonographer:     Mikki Harbor Referring Phys:  [id number] BRITTON L RUST-CHESTER Diagnosing Phys: Rozell Searing Gaelle Adriance IMPRESSIONS  1. Left ventricular ejection fraction, by estimation, is 65 to 70%. Left ventricular ejection fraction by PLAX is 73 %. The left ventricle has normal function. The left ventricle has no regional wall motion abnormalities. Left ventricular diastolic parameters are consistent with Grade II diastolic dysfunction (pseudonormalization).  2. Right ventricular systolic function is normal. The right ventricular size is mildly enlarged. There is severely elevated pulmonary artery systolic pressure. The estimated right ventricular systolic pressure is 67.3 mmHg.  3. Given severely elevated pulmonary pressures, cardiac tamponade may be masked in this patient. Moderate pericardial effusion. The pericardial effusion is circumferential.  4. The mitral valve is grossly normal. Trivial mitral valve regurgitation.  5. The aortic valve is grossly normal. Aortic valve regurgitation is not visualized. No aortic stenosis is present. FINDINGS  Left Ventricle: Left ventricular ejection fraction, by estimation, is 65 to 70%. Left ventricular ejection fraction by PLAX is 73 %. The left ventricle has normal function. The left ventricle has no regional wall motion abnormalities. The left ventricular internal cavity size was normal in size. There is no left ventricular hypertrophy. Left ventricular diastolic parameters are consistent with Grade II diastolic dysfunction (pseudonormalization). Right Ventricle: The right ventricular size is mildly enlarged. No increase in right ventricular wall thickness.  Right ventricular systolic function is normal. There is severely elevated pulmonary artery systolic pressure. The tricuspid regurgitant velocity is 3.85 m/s, and with an assumed right atrial pressure of 8 mmHg, the estimated right ventricular systolic pressure is 67.3 mmHg. Left Atrium: Left atrial size was normal in size. Right Atrium: Right atrial size was normal in size. Pericardium: Given severely elevated pulmonary pressures, cardiac tamponade may be masked in this patient. A moderately sized pericardial effusion is present. The pericardial effusion is circumferential. There is excessive respiratory variation in the mitral valve spectral Doppler velocities. Mitral Valve: The mitral valve is grossly  [facility] CLINIC CARDIOLOGY CONSULT NOTE       Patient ID: Sara Mcdonald MRN: [id number] DOB/AGE: 77/24/1947 77 y.o.  Admit date: 10/05/2023 Referring Physician Dr. Erin Fulling Primary Physician Danella Penton, MD Primary Cardiologist Dr. Marcina Millard Reason for Consultation "Possible NSTEMI"  HPI: Sara Mcdonald is a 77 y.o. female  with a past medical history of paroxysmal atrial fibrillation, HFpEF (EF 65-70% with CHAD2DS2-VASc score of 5, Eliquis), HTN, HLD, T2DM, OSA who presented to the ED on 10/05/2023 for shortness of breath and chest pain. Cardiology was consulted for further evaluation.   Patient was seen in the ED on 10/17 for shortness of breath that has been occurring for past 3 days and chest pain for 1 day. Patient had associated fever/chills and productive cough with yellow sputum. Upon arrival to ED via EMS patient was hypoxic with SpO2 of 80%. Work up in the ED notable for CTA showed no evidence of pulmonary embolism. CXR showed mild central congestion and probable small pleural effusions. Na 134, K 3.3, Cr 1.48, Mg 1.7, GFR 36, BNP 505.4, LDH 258, Lactic 1.3, Procal 6.6, WBC 24.7, Hgb 7.9, Platelets 59. Peripheral smear shows circulating blasts. Fibrinogen 604. Troponin trended flat 36 > 155 > 445 > 496 > 600 > 602 > 594. EKG with atrial fibrillation at 163 bpm in the ED and patient received 500 mL bolus which converted her back into NSR. Patient received levophed for hypotension and started azithromycin and ceftriaxone.   At the time of my evaluation this morning, patient was laying in her hospital bed in acute distress with family at bedside. Patient had HHFNC with 4L due to shortness of breath. Patient had a difficult time speaking due to her shortness of breath.  Review of systems complete and found to be negative unless listed above    Past Medical History:  Diagnosis Date   Anxiety    Chronic kidney disease    CKD3   Depression    Diabetes mellitus  without complication (HCC)    Dyspnea    with episodes of pleurisy   Elevated lipids    Fibromyalgia    GERD (gastroesophageal reflux disease)    Headache    Hypertension    OA (osteoarthritis)    Pneumonia    has had several times. last time 2019   Sleep apnea     Past Surgical History:  Procedure Laterality Date   APPENDECTOMY     BIOPSY  07/29/2022   Procedure: BIOPSY;  Surgeon: Shellia Cleverly, DO;  Location: [facility] ENDOSCOPY;  Service: Gastroenterology;;   CHOLECYSTECTOMY     COLONOSCOPY WITH PROPOFOL N/A 07/07/2016   Procedure: COLONOSCOPY WITH PROPOFOL;  Surgeon: Scot Jun, MD;  Location: [facility] ENDOSCOPY;  Service: Endoscopy;  Laterality: N/A;   ESOPHAGOGASTRODUODENOSCOPY     EYE SURGERY  2013   bilateral cataract   FLEXIBLE SIGMOIDOSCOPY     FLEXIBLE SIGMOIDOSCOPY N/A 07/29/2022   Procedure: FLEXIBLE SIGMOIDOSCOPY;  Surgeon: Shellia Cleverly, DO;  Location: [facility] ENDOSCOPY;  Service: Gastroenterology;  Laterality: N/A;   nanoflex Bilateral    POLYPECTOMY  07/29/2022   Procedure: POLYPECTOMY;  Surgeon: Shellia Cleverly, DO;  Location: [facility]OS[facility];  Service: Gastroenterology;;   TOTAL VAGINAL HYSTERECTOMY  1976    Medications Prior to Admission  Medication Sig Dispense Refill Last Dose   acetaminophen (TYLENOL) 500 MG tablet Take 2 tablets (1,000 mg total) by mouth 3 (three) times daily. 30 tablet 0    ALPRAZolam (XANAX) 0.5  [facility] CLINIC CARDIOLOGY CONSULT NOTE       Patient ID: Sara Mcdonald MRN: [id number] DOB/AGE: 77/24/1947 77 y.o.  Admit date: 10/05/2023 Referring Physician Dr. Erin Fulling Primary Physician Danella Penton, MD Primary Cardiologist Dr. Marcina Millard Reason for Consultation "Possible NSTEMI"  HPI: Sara Mcdonald is a 77 y.o. female  with a past medical history of paroxysmal atrial fibrillation, HFpEF (EF 65-70% with CHAD2DS2-VASc score of 5, Eliquis), HTN, HLD, T2DM, OSA who presented to the ED on 10/05/2023 for shortness of breath and chest pain. Cardiology was consulted for further evaluation.   Patient was seen in the ED on 10/17 for shortness of breath that has been occurring for past 3 days and chest pain for 1 day. Patient had associated fever/chills and productive cough with yellow sputum. Upon arrival to ED via EMS patient was hypoxic with SpO2 of 80%. Work up in the ED notable for CTA showed no evidence of pulmonary embolism. CXR showed mild central congestion and probable small pleural effusions. Na 134, K 3.3, Cr 1.48, Mg 1.7, GFR 36, BNP 505.4, LDH 258, Lactic 1.3, Procal 6.6, WBC 24.7, Hgb 7.9, Platelets 59. Peripheral smear shows circulating blasts. Fibrinogen 604. Troponin trended flat 36 > 155 > 445 > 496 > 600 > 602 > 594. EKG with atrial fibrillation at 163 bpm in the ED and patient received 500 mL bolus which converted her back into NSR. Patient received levophed for hypotension and started azithromycin and ceftriaxone.   At the time of my evaluation this morning, patient was laying in her hospital bed in acute distress with family at bedside. Patient had HHFNC with 4L due to shortness of breath. Patient had a difficult time speaking due to her shortness of breath.  Review of systems complete and found to be negative unless listed above    Past Medical History:  Diagnosis Date   Anxiety    Chronic kidney disease    CKD3   Depression    Diabetes mellitus  without complication (HCC)    Dyspnea    with episodes of pleurisy   Elevated lipids    Fibromyalgia    GERD (gastroesophageal reflux disease)    Headache    Hypertension    OA (osteoarthritis)    Pneumonia    has had several times. last time 2019   Sleep apnea     Past Surgical History:  Procedure Laterality Date   APPENDECTOMY     BIOPSY  07/29/2022   Procedure: BIOPSY;  Surgeon: Shellia Cleverly, DO;  Location: [facility] ENDOSCOPY;  Service: Gastroenterology;;   CHOLECYSTECTOMY     COLONOSCOPY WITH PROPOFOL N/A 07/07/2016   Procedure: COLONOSCOPY WITH PROPOFOL;  Surgeon: Scot Jun, MD;  Location: [facility] ENDOSCOPY;  Service: Endoscopy;  Laterality: N/A;   ESOPHAGOGASTRODUODENOSCOPY     EYE SURGERY  2013   bilateral cataract   FLEXIBLE SIGMOIDOSCOPY     FLEXIBLE SIGMOIDOSCOPY N/A 07/29/2022   Procedure: FLEXIBLE SIGMOIDOSCOPY;  Surgeon: Shellia Cleverly, DO;  Location: [facility] ENDOSCOPY;  Service: Gastroenterology;  Laterality: N/A;   nanoflex Bilateral    POLYPECTOMY  07/29/2022   Procedure: POLYPECTOMY;  Surgeon: Shellia Cleverly, DO;  Location: [facility] ENDOSCOPY;  Service: Gastroenterology;;   TOTAL VAGINAL HYSTERECTOMY  1976    Medications Prior to Admission  Medication Sig Dispense Refill Last Dose   acetaminophen (TYLENOL) 500 MG tablet Take 2 tablets (1,000 mg total) by mouth 3 (three) times daily. 30 tablet 0    ALPRAZolam (XANAX) 0.5  Labs    10/06/23 0333  VITAMINB12 755  FOLATE 23.0  FERRITIN 561*  TIBC 242*  IRON 38  RETICCTPCT 1.8     Radiology: ECHOCARDIOGRAM COMPLETE  Result Date: 10/06/2023    ECHOCARDIOGRAM REPORT   Patient Name:   Sara Mcdonald Date of Exam: 10/06/2023 Medical Rec #:  [id number]        Height:       62.0 in Accession #:    [id number]       Weight:       161.2 lb Date of Birth:  1946-05-07        BSA:          1.744 m Patient Age:    77 years         BP:           136/59 mmHg Patient Gender: F                HR:           119 bpm. Exam  Location:  [facility] Procedure: 2D Echo, Cardiac Doppler and Color Doppler Indications:     Elevated Troponin  History:         Patient has prior history of Echocardiogram examinations, most                  recent 07/30/2022. COPD, Arrythmias:Tachycardia; Risk                  Factors:Hypertension, Diabetes and Sleep Apnea.  Sonographer:     Mikki Harbor Referring Phys:  [id number] BRITTON L RUST-[place]STER Diagnosing Phys: Rozell Searing Gaelle Adriance IMPRESSIONS  1. Left ventricular ejection fraction, by estimation, is 65 to 70%. Left ventricular ejection fraction by PLAX is 73 %. The left ventricle has normal function. The left ventricle has no regional wall motion abnormalities. Left ventricular diastolic parameters are consistent with Grade II diastolic dysfunction (pseudonormalization).  2. Right ventricular systolic function is normal. The right ventricular size is mildly enlarged. There is severely elevated pulmonary artery systolic pressure. The estimated right ventricular systolic pressure is 67.3 mmHg.  3. Given severely elevated pulmonary pressures, cardiac tamponade may be masked in this patient. Moderate pericardial effusion. The pericardial effusion is circumferential.  4. The mitral valve is grossly normal. Trivial mitral valve regurgitation.  5. The aortic valve is grossly normal. Aortic valve regurgitation is not visualized. No aortic stenosis is present. FINDINGS  Left Ventricle: Left ventricular ejection fraction, by estimation, is 65 to 70%. Left ventricular ejection fraction by PLAX is 73 %. The left ventricle has normal function. The left ventricle has no regional wall motion abnormalities. The left ventricular internal cavity size was normal in size. There is no left ventricular hypertrophy. Left ventricular diastolic parameters are consistent with Grade II diastolic dysfunction (pseudonormalization). Right Ventricle: The right ventricular size is mildly enlarged. No increase in right ventricular wall thickness.  Right ventricular systolic function is normal. There is severely elevated pulmonary artery systolic pressure. The tricuspid regurgitant velocity is 3.85 m/s, and with an assumed right atrial pressure of 8 mmHg, the estimated right ventricular systolic pressure is 67.3 mmHg. Left Atrium: Left atrial size was normal in size. Right Atrium: Right atrial size was normal in size. Pericardium: Given severely elevated pulmonary pressures, cardiac tamponade may be masked in this patient. A moderately sized pericardial effusion is present. The pericardial effusion is circumferential. There is excessive respiratory variation in the mitral valve spectral Doppler velocities. Mitral Valve: The mitral valve is grossly  [facility] CLINIC CARDIOLOGY CONSULT NOTE       Patient ID: Sara Mcdonald MRN: [id number] DOB/AGE: 77/24/1947 77 y.o.  Admit date: 10/05/2023 Referring Physician Dr. Erin Fulling Primary Physician Danella Penton, MD Primary Cardiologist Dr. Marcina Millard Reason for Consultation "Possible NSTEMI"  HPI: Sara Mcdonald is a 77 y.o. female  with a past medical history of paroxysmal atrial fibrillation, HFpEF (EF 65-70% with CHAD2DS2-VASc score of 5, Eliquis), HTN, HLD, T2DM, OSA who presented to the ED on 10/05/2023 for shortness of breath and chest pain. Cardiology was consulted for further evaluation.   Patient was seen in the ED on 10/17 for shortness of breath that has been occurring for past 3 days and chest pain for 1 day. Patient had associated fever/chills and productive cough with yellow sputum. Upon arrival to ED via EMS patient was hypoxic with SpO2 of 80%. Work up in the ED notable for CTA showed no evidence of pulmonary embolism. CXR showed mild central congestion and probable small pleural effusions. Na 134, K 3.3, Cr 1.48, Mg 1.7, GFR 36, BNP 505.4, LDH 258, Lactic 1.3, Procal 6.6, WBC 24.7, Hgb 7.9, Platelets 59. Peripheral smear shows circulating blasts. Fibrinogen 604. Troponin trended flat 36 > 155 > 445 > 496 > 600 > 602 > 594. EKG with atrial fibrillation at 163 bpm in the ED and patient received 500 mL bolus which converted her back into NSR. Patient received levophed for hypotension and started azithromycin and ceftriaxone.   At the time of my evaluation this morning, patient was laying in her hospital bed in acute distress with family at bedside. Patient had HHFNC with 4L due to shortness of breath. Patient had a difficult time speaking due to her shortness of breath.  Review of systems complete and found to be negative unless listed above    Past Medical History:  Diagnosis Date   Anxiety    Chronic kidney disease    CKD3   Depression    Diabetes mellitus  without complication (HCC)    Dyspnea    with episodes of pleurisy   Elevated lipids    Fibromyalgia    GERD (gastroesophageal reflux disease)    Headache    Hypertension    OA (osteoarthritis)    Pneumonia    has had several times. last time 2019   Sleep apnea     Past Surgical History:  Procedure Laterality Date   APPENDECTOMY     BIOPSY  07/29/2022   Procedure: BIOPSY;  Surgeon: Shellia Cleverly, DO;  Location: [facility] ENDOSCOPY;  Service: Gastroenterology;;   CHOLECYSTECTOMY     COLONOSCOPY WITH PROPOFOL N/A 07/07/2016   Procedure: COLONOSCOPY WITH PROPOFOL;  Surgeon: Scot Jun, MD;  Location: [facility] ENDOSCOPY;  Service: Endoscopy;  Laterality: N/A;   ESOPHAGOGASTRODUODENOSCOPY     EYE SURGERY  2013   bilateral cataract   FLEXIBLE SIGMOIDOSCOPY     FLEXIBLE SIGMOIDOSCOPY N/A 07/29/2022   Procedure: FLEXIBLE SIGMOIDOSCOPY;  Surgeon: Shellia Cleverly, DO;  Location: [facility] ENDOSCOPY;  Service: Gastroenterology;  Laterality: N/A;   nanoflex Bilateral    POLYPECTOMY  07/29/2022   Procedure: POLYPECTOMY;  Surgeon: Shellia Cleverly, DO;  Location: [facility] ENDOSCOPY;  Service: Gastroenterology;;   TOTAL VAGINAL HYSTERECTOMY  1976    Medications Prior to Admission  Medication Sig Dispense Refill Last Dose   acetaminophen (TYLENOL) 500 MG tablet Take 2 tablets (1,000 mg total) by mouth 3 (three) times daily. 30 tablet 0    ALPRAZolam (XANAX) 0.5

## 2023-10-06 NOTE — Plan of Care (Signed)
  Problem: Education: Goal: Understanding of post-operative needs will improve Outcome: Progressing   Problem: Respiratory: Goal: Will regain and/or maintain adequate ventilation Outcome: Progressing   Problem: Education: Goal: Knowledge of General Education information will improve Description: Including pain rating scale, medication(s)/side effects and non-pharmacologic comfort measures Outcome: Progressing   Problem: Health Behavior/Discharge Planning: Goal: Ability to manage health-related needs will improve Outcome: Progressing   Problem: Clinical Measurements: Goal: Ability to maintain clinical measurements within normal limits will improve Outcome: Progressing Goal: Will remain free from infection Outcome: Progressing Goal: Diagnostic test results will improve Outcome: Progressing Goal: Cardiovascular complication will be avoided Outcome: Progressing   Problem: Activity: Goal: Risk for activity intolerance will decrease Outcome: Progressing   Problem: Nutrition: Goal: Adequate nutrition will be maintained Outcome: Progressing   Problem: Coping: Goal: Level of anxiety will decrease Outcome: Progressing   Problem: Elimination: Goal: Will not experience complications related to urinary retention Outcome: Progressing

## 2023-10-06 NOTE — Progress Notes (Signed)
ANTICOAGULATION CONSULT NOTE  Pharmacy Consult for heparin infusion Indication: nSTEMI  Allergies  Allergen Reactions   Elemental Sulfur Rash   Sulfa Antibiotics Rash   Tricor [Fenofibrate] Rash    Patient Measurements: Height: 5\' 2"  (157.5 cm) Weight: 73.1 kg (161 lb 2.5 oz) IBW/kg (Calculated) : 50.1 Heparin Dosing Weight: 65.3 kg  Vital Signs: Temp: 98.5 F (36.9 C) (10/18 0400) Temp Source: Oral (10/18 0400) BP: 116/82 (10/18 0200) Pulse Rate: 120 (10/18 0200)  Labs: Recent Labs    10/05/23 1727 10/05/23 2000 10/05/23 2225 10/06/23 0012 10/06/23 0333  HGB 7.9*  --   --   --  8.0*  HCT 25.0*  --   --   --  24.7*  PLT 59*  --   --   --  71*  APTT 56*  --   --   --   --   LABPROT 23.6*  --   --   --   --   INR 2.1*  --   --   --   --   CREATININE 1.48*  --   --   --  1.51*  TROPONINIHS 36*   < > 445* 496* 600*   < > = values in this interval not displayed.    Estimated Creatinine Clearance: 29.2 mL/min (A) (by C-G formula based on SCr of 1.51 mg/dL (H)).   Medical History: Past Medical History:  Diagnosis Date   Anxiety    Chronic kidney disease    CKD3   Depression    Diabetes mellitus without complication (HCC)    Dyspnea    with episodes of pleurisy   Elevated lipids    Fibromyalgia    GERD (gastroesophageal reflux disease)    Headache    Hypertension    OA (osteoarthritis)    Pneumonia    has had several times. last time 2019   Sleep apnea     Medications:  PTA Meds: Apixaban 5 mg BID, last down unknown  Assessment: Pt is a 77 yo female presenting to ED with hx of A. Fib on Eliquis, now with elevated troponin I level, trending up.  Both baseline INR and aPTT elevated.  Goal of Therapy:  Heparin level 0.3-0.7 units/ml aPTT 66-102 seconds Monitor platelets by anticoagulation protocol: Yes   Plan:  Bolus 3900 units x 1 Start heparin infusion at 850 units/hr Will follow aPTT until correlation w/ HL confirmed Will check aPTT and HL in  8 hr after start of infusion HL & CBC daily while on heparin  Otelia Sergeant, PharmD, [facility] 10/06/2023 5:21 AM

## 2023-10-06 NOTE — Consult Note (Signed)
62.0 in Accession #:    [id number]       Weight:       161.2 lb Date of Birth:  1946-04-11        BSA:          1.744 m Patient Age:    77 years         BP:           136/59 mmHg Patient Gender: F                 HR:           119 bpm. Exam Location:  [facility] Procedure: 2D Echo, Cardiac Doppler and Color Doppler Indications:     Elevated Troponin  History:         Patient has prior history of Echocardiogram examinations, most                  recent 07/30/2022. COPD, Arrythmias:Tachycardia; Risk                  Factors:Hypertension, Diabetes and Sleep Apnea.  Sonographer:     Mikki Harbor Referring Phys:  [id number] BRITTON L RUST-CHESTER Diagnosing Phys: Rozell Searing Custovic IMPRESSIONS  1. Left ventricular ejection fraction, by estimation, is 65 to 70%. Left ventricular ejection fraction by PLAX is 73 %. The left ventricle has normal function. The left ventricle has no regional wall motion abnormalities. Left ventricular diastolic parameters are consistent with Grade II diastolic dysfunction (pseudonormalization).  2. Right ventricular systolic function is normal. The right ventricular size is mildly enlarged. There is severely elevated pulmonary artery systolic pressure. The estimated right ventricular systolic pressure is 67.3 mmHg.  3. Given severely elevated pulmonary pressures, cardiac tamponade may be masked in this patient. Moderate pericardial effusion. The pericardial effusion is circumferential.  4. The mitral valve is grossly normal. Trivial mitral valve regurgitation.  5. The aortic valve is grossly normal. Aortic valve regurgitation is not visualized. No aortic stenosis is present. FINDINGS  Left Ventricle: Left ventricular ejection fraction, by estimation, is 65 to 70%. Left ventricular ejection fraction by PLAX is 73 %. The left ventricle has normal function. The left ventricle has no regional wall motion abnormalities. The left ventricular internal cavity size was normal in size. There is no left ventricular hypertrophy. Left ventricular diastolic parameters are consistent with Grade II diastolic dysfunction (pseudonormalization). Right Ventricle: The right ventricular size is mildly enlarged. No increase in  right ventricular wall thickness. Right ventricular systolic function is normal. There is severely elevated pulmonary artery systolic pressure. The tricuspid regurgitant velocity is 3.85 m/s, and with an assumed right atrial pressure of 8 mmHg, the estimated right ventricular systolic pressure is 67.3 mmHg. Left Atrium: Left atrial size was normal in size. Right Atrium: Right atrial size was normal in size. Pericardium: Given severely elevated pulmonary pressures, cardiac tamponade may be masked in this patient. A moderately sized pericardial effusion is present. The pericardial effusion is circumferential. There is excessive respiratory variation in the mitral valve spectral Doppler velocities. Mitral Valve: The mitral valve is grossly normal. Trivial mitral valve regurgitation. MV peak gradient, 9.2 mmHg. The mean mitral valve gradient is 5.0 mmHg. Tricuspid Valve: The tricuspid valve is grossly normal. Tricuspid valve regurgitation is mild. Aortic Valve: The aortic valve is grossly normal. Aortic valve regurgitation is not visualized. No aortic stenosis is present. Aortic valve mean gradient measures 14.0 mmHg. Aortic valve peak gradient measures 29.8 mmHg. Aortic valve area, by VTI measures 1.79 cm.  Hematology/Oncology Consult note [facility] [phone] [phone]  Patient Care Team: Danella Penton, MD as PCP - General (Internal Medicine) Kemper Durie, RN as [facility] Care Management   Name of the patient: Sara Mcdonald  [id number]  04-14-1946    Reason for consult: Concern for acute leukemia based on peripheral blood smear   Requesting physician: Dr. Belia Heman  Date of visit: 10/06/2023    History of presenting illness-patient is a 77 year old female with a past medical history significant for hypertension hyperlipidemia type 2 diabetes obstructive sleep apnea and paroxysmal A-fib who presented to the ER with symptoms of worsening shortness of breath.  She is currently being treated in the ICU for acute hypoxic respiratory failure.  On her labs she was noted to have creatinine of 1.4, elevated BNP.  White cell count was elevated at 24.7 with a hemoglobin of 7.9 and platelets of 59.There was concern for circulating blasts in the peripheral blood.  A year ago patient was noted to have a white count of 10.7 with a normal platelet count and a hemoglobin of 9.4.  ECOG PS- ***  Pain scale- ***   Review of systems- ROS  Allergies  Allergen Reactions   Elemental Sulfur Rash   Sulfa Antibiotics Rash   Tricor [Fenofibrate] Rash    Patient Active Problem List   Diagnosis Date Noted   Shock circulatory (HCC) 10/05/2023   Diverticulosis of colon without hemorrhage    Erythema of colon    Grade II internal hemorrhoids    Adenomatous polyp of transverse colon    Ileus (HCC) 07/24/2022   Spondylolisthesis of lumbar region 07/18/2022   Lumbar spondylosis 11/17/2021   Lumbar degenerative disc disease 11/17/2021   Chronic pain syndrome 11/17/2021   Sinus tachycardia    Generalized abdominal pain    Acute bronchitis    Weakness    Severe sepsis with acute organ dysfunction (HCC) 09/04/2020   Diabetes mellitus without  complication (HCC)    Hypertension associated with diabetes (HCC)    Acute kidney injury superimposed on CKD (HCC)    Acute lower UTI    Anxiety and depression    OSA (obstructive sleep apnea)    Type 2 diabetes mellitus with hyperlipidemia (HCC)    Acute metabolic encephalopathy    Moderate persistent asthma    Sepsis (HCC) 03/17/2017   Community acquired pneumonia 03/17/2017   COPD with acute exacerbation (HCC) 03/17/2017   Hypoxia 03/17/2017   Hyponatremia 03/17/2017   Acute on chronic renal insufficiency 03/17/2017   Pneumonia 03/17/2017   Acute respiratory failure with hypoxia (HCC) 02/01/2017     Past Medical History:  Diagnosis Date   Anxiety    Chronic kidney disease    CKD3   Depression    Diabetes mellitus without complication (HCC)    Dyspnea    with episodes of pleurisy   Elevated lipids    Fibromyalgia    GERD (gastroesophageal reflux disease)    Headache    Hypertension    OA (osteoarthritis)    Pneumonia    has had several times. last time 2019   Sleep apnea      Past Surgical History:  Procedure Laterality Date   APPENDECTOMY     BIOPSY  07/29/2022   Procedure: BIOPSY;  Surgeon: Shellia Cleverly, DO;  Location: [facility];  Service: Gastroenterology;;   CHOLECYSTECTOMY     COLONOSCOPY WITH PROPOFOL N/A 07/07/2016   Procedure: COLONOSCOPY WITH PROPOFOL;  Surgeon: Molly Maduro  62.0 in Accession #:    [id number]       Weight:       161.2 lb Date of Birth:  1946-04-11        BSA:          1.744 m Patient Age:    77 years         BP:           136/59 mmHg Patient Gender: F                 HR:           119 bpm. Exam Location:  [facility] Procedure: 2D Echo, Cardiac Doppler and Color Doppler Indications:     Elevated Troponin  History:         Patient has prior history of Echocardiogram examinations, most                  recent 07/30/2022. COPD, Arrythmias:Tachycardia; Risk                  Factors:Hypertension, Diabetes and Sleep Apnea.  Sonographer:     Mikki Harbor Referring Phys:  [id number] BRITTON L RUST-CHESTER Diagnosing Phys: Rozell Searing Custovic IMPRESSIONS  1. Left ventricular ejection fraction, by estimation, is 65 to 70%. Left ventricular ejection fraction by PLAX is 73 %. The left ventricle has normal function. The left ventricle has no regional wall motion abnormalities. Left ventricular diastolic parameters are consistent with Grade II diastolic dysfunction (pseudonormalization).  2. Right ventricular systolic function is normal. The right ventricular size is mildly enlarged. There is severely elevated pulmonary artery systolic pressure. The estimated right ventricular systolic pressure is 67.3 mmHg.  3. Given severely elevated pulmonary pressures, cardiac tamponade may be masked in this patient. Moderate pericardial effusion. The pericardial effusion is circumferential.  4. The mitral valve is grossly normal. Trivial mitral valve regurgitation.  5. The aortic valve is grossly normal. Aortic valve regurgitation is not visualized. No aortic stenosis is present. FINDINGS  Left Ventricle: Left ventricular ejection fraction, by estimation, is 65 to 70%. Left ventricular ejection fraction by PLAX is 73 %. The left ventricle has normal function. The left ventricle has no regional wall motion abnormalities. The left ventricular internal cavity size was normal in size. There is no left ventricular hypertrophy. Left ventricular diastolic parameters are consistent with Grade II diastolic dysfunction (pseudonormalization). Right Ventricle: The right ventricular size is mildly enlarged. No increase in  right ventricular wall thickness. Right ventricular systolic function is normal. There is severely elevated pulmonary artery systolic pressure. The tricuspid regurgitant velocity is 3.85 m/s, and with an assumed right atrial pressure of 8 mmHg, the estimated right ventricular systolic pressure is 67.3 mmHg. Left Atrium: Left atrial size was normal in size. Right Atrium: Right atrial size was normal in size. Pericardium: Given severely elevated pulmonary pressures, cardiac tamponade may be masked in this patient. A moderately sized pericardial effusion is present. The pericardial effusion is circumferential. There is excessive respiratory variation in the mitral valve spectral Doppler velocities. Mitral Valve: The mitral valve is grossly normal. Trivial mitral valve regurgitation. MV peak gradient, 9.2 mmHg. The mean mitral valve gradient is 5.0 mmHg. Tricuspid Valve: The tricuspid valve is grossly normal. Tricuspid valve regurgitation is mild. Aortic Valve: The aortic valve is grossly normal. Aortic valve regurgitation is not visualized. No aortic stenosis is present. Aortic valve mean gradient measures 14.0 mmHg. Aortic valve peak gradient measures 29.8 mmHg. Aortic valve area, by VTI measures 1.79 cm.  62.0 in Accession #:    [id number]       Weight:       161.2 lb Date of Birth:  1946-04-11        BSA:          1.744 m Patient Age:    77 years         BP:           136/59 mmHg Patient Gender: F                 HR:           119 bpm. Exam Location:  [facility] Procedure: 2D Echo, Cardiac Doppler and Color Doppler Indications:     Elevated Troponin  History:         Patient has prior history of Echocardiogram examinations, most                  recent 07/30/2022. COPD, Arrythmias:Tachycardia; Risk                  Factors:Hypertension, Diabetes and Sleep Apnea.  Sonographer:     Mikki Harbor Referring Phys:  [id number] BRITTON L RUST-CHESTER Diagnosing Phys: Rozell Searing Custovic IMPRESSIONS  1. Left ventricular ejection fraction, by estimation, is 65 to 70%. Left ventricular ejection fraction by PLAX is 73 %. The left ventricle has normal function. The left ventricle has no regional wall motion abnormalities. Left ventricular diastolic parameters are consistent with Grade II diastolic dysfunction (pseudonormalization).  2. Right ventricular systolic function is normal. The right ventricular size is mildly enlarged. There is severely elevated pulmonary artery systolic pressure. The estimated right ventricular systolic pressure is 67.3 mmHg.  3. Given severely elevated pulmonary pressures, cardiac tamponade may be masked in this patient. Moderate pericardial effusion. The pericardial effusion is circumferential.  4. The mitral valve is grossly normal. Trivial mitral valve regurgitation.  5. The aortic valve is grossly normal. Aortic valve regurgitation is not visualized. No aortic stenosis is present. FINDINGS  Left Ventricle: Left ventricular ejection fraction, by estimation, is 65 to 70%. Left ventricular ejection fraction by PLAX is 73 %. The left ventricle has normal function. The left ventricle has no regional wall motion abnormalities. The left ventricular internal cavity size was normal in size. There is no left ventricular hypertrophy. Left ventricular diastolic parameters are consistent with Grade II diastolic dysfunction (pseudonormalization). Right Ventricle: The right ventricular size is mildly enlarged. No increase in  right ventricular wall thickness. Right ventricular systolic function is normal. There is severely elevated pulmonary artery systolic pressure. The tricuspid regurgitant velocity is 3.85 m/s, and with an assumed right atrial pressure of 8 mmHg, the estimated right ventricular systolic pressure is 67.3 mmHg. Left Atrium: Left atrial size was normal in size. Right Atrium: Right atrial size was normal in size. Pericardium: Given severely elevated pulmonary pressures, cardiac tamponade may be masked in this patient. A moderately sized pericardial effusion is present. The pericardial effusion is circumferential. There is excessive respiratory variation in the mitral valve spectral Doppler velocities. Mitral Valve: The mitral valve is grossly normal. Trivial mitral valve regurgitation. MV peak gradient, 9.2 mmHg. The mean mitral valve gradient is 5.0 mmHg. Tricuspid Valve: The tricuspid valve is grossly normal. Tricuspid valve regurgitation is mild. Aortic Valve: The aortic valve is grossly normal. Aortic valve regurgitation is not visualized. No aortic stenosis is present. Aortic valve mean gradient measures 14.0 mmHg. Aortic valve peak gradient measures 29.8 mmHg. Aortic valve area, by VTI measures 1.79 cm.  62.0 in Accession #:    [id number]       Weight:       161.2 lb Date of Birth:  1946-04-11        BSA:          1.744 m Patient Age:    77 years         BP:           136/59 mmHg Patient Gender: F                 HR:           119 bpm. Exam Location:  [facility] Procedure: 2D Echo, Cardiac Doppler and Color Doppler Indications:     Elevated Troponin  History:         Patient has prior history of Echocardiogram examinations, most                  recent 07/30/2022. COPD, Arrythmias:Tachycardia; Risk                  Factors:Hypertension, Diabetes and Sleep Apnea.  Sonographer:     Mikki Harbor Referring Phys:  [id number] BRITTON L RUST-CHESTER Diagnosing Phys: Rozell Searing Custovic IMPRESSIONS  1. Left ventricular ejection fraction, by estimation, is 65 to 70%. Left ventricular ejection fraction by PLAX is 73 %. The left ventricle has normal function. The left ventricle has no regional wall motion abnormalities. Left ventricular diastolic parameters are consistent with Grade II diastolic dysfunction (pseudonormalization).  2. Right ventricular systolic function is normal. The right ventricular size is mildly enlarged. There is severely elevated pulmonary artery systolic pressure. The estimated right ventricular systolic pressure is 67.3 mmHg.  3. Given severely elevated pulmonary pressures, cardiac tamponade may be masked in this patient. Moderate pericardial effusion. The pericardial effusion is circumferential.  4. The mitral valve is grossly normal. Trivial mitral valve regurgitation.  5. The aortic valve is grossly normal. Aortic valve regurgitation is not visualized. No aortic stenosis is present. FINDINGS  Left Ventricle: Left ventricular ejection fraction, by estimation, is 65 to 70%. Left ventricular ejection fraction by PLAX is 73 %. The left ventricle has normal function. The left ventricle has no regional wall motion abnormalities. The left ventricular internal cavity size was normal in size. There is no left ventricular hypertrophy. Left ventricular diastolic parameters are consistent with Grade II diastolic dysfunction (pseudonormalization). Right Ventricle: The right ventricular size is mildly enlarged. No increase in  right ventricular wall thickness. Right ventricular systolic function is normal. There is severely elevated pulmonary artery systolic pressure. The tricuspid regurgitant velocity is 3.85 m/s, and with an assumed right atrial pressure of 8 mmHg, the estimated right ventricular systolic pressure is 67.3 mmHg. Left Atrium: Left atrial size was normal in size. Right Atrium: Right atrial size was normal in size. Pericardium: Given severely elevated pulmonary pressures, cardiac tamponade may be masked in this patient. A moderately sized pericardial effusion is present. The pericardial effusion is circumferential. There is excessive respiratory variation in the mitral valve spectral Doppler velocities. Mitral Valve: The mitral valve is grossly normal. Trivial mitral valve regurgitation. MV peak gradient, 9.2 mmHg. The mean mitral valve gradient is 5.0 mmHg. Tricuspid Valve: The tricuspid valve is grossly normal. Tricuspid valve regurgitation is mild. Aortic Valve: The aortic valve is grossly normal. Aortic valve regurgitation is not visualized. No aortic stenosis is present. Aortic valve mean gradient measures 14.0 mmHg. Aortic valve peak gradient measures 29.8 mmHg. Aortic valve area, by VTI measures 1.79 cm.  Hematology/Oncology Consult note [facility] [phone] [phone]  Patient Care Team: Danella Penton, MD as PCP - General (Internal Medicine) Kemper Durie, RN as [facility] Care Management   Name of the patient: Sara Mcdonald  [id number]  04-14-1946    Reason for consult: Concern for acute leukemia based on peripheral blood smear   Requesting physician: Dr. Belia Heman  Date of visit: 10/06/2023    History of presenting illness-patient is a 77 year old female with a past medical history significant for hypertension hyperlipidemia type 2 diabetes obstructive sleep apnea and paroxysmal A-fib who presented to the ER with symptoms of worsening shortness of breath.  She is currently being treated in the ICU for acute hypoxic respiratory failure.  On her labs she was noted to have creatinine of 1.4, elevated BNP.  White cell count was elevated at 24.7 with a hemoglobin of 7.9 and platelets of 59.There was concern for circulating blasts in the peripheral blood.  A year ago patient was noted to have a white count of 10.7 with a normal platelet count and a hemoglobin of 9.4.  ECOG PS- ***  Pain scale- ***   Review of systems- ROS  Allergies  Allergen Reactions   Elemental Sulfur Rash   Sulfa Antibiotics Rash   Tricor [Fenofibrate] Rash    Patient Active Problem List   Diagnosis Date Noted   Shock circulatory (HCC) 10/05/2023   Diverticulosis of colon without hemorrhage    Erythema of colon    Grade II internal hemorrhoids    Adenomatous polyp of transverse colon    Ileus (HCC) 07/24/2022   Spondylolisthesis of lumbar region 07/18/2022   Lumbar spondylosis 11/17/2021   Lumbar degenerative disc disease 11/17/2021   Chronic pain syndrome 11/17/2021   Sinus tachycardia    Generalized abdominal pain    Acute bronchitis    Weakness    Severe sepsis with acute organ dysfunction (HCC) 09/04/2020   Diabetes mellitus without  complication (HCC)    Hypertension associated with diabetes (HCC)    Acute kidney injury superimposed on CKD (HCC)    Acute lower UTI    Anxiety and depression    OSA (obstructive sleep apnea)    Type 2 diabetes mellitus with hyperlipidemia (HCC)    Acute metabolic encephalopathy    Moderate persistent asthma    Sepsis (HCC) 03/17/2017   Community acquired pneumonia 03/17/2017   COPD with acute exacerbation (HCC) 03/17/2017   Hypoxia 03/17/2017   Hyponatremia 03/17/2017   Acute on chronic renal insufficiency 03/17/2017   Pneumonia 03/17/2017   Acute respiratory failure with hypoxia (HCC) 02/01/2017     Past Medical History:  Diagnosis Date   Anxiety    Chronic kidney disease    CKD3   Depression    Diabetes mellitus without complication (HCC)    Dyspnea    with episodes of pleurisy   Elevated lipids    Fibromyalgia    GERD (gastroesophageal reflux disease)    Headache    Hypertension    OA (osteoarthritis)    Pneumonia    has had several times. last time 2019   Sleep apnea      Past Surgical History:  Procedure Laterality Date   APPENDECTOMY     BIOPSY  07/29/2022   Procedure: BIOPSY;  Surgeon: Shellia Cleverly, DO;  Location: [facility];  Service: Gastroenterology;;   CHOLECYSTECTOMY     COLONOSCOPY WITH PROPOFOL N/A 07/07/2016   Procedure: COLONOSCOPY WITH PROPOFOL;  Surgeon: Molly Maduro  Hematology/Oncology Consult note [facility] [phone] [phone]  Patient Care Team: Danella Penton, MD as PCP - General (Internal Medicine) Kemper Durie, RN as [facility] Care Management   Name of the patient: Sara Mcdonald  [id number]  04-14-1946    Reason for consult: Concern for acute leukemia based on peripheral blood smear   Requesting physician: Dr. Belia Heman  Date of visit: 10/06/2023    History of presenting illness-patient is a 77 year old female with a past medical history significant for hypertension hyperlipidemia type 2 diabetes obstructive sleep apnea and paroxysmal A-fib who presented to the ER with symptoms of worsening shortness of breath.  She is currently being treated in the ICU for acute hypoxic respiratory failure.  On her labs she was noted to have creatinine of 1.4, elevated BNP.  White cell count was elevated at 24.7 with a hemoglobin of 7.9 and platelets of 59.There was concern for circulating blasts in the peripheral blood.  A year ago patient was noted to have a white count of 10.7 with a normal platelet count and a hemoglobin of 9.4.  ECOG PS- ***  Pain scale- ***   Review of systems- ROS  Allergies  Allergen Reactions   Elemental Sulfur Rash   Sulfa Antibiotics Rash   Tricor [Fenofibrate] Rash    Patient Active Problem List   Diagnosis Date Noted   Shock circulatory (HCC) 10/05/2023   Diverticulosis of colon without hemorrhage    Erythema of colon    Grade II internal hemorrhoids    Adenomatous polyp of transverse colon    Ileus (HCC) 07/24/2022   Spondylolisthesis of lumbar region 07/18/2022   Lumbar spondylosis 11/17/2021   Lumbar degenerative disc disease 11/17/2021   Chronic pain syndrome 11/17/2021   Sinus tachycardia    Generalized abdominal pain    Acute bronchitis    Weakness    Severe sepsis with acute organ dysfunction (HCC) 09/04/2020   Diabetes mellitus without  complication (HCC)    Hypertension associated with diabetes (HCC)    Acute kidney injury superimposed on CKD (HCC)    Acute lower UTI    Anxiety and depression    OSA (obstructive sleep apnea)    Type 2 diabetes mellitus with hyperlipidemia (HCC)    Acute metabolic encephalopathy    Moderate persistent asthma    Sepsis (HCC) 03/17/2017   Community acquired pneumonia 03/17/2017   COPD with acute exacerbation (HCC) 03/17/2017   Hypoxia 03/17/2017   Hyponatremia 03/17/2017   Acute on chronic renal insufficiency 03/17/2017   Pneumonia 03/17/2017   Acute respiratory failure with hypoxia (HCC) 02/01/2017     Past Medical History:  Diagnosis Date   Anxiety    Chronic kidney disease    CKD3   Depression    Diabetes mellitus without complication (HCC)    Dyspnea    with episodes of pleurisy   Elevated lipids    Fibromyalgia    GERD (gastroesophageal reflux disease)    Headache    Hypertension    OA (osteoarthritis)    Pneumonia    has had several times. last time 2019   Sleep apnea      Past Surgical History:  Procedure Laterality Date   APPENDECTOMY     BIOPSY  07/29/2022   Procedure: BIOPSY;  Surgeon: Shellia Cleverly, DO;  Location: [facility];  Service: Gastroenterology;;   CHOLECYSTECTOMY     COLONOSCOPY WITH PROPOFOL N/A 07/07/2016   Procedure: COLONOSCOPY WITH PROPOFOL;  Surgeon: Molly Maduro  62.0 in Accession #:    [id number]       Weight:       161.2 lb Date of Birth:  1946-04-11        BSA:          1.744 m Patient Age:    77 years         BP:           136/59 mmHg Patient Gender: F                 HR:           119 bpm. Exam Location:  [facility] Procedure: 2D Echo, Cardiac Doppler and Color Doppler Indications:     Elevated Troponin  History:         Patient has prior history of Echocardiogram examinations, most                  recent 07/30/2022. COPD, Arrythmias:Tachycardia; Risk                  Factors:Hypertension, Diabetes and Sleep Apnea.  Sonographer:     Mikki Harbor Referring Phys:  [id number] BRITTON L RUST-CHESTER Diagnosing Phys: Rozell Searing Custovic IMPRESSIONS  1. Left ventricular ejection fraction, by estimation, is 65 to 70%. Left ventricular ejection fraction by PLAX is 73 %. The left ventricle has normal function. The left ventricle has no regional wall motion abnormalities. Left ventricular diastolic parameters are consistent with Grade II diastolic dysfunction (pseudonormalization).  2. Right ventricular systolic function is normal. The right ventricular size is mildly enlarged. There is severely elevated pulmonary artery systolic pressure. The estimated right ventricular systolic pressure is 67.3 mmHg.  3. Given severely elevated pulmonary pressures, cardiac tamponade may be masked in this patient. Moderate pericardial effusion. The pericardial effusion is circumferential.  4. The mitral valve is grossly normal. Trivial mitral valve regurgitation.  5. The aortic valve is grossly normal. Aortic valve regurgitation is not visualized. No aortic stenosis is present. FINDINGS  Left Ventricle: Left ventricular ejection fraction, by estimation, is 65 to 70%. Left ventricular ejection fraction by PLAX is 73 %. The left ventricle has normal function. The left ventricle has no regional wall motion abnormalities. The left ventricular internal cavity size was normal in size. There is no left ventricular hypertrophy. Left ventricular diastolic parameters are consistent with Grade II diastolic dysfunction (pseudonormalization). Right Ventricle: The right ventricular size is mildly enlarged. No increase in  right ventricular wall thickness. Right ventricular systolic function is normal. There is severely elevated pulmonary artery systolic pressure. The tricuspid regurgitant velocity is 3.85 m/s, and with an assumed right atrial pressure of 8 mmHg, the estimated right ventricular systolic pressure is 67.3 mmHg. Left Atrium: Left atrial size was normal in size. Right Atrium: Right atrial size was normal in size. Pericardium: Given severely elevated pulmonary pressures, cardiac tamponade may be masked in this patient. A moderately sized pericardial effusion is present. The pericardial effusion is circumferential. There is excessive respiratory variation in the mitral valve spectral Doppler velocities. Mitral Valve: The mitral valve is grossly normal. Trivial mitral valve regurgitation. MV peak gradient, 9.2 mmHg. The mean mitral valve gradient is 5.0 mmHg. Tricuspid Valve: The tricuspid valve is grossly normal. Tricuspid valve regurgitation is mild. Aortic Valve: The aortic valve is grossly normal. Aortic valve regurgitation is not visualized. No aortic stenosis is present. Aortic valve mean gradient measures 14.0 mmHg. Aortic valve peak gradient measures 29.8 mmHg. Aortic valve area, by VTI measures 1.79 cm.

## 2023-10-06 NOTE — Progress Notes (Signed)
Pharmacy Antibiotic Note  Sara Mcdonald is a 77 y.o. female admitted on 10/05/2023 with pneumonia.  Pharmacy has been consulted for Vancomycin dosing.  Plan: Pt ordered Vancomycin 1750 mg once per TBW 73.1 kg.  SCr elevated and still trending up.  Expected Dosing Interval currently > 24 hr.  Pharmacy will continue to follow and will place additional abx orders whenever warranted.  Temp (24hrs), Avg:98.4 F (36.9 C), Min:98.3 F (36.8 C), Max:98.5 F (36.9 C)   Recent Labs  Lab 10/05/23 1727 10/05/23 2001 10/06/23 0333  WBC 24.7*  --  38.6*  CREATININE 1.48*  --  1.51*  LATICACIDVEN 1.3 1.4  --     Estimated Creatinine Clearance: 29.2 mL/min (A) (by C-G formula based on SCr of 1.51 mg/dL (H)).    Allergies  Allergen Reactions   Elemental Sulfur Rash   Sulfa Antibiotics Rash   Tricor [Fenofibrate] Rash    Antimicrobials this admission: 10/17 Azithromycin >> x 5 days  10/17 Ceftriaxone >> x 5 days 10/18 Vancomycin >>   Microbiology results: 10/17 BCx: Pending 10/17 Sputum: Pending  Thank you for allowing pharmacy to be a part of this patient's care.  Otelia Sergeant, PharmD, [facility] 10/06/2023 5:26 AM

## 2023-10-06 NOTE — Progress Notes (Signed)
NAME:  Sara Mcdonald, MRN:  [id number], DOB:  1946-08-08, LOS: 1 ADMISSION DATE:  2023/10/21, CONSULTATION DATE:  October 21, 2023 REFERRING MD:  [name], CHIEF COMPLAINT:  Shortness of Breath  History of Present Illness:  77 yo F presenting to [facility] ED from home via EMS on 10-21-23 for evaluation of dyspnea.  History provided per chart review and bedside patient interview. Patient reports having increased shortness of breath from baseline over the last 2 weeks with correlating productive cough and yellow sputum. Over the last 3 days the dyspnea worsened and then on 10-21-23 she started experiencing persistent 10/10 chest pain and tightness as well as a subjective chills and fever which she treated with Tylenol x 2. She denied urinary symptoms, abdominal pain/nausea/ vomiting, but did admit to one episode of loose stools- she denied any blood in her stool. She denies any tobacco use history, ETOH or recreational drug use. She reports taking all medication as prescribed with the exception of her lasix. Instead of taking this daily she reports to only taking it when she "feels wheezy". Of note patient was recently seen by PCP who noted drop in Hgb from 12.3 to 9.8, patient was started on iron supplementation with plans to check lab work in a few weeks. She had also been taken off her metformin after losing some weight, but it was noted on her last PCP visit that she has regained the lost weight and was experiencing polydipsia. Metformin was restarted.   EMS noted hypoxia at 80% and tachycardia on arrival. Patient was placed on a NRB during transport with improvement in Spo2 to 99%  ED course: Upon arrival patient alert and oriented with initial EKG shows A-fib RVR in the 160's and BP soft but stable. Patient received 500 mL bolus while awaiting lab work and she converted into NSR/low ST. Labs significant for leukocytosis without lactic acidosis, continued anemia, thrombocytopenia, mild NAGMA with close to  baseline kidney function, elevated BNP, LDH, troponin suggestive of demand ischemia, elevated PCT, D.Dimer, INR and fibrinogen. Imaging revealed small bilateral pleural effusions and possible left lower lobe pneumonia. BP dropped and peripheral levophed was started. Medications given: Fentanyl, morphine, K+, azithromycin & rocephin, 500 mL LR bolus, IV contrast, levophed drip started  COVID-19 & Influenza A/B: negative  CXR 21-Oct-2023: Cardiomegaly with mild central congestion and probable small pleural effusions CT angio chest PE Oct 21, 2023: Negative for acute pulmonary embolus. Cardiomegaly with small left greater than right pleural effusions. Mild diffuse mosaicism which could be due to small airways disease. Low-grade edema could also be considered. CT abdomen/pelvis wo contrast 10-21-2023: No CT evidence for acute intra-abdominal or pelvic abnormality  PCCM consulted for admission due to circulatory shock requiring peripheral vasopressor support in the setting of CAP & HFpEF exacerbation.  Pertinent  Medical History  CKD 3b HTN OSA Fibromyalgia Anxiety & Depression T2DM HLD Rheumatoid factor + IBS Atrial Fibrillation on Eliquis  Significant Hospital Events: Including procedures, antibiotic start and stop dates in addition to other pertinent events   October 21, 2023: Admit to ICU with circulatory shock on peripheral vasopressors in the setting of suspected CAP & HFpEF exacerbation 10/18 off pressors, active chest pain/SOB  Interim History / Subjective:  Patient alert and oriented  Off pressors I was notified that patient has Blasts on blood smear Patient with increased WOB and SOB Placed on high flow [place]   Objective   Blood pressure (!) 115/56, pulse (!) 117, temperature 98.9 F (37.2 C), temperature source Oral, resp. rate Marland Kitchen)  38, height 5\' 2"  (1.575 m), weight 73.1 kg, SpO2 95%.        Intake/Output Summary (Last 24 hours) at 10/06/2023 0759 Last data filed at 10/06/2023  0417 Gross per 24 hour  Intake 907.56 ml  Output 350 ml  Net 557.56 ml   Filed Weights   09/21/2023 1721 10/06/23 0500  Weight: 71.7 kg 73.1 kg      Review of Systems:  Gen:  fatigue and generalized weakness,  HEENT: Denies blurred vision, double vision, ear pain, eye pain, hearing loss, nose bleeds, sore throat Cardiac:  No dizziness, chest pain or heaviness, +chest tightness Resp:   +shortness of breath,-wheezing, -hemoptysis,  Gi: Denies swallowing difficulty, stomach pain, nausea or vomiting, diarrhea, constipation, bowel incontinence Gu:  Denies bladder incontinence, burning urine Ext:   Denies Joint pain, stiffness or swelling Skin: Denies  skin rash, easy bruising or bleeding or hives Endoc:  Denies polyuria, polydipsia , polyphagia or weight change Psych:   Denies depression, insomnia or hallucinations  Other:  All other systems negative   PHYSICAL EXAMINATION:  GENERAL:critically ill appearing, +resp distress EYES: Pupils equal, round, reactive to light.  No scleral icterus.  MOUTH: Moist mucosal membrane. NECK: Supple.  PULMONARY: Lungs clear to auscultation, +rhonchi, +wheezing CARDIOVASCULAR: S1 and S2.  Regular rate and rhythm GASTROINTESTINAL: Soft, nontender, -distended. Positive bowel sounds.  MUSCULOSKELETAL: No swelling, clubbing, or edema.  NEUROLOGIC: alert and awake, following commands SKIN:normal, warm to touch, Capillary refill delayed  Pulses present bilaterally   Assessment & Plan:   77 yo white female with acute SOB and NSTEMI with acute and severe resp distress with chest pain with high probability of Leukemia   Leukocytosis without Lactic Acidosis suspect Blast crisis Circulatory Shock- -use vasopressors to keep MAP>65 as needed -follow ABG and LA as needed -follow up cultures  Acute on Chronic HFpEF exacerbation PAF on Eliquis Elevated Troponin secondary to N-STEMI  PMHx: HTN, HLD - Echocardiogram ordered - f/u lipid panel,  continue pravastatin - Continuous cardiac monitoring  - Daily weights to assess volume status - Diurese with the use of IV lasix as hemodynamics and renal function allow -Cardiology consult  CKD Stage 3b NAGMA Hypokalemia - Strict I/O's: alert provider if UOP < 0.5 mL/kg/hr - conservative IVF resuscitation - Daily BMP, replace electrolytes PRN - Avoid nephrotoxic agents as able, ensure adequate renal perfusion - K+ supplementation given  Worsening Anemia secondary to unknown etiology in the setting of Chronic Disease & Critical Illness New Thrombocytopenia s/t unknown etiology BLAST CRISIS ONCOLOGY CONSULTED - Monitor for s/s of bleeding - Daily CBC, Monitor coag panel, f/u iron anemia panel - continue PPI BID - Transfuse for Hgb <7 & Consider transfusion of platelets if < 10   OSA Patient has a CPAP ordered but has not been wearing it due to discomfort - CPAP ordered QHS - Supplemental oxygen as needed, to maintain SpO2 > 90%   ENDO - ICU hypoglycemic\Hyperglycemia protocol -check FSBS per protocol   GI GI PROPHYLAXIS as indicated  NUTRITIONAL STATUS DIET--> as tolerated Constipation protocol as indicated   ELECTROLYTES -follow labs as needed -replace as needed -pharmacy consultation and following   Best Practice (right click and "Reselect all SmartList Selections" daily)  Diet/type: NPO w/ oral meds DVT prophylaxis: SCD GI prophylaxis: PPI Lines: N/A Foley:  N/A Code Status:  DNR/DNI   Labs   CBC: Recent Labs  Lab 10/16/2023 1727 10/06/23 0333  WBC 24.7* 38.6*  NEUTROABS 6.1  --   HGB  7.9* 8.0*  HCT 25.0* 24.7*  MCV 101.6* 98.8  PLT 59* 71*    Basic Metabolic Panel: Recent Labs  Lab 10/06/2023 1727 10/06/23 0333  NA 134* 136  K 3.3* 3.9  CL 106 108  CO2 18* 19*  GLUCOSE 183* 168*  BUN 18 18  CREATININE 1.48* 1.51*  CALCIUM 8.2* 7.9*  MG 1.7 1.8  PHOS 3.5 4.1   GFR: Estimated Creatinine Clearance: 29.2 mL/min (A) (by C-G formula  based on SCr of 1.51 mg/dL (H)). Recent Labs  Lab 09/20/2023 1727 09/24/2023 2001 10/06/23 0333  PROCALCITON 6.60  --  144.56  WBC 24.7*  --  38.6*  LATICACIDVEN 1.3 1.4  --     Liver Function Tests: Recent Labs  Lab 09/25/2023 1727  AST 23  ALT 17  ALKPHOS 53  BILITOT 1.5*  PROT 5.6*  ALBUMIN 3.0*   No results for input(s): "LIPASE", "AMYLASE" in the last 168 hours. No results for input(s): "AMMONIA" in the last 168 hours.  ABG    Component Value Date/Time   PHART 7.37 10/06/2023 0404   PCO2ART 31 (L) 10/06/2023 0404   PO2ART 122 (H) 10/06/2023 0404   HCO3 17.9 (L) 10/06/2023 0404   ACIDBASEDEF 6.2 (H) 10/06/2023 0404   O2SAT 98.9 10/06/2023 0404     Coagulation Profile: Recent Labs  Lab 09/21/2023 1727  INR 2.1*    Cardiac Enzymes: No results for input(s): "CKTOTAL", "CKMB", "CKMBINDEX", "TROPONINI" in the last 168 hours.  HbA1C: Hgb A1c MFr Bld  Date/Time Value Ref Range Status  10/06/2023 12:12 AM 6.3 (H) 4.8 - 5.6 % Final    Comment:    (NOTE) Pre diabetes:          5.7%-6.4%  Diabetes:              >6.4%  Glycemic control for   <7.0% adults with diabetes   07/18/2022 08:25 PM 6.2 (H) 4.8 - 5.6 % Final    Comment:    (NOTE) Pre diabetes:          5.7%-6.4%  Diabetes:              >6.4%  Glycemic control for   <7.0% adults with diabetes     CBG: Recent Labs  Lab 10/11/2023 2207 09/28/2023 2347 10/06/23 0321  GLUCAP 181* 152* 140*     DVT/GI PRX  assessed I Assessed the need for Labs I Assessed the need for Foley I Assessed the need for Central Venous Line Family Discussion when available I Assessed the need for Mobilization I made an Assessment of medications to be adjusted accordingly Safety Risk assessment completed  CASE DISCUSSED IN MULTIDISCIPLINARY ROUNDS WITH ICU TEAM     Critical Care Time devoted to patient care services described in this note is 55 minutes.  Critical care was necessary to treat /prevent imminent and  life-threatening deterioration. Overall, patient is critically ill, prognosis is guarded.  Patient with Multiorgan failure and at high risk for cardiac arrest and death.    Lucie Leather, M.D.  [facility]  Medical Director [facility] Medical Director [facility] Cardio-Pulmonary Department

## 2023-10-06 NOTE — Plan of Care (Signed)
  Problem: Education: Goal: Understanding of post-operative needs will improve Outcome: Not Progressing Goal: Individualized Educational Video(s) Outcome: Not Progressing   Problem: Clinical Measurements: Goal: Postoperative complications will be avoided or minimized Outcome: Not Progressing   Problem: Respiratory: Goal: Will regain and/or maintain adequate ventilation Outcome: Not Progressing   Problem: Education: Goal: Knowledge of General Education information will improve Description: Including pain rating scale, medication(s)/side effects and non-pharmacologic comfort measures Outcome: Not Progressing   Problem: Health Behavior/Discharge Planning: Goal: Ability to manage health-related needs will improve Outcome: Not Progressing   Problem: Clinical Measurements: Goal: Ability to maintain clinical measurements within normal limits will improve Outcome: Not Progressing Goal: Will remain free from infection Outcome: Not Progressing Goal: Diagnostic test results will improve Outcome: Not Progressing Goal: Respiratory complications will improve Outcome: Not Progressing Goal: Cardiovascular complication will be avoided Outcome: Not Progressing   Problem: Activity: Goal: Risk for activity intolerance will decrease Outcome: Not Progressing   Problem: Nutrition: Goal: Adequate nutrition will be maintained Outcome: Not Progressing   Problem: Coping: Goal: Level of anxiety will decrease Outcome: Not Progressing   Problem: Elimination: Goal: Will not experience complications related to bowel motility Outcome: Not Progressing Goal: Will not experience complications related to urinary retention Outcome: Not Progressing   Problem: Pain Managment: Goal: General experience of comfort will improve Outcome: Not Progressing   Problem: Safety: Goal: Ability to remain free from injury will improve Outcome: Not Progressing   Problem: Skin Integrity: Goal: Risk for impaired  skin integrity will decrease Outcome: Not Progressing   Problem: Education: Goal: Ability to describe self-care measures that may prevent or decrease complications (Diabetes Survival Skills Education) will improve Outcome: Not Progressing Goal: Individualized Educational Video(s) Outcome: Not Progressing   Problem: Coping: Goal: Ability to adjust to condition or change in health will improve Outcome: Not Progressing   Problem: Fluid Volume: Goal: Ability to maintain a balanced intake and output will improve Outcome: Not Progressing   Problem: Health Behavior/Discharge Planning: Goal: Ability to identify and utilize available resources and services will improve Outcome: Not Progressing Goal: Ability to manage health-related needs will improve Outcome: Not Progressing   Problem: Metabolic: Goal: Ability to maintain appropriate glucose levels will improve Outcome: Not Progressing   Problem: Nutritional: Goal: Maintenance of adequate nutrition will improve Outcome: Not Progressing Goal: Progress toward achieving an optimal weight will improve Outcome: Not Progressing   Problem: Skin Integrity: Goal: Risk for impaired skin integrity will decrease Outcome: Not Progressing   Problem: Tissue Perfusion: Goal: Adequacy of tissue perfusion will improve Outcome: Not Progressing

## 2023-10-06 NOTE — Progress Notes (Signed)
Dr Melton Alar made aware of ST elevation on lateral leads. EKG done. She is also aware of the pt's BP with systolic 70's-80's. Advised to to maintenance infusion of LR at 75 cc/hour after 500 bolus is finished.

## 2023-10-06 NOTE — Progress Notes (Signed)
ANTICOAGULATION CONSULT NOTE  Pharmacy Consult for heparin infusion Indication: nSTEMI  Allergies  Allergen Reactions   Elemental Sulfur Rash   Sulfa Antibiotics Rash   Tricor [Fenofibrate] Rash   Patient Measurements: Height: 5\' 2"  (157.5 cm) Weight: 73.1 kg (161 lb 2.5 oz) IBW/kg (Calculated) : 50.1 Heparin Dosing Weight: 65.3 kg  Vital Signs: Temp: 100.4 F (38 C) (10/18 1200) Temp Source: Axillary (10/18 1200) BP: 97/51 (10/18 1304) Pulse Rate: 110 (10/18 1304)  Labs: Recent Labs    10/05/23 1727 10/05/23 2000 10/06/23 0333 10/06/23 0911 10/06/23 1127  HGB 7.9*  --  8.0*  --  7.9*  HCT 25.0*  --  24.7*  --  25.9*  PLT 59*  --  71*  --  76*  APTT 56*  --   --   --   --   LABPROT 23.6*  --   --   --   --   INR 2.1*  --   --   --   --   CREATININE 1.48*  --  1.51*  --   --   CKTOTAL  --   --   --   --  54  TROPONINIHS 36*   < > 600* 602* 594*   < > = values in this interval not displayed.    Estimated Creatinine Clearance: 29.2 mL/min (A) (by C-G formula based on SCr of 1.51 mg/dL (H)).   Medical History: Past Medical History:  Diagnosis Date   Anxiety    Chronic kidney disease    CKD3   Depression    Diabetes mellitus without complication (HCC)    Dyspnea    with episodes of pleurisy   Elevated lipids    Fibromyalgia    GERD (gastroesophageal reflux disease)    Headache    Hypertension    OA (osteoarthritis)    Pneumonia    has had several times. last time 2019   Sleep apnea    Medications:  PTA Meds: Apixaban 5 mg BID, last down unknown  Assessment: Pt is a 77 yo female presenting to ED with hx of A. Fib on Eliquis, now with elevated troponin I level, trending up.  Both baseline INR and aPTT elevated.  Goal of Therapy:  Heparin level 0.3-0.7 units/ml aPTT 66-102 seconds Monitor platelets by anticoagulation protocol: Yes  Plan:  1018 1126 aPTT > 200/ HL > 1.10 - supratherapeutic  Hold heparin for 1 hour (Stopped at 1335)  Restart  heparin infusion at 1435 at 650 units/hr  Recheck aPTT 8 hours after restarting infusion and HL daily while on heparin  Continue to monitor H/H and CBC daily  Littie Deeds, PharmD Pharmacy Resident  10/06/2023 1:44 PM

## 2023-10-06 NOTE — Plan of Care (Signed)
CH[facility] Tonsillectomy/Adenoidectomy, Postoperative PEDS care plan entered in error.

## 2023-10-06 NOTE — Progress Notes (Signed)
*  PRELIMINARY RESULTS* Echocardiogram 2D Echocardiogram has been performed.  Sara Mcdonald 10/06/2023, 10:31 AM

## 2023-10-07 ENCOUNTER — Inpatient Hospital Stay: Payer: PPO

## 2023-10-07 DIAGNOSIS — I4891 Unspecified atrial fibrillation: Secondary | ICD-10-CM

## 2023-10-07 DIAGNOSIS — I5023 Acute on chronic systolic (congestive) heart failure: Secondary | ICD-10-CM | POA: Diagnosis not present

## 2023-10-07 DIAGNOSIS — Z515 Encounter for palliative care: Secondary | ICD-10-CM | POA: Diagnosis not present

## 2023-10-07 DIAGNOSIS — D72829 Elevated white blood cell count, unspecified: Secondary | ICD-10-CM | POA: Diagnosis not present

## 2023-10-07 LAB — GLUCOSE, CAPILLARY
Glucose-Capillary: 148 mg/dL — ABNORMAL HIGH (ref 70–99)
Glucose-Capillary: 200 mg/dL — ABNORMAL HIGH (ref 70–99)

## 2023-10-07 LAB — PROTIME-INR
INR: 2.1 — ABNORMAL HIGH (ref 0.8–1.2)
Prothrombin Time: 23.8 s — ABNORMAL HIGH (ref 11.4–15.2)

## 2023-10-07 LAB — HAPTOGLOBIN: Haptoglobin: 45 mg/dL (ref 42–346)

## 2023-10-07 LAB — CBC
HCT: 23.9 % — ABNORMAL LOW (ref 36.0–46.0)
Hemoglobin: 7.5 g/dL — ABNORMAL LOW (ref 12.0–15.0)
MCH: 32.3 pg (ref 26.0–34.0)
MCHC: 31.4 g/dL (ref 30.0–36.0)
MCV: 103 fL — ABNORMAL HIGH (ref 80.0–100.0)
Platelets: 73 10*3/uL — ABNORMAL LOW (ref 150–400)
RBC: 2.32 MIL/uL — ABNORMAL LOW (ref 3.87–5.11)
RDW: 18.3 % — ABNORMAL HIGH (ref 11.5–15.5)
WBC: 39.3 10*3/uL — ABNORMAL HIGH (ref 4.0–10.5)
nRBC: 1.1 % — ABNORMAL HIGH (ref 0.0–0.2)

## 2023-10-07 LAB — BASIC METABOLIC PANEL
Anion gap: 12 (ref 5–15)
BUN: 41 mg/dL — ABNORMAL HIGH (ref 8–23)
CO2: 16 mmol/L — ABNORMAL LOW (ref 22–32)
Calcium: 7.5 mg/dL — ABNORMAL LOW (ref 8.9–10.3)
Chloride: 104 mmol/L (ref 98–111)
Creatinine, Ser: 2.89 mg/dL — ABNORMAL HIGH (ref 0.44–1.00)
GFR, Estimated: 16 mL/min — ABNORMAL LOW (ref >60)
Glucose, Bld: 220 mg/dL — ABNORMAL HIGH (ref 70–99)
Potassium: 4.3 mmol/L (ref 3.5–5.1)
Sodium: 132 mmol/L — ABNORMAL LOW (ref 135–145)

## 2023-10-07 LAB — HEPARIN LEVEL (UNFRACTIONATED): Heparin Unfractionated: >1.1 [IU]/mL — ABNORMAL HIGH (ref 0.30–0.70)

## 2023-10-07 LAB — APTT
aPTT: 175 s (ref 24–36)
aPTT: 66 s — ABNORMAL HIGH (ref 24–36)

## 2023-10-07 LAB — MAGNESIUM: Magnesium: 2.4 mg/dL (ref 1.7–2.4)

## 2023-10-07 LAB — PHOSPHORUS: Phosphorus: 6.2 mg/dL — ABNORMAL HIGH (ref 2.5–4.6)

## 2023-10-07 LAB — PROCALCITONIN: Procalcitonin: 146.33 ng/mL

## 2023-10-07 MED ORDER — LORAZEPAM 1 MG PO TABS: 1.0000 mg | ORAL_TABLET | ORAL | Status: DC | PRN

## 2023-10-07 MED ORDER — LEVALBUTEROL HCL 0.63 MG/3ML IN NEBU: 0.6300 mg | INHALATION_SOLUTION | Freq: Three times a day (TID) | RESPIRATORY_TRACT | Status: DC

## 2023-10-07 MED ORDER — HALOPERIDOL LACTATE 2 MG/ML PO CONC: 0.5000 mg | ORAL | Status: DC | PRN

## 2023-10-07 MED ORDER — PHENYLEPHRINE HCL-NACL 20-0.9 MG/250ML-% IV SOLN
0.0000 ug/min | INTRAVENOUS | Status: DC
Start: 2023-10-07 — End: 2023-10-07

## 2023-10-07 MED ORDER — TRAMADOL HCL 50 MG PO TABS
50.0000 mg | ORAL_TABLET | Freq: Three times a day (TID) | ORAL | Status: DC | PRN
  Administered 2023-10-07: 50 mg via ORAL
  Filled 2023-10-07: qty 1

## 2023-10-07 MED ORDER — HALOPERIDOL LACTATE 5 MG/ML IJ SOLN: 0.5000 mg | INTRAMUSCULAR | Status: DC | PRN

## 2023-10-07 MED ORDER — PHENYLEPHRINE HCL-NACL 20-0.9 MG/250ML-% IV SOLN
25.0000 ug/min | INTRAVENOUS | Status: DC
  Administered 2023-10-07: 25 ug/min via INTRAVENOUS
  Administered 2023-10-07: 65 ug/min via INTRAVENOUS
  Filled 2023-10-07 (×2): qty 250

## 2023-10-07 MED ORDER — GLYCOPYRROLATE 1 MG PO TABS: 1.0000 mg | ORAL_TABLET | ORAL | Status: DC | PRN

## 2023-10-07 MED ORDER — AMIODARONE IV BOLUS ONLY 150 MG/100ML: 150.0000 mg | Freq: Once | INTRAVENOUS | Status: AC

## 2023-10-07 MED ORDER — MORPHINE SULFATE (PF) 2 MG/ML IV SOLN
2.0000 mg | INTRAVENOUS | Status: DC | PRN
  Administered 2023-10-07 (×2): 2 mg via INTRAVENOUS
  Filled 2023-10-07 (×2): qty 1

## 2023-10-07 MED ORDER — HEPARIN (PORCINE) 25000 UT/250ML-% IV SOLN
500.0000 [IU]/h | INTRAVENOUS | Status: DC
  Administered 2023-10-07 (×2): 450 [IU]/h via INTRAVENOUS
  Filled 2023-10-07: qty 250

## 2023-10-07 MED ORDER — AMIODARONE HCL IN DEXTROSE 360-4.14 MG/200ML-% IV SOLN
60.0000 mg/h | INTRAVENOUS | Status: DC
  Administered 2023-10-07 (×2): 60 mg/h via INTRAVENOUS
  Filled 2023-10-07: qty 200

## 2023-10-07 MED ORDER — ONDANSETRON HCL 4 MG/2ML IJ SOLN: 4.0000 mg | Freq: Four times a day (QID) | INTRAMUSCULAR | Status: DC | PRN

## 2023-10-07 MED ORDER — SODIUM CHLORIDE 0.9 % IV SOLN: 250.0000 mL | INTRAVENOUS | Status: DC

## 2023-10-07 MED ORDER — HALOPERIDOL 0.5 MG PO TABS: 0.5000 mg | ORAL_TABLET | ORAL | Status: DC | PRN

## 2023-10-07 MED ORDER — NOREPINEPHRINE 4 MG/250ML-% IV SOLN
2.0000 ug/min | INTRAVENOUS | Status: DC
  Administered 2023-10-07: 2 ug/min via INTRAVENOUS

## 2023-10-07 MED ORDER — OXYCODONE HCL 20 MG/ML PO CONC
5.0000 mg | ORAL | Status: DC | PRN
  Administered 2023-10-07 (×2): 5 mg via SUBLINGUAL
  Filled 2023-10-07: qty 0.5

## 2023-10-07 MED ORDER — ACETAMINOPHEN 650 MG RE SUPP: 650.0000 mg | Freq: Four times a day (QID) | RECTAL | Status: DC | PRN

## 2023-10-07 MED ORDER — GLYCOPYRROLATE 0.2 MG/ML IJ SOLN: 0.2000 mg | INTRAMUSCULAR | Status: DC | PRN

## 2023-10-07 MED ORDER — ACETAMINOPHEN 325 MG PO TABS: 650.0000 mg | ORAL_TABLET | Freq: Four times a day (QID) | ORAL | Status: DC | PRN

## 2023-10-07 MED ORDER — ONDANSETRON 4 MG PO TBDP: 4.0000 mg | ORAL_TABLET | Freq: Four times a day (QID) | ORAL | Status: DC | PRN

## 2023-10-07 MED ORDER — BIOTENE DRY MOUTH MT LIQD: 15.0000 mL | OROMUCOSAL | Status: DC | PRN

## 2023-10-07 MED ORDER — ACETAMINOPHEN 325 MG PO TABS
650.0000 mg | ORAL_TABLET | Freq: Four times a day (QID) | ORAL | Status: DC | PRN
  Administered 2023-10-07: 650 mg via ORAL
  Filled 2023-10-07: qty 2

## 2023-10-07 MED ORDER — POLYVINYL ALCOHOL 1.4 % OP SOLN: 1.0000 [drp] | Freq: Four times a day (QID) | OPHTHALMIC | Status: DC | PRN

## 2023-10-07 MED ORDER — AMIODARONE HCL IN DEXTROSE 360-4.14 MG/200ML-% IV SOLN
30.0000 mg/h | INTRAVENOUS | Status: DC
  Administered 2023-10-07 (×2): 30 mg/h via INTRAVENOUS
  Filled 2023-10-07: qty 200

## 2023-10-07 MED ORDER — LORAZEPAM 2 MG/ML IJ SOLN
1.0000 mg | INTRAMUSCULAR | Status: DC | PRN
  Administered 2023-10-08 (×2): 1 mg via INTRAVENOUS
  Filled 2023-10-07 (×2): qty 1

## 2023-10-07 MED ORDER — AMIODARONE IV BOLUS ONLY 150 MG/100ML
INTRAVENOUS | Status: AC
  Administered 2023-10-07: 150 mg via INTRAVENOUS
  Filled 2023-10-07: qty 100

## 2023-10-07 MED ORDER — OXYCODONE HCL 20 MG/ML PO CONC
5.0000 mg | ORAL | Status: DC | PRN
  Filled 2023-10-07: qty 0.5

## 2023-10-07 MED ORDER — GLYCOPYRROLATE 0.2 MG/ML IJ SOLN
0.2000 mg | INTRAMUSCULAR | Status: DC | PRN
  Administered 2023-10-07: 0.2 mg via INTRAVENOUS
  Filled 2023-10-07: qty 1

## 2023-10-07 MED ORDER — HYDROMORPHONE HCL 1 MG/ML IJ SOLN
0.5000 mg | INTRAMUSCULAR | Status: DC | PRN
  Administered 2023-10-07: 1 mg via INTRAVENOUS
  Administered 2023-10-08: 0.5 mg via INTRAVENOUS
  Administered 2023-10-08: 1 mg via INTRAVENOUS
  Filled 2023-10-07 (×3): qty 1

## 2023-10-07 MED ORDER — LORAZEPAM 2 MG/ML PO CONC
1.0000 mg | ORAL | Status: DC | PRN
  Administered 2023-10-07: 1 mg via SUBLINGUAL
  Filled 2023-10-07: qty 1

## 2023-10-07 MED ORDER — HYDROCORTISONE SOD SUC (PF) 100 MG IJ SOLR: 50.0000 mg | Freq: Four times a day (QID) | INTRAMUSCULAR | Status: DC

## 2023-10-07 NOTE — Progress Notes (Addendum)
PHARMACY CONSULT NOTE - FOLLOW UP  Pharmacy Consult for Electrolyte Monitoring and Replacement   Recent Labs: Potassium (mmol/L)  Date Value  10/07/2023 4.3  08/13/2014 4.0   Magnesium (mg/dL)  Date Value  40/34/7425 2.4   Calcium (mg/dL)  Date Value  95/63/8756 7.5 (L)   Calcium, Total (mg/dL)  Date Value  43/32/9518 8.6   Albumin (g/dL)  Date Value  84/16/6063 3.0 (L)  08/11/2014 3.5   Phosphorus (mg/dL)  Date Value  01/60/1093 6.2 (H)   Sodium (mmol/L)  Date Value  10/07/2023 132 (L)  08/13/2014 137   Assessment: 77 year old female admitted with sepsis, being admitted to CCU. Pharmacy has been consulted to manage electrolyte replacement. Pt was started on amiodarone. On LR @75  ml/hr.   Goal of Therapy:  K+ 4 Mg 2  Plan:  No replacement needed.  F/u with AM labs.   Ronnald Ramp, PharmD 10/07/2023 8:22 AM

## 2023-10-07 NOTE — Plan of Care (Signed)
Patient comfort care at this time Problem: Education: Goal: Knowledge of General Education information will improve Description: Including pain rating scale, medication(s)/side effects and non-pharmacologic comfort measures 10/07/2023 1611 by Aretta Nip, RN Outcome: Not Met (add Reason) 10/07/2023 1025 by Aretta Nip, RN Outcome: Not Progressing   Problem: Health Behavior/Discharge Planning: Goal: Ability to manage health-related needs will improve 10/07/2023 1611 by Aretta Nip, RN Outcome: Not Met (add Reason) 10/07/2023 1025 by Aretta Nip, RN Outcome: Not Progressing   Problem: Clinical Measurements: Goal: Ability to maintain clinical measurements within normal limits will improve 10/07/2023 1611 by Aretta Nip, RN Outcome: Not Met (add Reason) 10/07/2023 1025 by Aretta Nip, RN Outcome: Not Progressing Goal: Will remain free from infection 10/07/2023 1611 by Aretta Nip, RN Outcome: Not Met (add Reason) 10/07/2023 1025 by Aretta Nip, RN Outcome: Not Progressing Goal: Diagnostic test results will improve 10/07/2023 1611 by Aretta Nip, RN Outcome: Not Met (add Reason) 10/07/2023 1025 by Aretta Nip, RN Outcome: Not Progressing Goal: Respiratory complications will improve 10/07/2023 1611 by Aretta Nip, RN Outcome: Not Met (add Reason) 10/07/2023 1025 by Aretta Nip, RN Outcome: Not Progressing Goal: Cardiovascular complication will be avoided 10/07/2023 1611 by Aretta Nip, RN Outcome: Not Met (add Reason) 10/07/2023 1025 by Aretta Nip, RN Outcome: Not Progressing   Problem: Activity: Goal: Risk for activity intolerance will decrease 10/07/2023 1611 by Aretta Nip, RN Outcome: Not Met (add Reason) 10/07/2023 1025 by Aretta Nip, RN Outcome: Not Progressing   Problem: Nutrition: Goal: Adequate nutrition will be maintained 10/07/2023 1611 by Aretta Nip, RN Outcome: Not Met (add  Reason) 10/07/2023 1025 by Aretta Nip, RN Outcome: Not Progressing   Problem: Coping: Goal: Level of anxiety will decrease 10/07/2023 1611 by Aretta Nip, RN Outcome: Not Met (add Reason) 10/07/2023 1025 by Aretta Nip, RN Outcome: Not Progressing   Problem: Elimination: Goal: Will not experience complications related to bowel motility 10/07/2023 1611 by Aretta Nip, RN Outcome: Not Met (add Reason) 10/07/2023 1025 by Aretta Nip, RN Outcome: Not Progressing Goal: Will not experience complications related to urinary retention 10/07/2023 1611 by Aretta Nip, RN Outcome: Not Met (add Reason) 10/07/2023 1025 by Aretta Nip, RN Outcome: Not Progressing   Problem: Pain Managment: Goal: General experience of comfort will improve 10/07/2023 1611 by Aretta Nip, RN Outcome: Not Met (add Reason) 10/07/2023 1025 by Aretta Nip, RN Outcome: Not Progressing   Problem: Safety: Goal: Ability to remain free from injury will improve 10/07/2023 1611 by Aretta Nip, RN Outcome: Not Met (add Reason) 10/07/2023 1025 by Aretta Nip, RN Outcome: Not Progressing   Problem: Skin Integrity: Goal: Risk for impaired skin integrity will decrease 10/07/2023 1611 by Aretta Nip, RN Outcome: Not Met (add Reason) 10/07/2023 1025 by Aretta Nip, RN Outcome: Not Progressing   Problem: Education: Goal: Ability to describe self-care measures that may prevent or decrease complications (Diabetes Survival Skills Education) will improve 10/07/2023 1611 by Aretta Nip, RN Outcome: Not Met (add Reason) 10/07/2023 1025 by Aretta Nip, RN Outcome: Not Progressing Goal: Individualized Educational Video(s) 10/07/2023 1611 by Aretta Nip, RN Outcome: Not Met (add Reason) 10/07/2023 1025 by Aretta Nip, RN Outcome: Not Progressing   Problem: Coping: Goal: Ability to adjust to condition or change in health will improve 10/07/2023 1611 by  Aretta Nip, RN Outcome: Not Met (add  Reason) 10/07/2023 1025 by Aretta Nip, RN Outcome: Not Progressing   Problem: Fluid Volume: Goal: Ability to maintain a balanced intake and output will improve 10/07/2023 1611 by Aretta Nip, RN Outcome: Not Met (add Reason) 10/07/2023 1025 by Aretta Nip, RN Outcome: Not Progressing   Problem: Health Behavior/Discharge Planning: Goal: Ability to identify and utilize available resources and services will improve 10/07/2023 1611 by Aretta Nip, RN Outcome: Not Met (add Reason) 10/07/2023 1025 by Aretta Nip, RN Outcome: Not Progressing Goal: Ability to manage health-related needs will improve 10/07/2023 1611 by Aretta Nip, RN Outcome: Not Met (add Reason) 10/07/2023 1025 by Aretta Nip, RN Outcome: Not Progressing   Problem: Metabolic: Goal: Ability to maintain appropriate glucose levels will improve 10/07/2023 1611 by Aretta Nip, RN Outcome: Not Met (add Reason) 10/07/2023 1025 by Aretta Nip, RN Outcome: Not Progressing   Problem: Nutritional: Goal: Maintenance of adequate nutrition will improve 10/07/2023 1611 by Aretta Nip, RN Outcome: Not Met (add Reason) 10/07/2023 1025 by Aretta Nip, RN Outcome: Not Progressing Goal: Progress toward achieving an optimal weight will improve 10/07/2023 1611 by Aretta Nip, RN Outcome: Not Met (add Reason) 10/07/2023 1025 by Aretta Nip, RN Outcome: Not Progressing   Problem: Skin Integrity: Goal: Risk for impaired skin integrity will decrease 10/07/2023 1611 by Aretta Nip, RN Outcome: Not Met (add Reason) 10/07/2023 1025 by Aretta Nip, RN Outcome: Not Progressing   Problem: Tissue Perfusion: Goal: Adequacy of tissue perfusion will improve 10/07/2023 1611 by Aretta Nip, RN Outcome: Not Met (add Reason) 10/07/2023 1025 by Aretta Nip, RN Outcome: Not Progressing   Problem: Education: Goal: Knowledge of the  prescribed therapeutic regimen will improve Outcome: Not Met (add Reason)   Problem: Coping: Goal: Ability to identify and develop effective coping behavior will improve Outcome: Not Met (add Reason)   Problem: Clinical Measurements: Goal: Quality of life will improve Outcome: Not Met (add Reason)   Problem: Respiratory: Goal: Verbalizations of increased ease of respirations will increase Outcome: Not Met (add Reason)   Problem: Role Relationship: Goal: Family's ability to cope with current situation will improve Outcome: Not Met (add Reason) Goal: Ability to verbalize concerns, feelings, and thoughts to partner or family member will improve Outcome: Not Met (add Reason)   Problem: Pain Management: Goal: Satisfaction with pain management regimen will improve Outcome: Not Met (add Reason)

## 2023-10-07 NOTE — Progress Notes (Signed)
ANTICOAGULATION CONSULT NOTE  Pharmacy Consult for heparin infusion Indication: nSTEMI  Allergies  Allergen Reactions   Elemental Sulfur Rash   Sulfa Antibiotics Rash   Tricor [Fenofibrate] Rash   Patient Measurements: Height: 5\' 2"  (157.5 cm) Weight: 74.3 kg (163 lb 12.8 oz) IBW/kg (Calculated) : 50.1 Heparin Dosing Weight: 65.3 kg  Vital Signs: Temp: 97.8 F (36.6 C) (10/19 1109) Temp Source: Axillary (10/19 1109) BP: 98/50 (10/19 1215) Pulse Rate: 128 (10/19 1215)  Labs: Recent Labs    10/05/23 1727 10/05/23 2000 10/06/23 0333 10/06/23 0911 10/06/23 1126 10/06/23 1127 10/06/23 2232 10/07/23 0623 10/07/23 1130  HGB 7.9*  --  8.0*  --   --  7.9*  --  7.5*  --   HCT 25.0*  --  24.7*  --   --  25.9*  --  23.9*  --   PLT 59*  --  71*  --   --  76*  --  73*  --   APTT 56*  --   --   --  >200*  --  175*  --  66*  LABPROT 23.6*  --   --   --   --   --   --  23.8*  --   INR 2.1*  --   --   --   --   --   --  2.1*  --   HEPARINUNFRC  --   --   --   --  >1.10*  --   --   --  >1.10*  CREATININE 1.48*  --  1.51*  --   --   --   --  2.89*  --   CKTOTAL  --   --   --   --   --  54  --   --   --   CKMB  --   --   --   --   --  4.4  --   --   --   TROPONINIHS 36*   < > 600* 602*  --  594*  --   --   --    < > = values in this interval not displayed.    Estimated Creatinine Clearance: 15.4 mL/min (A) (by C-G formula based on SCr of 2.89 mg/dL (H)).   Medical History: Past Medical History:  Diagnosis Date   Anxiety    Chronic kidney disease    CKD3   Depression    Diabetes mellitus without complication (HCC)    Dyspnea    with episodes of pleurisy   Elevated lipids    Fibromyalgia    GERD (gastroesophageal reflux disease)    Headache    Hypertension    OA (osteoarthritis)    Pneumonia    has had several times. last time 2019   Sleep apnea    Medications:  PTA Meds: Apixaban 5 mg BID, last down unknown  Assessment: Pt is a 77 yo female presenting to ED with  hx of A. Fib on Eliquis, now with elevated troponin I level, trending up.  Both baseline INR and aPTT elevated. Currently dosing by aPTT  Goal of Therapy:  Heparin level 0.3-0.7 units/ml aPTT 66-102 seconds Monitor platelets by anticoagulation protocol: Yes  1018 1126 aPTT > 200/ HL > 1.10 - supratherapeutic  1018 2323 aPTT 175, supratherapeutic 1019 @ 1130 aPTT 66, therapeutic (HL > 1.10)  Plan:  Increase heparin infusion to 500 units/hr since aPTT at 66 and borderline therapeutic  Recheck aPTT in  8 hours and HL daily while on heparin  Continue to monitor H/H and CBC daily  Effie Shy, PharmD Pharmacy Resident  10/07/2023 12:25 PM

## 2023-10-07 NOTE — Progress Notes (Signed)
Paged per nurse. Patient entire family at bedside, family made decision to pull care. Offered prayer over pt and family. Family is struggling but at peace with decision and honoring their loved ones wishes.

## 2023-10-07 NOTE — Plan of Care (Signed)

## 2023-10-07 NOTE — Consult Note (Signed)
Consultation Note Date: 10/07/2023   Patient Name: Sara Mcdonald  DOB: June 13, 1946  MRN: [id number]  Age / Sex: 77 y.o., female  PCP: Danella Penton, MD Referring Physician: Erin Fulling, MD  Reason for Consultation: Establishing goals of care   HPI/Brief Hospital Course: 77 y.o. female  with past medical history of hypertension, hyperlipidemia, type 2 diabetes, OSA, and A-fib on Eliquis admitted from home on 09/29/2023 with shortness of breath and productive cough with symptoms persisting over the last 2 weeks.  Developed chest pain and chest tightness as well as chills and fever on 10/17 which prompted medical evaluation.  Noted from chart review she was recently seen by her PCP found to have a drop in hemoglobin as well as weight loss and most recently experiencing polydipsia.  On arrival to ED found to be hypoxic as well as A-fib RVR with heart rate in the 160s and hypotension.  Chest x-ray on 10/17 shows cardiomegaly with mild central congestion and small pleural effusions CTA negative for PE CT abdomen pelvis negative for acute process  Admitted to ICU with circulatory shock requiring peripheral vasopressors in the setting of CAP and HFpEF exacerbation  Oncology consulted as there was concern for circulating blasts in the peripheral blood high suspicion for leukemia-Hydrea has been started  Palliative medicine was consulted for assisting with goals of care conversations.  Subjective:  Extensive chart review has been completed prior to meeting patient including labs, vital signs, imaging, progress notes, orders, and available advanced directive documents from current and previous encounters.  Collaborated with ICU attending Dr. Belia Heman who had conversations with patient and family including daughters and son at bedside this a.m. who provided them with medical updates and his overall concern for poor prognosis.  He shared with me he had discussed  potential change in course of treatment to shifting towards comfort care and at this time family was speaking amongst themselves before making a final decision.  Notified by nursing staff family at bedside requesting to have conversations with palliative regarding next steps and transitioning to comfort care..  Met with Ms. Perrault at her bedside, she is lying in bed awake, alert, oriented with moments of confusion.  She is able to state she is aware she is in the hospital but unsure as to reasoning why she is here.  Daughter Judeth Cornfield and Clydie Braun and son Molly Maduro at bedside during my time of visit they all confirm conversations had with Dr. Belia Heman and together have decided to transition to comfort care.  Shared with family and patient that Ms. Fawley would no longer receive aggressive medical interventions such as continuous vital signs, lab work, radiology testing, or medications not focused on comfort. All care would focus on how the patient is looking and feeling. This would include management of any symptoms that may cause discomfort, pain, shortness of breath, cough, nausea, agitation, anxiety, and/or secretions etc. Symptoms would be managed with medications and other non-pharmacological interventions such as spiritual support if requested, repositioning, music therapy, or therapeutic listening. Family verbalized understanding and appreciation.  All family present and Ms. Simerson expressed their understanding and desire to transition to comfort care.  I discussed medications such as hydromorphone would be utilized as needed to ensure comfort prior to slowly weaning down vasopressor and oxygen support for which both patient and family agreed.  They requested time to allow extended family and chaplain's to be able to offer prayer prior to transitioning.  Notified by nursing staff that family was ready to  proceed with comfort care, appropriate orders have been placed.  All questions/concerns addressed.  Emotional support provided to patient/family/support persons. PMT will continue to follow and support patient as needed.  Objective: Primary Diagnoses: Present on Admission:  Shock circulatory [facility])   Physical Exam Constitutional:      General: She is not in acute distress.    Appearance: She is ill-appearing.  Cardiovascular:     Rate and Rhythm: Tachycardia present. Rhythm irregular.  Pulmonary:     Effort: Tachypnea present.  Skin:    General: Skin is warm and dry.  Neurological:     Mental Status: She is alert.     Motor: Weakness present.  Psychiatric:        Mood and Affect: Mood normal.        Behavior: Behavior normal.     Vital Signs: BP (!) 105/57   Pulse (!) 126   Temp 97.8 F (36.6 C) (Axillary)   Resp 20   Ht 5\' 2"  (1.575 m)   Wt 74.3 kg   SpO2 100%   BMI 29.96 kg/m  Pain Scale: 0-10 POSS *See Group Information*: S-Acceptable,Sleep, easy to arouse Pain Score: 5    IO: Intake/output summary:  Intake/Output Summary (Last 24 hours) at 10/07/2023 1503 Last data filed at 10/07/2023 1400 Gross per 24 hour  Intake 2946.22 ml  Output 220 ml  Net 2726.22 ml    LBM: Last BM Date : 10/04/23 Baseline Weight: Weight: 71.7 kg Most recent weight: Weight: 74.3 kg      Assessment and Plan  SUMMARY OF RECOMMENDATIONS   DNR/DNI/Comfort Oxycodone SL as needed for moderate pain, Hydromorphone IV PRN for severe pain, Robinul PRN for secretions, Haldol PRN for agitation/delirium, Lorazepam PRN anxiety PMT to continue to follow for ongoing needs and support  Palliative Prophylaxis:   Bowel Regimen, Delirium Protocol and Frequent Pain Assessment  Spiritual:  Desire for ongoing Chaplain support: Yes Education provided on Chaplain services offered through PMT, education provided on Grief/Bereavement support services   Discussed With: ICU attending and nursing staff   Thank you for this consult and allowing Palliative Medicine to participate in the care of  Odeth J. Berghuis. Palliative medicine will continue to follow and assist as needed.   Time Total: 75 minutes  Time spent includes: Detailed review of medical records (labs, imaging, vital signs), medically appropriate exam (mental status, respiratory, cardiac, skin), discussed with treatment team, counseling and educating patient, family and staff, documenting clinical information, medication management and coordination of care.   Signed by: Leeanne Deed, DNP, AGNP-C Palliative Medicine    Please contact Palliative Medicine Team phone at [phone] for questions and concerns.  For individual provider: See Loretha Stapler

## 2023-10-07 NOTE — Progress Notes (Addendum)
NAME:  Sara Mcdonald, MRN:  [id number], DOB:  [birth year], LOS: 2 ADMISSION DATE:  2023-10-17, CONSULTATION DATE:  10/17/2023 REFERRING MD:  [name], CHIEF COMPLAINT:  Shortness of Breath  History of Present Illness:  77 yo F presenting to [facility] ED from home via EMS on 17-Oct-2023 for evaluation of dyspnea.  History provided per chart review and bedside patient interview. Patient reports having increased shortness of breath from baseline over the last 2 weeks with correlating productive cough and yellow sputum. Over the last 3 days the dyspnea worsened and then on 2023/10/17 she started experiencing persistent 10/10 chest pain and tightness as well as a subjective chills and fever which she treated with Tylenol x 2. She denied urinary symptoms, abdominal pain/nausea/ vomiting, but did admit to one episode of loose stools- she denied any blood in her stool. She denies any tobacco use history, ETOH or recreational drug use. She reports taking all medication as prescribed with the exception of her lasix. Instead of taking this daily she reports to only taking it when she "feels wheezy". Of note patient was recently seen by PCP who noted drop in Hgb from 12.3 to 9.8, patient was started on iron supplementation with plans to check lab work in a few weeks. She had also been taken off her metformin after losing some weight, but it was noted on her last PCP visit that she has regained the lost weight and was experiencing polydipsia. Metformin was restarted.   EMS noted hypoxia at 80% and tachycardia on arrival. Patient was placed on a NRB during transport with improvement in Spo2 to 99%  ED course: Upon arrival patient alert and oriented with initial EKG shows A-fib RVR in the 160's and BP soft but stable. Patient received 500 mL bolus while awaiting lab work and she converted into NSR/low ST. Labs significant for leukocytosis without lactic acidosis, continued anemia, thrombocytopenia, mild NAGMA with close to  baseline kidney function, elevated BNP, LDH, troponin suggestive of demand ischemia, elevated PCT, D.Dimer, INR and fibrinogen. Imaging revealed small bilateral pleural effusions and possible left lower lobe pneumonia. BP dropped and peripheral levophed was started. Medications given: Fentanyl, morphine, K+, azithromycin & rocephin, 500 mL LR bolus, IV contrast, levophed drip started Initial Vitals: 98.3, 25, 144, 100/84 & 99% on RA Significant labs: (Labs/ Imaging personally reviewed) Chemistry: Na+:134, K+: 3.3, BUN/Cr.: 18/1.48, Serum CO2/ AG: 18/10, albumin: 3.0, T.Bili: 1.5 Hematology: WBC: 24.7, Hgb: 7.9, plt: 59  Troponin: 36 > 155, BNP: 505.4, Lactic/ PCT: 1.3 > 1.4/ 6.6, LDH: 258, D-Dimer: 1.01, Fibrinogen: 604, INR: 2.1  COVID-19 & Influenza A/B: negative  PCCM consulted for admission due to circulatory shock requiring peripheral vasopressor support in the setting of CAP & HFpEF exacerbation.  Pertinent  Medical History  CKD 3b HTN OSA Fibromyalgia Anxiety & Depression T2DM HLD Rheumatoid factor + IBS Atrial Fibrillation on Eliquis Significant Diagnostic Test  CXR 10/17/2023: Cardiomegaly with mild central congestion and probable small pleural effusions CT angio chest PE 10/17/2023: Negative for acute pulmonary embolus. Cardiomegaly with small left greater than right pleural effusions. Mild diffuse mosaicism which could be due to small airways disease. Low-grade edema could also be considered. CT abdomen/pelvis wo contrast 17-Oct-2023: No CT evidence for acute intra-abdominal or pelvic abnormality  Significant Hospital Events: Including procedures, antibiotic start and stop dates in addition to other pertinent events   10/17/2023: Admit to ICU with circulatory shock on peripheral vasopressors in the setting of suspected CAP & HFpEF exacerbation 10/06/23:off pressors,  active chest pain/SOB  10/07/23:Overnight became hypotensive, restarted low dose Levo went into Afib RVR, switched  Levo to neo. Amiodarone gtt started  Interim History / Subjective:  -see significant events above  Objective   Blood pressure (!) 100/57, pulse (!) 104, temperature 97.6 F (36.4 C), temperature source Oral, resp. rate (!) 27, height 5\' 2"  (1.575 m), weight 74.3 kg, SpO2 99%.    FiO2 (%):  [40 %] 40 %   Intake/Output Summary (Last 24 hours) at 10/07/2023 0631 Last data filed at 10/07/2023 0500 Gross per 24 hour  Intake 2866.07 ml  Output 385 ml  Net 2481.07 ml   Filed Weights   10/02/2023 1721 10/06/23 0500 10/07/23 0445  Weight: 71.7 kg 73.1 kg 74.3 kg    Examination: General: Adult female, acutely ill, lying in bed, NAD HEENT: MM pink/moist, anicteric, atraumatic, neck supple Neuro: A&O x 4, able to follow commands, PERRL +3, MAE CV: s1s2 irregular, NSR with PAC's on monitor, no r/m/g Pulm: Regular, non labored on RA, breath sounds diminished throughout GI: soft, rounded, non tender, bs x 4 Skin: no rashes/lesions noted Extremities: warm/dry, pulses + 2 R/P, no edema noted  Resolved Hospital Problem list     Assessment & Plan:  Leukocytosis without Lactic Acidosis suspect s/t CAP~IMPROVING Circulatory Shock- infectious vs cardiogenic Initial interventions/workup included: 500 L of NS/LR & Azithromycin / Ceftriaxone - f/u cultures, trend lactic/ PCT - Daily CBC, monitor WBC/ fever curve - IV antibiotics: ceftriaxone & azithromycin - Continue vasopressors to maintain MAP< 65: Neo  Acute on Chronic HFpEF exacerbation PAF on Eliquis now in RVR Elevated Troponin secondary to N-STEMI vs demand ischemia PMHx: HTN, HLD - Trend troponins >>. - hold outpatient lopressor & olmesartan due to hypotension > consider restarting as patient stabilizes - Echocardiogram ordered - f/u lipid panel, continue pravastatin - Start Amiodarone 150 mg IV bolus, then 1mg /min x6 hrs, then 0.5mg /min for 18 hours  - Daily weights to assess volume status - Diurese with the use of IV lasix as  hemodynamics and renal function allow - Cardiology consulted  CKD Stage 3b NAGMA Hypokalemia - Strict I/O's: alert provider if UOP < 0.5 mL/kg/hr - conservative IVF resuscitation - Daily BMP, replace electrolytes PRN - Avoid nephrotoxic agents as able, ensure adequate renal perfusion - K+ supplementation given  Microcytic Anemia New Thrombocytopenia s/t unknown etiology Pathologist smear review is pending, but initial reports normal platelet morphology -Ferritin 561 - Monitor for s/s of bleeding - Daily CBC, Monitor coag panel, f/u iron anemia panel - continue PPI BID - Transfuse for Hgb <7 & Consider transfusion of platelets if < 10 - Hematology consulted due to concerns for Leukemia?  OSA Patient has a CPAP ordered but has not been wearing it due to discomfort - CPAP ordered QHS - Supplemental oxygen as needed, to maintain SpO2 > 90%  Fibromyalgia Anxiety & Depression - continue Effexor & Xanax PRN - consider adding back additional pain control PRN depending on hemodynamics  Type 2 Diabetes Mellitus - Monitor CBG Q 4 hours - outpatient Metformin on hold - target range while in ICU: 140-180 - follow ICU hyper/hypo-glycemia protocol  Best Practice (right click and "Reselect all SmartList Selections" daily)  Diet/type: NPO w/ oral meds DVT prophylaxis: SCD GI prophylaxis: PPI Lines: N/A Foley:  N/A Code Status:  DNR Last date of multidisciplinary goals of care discussion [10/14/2023- discussion held bedside with family present including children and sister. Patient reported she had a living will confirming DNR/DNI status]  Labs   CBC: Recent Labs  Lab 07-Oct-2023 1727 10/06/23 0333 10/06/23 1127  WBC 24.7* 38.6* 47.2*  NEUTROABS 6.1  --   --   HGB 7.9* 8.0* 7.9*  HCT 25.0* 24.7* 25.9*  MCV 101.6* 98.8 104.0*  PLT 59* 71* 76*    Basic Metabolic Panel: Recent Labs  Lab 07-Oct-2023 1727 10/06/23 0333  NA 134* 136  K 3.3* 3.9  CL 106 108  CO2 18* 19*  GLUCOSE  183* 168*  BUN 18 18  CREATININE 1.48* 1.51*  CALCIUM 8.2* 7.9*  MG 1.7 1.8  PHOS 3.5 4.1   GFR: Estimated Creatinine Clearance: 29.5 mL/min (A) (by C-G formula based on SCr of 1.51 mg/dL (H)). Recent Labs  Lab 10-07-2023 1727 10/07/2023 2001 10/06/23 0333 10/06/23 1127  PROCALCITON 6.60  --  144.56  --   WBC 24.7*  --  38.6* 47.2*  LATICACIDVEN 1.3 1.4  --   --     Liver Function Tests: Recent Labs  Lab Oct 07, 2023 1727  AST 23  ALT 17  ALKPHOS 53  BILITOT 1.5*  PROT 5.6*  ALBUMIN 3.0*   No results for input(s): "LIPASE", "AMYLASE" in the last 168 hours. No results for input(s): "AMMONIA" in the last 168 hours.  ABG    Component Value Date/Time   PHART 7.37 10/06/2023 0404   PCO2ART 31 (L) 10/06/2023 0404   PO2ART 122 (H) 10/06/2023 0404   HCO3 17.9 (L) 10/06/2023 0404   ACIDBASEDEF 6.2 (H) 10/06/2023 0404   O2SAT 98.9 10/06/2023 0404     Coagulation Profile: Recent Labs  Lab Oct 07, 2023 1727  INR 2.1*    Cardiac Enzymes: Recent Labs  Lab 10/06/23 1127  CKTOTAL 54  CKMB 4.4    HbA1C: Hgb A1c MFr Bld  Date/Time Value Ref Range Status  10/06/2023 12:12 AM 6.3 (H) 4.8 - 5.6 % Final    Comment:    (NOTE) Pre diabetes:          5.7%-6.4%  Diabetes:              >6.4%  Glycemic control for   <7.0% adults with diabetes   07/18/2022 08:25 PM 6.2 (H) 4.8 - 5.6 % Final    Comment:    (NOTE) Pre diabetes:          5.7%-6.4%  Diabetes:              >6.4%  Glycemic control for   <7.0% adults with diabetes     CBG: Recent Labs  Lab 10/06/23 1129 10/06/23 1600 10/06/23 1935 10/06/23 2323 10/07/23 0351  GLUCAP 146* 218* 202* 156* 148*     Past Medical History:  She,  has a past medical history of Anxiety, Chronic kidney disease, Depression, Diabetes mellitus without complication (HCC), Dyspnea, Elevated lipids, Fibromyalgia, GERD (gastroesophageal reflux disease), Headache, Hypertension, OA (osteoarthritis), Pneumonia, and Sleep apnea.    Surgical History:   Past Surgical History:  Procedure Laterality Date   APPENDECTOMY     BIOPSY  07/29/2022   Procedure: BIOPSY;  Surgeon: Shellia Cleverly, DO;  Location: [facility] ENDOSCOPY;  Service: Gastroenterology;;   CHOLECYSTECTOMY     COLONOSCOPY WITH PROPOFOL N/A 07/07/2016   Procedure: COLONOSCOPY WITH PROPOFOL;  Surgeon: Scot Jun, MD;  Location: [facility] ENDOSCOPY;  Service: Endoscopy;  Laterality: N/A;   ESOPHAGOGASTRODUODENOSCOPY     EYE SURGERY  2013   bilateral cataract   FLEXIBLE SIGMOIDOSCOPY     FLEXIBLE SIGMOIDOSCOPY N/A 07/29/2022   Procedure: FLEXIBLE SIGMOIDOSCOPY;  Surgeon: Shellia Cleverly, DO;  Location[facility] ENDOSCOPY;  Service: Gastroenterology;  Laterality: N/A;   nanoflex Bilateral    POLYPECTOMY  07/29/2022   Procedure: POLYPECTOMY;  Surgeon: Shellia Cleverly, DO;  Location: [facility] ENDOSCOPY;  Service: Gastroenterology;;   TOTAL VAGINAL HYSTERECTOMY  1976     Social History:   reports that she has never smoked. She has never used smokeless tobacco. She reports that she does not drink alcohol and does not use drugs.   Family History:  Her family history includes Breast cancer (age of onset: 44) in her paternal aunt; CAD in her father; COPD in her father; CVA in her mother; Diabetes in her father; Hypertension in her mother.   Allergies Allergies  Allergen Reactions   Elemental Sulfur Rash   Sulfa Antibiotics Rash   Tricor [Fenofibrate] Rash     Home Medications  Prior to Admission medications   Medication Sig Start Date End Date Taking? Authorizing Provider  acetaminophen (TYLENOL) 500 MG tablet Take 2 tablets (1,000 mg total) by mouth 3 (three) times daily. 08/04/22   Rhetta Mura, MD  ALPRAZolam Prudy Feeler) 0.5 MG tablet Take 0.5 mg by mouth 2 (two) times daily as needed for anxiety or sleep.  01/11/17   [provider]  apixaban (ELIQUIS) 5 MG TABS tablet Take 1 tablet (5 mg total) by mouth 2 (two) times daily. 08/04/22   Rhetta Mura, MD  cholecalciferol (VITAMIN D3) 25 MCG (1000 UNIT) tablet Take 1,000 Units by mouth daily.    [provider]  cyclobenzaprine (FLEXERIL) 5 MG tablet Take 1 tablet (5 mg total) by mouth 3 (three) times daily as needed for muscle spasms. 07/20/22   Tressie Stalker, MD  docusate sodium (COLACE) 100 MG capsule Take 1 capsule (100 mg total) by mouth 2 (two) times daily. 07/20/22   Tressie Stalker, MD  furosemide (LASIX) 40 MG tablet Take 1 tablet (40 mg total) by mouth daily. 08/05/22   Rhetta Mura, MD  metFORMIN (GLUCOPHAGE) 500 MG tablet Take 500 mg by mouth daily.     [provider]  metoprolol tartrate (LOPRESSOR) 50 MG tablet Take 1 tablet (50 mg total) by mouth 2 (two) times daily. 08/04/22   Rhetta Mura, MD  Multiple Vitamin (MULTIVITAMIN WITH MINERALS) TABS tablet Take 1 tablet by mouth daily.    [provider]  nystatin-triamcinolone (MYCOLOG II) cream Apply 1 application  topically daily as needed (rash under breast). 07/26/18   [provider]  olmesartan (BENICAR) 20 MG tablet Take 20 mg by mouth daily. 07/26/20   [provider]  oxyCODONE (OXY IR/ROXICODONE) 5 MG immediate release tablet Take 1 tablet (5 mg total) by mouth every 6 (six) hours as needed for severe pain. 08/04/22   Rhetta Mura, MD  pantoprazole (PROTONIX) 40 MG tablet Take 40 mg by mouth 2 (two) times daily.     [provider]  polyethylene glycol powder (GLYCOLAX/MIRALAX) powder Take 17 g by mouth daily as needed for moderate constipation. 02/15/17   [provider]  pravastatin (PRAVACHOL) 80 MG tablet Take 80 mg by mouth daily.    [provider]  predniSONE (DELTASONE) 10 MG tablet Take 3 tablets (30 mg total) by mouth daily with breakfast. 08/05/22   Rhetta Mura, MD  promethazine (PHENERGAN) 25 MG tablet Take 25 mg by mouth every 6 (six) hours as needed for nausea or vomiting.    [provider]   sucralfate (CARAFATE) 1 g tablet Take 1 g by mouth  2 (two) times daily. 05/27/22   [provider]  topiramate (TOPAMAX) 100 MG tablet Take 100 mg by mouth at bedtime. 04/11/22   [provider]  traZODone (DESYREL) 100 MG tablet Take 100 mg by mouth at bedtime.    [provider]  venlafaxine XR (EFFEXOR-XR) 75 MG 24 hr capsule Take 75 mg by mouth daily. 08/24/20   [provider]   Scheduled Meds:  ALPRAZolam  0.5 mg Oral TID   budesonide (PULMICORT) nebulizer solution  0.5 mg Nebulization BID   Chlorhexidine Gluconate Cloth  6 each Topical Daily   hydroxyurea  1,000 mg Oral Daily   influenza vaccine adjuvanted  0.5 mL Intramuscular Tomorrow-1000   insulin aspart  1-3 Units Subcutaneous Q4H   ipratropium-albuterol  3 mL Nebulization Q4H   methylPREDNISolone (SOLU-MEDROL) injection  20 mg Intravenous Q12H   metoprolol succinate  25 mg Oral Daily   mouth rinse  15 mL Mouth Rinse 4 times per day   pantoprazole  40 mg Oral BID   pneumococcal 20-valent conjugate vaccine  0.5 mL Intramuscular Tomorrow-1000   pravastatin  80 mg Oral q1800   venlafaxine XR  75 mg Oral Daily   Continuous Infusions:  sodium chloride Stopped (10/07/23 0039)   sodium chloride Stopped (10/07/23 1324)   amiodarone 60 mg/hr (10/07/23 0625)   amiodarone     azithromycin Stopped (10/06/23 1847)   cefTRIAXone (ROCEPHIN)  IV Stopped (10/06/23 1739)   heparin 450 Units/hr (10/07/23 0600)   lactated ringers Stopped (10/07/23 0556)   norepinephrine (LEVOPHED) Adult infusion Stopped (10/07/23 0551)   phenylephrine (NEO-SYNEPHRINE) Adult infusion 25 mcg/min (10/07/23 0612)   PRN Meds:.acetaminophen, albuterol, docusate sodium, metoprolol tartrate, nitroGLYCERIN, ondansetron (ZOFRAN) IV, mouth rinse, polyethylene glycol, traMADol   Critical care time: 35 minutes     Webb Silversmith, DNP, CCRN, FNP-C, AGACNP-BC Acute Care & Family Nurse Practitioner  [facility]   See Amion for personal pager PCCM on call pager [phone] until 7 am

## 2023-10-07 NOTE — Progress Notes (Signed)
Peripheral levophed started after consulting with NP Webb Silversmith to keep MAP >65.

## 2023-10-07 NOTE — Progress Notes (Signed)
ANTICOAGULATION CONSULT NOTE  Pharmacy Consult for heparin infusion Indication: nSTEMI  Allergies  Allergen Reactions   Elemental Sulfur Rash   Sulfa Antibiotics Rash   Tricor [Fenofibrate] Rash   Patient Measurements: Height: 5\' 2"  (157.5 cm) Weight: 73.1 kg (161 lb 2.5 oz) IBW/kg (Calculated) : 50.1 Heparin Dosing Weight: 65.3 kg  Vital Signs: Temp: 98 F (36.7 C) (10/19 0000) Temp Source: Oral (10/19 0000) BP: 91/55 (10/19 0130) Pulse Rate: 96 (10/19 0130)  Labs: Recent Labs    10/05/23 1727 10/05/23 2000 10/06/23 0333 10/06/23 0911 10/06/23 1126 10/06/23 1127 10/06/23 2232  HGB 7.9*  --  8.0*  --   --  7.9*  --   HCT 25.0*  --  24.7*  --   --  25.9*  --   PLT 59*  --  71*  --   --  76*  --   APTT 56*  --   --   --  >200*  --  175*  LABPROT 23.6*  --   --   --   --   --   --   INR 2.1*  --   --   --   --   --   --   HEPARINUNFRC  --   --   --   --  >1.10*  --   --   CREATININE 1.48*  --  1.51*  --   --   --   --   CKTOTAL  --   --   --   --   --  54  --   CKMB  --   --   --   --   --  4.4  --   TROPONINIHS 36*   < > 600* 602*  --  594*  --    < > = values in this interval not displayed.    Estimated Creatinine Clearance: 29.2 mL/min (A) (by C-G formula based on SCr of 1.51 mg/dL (H)).   Medical History: Past Medical History:  Diagnosis Date   Anxiety    Chronic kidney disease    CKD3   Depression    Diabetes mellitus without complication (HCC)    Dyspnea    with episodes of pleurisy   Elevated lipids    Fibromyalgia    GERD (gastroesophageal reflux disease)    Headache    Hypertension    OA (osteoarthritis)    Pneumonia    has had several times. last time 2019   Sleep apnea    Medications:  PTA Meds: Apixaban 5 mg BID, last down unknown  Assessment: Pt is a 77 yo female presenting to ED with hx of A. Fib on Eliquis, now with elevated troponin I level, trending up.  Both baseline INR and aPTT elevated.  Goal of Therapy:  Heparin level  0.3-0.7 units/ml aPTT 66-102 seconds Monitor platelets by anticoagulation protocol: Yes  1018 1126 aPTT > 200/ HL > 1.10 - supratherapeutic  1018 2323 aPTT 175, supratherapeutic  Plan:  Hold heparin for 1 hour   Restart heparin infusion at 450 units/hr  Recheck aPTT 8 hours after restarting infusion and HL daily while on heparin  Continue to monitor H/H and CBC daily  Otelia Sergeant, PharmD, [facility] 10/07/2023 1:43 AM

## 2023-10-07 NOTE — Progress Notes (Signed)
[facility] CARDIOLOGY PROGRESS NOTE   Patient ID: [name] MRN: [id number] DOB/AGE: 06/04/1946 77 y.o.  Admit date: 10/05/2023 Referring Physician Dr. Erin Fulling Primary Physician Danella Penton, MD Primary Cardiologist Dr. Marcina Millard Reason for Consultation "Possible NSTEMI"   HPI: Sara Mcdonald is a 77 y.o. female  with a past medical history of paroxysmal atrial fibrillation, HFpEF (EF 65-70% with CHAD2DS2-VASc score of 5, Eliquis), HTN, HLD, T2DM, OSA who presented to the ED on 10/05/2023 for shortness of breath and chest pain. Cardiology was consulted for further evaluation.    Patient was seen in the ED on 10/17 for shortness of breath that has been occurring for past 3 days and chest pain for 1 day. Patient had associated fever/chills and productive cough with yellow sputum. Upon arrival to ED via EMS patient was hypoxic with SpO2 of 80%. Work up in the ED notable for CTA showed no evidence of pulmonary embolism. CXR showed mild central congestion and probable small pleural effusions. Na 134, K 3.3, Cr 1.48, Mg 1.7, GFR 36, BNP 505.4, LDH 258, Lactic 1.3, Procal 6.6, WBC 24.7, Hgb 7.9, Platelets 59. Peripheral smear shows circulating blasts. Fibrinogen 604. Troponin trended flat 36 > 155 > 445 > 496 > 600 > 602 > 594. EKG with atrial fibrillation at 163 bpm in the ED and patient received 500 mL bolus which converted her back into NSR. Patient received levophed for hypotension and started azithromycin and ceftriaxone.   Interval History:  -This morning she is slightly altered compared to yesterday. Her son and two daughters are at bedside. We discussed at length the goals of care and prognosis given her current clinical status. No plans for biopsy or cancer treatment given the severity of her illness and her body would not be able to handle it. Family would like to speak to palliative care team and proceed with comfort care measures at this time.   Review of systems  complete and found to be negative unless listed above    Vitals:   10/07/23 1200 10/07/23 1215 10/07/23 1230 10/07/23 1245  BP: 107/83 (!) 98/50 (!) 109/52 (!) 114/90  Pulse: (!) 134 (!) 128 (!) 144 (!) 122  Resp: (!) 21 16 (!) 22 20  Temp:      TempSrc:      SpO2: 100% 100% 99% 99%  Weight:      Height:         Intake/Output Summary (Last 24 hours) at 10/07/2023 1347 Last data filed at 10/07/2023 1300 Gross per 24 hour  Intake 2810.01 ml  Output 220 ml  Net 2590.01 ml     PHYSICAL EXAM General: ill-appearing and toxic-appearing elderly female, in acute distress laying flat in her hospital bed with family at bedside.  HEENT: Normocephalic and atraumatic. Neck: No JVD.  Lungs: Normal respiratory effort on HHFNC 4L. Wheezing and crackles bilaterally to auscultation. Heart: Tachycardic, Normal S1 and S2 without gallops or murmurs.  Abdomen: Non-distended appearing.  Msk: Normal strength and tone for age.  Extremities: Warm and well perfused. No clubbing, cyanosis. No edema.     LABS: Basic Metabolic Panel: Recent Labs    10/06/23 0333 10/07/23 0623  NA 136 132*  K 3.9 4.3  CL 108 104  CO2 19* 16*  GLUCOSE 168* 220*  BUN 18 41*  CREATININE 1.51* 2.89*  CALCIUM 7.9* 7.5*  MG 1.8 2.4  PHOS 4.1 6.2*   Liver Function Tests: Recent Labs    10/05/23 1727  AST 23  ALT 17  ALKPHOS 53  BILITOT 1.5*  PROT 5.6*  ALBUMIN 3.0*   No results for input(s): "LIPASE", "AMYLASE" in the last 72 hours. CBC: Recent Labs    10/05/23 1727 10/06/23 0333 10/06/23 1127 10/07/23 0623  WBC 24.7*   < > 47.2* 39.3*  NEUTROABS 6.1  --   --   --   HGB 7.9*   < > 7.9* 7.5*  HCT 25.0*   < > 25.9* 23.9*  MCV 101.6*   < > 104.0* 103.0*  PLT 59*   < > 76* 73*   < > = values in this interval not displayed.   Cardiac Enzymes: Recent Labs    10/06/23 0333 10/06/23 0911 10/06/23 1127  CKTOTAL  --   --  54  CKMB  --   --  4.4  TROPONINIHS 600* 602* 594*   BNP: Recent Labs     10/05/23 1727  BNP 505.4*   D-Dimer: Recent Labs    10/05/23 1727  DDIMER 1.01*   Hemoglobin A1C: Recent Labs    10/06/23 0012  HGBA1C 6.3*   Fasting Lipid Panel: Recent Labs    10/06/23 0333  CHOL 108  HDL 26*  LDLCALC 56  TRIG 161  CHOLHDL 4.2   Thyroid Function Tests: No results for input(s): "TSH", "T4TOTAL", "T3FREE", "THYROIDAB" in the last 72 hours.  Invalid input(s): "FREET3" Anemia Panel: Recent Labs    10/06/23 0333  VITAMINB12 755  FOLATE 23.0  FERRITIN 561*  TIBC 242*  IRON 38  RETICCTPCT 1.8    DG Chest 1 View  Result Date: 10/07/2023 CLINICAL DATA:  Shortness of breath EXAM: CHEST  1 VIEW COMPARISON:  10/06/2023 FINDINGS: Cardiac enlargement and vascular pedicle widening. Small left pleural effusion. Opacification in the retrocardiac lung primarily attributed to atelectasis and pleural fluid by recent CT. No pneumothorax. IMPRESSION: Unchanged retrocardiac opacity from atelectasis and pleural fluid by recent CT. Electronically Signed   By: Tiburcio Pea M.D.   On: 10/07/2023 06:19   ECHOCARDIOGRAM COMPLETE  Result Date: 10/06/2023    ECHOCARDIOGRAM REPORT   Patient Name:   Sara Mcdonald Date of Exam: 10/06/2023 Medical Rec #:  [id number]        Height:       62.0 in Accession #:    [id number]       Weight:       161.2 lb Date of Birth:  [birth year]        BSA:          1.744 m Patient Age:    77 years         BP:           136/59 mmHg Patient Gender: F                HR:           119 bpm. Exam Location:  [facility] Procedure: 2D Echo, Cardiac Doppler and Color Doppler Indications:     Elevated Troponin  History:         Patient has prior history of Echocardiogram examinations, most                  recent 07/30/2022. COPD, Arrythmias:Tachycardia; Risk                  Factors:Hypertension, Diabetes and Sleep Apnea.  Sonographer:     Mikki Harbor Referring Phys:  [id number] BRITTON L R[place]STER Diagnosing Phys: Rozell Searing Estefany Goebel IMPRESSIONS  1.  [facility] CARDIOLOGY PROGRESS NOTE   Patient ID: [name] MRN: [id number] DOB/AGE: 06/04/1946 77 y.o.  Admit date: 10/05/2023 Referring Physician Dr. Erin Fulling Primary Physician Danella Penton, MD Primary Cardiologist Dr. Marcina Millard Reason for Consultation "Possible NSTEMI"   HPI: Sara Mcdonald is a 77 y.o. female  with a past medical history of paroxysmal atrial fibrillation, HFpEF (EF 65-70% with CHAD2DS2-VASc score of 5, Eliquis), HTN, HLD, T2DM, OSA who presented to the ED on 10/05/2023 for shortness of breath and chest pain. Cardiology was consulted for further evaluation.    Patient was seen in the ED on 10/17 for shortness of breath that has been occurring for past 3 days and chest pain for 1 day. Patient had associated fever/chills and productive cough with yellow sputum. Upon arrival to ED via EMS patient was hypoxic with SpO2 of 80%. Work up in the ED notable for CTA showed no evidence of pulmonary embolism. CXR showed mild central congestion and probable small pleural effusions. Na 134, K 3.3, Cr 1.48, Mg 1.7, GFR 36, BNP 505.4, LDH 258, Lactic 1.3, Procal 6.6, WBC 24.7, Hgb 7.9, Platelets 59. Peripheral smear shows circulating blasts. Fibrinogen 604. Troponin trended flat 36 > 155 > 445 > 496 > 600 > 602 > 594. EKG with atrial fibrillation at 163 bpm in the ED and patient received 500 mL bolus which converted her back into NSR. Patient received levophed for hypotension and started azithromycin and ceftriaxone.   Interval History:  -This morning she is slightly altered compared to yesterday. Her son and two daughters are at bedside. We discussed at length the goals of care and prognosis given her current clinical status. No plans for biopsy or cancer treatment given the severity of her illness and her body would not be able to handle it. Family would like to speak to palliative care team and proceed with comfort care measures at this time.   Review of systems  complete and found to be negative unless listed above    Vitals:   10/07/23 1200 10/07/23 1215 10/07/23 1230 10/07/23 1245  BP: 107/83 (!) 98/50 (!) 109/52 (!) 114/90  Pulse: (!) 134 (!) 128 (!) 144 (!) 122  Resp: (!) 21 16 (!) 22 20  Temp:      TempSrc:      SpO2: 100% 100% 99% 99%  Weight:      Height:         Intake/Output Summary (Last 24 hours) at 10/07/2023 1347 Last data filed at 10/07/2023 1300 Gross per 24 hour  Intake 2810.01 ml  Output 220 ml  Net 2590.01 ml     PHYSICAL EXAM General: ill-appearing and toxic-appearing elderly female, in acute distress laying flat in her hospital bed with family at bedside.  HEENT: Normocephalic and atraumatic. Neck: No JVD.  Lungs: Normal respiratory effort on HHFNC 4L. Wheezing and crackles bilaterally to auscultation. Heart: Tachycardic, Normal S1 and S2 without gallops or murmurs.  Abdomen: Non-distended appearing.  Msk: Normal strength and tone for age.  Extremities: Warm and well perfused. No clubbing, cyanosis. No edema.     LABS: Basic Metabolic Panel: Recent Labs    10/06/23 0333 10/07/23 0623  NA 136 132*  K 3.9 4.3  CL 108 104  CO2 19* 16*  GLUCOSE 168* 220*  BUN 18 41*  CREATININE 1.51* 2.89*  CALCIUM 7.9* 7.5*  MG 1.8 2.4  PHOS 4.1 6.2*   Liver Function Tests: Recent Labs    10/05/23 1727  [facility] CARDIOLOGY PROGRESS NOTE   Patient ID: [name] MRN: [id number] DOB/AGE: 06/04/1946 77 y.o.  Admit date: 10/05/2023 Referring Physician Dr. Erin Fulling Primary Physician Danella Penton, MD Primary Cardiologist Dr. Marcina Millard Reason for Consultation "Possible NSTEMI"   HPI: Sara Mcdonald is a 77 y.o. female  with a past medical history of paroxysmal atrial fibrillation, HFpEF (EF 65-70% with CHAD2DS2-VASc score of 5, Eliquis), HTN, HLD, T2DM, OSA who presented to the ED on 10/05/2023 for shortness of breath and chest pain. Cardiology was consulted for further evaluation.    Patient was seen in the ED on 10/17 for shortness of breath that has been occurring for past 3 days and chest pain for 1 day. Patient had associated fever/chills and productive cough with yellow sputum. Upon arrival to ED via EMS patient was hypoxic with SpO2 of 80%. Work up in the ED notable for CTA showed no evidence of pulmonary embolism. CXR showed mild central congestion and probable small pleural effusions. Na 134, K 3.3, Cr 1.48, Mg 1.7, GFR 36, BNP 505.4, LDH 258, Lactic 1.3, Procal 6.6, WBC 24.7, Hgb 7.9, Platelets 59. Peripheral smear shows circulating blasts. Fibrinogen 604. Troponin trended flat 36 > 155 > 445 > 496 > 600 > 602 > 594. EKG with atrial fibrillation at 163 bpm in the ED and patient received 500 mL bolus which converted her back into NSR. Patient received levophed for hypotension and started azithromycin and ceftriaxone.   Interval History:  -This morning she is slightly altered compared to yesterday. Her son and two daughters are at bedside. We discussed at length the goals of care and prognosis given her current clinical status. No plans for biopsy or cancer treatment given the severity of her illness and her body would not be able to handle it. Family would like to speak to palliative care team and proceed with comfort care measures at this time.   Review of systems  complete and found to be negative unless listed above    Vitals:   10/07/23 1200 10/07/23 1215 10/07/23 1230 10/07/23 1245  BP: 107/83 (!) 98/50 (!) 109/52 (!) 114/90  Pulse: (!) 134 (!) 128 (!) 144 (!) 122  Resp: (!) 21 16 (!) 22 20  Temp:      TempSrc:      SpO2: 100% 100% 99% 99%  Weight:      Height:         Intake/Output Summary (Last 24 hours) at 10/07/2023 1347 Last data filed at 10/07/2023 1300 Gross per 24 hour  Intake 2810.01 ml  Output 220 ml  Net 2590.01 ml     PHYSICAL EXAM General: ill-appearing and toxic-appearing elderly female, in acute distress laying flat in her hospital bed with family at bedside.  HEENT: Normocephalic and atraumatic. Neck: No JVD.  Lungs: Normal respiratory effort on HHFNC 4L. Wheezing and crackles bilaterally to auscultation. Heart: Tachycardic, Normal S1 and S2 without gallops or murmurs.  Abdomen: Non-distended appearing.  Msk: Normal strength and tone for age.  Extremities: Warm and well perfused. No clubbing, cyanosis. No edema.     LABS: Basic Metabolic Panel: Recent Labs    10/06/23 0333 10/07/23 0623  NA 136 132*  K 3.9 4.3  CL 108 104  CO2 19* 16*  GLUCOSE 168* 220*  BUN 18 41*  CREATININE 1.51* 2.89*  CALCIUM 7.9* 7.5*  MG 1.8 2.4  PHOS 4.1 6.2*   Liver Function Tests: Recent Labs    10/05/23 1727  [facility] CARDIOLOGY PROGRESS NOTE   Patient ID: [name] MRN: [id number] DOB/AGE: 06/04/1946 77 y.o.  Admit date: 10/05/2023 Referring Physician Dr. Erin Fulling Primary Physician Danella Penton, MD Primary Cardiologist Dr. Marcina Millard Reason for Consultation "Possible NSTEMI"   HPI: Sara Mcdonald is a 77 y.o. female  with a past medical history of paroxysmal atrial fibrillation, HFpEF (EF 65-70% with CHAD2DS2-VASc score of 5, Eliquis), HTN, HLD, T2DM, OSA who presented to the ED on 10/05/2023 for shortness of breath and chest pain. Cardiology was consulted for further evaluation.    Patient was seen in the ED on 10/17 for shortness of breath that has been occurring for past 3 days and chest pain for 1 day. Patient had associated fever/chills and productive cough with yellow sputum. Upon arrival to ED via EMS patient was hypoxic with SpO2 of 80%. Work up in the ED notable for CTA showed no evidence of pulmonary embolism. CXR showed mild central congestion and probable small pleural effusions. Na 134, K 3.3, Cr 1.48, Mg 1.7, GFR 36, BNP 505.4, LDH 258, Lactic 1.3, Procal 6.6, WBC 24.7, Hgb 7.9, Platelets 59. Peripheral smear shows circulating blasts. Fibrinogen 604. Troponin trended flat 36 > 155 > 445 > 496 > 600 > 602 > 594. EKG with atrial fibrillation at 163 bpm in the ED and patient received 500 mL bolus which converted her back into NSR. Patient received levophed for hypotension and started azithromycin and ceftriaxone.   Interval History:  -This morning she is slightly altered compared to yesterday. Her son and two daughters are at bedside. We discussed at length the goals of care and prognosis given her current clinical status. No plans for biopsy or cancer treatment given the severity of her illness and her body would not be able to handle it. Family would like to speak to palliative care team and proceed with comfort care measures at this time.   Review of systems  complete and found to be negative unless listed above    Vitals:   10/07/23 1200 10/07/23 1215 10/07/23 1230 10/07/23 1245  BP: 107/83 (!) 98/50 (!) 109/52 (!) 114/90  Pulse: (!) 134 (!) 128 (!) 144 (!) 122  Resp: (!) 21 16 (!) 22 20  Temp:      TempSrc:      SpO2: 100% 100% 99% 99%  Weight:      Height:         Intake/Output Summary (Last 24 hours) at 10/07/2023 1347 Last data filed at 10/07/2023 1300 Gross per 24 hour  Intake 2810.01 ml  Output 220 ml  Net 2590.01 ml     PHYSICAL EXAM General: ill-appearing and toxic-appearing elderly female, in acute distress laying flat in her hospital bed with family at bedside.  HEENT: Normocephalic and atraumatic. Neck: No JVD.  Lungs: Normal respiratory effort on HHFNC 4L. Wheezing and crackles bilaterally to auscultation. Heart: Tachycardic, Normal S1 and S2 without gallops or murmurs.  Abdomen: Non-distended appearing.  Msk: Normal strength and tone for age.  Extremities: Warm and well perfused. No clubbing, cyanosis. No edema.     LABS: Basic Metabolic Panel: Recent Labs    10/06/23 0333 10/07/23 0623  NA 136 132*  K 3.9 4.3  CL 108 104  CO2 19* 16*  GLUCOSE 168* 220*  BUN 18 41*  CREATININE 1.51* 2.89*  CALCIUM 7.9* 7.5*  MG 1.8 2.4  PHOS 4.1 6.2*   Liver Function Tests: Recent Labs    10/05/23 1727  [facility] CARDIOLOGY PROGRESS NOTE   Patient ID: [name] MRN: [id number] DOB/AGE: 06/04/1946 77 y.o.  Admit date: 10/05/2023 Referring Physician Dr. Erin Fulling Primary Physician Danella Penton, MD Primary Cardiologist Dr. Marcina Millard Reason for Consultation "Possible NSTEMI"   HPI: Sara Mcdonald is a 77 y.o. female  with a past medical history of paroxysmal atrial fibrillation, HFpEF (EF 65-70% with CHAD2DS2-VASc score of 5, Eliquis), HTN, HLD, T2DM, OSA who presented to the ED on 10/05/2023 for shortness of breath and chest pain. Cardiology was consulted for further evaluation.    Patient was seen in the ED on 10/17 for shortness of breath that has been occurring for past 3 days and chest pain for 1 day. Patient had associated fever/chills and productive cough with yellow sputum. Upon arrival to ED via EMS patient was hypoxic with SpO2 of 80%. Work up in the ED notable for CTA showed no evidence of pulmonary embolism. CXR showed mild central congestion and probable small pleural effusions. Na 134, K 3.3, Cr 1.48, Mg 1.7, GFR 36, BNP 505.4, LDH 258, Lactic 1.3, Procal 6.6, WBC 24.7, Hgb 7.9, Platelets 59. Peripheral smear shows circulating blasts. Fibrinogen 604. Troponin trended flat 36 > 155 > 445 > 496 > 600 > 602 > 594. EKG with atrial fibrillation at 163 bpm in the ED and patient received 500 mL bolus which converted her back into NSR. Patient received levophed for hypotension and started azithromycin and ceftriaxone.   Interval History:  -This morning she is slightly altered compared to yesterday. Her son and two daughters are at bedside. We discussed at length the goals of care and prognosis given her current clinical status. No plans for biopsy or cancer treatment given the severity of her illness and her body would not be able to handle it. Family would like to speak to palliative care team and proceed with comfort care measures at this time.   Review of systems  complete and found to be negative unless listed above    Vitals:   10/07/23 1200 10/07/23 1215 10/07/23 1230 10/07/23 1245  BP: 107/83 (!) 98/50 (!) 109/52 (!) 114/90  Pulse: (!) 134 (!) 128 (!) 144 (!) 122  Resp: (!) 21 16 (!) 22 20  Temp:      TempSrc:      SpO2: 100% 100% 99% 99%  Weight:      Height:         Intake/Output Summary (Last 24 hours) at 10/07/2023 1347 Last data filed at 10/07/2023 1300 Gross per 24 hour  Intake 2810.01 ml  Output 220 ml  Net 2590.01 ml     PHYSICAL EXAM General: ill-appearing and toxic-appearing elderly female, in acute distress laying flat in her hospital bed with family at bedside.  HEENT: Normocephalic and atraumatic. Neck: No JVD.  Lungs: Normal respiratory effort on HHFNC 4L. Wheezing and crackles bilaterally to auscultation. Heart: Tachycardic, Normal S1 and S2 without gallops or murmurs.  Abdomen: Non-distended appearing.  Msk: Normal strength and tone for age.  Extremities: Warm and well perfused. No clubbing, cyanosis. No edema.     LABS: Basic Metabolic Panel: Recent Labs    10/06/23 0333 10/07/23 0623  NA 136 132*  K 3.9 4.3  CL 108 104  CO2 19* 16*  GLUCOSE 168* 220*  BUN 18 41*  CREATININE 1.51* 2.89*  CALCIUM 7.9* 7.5*  MG 1.8 2.4  PHOS 4.1 6.2*   Liver Function Tests: Recent Labs    10/05/23 1727

## 2023-10-07 NOTE — Plan of Care (Signed)
  Problem: Education: Goal: Knowledge of General Education information will improve Description: Including pain rating scale, medication(s)/side effects and non-pharmacologic comfort measures Outcome: Progressing   Problem: Health Behavior/Discharge Planning: Goal: Ability to manage health-related needs will improve Outcome: Progressing   Problem: Clinical Measurements: Goal: Will remain free from infection Outcome: Progressing Goal: Diagnostic test results will improve Outcome: Progressing   Problem: Nutrition: Goal: Adequate nutrition will be maintained Outcome: Progressing   Problem: Coping: Goal: Level of anxiety will decrease Outcome: Progressing   Problem: Elimination: Goal: Will not experience complications related to bowel motility Outcome: Progressing Goal: Will not experience complications related to urinary retention Outcome: Progressing   Problem: Pain Managment: Goal: General experience of comfort will improve Outcome: Progressing   Problem: Safety: Goal: Ability to remain free from injury will improve Outcome: Progressing   Problem: Skin Integrity: Goal: Risk for impaired skin integrity will decrease Outcome: Progressing   Problem: Education: Goal: Ability to describe self-care measures that may prevent or decrease complications (Diabetes Survival Skills Education) will improve Outcome: Progressing Goal: Individualized Educational Video(s) Outcome: Progressing   Problem: Coping: Goal: Ability to adjust to condition or change in health will improve Outcome: Progressing   Problem: Health Behavior/Discharge Planning: Goal: Ability to identify and utilize available resources and services will improve Outcome: Progressing Goal: Ability to manage health-related needs will improve Outcome: Progressing   Problem: Metabolic: Goal: Ability to maintain appropriate glucose levels will improve Outcome: Progressing   Problem: Nutritional: Goal:  Maintenance of adequate nutrition will improve Outcome: Progressing Goal: Progress toward achieving an optimal weight will improve Outcome: Progressing   Problem: Skin Integrity: Goal: Risk for impaired skin integrity will decrease Outcome: Progressing   Problem: Tissue Perfusion: Goal: Adequacy of tissue perfusion will improve Outcome: Progressing   Problem: Clinical Measurements: Goal: Ability to maintain clinical measurements within normal limits will improve Outcome: Not Progressing Goal: Respiratory complications will improve Outcome: Not Progressing Goal: Cardiovascular complication will be avoided Outcome: Not Progressing   Problem: Activity: Goal: Risk for activity intolerance will decrease Outcome: Not Progressing   Problem: Fluid Volume: Goal: Ability to maintain a balanced intake and output will improve Outcome: Not Progressing

## 2023-10-07 NOTE — Progress Notes (Addendum)
0900 family at bedside patient alert x4 on oxygen amio and pressors 1000  family meet with CC DR they are going to go comfort after all family visits patient aware and agreeable  1445 chaplin at bedside with all family members plan is to stop medications after prayer and go full comfort. 1508 comfort measures started IV meds stopped pressors cut down patient alert verbalizes understanding 1519 all pressors off all family at bedside with patient

## 2023-10-08 DIAGNOSIS — I959 Hypotension, unspecified: Secondary | ICD-10-CM | POA: Diagnosis not present

## 2023-10-08 DIAGNOSIS — R579 Shock, unspecified: Secondary | ICD-10-CM | POA: Diagnosis not present

## 2023-10-08 DIAGNOSIS — I5023 Acute on chronic systolic (congestive) heart failure: Secondary | ICD-10-CM | POA: Diagnosis not present

## 2023-10-08 LAB — LEGIONELLA PNEUMOPHILA SEROGP 1 UR AG: L. pneumophila Serogp 1 Ur Ag: NEGATIVE

## 2023-10-08 MED ORDER — HYDROMORPHONE BOLUS VIA INFUSION
0.5000 mg | INTRAVENOUS | Status: DC | PRN
  Administered 2023-10-08 (×2): 0.5 mg via INTRAVENOUS

## 2023-10-08 MED ORDER — HYDROMORPHONE HCL-NACL 50-0.9 MG/50ML-% IV SOLN
0.5000 mg/h | INTRAVENOUS | Status: DC
Start: 2023-10-08 — End: 2023-10-08
  Administered 2023-10-08: 1 mg/h via INTRAVENOUS
  Filled 2023-10-08: qty 50

## 2023-10-10 LAB — CULTURE, BLOOD (ROUTINE X 2)
Culture: NO GROWTH
Culture: NO GROWTH
Special Requests: ADEQUATE
Special Requests: ADEQUATE

## 2023-10-10 LAB — COMP PANEL: LEUKEMIA/LYMPHOMA: Immunophenotypic Profile: 9

## 2023-10-20 NOTE — Hospital Course (Addendum)
ICU transfer for 2023/11/05.   Sara Mcdonald is a 77 y.o. female  with a past medical history of paroxysmal atrial fibrillation, HFpEF (EF 65-70% with CHAD2DS2-VASc score of 5, Eliquis), HTN, HLD, T2DM, OSA who presented to the ED on 10/05/2023 for shortness of breath and chest pain.  Patient also has some associated fever, chills and productive cough with yellow sputum.  On arrival to ED she was hypoxic initially requiring nonrebreather.  CTA was negative for PE, concern of left lower lobe infiltrate and some pulmonary congestion, small bilateral pleural effusion.  Labs pertinent for Na 134, K 3.3, Cr 1.48, Mg 1.7, GFR 36, BNP 505.4, LDH 258, Lactic 1.3, Procal 6.6, WBC 24.7, Hgb 7.9, Platelets 59. Peripheral smear shows circulating blasts. Fibrinogen 604. Troponin trended flat 36 > 155 > 445 > 496 > 600 > 602 > 594.  Patient was later started on IV pressors for concern of circulatory shock and admitted to ICU. Started on antibiotics for pneumonia.  COVID-19, influenza negative.  CT abdomen and pelvis was negative for any acute intra-abdominal or pelvic abnormality  Initially able to wean off from pressors the next day but required to go back on them again overnight, also developed A-fib with RVR and started on amiodarone infusion, cardiology was also consulted.  Oncology was consulted for concern of circulating blast and worsening leukocytosis, started on Hydrea 1 g daily.  It was seem unlikely to have leukostasis as WBC peaked at 47,000, that reaction is mostly seen with counts more than 100,000.  Concern of acute myeloid leukemia, flow cytometry was sent and plan was to do a bone marrow biopsy once her condition stabilizes. Per oncology note if the acute myeloid leukemia diagnosis confirmed she might be a candidate for hypomethylating agent.  She will not be a candidate for intensive induction chemotherapy or bone marrow transplant based on her significant underlying comorbidities. Based on that  she had poor prognosis, if decided to proceed with hospice then estimated life expectancy days to weeks and even with that treatment maximum up to a year.  Patient continued to deteriorate requiring more pressors.  Palliative care was also consulted and family decided to withdraw medical treatment and proceed with full comfort care.  Care was transferred to [facility] on 11/05/23.  November 05, 2023: Patient was started on Dilaudid infusion at family request this morning.  She was mostly unresponsive with some intermittent apnea when seen during morning rounds.  Later around 11:30 AM she developed agonal breathing and then died with the presence of multiple family members at bedside.  She was pronounced dead at 11:47 AM.

## 2023-10-20 NOTE — Progress Notes (Signed)
2023-11-07 1200  Spiritual Encounters  Type of Visit Initial  Care provided to: Family  Referral source Nurse (RN/NT/LPN);Family  Reason for visit End-of-life  OnCall Visit Yes  Spiritual Framework  Presenting Themes Impactful experiences and emotions  Family Stress Factors Loss  Interventions  Spiritual Care Interventions Made Compassionate presence;Prayer;Bereavement/grief support   Chaplain responded to call to provide end-of-life support to family of patient.

## 2023-10-20 NOTE — Plan of Care (Signed)
  Problem: Education: Goal: Knowledge of General Education information will improve Description: Including pain rating scale, medication(s)/side effects and non-pharmacologic comfort measures Outcome: Not Met (add Reason)   Problem: Health Behavior/Discharge Planning: Goal: Ability to manage health-related needs will improve Outcome: Not Met (add Reason)   Problem: Clinical Measurements: Goal: Ability to maintain clinical measurements within normal limits will improve Outcome: Not Met (add Reason) Goal: Will remain free from infection Outcome: Not Met (add Reason) Goal: Diagnostic test results will improve Outcome: Not Met (add Reason) Goal: Respiratory complications will improve Outcome: Not Met (add Reason) Goal: Cardiovascular complication will be avoided Outcome: Not Met (add Reason)   Problem: Activity: Goal: Risk for activity intolerance will decrease Outcome: Not Met (add Reason)   Problem: Nutrition: Goal: Adequate nutrition will be maintained Outcome: Not Met (add Reason)   Problem: Coping: Goal: Level of anxiety will decrease Outcome: Not Met (add Reason)   Problem: Elimination: Goal: Will not experience complications related to bowel motility Outcome: Not Met (add Reason) Goal: Will not experience complications related to urinary retention Outcome: Not Met (add Reason)   Problem: Pain Managment: Goal: General experience of comfort will improve Outcome: Not Met (add Reason)   Problem: Safety: Goal: Ability to remain free from injury will improve Outcome: Not Met (add Reason)   Problem: Skin Integrity: Goal: Risk for impaired skin integrity will decrease Outcome: Not Met (add Reason)   Problem: Education: Goal: Ability to describe self-care measures that may prevent or decrease complications (Diabetes Survival Skills Education) will improve Outcome: Not Met (add Reason) Goal: Individualized Educational Video(s) Outcome: Not Met (add Reason)   Problem:  Coping: Goal: Ability to adjust to condition or change in health will improve Outcome: Not Met (add Reason)   Problem: Fluid Volume: Goal: Ability to maintain a balanced intake and output will improve Outcome: Not Met (add Reason)   Problem: Health Behavior/Discharge Planning: Goal: Ability to identify and utilize available resources and services will improve Outcome: Not Met (add Reason) Goal: Ability to manage health-related needs will improve Outcome: Not Met (add Reason)   Problem: Metabolic: Goal: Ability to maintain appropriate glucose levels will improve Outcome: Not Met (add Reason)   Problem: Nutritional: Goal: Maintenance of adequate nutrition will improve Outcome: Not Met (add Reason) Goal: Progress toward achieving an optimal weight will improve Outcome: Not Met (add Reason)   Problem: Skin Integrity: Goal: Risk for impaired skin integrity will decrease Outcome: Not Met (add Reason)   Problem: Tissue Perfusion: Goal: Adequacy of tissue perfusion will improve Outcome: Not Met (add Reason)   Problem: Education: Goal: Knowledge of the prescribed therapeutic regimen will improve Outcome: Not Met (add Reason)   Problem: Coping: Goal: Ability to identify and develop effective coping behavior will improve Outcome: Not Met (add Reason)   Problem: Clinical Measurements: Goal: Quality of life will improve Outcome: Not Met (add Reason)   Problem: Respiratory: Goal: Verbalizations of increased ease of respirations will increase Outcome: Not Met (add Reason)   Problem: Role Relationship: Goal: Family's ability to cope with current situation will improve Outcome: Not Met (add Reason) Goal: Ability to verbalize concerns, feelings, and thoughts to partner or family member will improve Outcome: Not Met (add Reason)   Problem: Pain Management: Goal: Satisfaction with pain management regimen will improve Outcome: Not Met (add Reason)  Comfort Measures - Patient  pronounced at 1147

## 2023-10-20 NOTE — Progress Notes (Signed)
Daily Progress Note   Patient Name: Sara Mcdonald       Date: Nov 04, 2023 DOB: 12/02/1946  Age: 77 y.o. MRN#: [id number] Attending Physician: No att. providers found Primary Care Physician: Sara Penton, MD Admit Date: 10/03/2023  Reason for Consultation/Follow-up: Establishing goals of care  HPI/Brief Hospital Review: 77 y.o. female  with past medical history of hypertension, hyperlipidemia, type 2 diabetes, OSA, and A-fib on Eliquis admitted from home on 09/25/2023 with shortness of breath and productive cough with symptoms persisting over the last 2 weeks.  Developed chest pain and chest tightness as well as chills and fever on 10/17 which prompted medical evaluation.   Noted from chart review she was recently seen by her PCP found to have a drop in hemoglobin as well as weight loss and most recently experiencing polydipsia.   On arrival to ED found to be hypoxic as well as A-fib RVR with heart rate in the 160s and hypotension.   Chest x-ray on 10/17 shows cardiomegaly with mild central congestion and small pleural effusions CTA negative for PE CT abdomen pelvis negative for acute process   Admitted to ICU with circulatory shock requiring peripheral vasopressors in the setting of CAP and HFpEF exacerbation   Oncology consulted as there was concern for circulating blasts in the peripheral blood high suspicion for leukemia-Hydrea has been started  Decision made 10/19 to transition to full comfort measures   Palliative medicine was consulted for assisting with goals of care conversations.  Subjective: Extensive chart review has been completed prior to meeting patient including labs, vital signs, imaging, progress notes, orders, and available advanced directive documents from current  and previous encounters.    Requested by nursing staff to round at bedside as family had questions.  Arrived at bedside daughters present with questions regarding difference between hydromorphone and morphine.  Explained the differences as well as similarities but the risk of neurotoxicity associated with morphine use with elevated creatinine levels.  Family also requesting continuous IV infusion of Dilaudid as they feel this would maintain Sara Mcdonald pain and discomfort.  On assessment Sara Mcdonald in bed with eyes closed mild tachypnea with increased work of breathing.  Notified nursing staff of change to transition to IV continuous Dilaudid infusion with a low threshold to increase infusion as needed after administering boluses  as instructed in order to maintain comfort.  Requested to return to bedside by nursing staff as family had questions regarding hospice home placement.  On return to bedside Sara Mcdonald with increased work of breathing with mixed shallow and abnormal breathing pattern.  Explained to the family present this type of breathing pattern likely in alignment that Sara Mcdonald transitioning to end-of-life.  Shared with him the risk of her passing weight and transport if hospice home remains of interest to them.  Family requested I seek hospice evaluation for IPU.  Notified soon after of Sara Mcdonald's passing.  Thank you for allowing the Palliative Medicine Team to assist in the care of this patient.  Total time:  35 minutes  Time spent includes: Detailed review of medical records (labs, imaging, vital signs), medically appropriate exam (mental status, respiratory, cardiac, skin), discussed with treatment team, counseling and educating patient, family and staff, documenting clinical information, medication management and coordination of care.  Sara Deed, DNP, AGNP-C Palliative Medicine   Please contact Palliative Medicine Team phone at [phone] for questions and concerns.

## 2023-10-20 NOTE — Death Summary Note (Signed)
DEATH SUMMARY   Patient Details  Name: Sara Mcdonald MRN: [id number] DOB: 1946-04-01 OZH:[id number], Hardin Negus, MD Admission/Discharge Information   Admit Date:  10/28/23  Date of Death: Date of Death: October 31, 2023  Time of Death: Time of Death: 1147  Length of Stay: 3   Principle Cause of death: Circulatory shock, acute leukemia with blast  Hospital Diagnoses: Principal Problem:   Shock circulatory [facility])   Hospital Course: ICU transfer for 10-31-2023.   Sara Mcdonald is a 77 y.o. female  with a past medical history of paroxysmal atrial fibrillation, HFpEF (EF 65-70% with CHAD2DS2-VASc score of 5, Eliquis), HTN, HLD, T2DM, OSA who presented to the ED on 10-28-2023 for shortness of breath and chest pain.  Patient also has some associated fever, chills and productive cough with yellow sputum.  On arrival to ED she was hypoxic initially requiring nonrebreather.  CTA was negative for PE, concern of left lower lobe infiltrate and some pulmonary congestion, small bilateral pleural effusion.  Labs pertinent for Na 134, K 3.3, Cr 1.48, Mg 1.7, GFR 36, BNP 505.4, LDH 258, Lactic 1.3, Procal 6.6, WBC 24.7, Hgb 7.9, Platelets 59. Peripheral smear shows circulating blasts. Fibrinogen 604. Troponin trended flat 36 > 155 > 445 > 496 > 600 > 602 > 594.  Patient was later started on IV pressors for concern of circulatory shock and admitted to ICU. Started on antibiotics for pneumonia.  COVID-19, influenza negative.  CT abdomen and pelvis was negative for any acute intra-abdominal or pelvic abnormality  Initially able to wean off from pressors the next day but required to go back on them again overnight, also developed A-fib with RVR and started on amiodarone infusion, cardiology was also consulted.  Oncology was consulted for concern of circulating blast and worsening leukocytosis, started on Hydrea 1 g daily.  It was seem unlikely to have leukostasis as WBC peaked at 47,000, that reaction is mostly  seen with counts more than 100,000.  Concern of acute myeloid leukemia, flow cytometry was sent and plan was to do a bone marrow biopsy once her condition stabilizes. Per oncology note if the acute myeloid leukemia diagnosis confirmed she might be a candidate for hypomethylating agent.  She will not be a candidate for intensive induction chemotherapy or bone marrow transplant based on her significant underlying comorbidities. Based on that she had poor prognosis, if decided to proceed with hospice then estimated life expectancy days to weeks and even with that treatment maximum up to a year.  Patient continued to deteriorate requiring more pressors.  Palliative care was also consulted and family decided to withdraw medical treatment and proceed with full comfort care.  Care was transferred to [facility] on October 31, 2023.  10/31/23: Patient was started on Dilaudid infusion at family request this morning.  She was mostly unresponsive with some intermittent apnea when seen during morning rounds.  Later around 11:30 AM she developed agonal breathing and then died with the presence of multiple family members at bedside.  She was pronounced dead at 11:47 AM.   Procedures: None  Consultations: PCCM.  Oncology.  Palliative care  The results of significant diagnostics from this hospitalization (including imaging, microbiology, ancillary and laboratory) are listed below for reference.   Significant Diagnostic Studies: DG Chest 1 View  Result Date: 10/07/2023 CLINICAL DATA:  Shortness of breath EXAM: CHEST  1 VIEW COMPARISON:  10/06/2023 FINDINGS: Cardiac enlargement and vascular pedicle widening. Small left pleural effusion. Opacification in the retrocardiac lung primarily attributed to atelectasis  and pleural fluid by recent CT. No pneumothorax. IMPRESSION: Unchanged retrocardiac opacity from atelectasis and pleural fluid by recent CT. Electronically Signed   By: Tiburcio Pea M.D.   On: 10/07/2023 06:19    ECHOCARDIOGRAM COMPLETE  Result Date: 10/06/2023    ECHOCARDIOGRAM REPORT   Patient Name:   Sara Mcdonald Sara Mcdonald Date of Exam: 10/06/2023 Medical Rec #:  [id number]        Height:       62.0 in Accession #:    [id number]       Weight:       161.2 lb Date of Birth:  05-25-1946        BSA:          1.744 m Patient Age:    77 years         BP:           136/59 mmHg Patient Gender: F                HR:           119 bpm. Exam Location:  [facility] Procedure: 2D Echo, Cardiac Doppler and Color Doppler Indications:     Elevated Troponin  History:         Patient has prior history of Echocardiogram examinations, most                  recent 07/30/2022. COPD, Arrythmias:Tachycardia; Risk                  Factors:Hypertension, Diabetes and Sleep Apnea.  Sonographer:     Mikki Harbor Referring Phys:  [id number] BRITTON L RUST-CHESTER Diagnosing Phys: Rozell Searing Custovic IMPRESSIONS  1. Left ventricular ejection fraction, by estimation, is 65 to 70%. Left ventricular ejection fraction by PLAX is 73 %. The left ventricle has normal function. The left ventricle has no regional wall motion abnormalities. Left ventricular diastolic parameters are consistent with Grade II diastolic dysfunction (pseudonormalization).  2. Right ventricular systolic function is normal. The right ventricular size is mildly enlarged. There is severely elevated pulmonary artery systolic pressure. The estimated right ventricular systolic pressure is 67.3 mmHg.  3. Given severely elevated pulmonary pressures, cardiac tamponade may be masked in this patient. Moderate pericardial effusion. The pericardial effusion is circumferential.  4. The mitral valve is grossly normal. Trivial mitral valve regurgitation.  5. The aortic valve is grossly normal. Aortic valve regurgitation is not visualized. No aortic stenosis is present. FINDINGS  Left Ventricle: Left ventricular ejection fraction, by estimation, is 65 to 70%. Left ventricular ejection fraction by PLAX is 73 %.  The left ventricle has normal function. The left ventricle has no regional wall motion abnormalities. The left ventricular internal cavity size was normal in size. There is no left ventricular hypertrophy. Left ventricular diastolic parameters are consistent with Grade II diastolic dysfunction (pseudonormalization). Right Ventricle: The right ventricular size is mildly enlarged. No increase in right ventricular wall thickness. Right ventricular systolic function is normal. There is severely elevated pulmonary artery systolic pressure. The tricuspid regurgitant velocity is 3.85 m/s, and with an assumed right atrial pressure of 8 mmHg, the estimated right ventricular systolic pressure is 67.3 mmHg. Left Atrium: Left atrial size was normal in size. Right Atrium: Right atrial size was normal in size. Pericardium: Given severely elevated pulmonary pressures, cardiac tamponade may be masked in this patient. A moderately sized pericardial effusion is present. The pericardial effusion is circumferential. There is excessive respiratory variation in the mitral valve spectral Doppler velocities.  Mitral Valve: The mitral valve is grossly normal. Trivial mitral valve regurgitation. MV peak gradient, 9.2 mmHg. The mean mitral valve gradient is 5.0 mmHg. Tricuspid Valve: The tricuspid valve is grossly normal. Tricuspid valve regurgitation is mild. Aortic Valve: The aortic valve is grossly normal. Aortic valve regurgitation is not visualized. No aortic stenosis is present. Aortic valve mean gradient measures 14.0 mmHg. Aortic valve peak gradient measures 29.8 mmHg. Aortic valve area, by VTI measures 1.79 cm. Pulmonic Valve: The pulmonic valve was grossly normal. Pulmonic valve regurgitation is trivial. Aorta: The aortic root is normal in size and structure. IAS/Shunts: No atrial level shunt detected by color flow Doppler.  LEFT VENTRICLE PLAX 2D LV EF:         Left            Diastology                ventricular     LV e' medial:     7.40 cm/s                ejection        LV E/e' medial:  17.2                fraction by     LV e' lateral:   8.27 cm/s                PLAX is 73      LV E/e' lateral: 15.4                %. LVIDd:         4.30 cm LVIDs:         2.50 cm LV PW:         1.10 cm LV IVS:        1.10 cm LVOT diam:     1.90 cm LV SV:         78 LV SV Index:   45 LVOT Area:     2.84 cm  RIGHT VENTRICLE RV Basal diam:  3.00 cm RV Mid diam:    2.80 cm RV S prime:     19.30 cm/s LEFT ATRIUM             Index        RIGHT ATRIUM           Index LA diam:        4.40 cm 2.52 cm/m   RA Area:     10.50 cm LA Vol (A2C):   39.2 ml 22.48 ml/m  RA Volume:   17.60 ml  10.09 ml/m LA Vol (A4C):   38.6 ml 22.13 ml/m LA Biplane Vol: 41.1 ml 23.57 ml/m  AORTIC VALVE                     PULMONIC VALVE AV Area (Vmax):    1.87 cm      PV Vmax:       1.19 m/s AV Area (Vmean):   1.77 cm      PV Peak grad:  5.7 mmHg AV Area (VTI):     1.79 cm AV Vmax:           273.00 cm/s AV Vmean:          168.000 cm/s AV VTI:            0.436 m AV Peak Grad:      29.8 mmHg AV Mean Grad:  14.0 mmHg LVOT Vmax:         180.00 cm/s LVOT Vmean:        105.000 cm/s LVOT VTI:          0.276 m LVOT/AV VTI ratio: 0.63  AORTA Ao Root diam: 2.90 cm MITRAL VALVE                TRICUSPID VALVE MV Area (PHT): 3.33 cm     TR Peak grad:   59.3 mmHg MV Area VTI:   2.40 cm     TR Vmax:        385.00 cm/s MV Peak grad:  9.2 mmHg MV Mean grad:  5.0 mmHg     SHUNTS MV Vmax:       1.52 m/s     Systemic VTI:  0.28 m MV Vmean:      108.0 cm/s   Systemic Diam: 1.90 cm MV Decel Time: 228 msec MV E velocity: 127.00 cm/s MV A velocity: 101.00 cm/s MV E/A ratio:  1.26 Sabina Custovic Electronically signed by Clotilde Dieter Signature Date/Time: 10/06/2023/11:31:58 AM    Final    DG Chest Port 1 View  Result Date: 10/06/2023 CLINICAL DATA:  Shortness of breath EXAM: PORTABLE CHEST 1 VIEW COMPARISON:  01-Nov-2023 FINDINGS: Shallow inspiration. Cardiac enlargement. Prominent perihilar  shadows particularly on the right. This appears to correspond to prominent vascularity and hilar lymph nodes on prior CT. Hazy infiltrates are suggested in the left base with small left pleural effusion. No pneumothorax. IMPRESSION: 1. Cardiac enlargement. 2. Prominent hilar structures likely corresponding to vascularity and lymph nodes on prior CT. 3. Hazy infiltration in the left lung base with small left pleural effusion. Electronically Signed   By: Burman Nieves M.D.   On: 10/06/2023 03:05   CT Angio Chest PE W and/or Wo Contrast  Result Date: 2023/11/01 CLINICAL DATA:  Shortness of breath EXAM: CT ANGIOGRAPHY CHEST CT ABDOMEN AND PELVIS WITH CONTRAST TECHNIQUE: Multidetector CT imaging of the chest was performed using the standard protocol during bolus administration of intravenous contrast. Multiplanar CT image reconstructions and MIPs were obtained to evaluate the vascular anatomy. Multidetector CT imaging of the abdomen and pelvis was performed using the standard protocol during bolus administration of intravenous contrast. RADIATION DOSE REDUCTION: This exam was performed according to the departmental dose-optimization program which includes automated exposure control, adjustment of the mA and/or kV according to patient size and/or use of iterative reconstruction technique. CONTRAST:  80mL OMNIPAQUE IOHEXOL 350 MG/ML SOLN COMPARISON:  Chest x-ray November 01, 2023, CT 07/29/2022, 07/24/2022 FINDINGS: CTA CHEST FINDINGS Cardiovascular: Satisfactory opacification of the pulmonary arteries to the segmental level. No evidence of pulmonary embolism. Mild atherosclerosis. No aneurysm. Dilated pulmonary trunk up to 3.6 cm. Cardiomegaly. Small pericardial effusion. Mediastinum/Nodes: Midline trachea. No thyroid mass. Mildly enlarged low right paratracheal node measuring 14 mm. Other subcentimeter mediastinal lymph nodes. Esophagus within normal limits. Lungs/Pleura: Small left greater than right pleural effusions.  Mild diffuse bilateral ground-glass density. No pneumothorax Musculoskeletal: Sternum is intact.  No acute osseous abnormality. Review of the MIP images confirms the above findings. CT ABDOMEN and PELVIS FINDINGS Hepatobiliary: Status post cholecystectomy. No focal hepatic abnormality or significant biliary dilatation. Pancreas: Unremarkable. No pancreatic ductal dilatation or surrounding inflammatory changes. Spleen: Normal in size without focal abnormality. Adrenals/Urinary Tract: Adrenal glands are normal. Prominent right extrarenal pelvis. No hydronephrosis. The bladder is normal Stomach/Bowel: Stomach nonenlarged. No dilated small bowel. No acute bowel wall thickening. Diverticular disease of the left colon Vascular/Lymphatic: Moderate  aortic atherosclerosis. No aneurysm. No suspicious lymph nodes Reproductive: Hysterectomy.  No adnexal mass Other: Negative for pelvic effusion or free air. Musculoskeletal: Posterior spinal fusion hardware at L4-L5. No acute osseous abnormality. Review of the MIP images confirms the above findings. IMPRESSION: 1. Negative for acute pulmonary embolus. 2. Cardiomegaly with small left greater than right pleural effusions. Mild diffuse mosaicism which could be due to small airways disease. Low-grade edema could also be considered. 3. No CT evidence for acute intra-abdominal or pelvic abnormality Electronically Signed   By: Jasmine Pang M.D.   On: Oct 27, 2023 20:25   CT ABDOMEN PELVIS W CONTRAST  Result Date: 2023-10-27 CLINICAL DATA:  Shortness of breath EXAM: CT ANGIOGRAPHY CHEST CT ABDOMEN AND PELVIS WITH CONTRAST TECHNIQUE: Multidetector CT imaging of the chest was performed using the standard protocol during bolus administration of intravenous contrast. Multiplanar CT image reconstructions and MIPs were obtained to evaluate the vascular anatomy. Multidetector CT imaging of the abdomen and pelvis was performed using the standard protocol during bolus administration of  intravenous contrast. RADIATION DOSE REDUCTION: This exam was performed according to the departmental dose-optimization program which includes automated exposure control, adjustment of the mA and/or kV according to patient size and/or use of iterative reconstruction technique. CONTRAST:  80mL OMNIPAQUE IOHEXOL 350 MG/ML SOLN COMPARISON:  Chest x-ray 10-27-23, CT 07/29/2022, 07/24/2022 FINDINGS: CTA CHEST FINDINGS Cardiovascular: Satisfactory opacification of the pulmonary arteries to the segmental level. No evidence of pulmonary embolism. Mild atherosclerosis. No aneurysm. Dilated pulmonary trunk up to 3.6 cm. Cardiomegaly. Small pericardial effusion. Mediastinum/Nodes: Midline trachea. No thyroid mass. Mildly enlarged low right paratracheal node measuring 14 mm. Other subcentimeter mediastinal lymph nodes. Esophagus within normal limits. Lungs/Pleura: Small left greater than right pleural effusions. Mild diffuse bilateral ground-glass density. No pneumothorax Musculoskeletal: Sternum is intact.  No acute osseous abnormality. Review of the MIP images confirms the above findings. CT ABDOMEN and PELVIS FINDINGS Hepatobiliary: Status post cholecystectomy. No focal hepatic abnormality or significant biliary dilatation. Pancreas: Unremarkable. No pancreatic ductal dilatation or surrounding inflammatory changes. Spleen: Normal in size without focal abnormality. Adrenals/Urinary Tract: Adrenal glands are normal. Prominent right extrarenal pelvis. No hydronephrosis. The bladder is normal Stomach/Bowel: Stomach nonenlarged. No dilated small bowel. No acute bowel wall thickening. Diverticular disease of the left colon Vascular/Lymphatic: Moderate aortic atherosclerosis. No aneurysm. No suspicious lymph nodes Reproductive: Hysterectomy.  No adnexal mass Other: Negative for pelvic effusion or free air. Musculoskeletal: Posterior spinal fusion hardware at L4-L5. No acute osseous abnormality. Review of the MIP images confirms  the above findings. IMPRESSION: 1. Negative for acute pulmonary embolus. 2. Cardiomegaly with small left greater than right pleural effusions. Mild diffuse mosaicism which could be due to small airways disease. Low-grade edema could also be considered. 3. No CT evidence for acute intra-abdominal or pelvic abnormality Electronically Signed   By: Jasmine Pang M.D.   On: Oct 27, 2023 20:25   DG Chest Port 1 View  Result Date: 10/27/23 CLINICAL DATA:  Shortness of breath EXAM: PORTABLE CHEST 1 VIEW COMPARISON:  08/03/2022 FINDINGS: Cardiomegaly with mild central congestion and probable small pleural effusions. No pneumothorax. IMPRESSION: Cardiomegaly with mild central congestion and probable small pleural effusions. Electronically Signed   By: Jasmine Pang M.D.   On: Oct 27, 2023 18:36    Microbiology: Recent Results (from the past 240 hour(s))  Resp panel by RT-PCR (RSV, Flu A&B, Covid) Anterior Nasal Swab     Status: None   Collection Time: 10-27-23  5:27 PM   Specimen: Anterior Nasal Swab  Result Value Ref Range Status   SARS Coronavirus 2 by RT PCR NEGATIVE NEGATIVE Final    Comment: (NOTE) SARS-CoV-2 target nucleic acids are NOT DETECTED.  The SARS-CoV-2 RNA is generally detectable in upper respiratory specimens during the acute phase of infection. The lowest concentration of SARS-CoV-2 viral copies this assay can detect is 138 copies/mL. A negative result does not preclude SARS-Cov-2 infection and should not be used as the sole basis for treatment or other patient management decisions. A negative result may occur with  improper specimen collection/handling, submission of specimen other than nasopharyngeal swab, presence of viral mutation(s) within the areas targeted by this assay, and inadequate number of viral copies(<138 copies/mL). A negative result must be combined with clinical observations, patient history, and epidemiological information. The expected result is Negative.  Fact  Sheet for Patients:  BloggerCourse.com  Fact Sheet for Healthcare Providers:  SeriousBroker.it  This test is no t yet approved or cleared by the [place] FDA and  has been authorized for detection and/or diagnosis of SARS-CoV-2 by FDA under an Emergency Use Authorization (EUA). This EUA will remain  in effect (meaning this test can be used) for the duration of the COVID-19 declaration under Section 564(b)(1) of the Act, 21 U.S.C.section 360bbb-3(b)(1), unless the authorization is terminated  or revoked sooner.       Influenza A by PCR NEGATIVE NEGATIVE Final   Influenza B by PCR NEGATIVE NEGATIVE Final    Comment: (NOTE) The Xpert Xpress SARS-CoV-2/FLU/RSV plus assay is intended as an aid in the diagnosis of influenza from Nasopharyngeal swab specimens and should not be used as a sole basis for treatment. Nasal washings and aspirates are unacceptable for Xpert Xpress SARS-CoV-2/FLU/RSV testing.  Fact Sheet for Patients: BloggerCourse.com  Fact Sheet for Healthcare Providers: SeriousBroker.it  This test is not yet approved or cleared by the [place] FDA and has been authorized for detection and/or diagnosis of SARS-CoV-2 by FDA under an Emergency Use Authorization (EUA). This EUA will remain in effect (meaning this test can be used) for the duration of the COVID-19 declaration under Section 564(b)(1) of the Act, 21 U.S.C. section 360bbb-3(b)(1), unless the authorization is terminated or revoked.     Resp Syncytial Virus by PCR NEGATIVE NEGATIVE Final    Comment: (NOTE) Fact Sheet for Patients: BloggerCourse.com  Fact Sheet for Healthcare Providers: SeriousBroker.it  This test is not yet approved or cleared by the [place] FDA and has been authorized for detection and/or diagnosis of SARS-CoV-2 by FDA under an  Emergency Use Authorization (EUA). This EUA will remain in effect (meaning this test can be used) for the duration of the COVID-19 declaration under Section 564(b)(1) of the Act, 21 U.S.C. section 360bbb-3(b)(1), unless the authorization is terminated or revoked.  Performed at [facility], [address] [place], [place] [address]   Blood Culture (routine x 2)     Status: None (Preliminary result)   Collection Time: 10/11/2023  5:27 PM   Specimen: BLOOD  Result Value Ref Range Status   Specimen Description BLOOD BLOOD LEFT ARM  Final   Special Requests   Final    BOTTLES DRAWN AEROBIC AND ANAEROBIC Blood Culture adequate volume   Culture   Final    NO GROWTH 3 DAYS Performed at [facility], [address], [place], [place] [address]    Report Status PENDING  Incomplete  Blood Culture (routine x 2)     Status: None (Preliminary result)   Collection Time: 09/23/2023  5:56 PM   Specimen: BLOOD  Result Value Ref Range Status   Specimen Description BLOOD BLOOD RIGHT ARM  Final   Special Requests   Final    BOTTLES DRAWN AEROBIC AND ANAEROBIC Blood Culture adequate volume   Culture   Final    NO GROWTH 3 DAYS Performed at [facility], [address] [place], [place] [address]    Report Status PENDING  Incomplete  MRSA Next Gen by PCR, Nasal     Status: None   Collection Time: 10/06/23  5:09 AM   Specimen: Nasal Mucosa; Nasal Swab  Result Value Ref Range Status   MRSA by PCR Next Gen NOT DETECTED NOT DETECTED Final    Comment: (NOTE) The GeneXpert MRSA Assay (FDA approved for NASAL specimens only), is one component of a comprehensive MRSA colonization surveillance program. It is not intended to diagnose MRSA infection nor to guide or monitor treatment for MRSA infections. Test performance is not FDA approved in patients less than 59 years old. Performed at [facility], [address] [place], [place] [address]     Time spent: More  than 30 minutes  This record has been created using Conservation officer, historic buildings. Errors have been sought and corrected,but may not always be located. Such creation errors do not reflect on the standard of care.   Signed: Arnetha Courser, MD Oct 20, 2023

## 2023-10-20 NOTE — Plan of Care (Signed)

## 2023-10-20 DEATH — deceased

## 2023-10-27 LAB — MISC LABCORP TEST (SEND OUT): Labcorp test code: 511834
